# Patient Record
Sex: Female | Born: 1945
Health system: Southern US, Community
[De-identification: ages and names within clinical notes are randomized; demographics above are authoritative.]

## PROBLEM LIST (undated history)

## (undated) DIAGNOSIS — I341 Nonrheumatic mitral (valve) prolapse: Secondary | ICD-10-CM

## (undated) DIAGNOSIS — M069 Rheumatoid arthritis, unspecified: Secondary | ICD-10-CM

## (undated) DIAGNOSIS — B029 Zoster without complications: Secondary | ICD-10-CM

## (undated) DIAGNOSIS — E039 Hypothyroidism, unspecified: Secondary | ICD-10-CM

## (undated) DIAGNOSIS — C9110 Chronic lymphocytic leukemia of B-cell type not having achieved remission: Secondary | ICD-10-CM

## (undated) DIAGNOSIS — I10 Essential (primary) hypertension: Secondary | ICD-10-CM

## (undated) HISTORY — PX: ABDOMINAL HYSTERECTOMY: SHX81

## (undated) HISTORY — DX: Hypothyroidism, unspecified: E03.9

## (undated) HISTORY — DX: Nonrheumatic mitral (valve) prolapse: I34.1

## (undated) HISTORY — PX: TONSILLECTOMY: SUR1361

## (undated) HISTORY — DX: Essential (primary) hypertension: I10

---

## 2010-05-19 DIAGNOSIS — J329 Chronic sinusitis, unspecified: Secondary | ICD-10-CM | POA: Insufficient documentation

## 2011-03-31 DIAGNOSIS — M5430 Sciatica, unspecified side: Secondary | ICD-10-CM | POA: Insufficient documentation

## 2012-01-31 DIAGNOSIS — L304 Erythema intertrigo: Secondary | ICD-10-CM | POA: Insufficient documentation

## 2012-10-14 DIAGNOSIS — R6884 Jaw pain: Secondary | ICD-10-CM | POA: Insufficient documentation

## 2014-03-19 DIAGNOSIS — E782 Mixed hyperlipidemia: Secondary | ICD-10-CM | POA: Diagnosis present

## 2015-09-09 DIAGNOSIS — J8410 Pulmonary fibrosis, unspecified: Secondary | ICD-10-CM | POA: Insufficient documentation

## 2016-08-09 DIAGNOSIS — R7989 Other specified abnormal findings of blood chemistry: Secondary | ICD-10-CM | POA: Insufficient documentation

## 2016-08-16 DIAGNOSIS — K5901 Slow transit constipation: Secondary | ICD-10-CM | POA: Insufficient documentation

## 2017-01-29 DIAGNOSIS — E039 Hypothyroidism, unspecified: Secondary | ICD-10-CM | POA: Diagnosis not present

## 2017-01-29 DIAGNOSIS — M7989 Other specified soft tissue disorders: Secondary | ICD-10-CM | POA: Diagnosis not present

## 2017-01-29 DIAGNOSIS — Z23 Encounter for immunization: Secondary | ICD-10-CM | POA: Diagnosis not present

## 2017-01-29 DIAGNOSIS — M069 Rheumatoid arthritis, unspecified: Secondary | ICD-10-CM | POA: Diagnosis not present

## 2017-02-21 DIAGNOSIS — Z6826 Body mass index (BMI) 26.0-26.9, adult: Secondary | ICD-10-CM | POA: Diagnosis not present

## 2017-02-21 DIAGNOSIS — E663 Overweight: Secondary | ICD-10-CM | POA: Diagnosis not present

## 2017-02-21 DIAGNOSIS — R5383 Other fatigue: Secondary | ICD-10-CM | POA: Diagnosis not present

## 2017-02-21 DIAGNOSIS — M069 Rheumatoid arthritis, unspecified: Secondary | ICD-10-CM | POA: Diagnosis not present

## 2017-02-21 DIAGNOSIS — M7989 Other specified soft tissue disorders: Secondary | ICD-10-CM | POA: Diagnosis not present

## 2017-03-25 DIAGNOSIS — M25561 Pain in right knee: Secondary | ICD-10-CM | POA: Diagnosis not present

## 2017-05-07 DIAGNOSIS — L659 Nonscarring hair loss, unspecified: Secondary | ICD-10-CM | POA: Diagnosis not present

## 2017-05-07 DIAGNOSIS — R5383 Other fatigue: Secondary | ICD-10-CM | POA: Diagnosis not present

## 2017-05-07 DIAGNOSIS — Z6827 Body mass index (BMI) 27.0-27.9, adult: Secondary | ICD-10-CM | POA: Diagnosis not present

## 2017-05-07 DIAGNOSIS — M069 Rheumatoid arthritis, unspecified: Secondary | ICD-10-CM | POA: Diagnosis not present

## 2017-05-07 DIAGNOSIS — Z79899 Other long term (current) drug therapy: Secondary | ICD-10-CM | POA: Diagnosis not present

## 2017-05-07 DIAGNOSIS — E663 Overweight: Secondary | ICD-10-CM | POA: Diagnosis not present

## 2017-07-05 ENCOUNTER — Ambulatory Visit: Payer: PPO | Admitting: Podiatry

## 2017-07-05 ENCOUNTER — Encounter: Payer: Self-pay | Admitting: Podiatry

## 2017-07-05 ENCOUNTER — Ambulatory Visit (INDEPENDENT_AMBULATORY_CARE_PROVIDER_SITE_OTHER): Payer: PPO

## 2017-07-05 DIAGNOSIS — M79671 Pain in right foot: Secondary | ICD-10-CM

## 2017-07-05 DIAGNOSIS — R200 Anesthesia of skin: Secondary | ICD-10-CM | POA: Diagnosis not present

## 2017-07-05 DIAGNOSIS — Q828 Other specified congenital malformations of skin: Secondary | ICD-10-CM | POA: Diagnosis not present

## 2017-07-05 DIAGNOSIS — M2142 Flat foot [pes planus] (acquired), left foot: Secondary | ICD-10-CM | POA: Diagnosis not present

## 2017-07-05 DIAGNOSIS — M79672 Pain in left foot: Secondary | ICD-10-CM | POA: Diagnosis not present

## 2017-07-05 DIAGNOSIS — M2141 Flat foot [pes planus] (acquired), right foot: Secondary | ICD-10-CM

## 2017-07-05 NOTE — Progress Notes (Signed)
Subjective:    Patient ID: Nicole Campbell, female    DOB: 14-Sep-1945, 72 y.o.   MRN: [id number]  HPI 72 year old female presents the office today for multiple concerns.  She states that her main concern is she has numbness to her feet most of the right foot which is been gradually getting worse over the last 10 years.  She also feels that it makes it difficult to walk at times given the numbness.  She also gets calluses for the repeat that is been ongoing for several years.  She has a history of injury to the right ankle about 15 years ago.  She also has a flat foot.  She has a history of rheumatoid arthritis and she is currently on methotrexate.  She denies any recent injury to her feet.  She has no other concerns today.   Review of Systems  History reviewed. No pertinent past medical history.  History reviewed. No pertinent surgical history.   Current Outpatient Medications:  .  folic acid (FOLVITE) 1 MG tablet, Take 1 mg by mouth daily., Disp: , Rfl:  .  levothyroxine (SYNTHROID, LEVOTHROID) 25 MCG tablet, , Disp: , Rfl:  .  methotrexate 2.5 MG tablet, , Disp: , Rfl:   Allergies  Allergen Reactions  . Erythromycin     Social History   Socioeconomic History  . Marital status: Unknown    Spouse name: Not on file  . Number of children: Not on file  . Years of education: Not on file  . Highest education level: Not on file  Social Needs  . Financial resource strain: Not on file  . Food insecurity - worry: Not on file  . Food insecurity - inability: Not on file  . Transportation needs - medical: Not on file  . Transportation needs - non-medical: Not on file  Occupational History  . Not on file  Tobacco Use  . Smoking status: Unknown If Ever Smoked  . Smokeless tobacco: Never Used  Substance and Sexual Activity  . Alcohol use: Not on file  . Drug use: Not on file  . Sexual activity: Not on file  Other Topics Concern  . Not on file  Social History Narrative  . Not on  file         Objective:   Physical Exam General: AAO x3, NAD  Dermatological: Hyperkeratotic lesions present bilateral foot.  Upon debridement there is no underlying ulceration, drainage or any signs of infection.  There are no open sores, no preulcerative lesions, no rash or signs of infection present.  Vascular: Dorsalis Pedis artery and Posterior Tibial artery pedal pulses are 2/4 bilateral with immedate capillary fill time. There is no pain with calf compression, swelling, warmth, erythema.   Neruologic: Grossly intact via light touch bilateral.  Sensation appears to be intact with Ernestina Patches monofilament.  There is a negative Tinel sign present bilaterally.  Musculoskeletal: There is a decrease in medial arch upon weightbearing.  Mild equinus is present.  HAV present. There is no area pinpoint bony tenderness or pain to vibratory sensation.  There is no overlying edema, erythema, increase in warmth.  Achilles tendon appears to be intact as well as the ankle ligaments, extensor, flexor tendons.  Muscular strength 5/5 in all groups tested bilateral.  Gait: Unassisted, Nonantalgic.      Assessment & Plan:  72 year old female with bilateral flatfoot deformity resulted in hyperkeratotic lesions; numbness -Treatment options discussed including all alternatives, risks, and complications -Etiology of symptoms were  discussed -X-rays were obtained and reviewed with the patient.  HAV is present.  Heel spurs are present.  There is no evidence of acute fracture or stress fracture identified today. -Hyperkeratotic lesions sharply debrided without complications or bleeding.  -Regards to her overall foot pain, calluses I discussed with her custom orthotics.  She wishes to go ahead and proceed with these.  I will get her back into being molded for the inserts.  She is aware of the cost. -Regards to the numbness to her toes.  We discussed nerve conduction test.  I do not believe that this is a  circulation issue.  Trula Slade DPM

## 2017-07-13 DIAGNOSIS — H1851 Endothelial corneal dystrophy: Secondary | ICD-10-CM | POA: Diagnosis not present

## 2017-07-13 DIAGNOSIS — H2513 Age-related nuclear cataract, bilateral: Secondary | ICD-10-CM | POA: Diagnosis not present

## 2017-07-13 DIAGNOSIS — H35373 Puckering of macula, bilateral: Secondary | ICD-10-CM | POA: Diagnosis not present

## 2017-07-13 DIAGNOSIS — H01024 Squamous blepharitis left upper eyelid: Secondary | ICD-10-CM | POA: Diagnosis not present

## 2017-07-13 DIAGNOSIS — H04123 Dry eye syndrome of bilateral lacrimal glands: Secondary | ICD-10-CM | POA: Diagnosis not present

## 2017-07-13 DIAGNOSIS — H01021 Squamous blepharitis right upper eyelid: Secondary | ICD-10-CM | POA: Diagnosis not present

## 2017-07-13 DIAGNOSIS — H01022 Squamous blepharitis right lower eyelid: Secondary | ICD-10-CM | POA: Diagnosis not present

## 2017-07-13 DIAGNOSIS — H2511 Age-related nuclear cataract, right eye: Secondary | ICD-10-CM | POA: Diagnosis not present

## 2017-07-13 DIAGNOSIS — H01025 Squamous blepharitis left lower eyelid: Secondary | ICD-10-CM | POA: Diagnosis not present

## 2017-07-16 ENCOUNTER — Ambulatory Visit: Payer: PPO | Admitting: Orthotics

## 2017-07-16 DIAGNOSIS — M2141 Flat foot [pes planus] (acquired), right foot: Secondary | ICD-10-CM

## 2017-07-16 DIAGNOSIS — M2142 Flat foot [pes planus] (acquired), left foot: Secondary | ICD-10-CM

## 2017-07-16 DIAGNOSIS — Q828 Other specified congenital malformations of skin: Secondary | ICD-10-CM

## 2017-07-16 NOTE — Progress Notes (Signed)
Patient came into today to be cast for Custom Foot Orthotics. Upon recommendation of Dr. Jacqualyn Posey Patient presents with painful keratomas 2/5th sub met b/l Goals are lw accomodative w/ offloads Plan vendor Rich[employer]   $300

## 2017-07-20 ENCOUNTER — Other Ambulatory Visit: Payer: Self-pay

## 2017-07-20 NOTE — Patient Outreach (Signed)
[facility]) Care Management  07/20/2017  LILLY GASSER [birth year] [id number]   Telephone Screen  Referral Date: 07/20/17 Referral Source: Nurse Call Center Report Referral Reason: " caller states she is supposed to start taking Prednisone tomorrow for RA and wants to know if okay to take ibuprofen tonight for pain and if there will be any interactions between the two" Insurance: HTA   Outreach attempt # 1  to patient. No answer at present. RN CM left HIPAA compliant voicemail message along with contact info.      Plan: RN CM will make outreach attempt to patient within three business days if no return call from patient. RN CM will send unsuccessful outreach letter to patient.    Enzo Montgomery, RN,BSN,CCM [place] [facility] Telephonic Care Management Coordinator Direct Phone: [phone] Toll Free: [phone] Fax: [phone]

## 2017-07-23 ENCOUNTER — Other Ambulatory Visit: Payer: Self-pay

## 2017-07-23 NOTE — Patient Outreach (Signed)
[facility] Care Management  07/23/2017  PAELYN SMICK December 30, 1945 [id number]   Telephone Screen  Referral Date: 07/20/17 Referral Source: Nurse Call Center Report Referral Reason: " caller states she is supposed to start taking Prednisone tomorrow for RA and wants to know if okay to take ibuprofen tonight for pain and if there will be any interactions between the two" Insurance: HTA    Outreach attempt #2 to patient. No answer at present. RN CM left HIPAA compliant voicemail message along with contact info.      Plan: RN CM will make outreach attempt to patient within 3-4 business days. RN CM will send unsuccessful outreach letter to patient.      Enzo Montgomery, RN,BSN,CCM [place] Management Telephonic Care Management Coordinator Direct Phone: [phone] Toll Free: [phone] Fax: [phone]

## 2017-07-23 NOTE — Patient Outreach (Signed)
[facility]) Care Management  07/23/2017  Nicole Campbell 1946-03-02 [id number]    Telephone Screen  Referral Date:07/20/17 Referral Source:Nurse Call Center Report Referral Reason:" caller states she is supposed to start taking Prednisone tomorrow for RA and wants to know if okay to take ibuprofen tonight for pain and if there will be any interactions between the two" Insurance:HTA    Incoming call from patient. Patient reports she is doing and feeling well. She stets that she is taking Prednisone as directed and feels that it is working. She voices the evening that she called the nurse line she took one dose of Ibuprofen which allowed her to rest well and has not taken any more since starting steroids. She has MD appts in place and transportation to get to them. She denies any other issues regarding her meds.She denies any further RN CM issues or concerns at this time. She was very Patent attorney of nurse line 24/7 being available to her. She is aware that she can call the number for any future needs or concerns.       Plan:  RN CM will close case at this time as no further needs.    Enzo Montgomery, RN,BSN,CCM [place] [facility] Telephonic Care Management Coordinator Direct Phone: [phone] Toll Free: [phone] Fax: [phone]

## 2017-08-06 ENCOUNTER — Ambulatory Visit (INDEPENDENT_AMBULATORY_CARE_PROVIDER_SITE_OTHER): Payer: PPO | Admitting: Orthotics

## 2017-08-06 DIAGNOSIS — Q828 Other specified congenital malformations of skin: Secondary | ICD-10-CM

## 2017-08-06 DIAGNOSIS — L659 Nonscarring hair loss, unspecified: Secondary | ICD-10-CM | POA: Diagnosis not present

## 2017-08-06 DIAGNOSIS — M2141 Flat foot [pes planus] (acquired), right foot: Secondary | ICD-10-CM

## 2017-08-06 DIAGNOSIS — Z79899 Other long term (current) drug therapy: Secondary | ICD-10-CM | POA: Diagnosis not present

## 2017-08-06 DIAGNOSIS — Z6827 Body mass index (BMI) 27.0-27.9, adult: Secondary | ICD-10-CM | POA: Diagnosis not present

## 2017-08-06 DIAGNOSIS — M2142 Flat foot [pes planus] (acquired), left foot: Secondary | ICD-10-CM

## 2017-08-06 DIAGNOSIS — E663 Overweight: Secondary | ICD-10-CM | POA: Diagnosis not present

## 2017-08-06 DIAGNOSIS — R5383 Other fatigue: Secondary | ICD-10-CM | POA: Diagnosis not present

## 2017-08-06 DIAGNOSIS — R200 Anesthesia of skin: Secondary | ICD-10-CM

## 2017-08-06 DIAGNOSIS — M069 Rheumatoid arthritis, unspecified: Secondary | ICD-10-CM | POA: Diagnosis not present

## 2017-08-06 NOTE — Progress Notes (Signed)
Patient came in today to pick up custom made foot orthotics.  The goals were accomplished and the patient reported no dissatisfaction with said orthotics.  Patient was advised of breakin period and how to report any issues. 

## 2017-08-22 DIAGNOSIS — M859 Disorder of bone density and structure, unspecified: Secondary | ICD-10-CM | POA: Diagnosis not present

## 2017-08-22 DIAGNOSIS — Z79899 Other long term (current) drug therapy: Secondary | ICD-10-CM | POA: Diagnosis not present

## 2017-08-22 DIAGNOSIS — Z1389 Encounter for screening for other disorder: Secondary | ICD-10-CM | POA: Diagnosis not present

## 2017-08-22 DIAGNOSIS — M069 Rheumatoid arthritis, unspecified: Secondary | ICD-10-CM | POA: Diagnosis not present

## 2017-08-22 DIAGNOSIS — E039 Hypothyroidism, unspecified: Secondary | ICD-10-CM | POA: Diagnosis not present

## 2017-08-22 DIAGNOSIS — Z0000 Encounter for general adult medical examination without abnormal findings: Secondary | ICD-10-CM | POA: Diagnosis not present

## 2017-08-23 ENCOUNTER — Other Ambulatory Visit: Payer: Self-pay | Admitting: Family Medicine

## 2017-08-23 DIAGNOSIS — Z1231 Encounter for screening mammogram for malignant neoplasm of breast: Secondary | ICD-10-CM

## 2017-08-24 ENCOUNTER — Other Ambulatory Visit: Payer: Self-pay | Admitting: Family Medicine

## 2017-08-28 ENCOUNTER — Other Ambulatory Visit: Payer: Self-pay | Admitting: Family Medicine

## 2017-08-28 DIAGNOSIS — E2839 Other primary ovarian failure: Secondary | ICD-10-CM

## 2017-09-12 DIAGNOSIS — H2511 Age-related nuclear cataract, right eye: Secondary | ICD-10-CM | POA: Diagnosis not present

## 2017-09-12 DIAGNOSIS — H25811 Combined forms of age-related cataract, right eye: Secondary | ICD-10-CM | POA: Diagnosis not present

## 2017-09-17 DIAGNOSIS — M069 Rheumatoid arthritis, unspecified: Secondary | ICD-10-CM | POA: Diagnosis not present

## 2017-09-19 ENCOUNTER — Ambulatory Visit: Payer: PPO

## 2017-10-05 DIAGNOSIS — M069 Rheumatoid arthritis, unspecified: Secondary | ICD-10-CM | POA: Diagnosis not present

## 2017-10-05 DIAGNOSIS — M25512 Pain in left shoulder: Secondary | ICD-10-CM | POA: Diagnosis not present

## 2017-10-05 DIAGNOSIS — L659 Nonscarring hair loss, unspecified: Secondary | ICD-10-CM | POA: Diagnosis not present

## 2017-10-05 DIAGNOSIS — R5383 Other fatigue: Secondary | ICD-10-CM | POA: Diagnosis not present

## 2017-10-05 DIAGNOSIS — Z6827 Body mass index (BMI) 27.0-27.9, adult: Secondary | ICD-10-CM | POA: Diagnosis not present

## 2017-10-05 DIAGNOSIS — Z79899 Other long term (current) drug therapy: Secondary | ICD-10-CM | POA: Diagnosis not present

## 2017-10-05 DIAGNOSIS — E663 Overweight: Secondary | ICD-10-CM | POA: Diagnosis not present

## 2017-10-18 ENCOUNTER — Ambulatory Visit
Admission: RE | Admit: 2017-10-18 | Discharge: 2017-10-18 | Disposition: A | Discharge status: Home / Self Care | Payer: PPO | Source: Ambulatory Visit | Attending: Family Medicine | Admitting: Family Medicine

## 2017-10-18 DIAGNOSIS — Z780 Asymptomatic menopausal state: Secondary | ICD-10-CM | POA: Diagnosis not present

## 2017-10-18 DIAGNOSIS — Z1231 Encounter for screening mammogram for malignant neoplasm of breast: Secondary | ICD-10-CM

## 2017-10-18 DIAGNOSIS — Z13820 Encounter for screening for osteoporosis: Secondary | ICD-10-CM | POA: Diagnosis not present

## 2017-10-18 DIAGNOSIS — E2839 Other primary ovarian failure: Secondary | ICD-10-CM

## 2017-11-02 DIAGNOSIS — H2512 Age-related nuclear cataract, left eye: Secondary | ICD-10-CM | POA: Diagnosis not present

## 2017-11-05 DIAGNOSIS — M069 Rheumatoid arthritis, unspecified: Secondary | ICD-10-CM | POA: Diagnosis not present

## 2017-11-07 DIAGNOSIS — H2512 Age-related nuclear cataract, left eye: Secondary | ICD-10-CM | POA: Diagnosis not present

## 2017-11-07 DIAGNOSIS — H25812 Combined forms of age-related cataract, left eye: Secondary | ICD-10-CM | POA: Diagnosis not present

## 2017-11-12 DIAGNOSIS — M069 Rheumatoid arthritis, unspecified: Secondary | ICD-10-CM | POA: Diagnosis not present

## 2017-12-03 DIAGNOSIS — M25561 Pain in right knee: Secondary | ICD-10-CM | POA: Diagnosis not present

## 2017-12-03 DIAGNOSIS — M25562 Pain in left knee: Secondary | ICD-10-CM | POA: Diagnosis not present

## 2017-12-17 DIAGNOSIS — L659 Nonscarring hair loss, unspecified: Secondary | ICD-10-CM | POA: Diagnosis not present

## 2017-12-17 DIAGNOSIS — R350 Frequency of micturition: Secondary | ICD-10-CM | POA: Diagnosis not present

## 2017-12-17 DIAGNOSIS — M25512 Pain in left shoulder: Secondary | ICD-10-CM | POA: Diagnosis not present

## 2017-12-17 DIAGNOSIS — Z6827 Body mass index (BMI) 27.0-27.9, adult: Secondary | ICD-10-CM | POA: Diagnosis not present

## 2017-12-17 DIAGNOSIS — R5383 Other fatigue: Secondary | ICD-10-CM | POA: Diagnosis not present

## 2017-12-17 DIAGNOSIS — Z79899 Other long term (current) drug therapy: Secondary | ICD-10-CM | POA: Diagnosis not present

## 2017-12-17 DIAGNOSIS — M069 Rheumatoid arthritis, unspecified: Secondary | ICD-10-CM | POA: Diagnosis not present

## 2017-12-17 DIAGNOSIS — E663 Overweight: Secondary | ICD-10-CM | POA: Diagnosis not present

## 2018-01-17 DIAGNOSIS — K648 Other hemorrhoids: Secondary | ICD-10-CM | POA: Diagnosis not present

## 2018-01-28 DIAGNOSIS — M25511 Pain in right shoulder: Secondary | ICD-10-CM | POA: Diagnosis not present

## 2018-01-28 DIAGNOSIS — M25512 Pain in left shoulder: Secondary | ICD-10-CM | POA: Diagnosis not present

## 2018-02-12 DIAGNOSIS — R5383 Other fatigue: Secondary | ICD-10-CM | POA: Diagnosis not present

## 2018-02-12 DIAGNOSIS — R350 Frequency of micturition: Secondary | ICD-10-CM | POA: Diagnosis not present

## 2018-02-12 DIAGNOSIS — M069 Rheumatoid arthritis, unspecified: Secondary | ICD-10-CM | POA: Diagnosis not present

## 2018-02-12 DIAGNOSIS — M25512 Pain in left shoulder: Secondary | ICD-10-CM | POA: Diagnosis not present

## 2018-02-12 DIAGNOSIS — Z79899 Other long term (current) drug therapy: Secondary | ICD-10-CM | POA: Diagnosis not present

## 2018-02-12 DIAGNOSIS — E663 Overweight: Secondary | ICD-10-CM | POA: Diagnosis not present

## 2018-02-12 DIAGNOSIS — Z6827 Body mass index (BMI) 27.0-27.9, adult: Secondary | ICD-10-CM | POA: Diagnosis not present

## 2018-02-12 DIAGNOSIS — L659 Nonscarring hair loss, unspecified: Secondary | ICD-10-CM | POA: Diagnosis not present

## 2018-02-27 DIAGNOSIS — M069 Rheumatoid arthritis, unspecified: Secondary | ICD-10-CM | POA: Diagnosis not present

## 2018-03-07 ENCOUNTER — Telehealth: Payer: Self-pay | Admitting: Oncology

## 2018-03-07 ENCOUNTER — Encounter: Payer: Self-pay | Admitting: Oncology

## 2018-03-07 NOTE — Telephone Encounter (Signed)
New referral received from [facility] Rheumat[facility]. Pt returned my call to schedule an appt. An appt has been scheduled for the pt to see Dr. Alen Blew on 11/22 at 11am. Pt aware to arrive 30 minutes early. Letter mailed.

## 2018-03-18 DIAGNOSIS — E039 Hypothyroidism, unspecified: Secondary | ICD-10-CM | POA: Diagnosis not present

## 2018-03-22 ENCOUNTER — Inpatient Hospital Stay: Payer: PPO | Attending: Oncology | Admitting: Oncology

## 2018-03-22 ENCOUNTER — Inpatient Hospital Stay: Payer: PPO

## 2018-03-22 ENCOUNTER — Telehealth: Payer: Self-pay | Admitting: Oncology

## 2018-03-22 VITALS — BP 163/75 | HR 110 | Temp 97.9°F | Resp 18 | Ht 67.5 in | Wt 171.1 lb

## 2018-03-22 DIAGNOSIS — M069 Rheumatoid arthritis, unspecified: Secondary | ICD-10-CM | POA: Insufficient documentation

## 2018-03-22 DIAGNOSIS — M25512 Pain in left shoulder: Secondary | ICD-10-CM | POA: Diagnosis not present

## 2018-03-22 DIAGNOSIS — D72820 Lymphocytosis (symptomatic): Secondary | ICD-10-CM | POA: Diagnosis not present

## 2018-03-22 DIAGNOSIS — Z806 Family history of leukemia: Secondary | ICD-10-CM | POA: Diagnosis not present

## 2018-03-22 DIAGNOSIS — C91Z0 Other lymphoid leukemia not having achieved remission: Secondary | ICD-10-CM | POA: Diagnosis not present

## 2018-03-22 DIAGNOSIS — C8590 Non-Hodgkin lymphoma, unspecified, unspecified site: Secondary | ICD-10-CM

## 2018-03-22 DIAGNOSIS — E039 Hypothyroidism, unspecified: Secondary | ICD-10-CM | POA: Insufficient documentation

## 2018-03-22 LAB — CBC WITH DIFFERENTIAL (CANCER CENTER ONLY)
Abs Immature Granulocytes: 0.12 10*3/uL — ABNORMAL HIGH (ref 0.00–0.07)
Basophils Absolute: 0.2 10*3/uL — ABNORMAL HIGH (ref 0.0–0.1)
Basophils Relative: 1 %
Eosinophils Absolute: 0.5 10*3/uL (ref 0.0–0.5)
Eosinophils Relative: 3 %
HCT: 41.2 % (ref 36.0–46.0)
Hemoglobin: 13.1 g/dL (ref 12.0–15.0)
Immature Granulocytes: 1 %
Lymphocytes Relative: 67 %
Lymphs Abs: 12.3 10*3/uL — ABNORMAL HIGH (ref 0.7–4.0)
MCH: 29.6 pg (ref 26.0–34.0)
MCHC: 31.8 g/dL (ref 30.0–36.0)
MCV: 93 fL (ref 80.0–100.0)
Monocytes Absolute: 1 10*3/uL (ref 0.1–1.0)
Monocytes Relative: 5 %
Neutro Abs: 4.1 10*3/uL (ref 1.7–7.7)
Neutrophils Relative %: 23 %
Platelet Count: 285 10*3/uL (ref 150–400)
RBC: 4.43 MIL/uL (ref 3.87–5.11)
RDW: 14.1 % (ref 11.5–15.5)
WBC Count: 18.1 10*3/uL — ABNORMAL HIGH (ref 4.0–10.5)
nRBC: 0 % (ref 0.0–0.2)

## 2018-03-22 LAB — LACTATE DEHYDROGENASE: LDH: 319 U/L — ABNORMAL HIGH (ref 98–192)

## 2018-03-22 LAB — CMP (CANCER CENTER ONLY)
ALT: 25 U/L (ref 0–44)
AST: 25 U/L (ref 15–41)
Albumin: 3.8 g/dL (ref 3.5–5.0)
Alkaline Phosphatase: 85 U/L (ref 38–126)
Anion gap: 10 (ref 5–15)
BUN: 10 mg/dL (ref 8–23)
CO2: 23 mmol/L (ref 22–32)
Calcium: 9.6 mg/dL (ref 8.9–10.3)
Chloride: 107 mmol/L (ref 98–111)
Creatinine: 0.78 mg/dL (ref 0.44–1.00)
GFR, Est AFR Am: >60 mL/min (ref >60)
GFR, Estimated: >60 mL/min (ref >60)
Glucose, Bld: 99 mg/dL (ref 70–99)
Potassium: 3.8 mmol/L (ref 3.5–5.1)
Sodium: 140 mmol/L (ref 135–145)
Total Bilirubin: 0.5 mg/dL (ref 0.3–1.2)
Total Protein: 7.1 g/dL (ref 6.5–8.1)

## 2018-03-22 NOTE — Telephone Encounter (Signed)
Gave pt avs and calendar  °

## 2018-03-22 NOTE — Progress Notes (Signed)
Reason for the request:   Lymphocytosis.  Evaluation for lymphoproliferative disorder.  HPI: I was asked by Marella Chimes, PA-C  to evaluate Nicole Campbell for lymphocytosis.  She is a pleasant 72 year old woman with history of rheumatoid arthritis diagnosed for the last 3 years.  He had few therapies in the past including methotrexate and prednisone.  He establish care after relocating from [facility] with [facility] rheumatology.  She was started on leflunomide around June 2019 and added Olumiant in August 2019.  He had routine labs done throughout this which showed gradual lymphocytosis.  In June 2019 her total white cell count was 10.8 but did have lymphocytosis with 50% lymphs and absolute lymphocyte count of 5.5.  Her total white cell count has increased in the interim with absolute lymphocytosis is up to 16.3.  Her white cell count in July 2019 was 11.6, August 2019 was 17.4, on February 13, 2018 and was 20.7 and on February 28, 2018 was 21.8.  Based on these findings patient referred to me for evaluation.  Remaining laboratory testing was within normal range including normal hemoglobin, platelet count and electrolytes.  With these findings she is completely asymptomatic.  She denies any lymphadenopathy, constitutional symptoms or weight loss.  Her appetite and performance status is excellent.  She continues to have left-sided shoulder pain as well as chronic arthritic pain which has improved with her rheumatoid arthritis medication.  She does not report any headaches, blurry vision, syncope or seizures. Does not report any fevers, chills or sweats.  Does not report any cough, wheezing or hemoptysis.  Does not report any chest pain, palpitation, orthopnea or leg edema.  Does not report any nausea, vomiting or abdominal pain.  Does not report any constipation or diarrhea.  Does not report any skeletal complaints.    Does not report frequency, urgency or hematuria.  Does not report any skin rashes or lesions.  Does not report any heat or cold intolerance.  Does not report any lymphadenopathy or petechiae.  Does not report any anxiety or depression.  Remaining review of systems is negative.    No past medical history on file.:    Past medical history is significant for hypothyroidism and rheumatoid arthritis.  No hypertension, diabetes or coronary disease.   Current Outpatient Medications:  .  levothyroxine (SYNTHROID, LEVOTHROID) 25 MCG tablet, , Disp: , Rfl:  .  PROCTOZONE-HC 2.5 % rectal cream, APPLY CREAM RECTALLY TO AFFECTED AREA THREE TIMES DAILY, Disp: , Rfl: 2:  Allergies  Allergen Reactions  . Erythromycin   :   Her brother was diagnosed with CLL recently.  Social History   Socioeconomic History  . Marital status: Married    Spouse name: Not on file  . Number of children: Not on file  . Years of education: Not on file  . Highest education level: Not on file  Occupational History  . Not on file  Social Needs  . Financial resource strain: Not on file  . Food insecurity:    Worry: Not on file    Inability: Not on file  . Transportation needs:    Medical: Not on file    Non-medical: Not on file  Tobacco Use  . Smoking status: Unknown If Ever Smoked  . Smokeless tobacco: Never Used  Substance and Sexual Activity  . Alcohol use: Not on file  . Drug use: Not on file  . Sexual activity: Not on file  Lifestyle  . Physical activity:    Days per week: Not  on file    Minutes per session: Not on file  . Stress: Not on file  Relationships  . Social connections:    Talks on phone: Not on file    Gets together: Not on file    Attends religious service: Not on file    Active member of club or organization: Not on file    Attends meetings of clubs or organizations: Not on file    Relationship status: Not on file  . Intimate partner violence:    Fear of current or ex partner: Not on file    Emotionally abused: Not on file    Physically abused: Not on file    Forced sexual  activity: Not on file  Other Topics Concern  . Not on file  Social History Narrative  . Not on file  :  Pertinent items are noted in HPI.  Exam: Blood pressure (!) 163/75, pulse (!) 110, temperature 97.9 F (36.6 C), temperature source Oral, resp. rate 18, height 5' 7.5" (1.715 m), weight 171 lb 1.6 oz (77.6 kg), SpO2 98 %.  ECOG 1 General appearance: alert and cooperative appeared without distress. Head: atraumatic without any abnormalities. Eyes: conjunctivae/corneas clear. PERRL.  Sclera anicteric. Throat: lips, mucosa, and tongue normal; without oral thrush or ulcers. Resp: clear to auscultation bilaterally without rhonchi, wheezes or dullness to percussion. Cardio: regular rate and rhythm, S1, S2 normal, no murmur, click, rub or gallop GI: soft, non-tender; bowel sounds normal; no masses,  no organomegaly Skin: Skin color, texture, turgor normal. No rashes or lesions Lymph nodes: Cervical, supraclavicular, and axillary nodes normal. Neurologic: Grossly normal without any motor, sensory or deep tendon reflexes. Musculoskeletal: No joint deformity or effusion.     Assessment and Plan:   72 year old woman with the following:  1.  Lymphocytosis documented starting in June 2019.  Her lymphocyte count was noted to be around 5.5 with the upper limit of normal close to 3.  Her absolute lymphocyte count is a high as 20 in October 2019.  Her white cell count started within normal range and most recently was up to 20,000.  The differential diagnosis was discussed today with the patient today.  Lymphoproliferative disorder is highly likely in this scenario.  Chronic lymphocytic leukemia is the most likely etiology although other possibilities are considered.  These possibilities include mantle cell lymphoma, prolymphocytic leukemia among others.  Reactive lymphocytosis related to drugs are considered less likely at this time.  From a management and work-up standpoint, I will repeat CBC  today as well as peripheral blood flow cytometry.  We will also obtain FISH studies to further risk stratify her CLL if it is indeed as we are dealing with.  I will also obtain imaging studies of the chest abdomen and pelvis to rule out lymphatic spread and lymphoma presentation of her CLL.  Once the studies are completed she will return to me for final diagnosis and management discussion.  The natural course of CLL was reviewed today as the most likely etiology.  Long-term complication associated with this disease including cytopenias, immune dysregulation as well as current infections were reviewed.  Treatment options that include systemic chemotherapy, oral targeted therapy among others were discussed.  We will discuss this further once the diagnosis and the risk stratification has been completed.  2.  Rheumatoid arthritis: She is currently off therapy.  Once the final diagnosis of her hematological condition is characterized I will give a final recommendation regarding this issue.  3.  Follow-up:  We will be in the immediate future after completing the work-up.    Thank you for the referral.  A copy of this consult has been forwarded to the requesting physician.

## 2018-03-26 ENCOUNTER — Encounter (HOSPITAL_COMMUNITY): Payer: Self-pay

## 2018-03-26 ENCOUNTER — Ambulatory Visit (HOSPITAL_COMMUNITY)
Admission: RE | Admit: 2018-03-26 | Discharge: 2018-03-26 | Disposition: A | Discharge status: Home / Self Care | Payer: PPO | Source: Ambulatory Visit | Attending: Oncology | Admitting: Oncology

## 2018-03-26 DIAGNOSIS — C8590 Non-Hodgkin lymphoma, unspecified, unspecified site: Secondary | ICD-10-CM | POA: Diagnosis not present

## 2018-03-26 DIAGNOSIS — D72820 Lymphocytosis (symptomatic): Secondary | ICD-10-CM | POA: Diagnosis not present

## 2018-03-26 MED ORDER — SODIUM CHLORIDE (PF) 0.9 % IJ SOLN
INTRAMUSCULAR | Status: AC
  Filled 2018-03-26: qty 50

## 2018-03-26 MED ORDER — IOHEXOL 300 MG/ML  SOLN
100.0000 mL | Freq: Once | INTRAMUSCULAR | Status: AC | PRN
  Administered 2018-03-26: 100 mL via INTRAVENOUS

## 2018-03-27 LAB — FLOW CYTOMETRY

## 2018-04-02 DIAGNOSIS — Z23 Encounter for immunization: Secondary | ICD-10-CM | POA: Diagnosis not present

## 2018-04-04 ENCOUNTER — Inpatient Hospital Stay: Payer: PPO | Attending: Oncology | Admitting: Oncology

## 2018-04-04 VITALS — BP 151/77 | HR 103 | Temp 97.6°F | Resp 18 | Ht 67.5 in | Wt 174.7 lb

## 2018-04-04 DIAGNOSIS — M069 Rheumatoid arthritis, unspecified: Secondary | ICD-10-CM

## 2018-04-04 DIAGNOSIS — C9110 Chronic lymphocytic leukemia of B-cell type not having achieved remission: Secondary | ICD-10-CM

## 2018-04-04 NOTE — Progress Notes (Signed)
Hematology and Oncology Follow Up Visit  Nicole Campbell [id number] November 25, 1945 72 y.o. 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  She presented with lymphocytosis and lymphadenopathy that is asymptomatic.  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   Current therapy: Active surveillance.  Interim History: Nicole Campbell presents today for a follow-up visit.  She is a pleasant woman I saw in evaluation for lymphocytosis on 03/22/2018.  Her work-up confirmed the presence of CLL although she remains asymptomatic.  Since the last visit, she has not reported any palpable adenopathy, constitutional symptoms or recurrent infections.  She remains active and continues to attend activities of daily living.  She has not had any exacerbation of her rheumatoid arthritis.  She does not report any headaches, blurry vision, syncope or seizures. Does not report any fevers, chills or sweats.  Does not report any cough, wheezing or hemoptysis.  Does not report any chest pain, palpitation, orthopnea or leg edema.  Does not report any nausea, vomiting or abdominal pain.  Does not report any constipation or diarrhea.   Does not report frequency, urgency or hematuria.  Does not report any skin rashes or lesions.  Does not report any lymphadenopathy or petechiae.  Does not report any anxiety or depression.  Remaining review of systems is negative.    Medications: I have reviewed the patient's current medications.  Current Outpatient Medications  Medication Sig Dispense Refill  . levothyroxine (SYNTHROID, LEVOTHROID) 25 MCG tablet     . PROCTOZONE-HC 2.5 % rectal cream APPLY CREAM RECTALLY TO AFFECTED AREA THREE TIMES DAILY  2   No current facility-administered medications for this visit.      Allergies:  Allergies  Allergen Reactions  . Erythromycin     Past Medical History, Surgical history, Social  history, and Family History were reviewed and updated.  Review of Systems:  Remaining ROS negative.  Physical Exam: Blood pressure (!) 151/77, pulse (!) 103, temperature 97.6 F (36.4 C), temperature source Oral, resp. rate 18, height 5' 7.5" (1.715 m), weight 174 lb 11.2 oz (79.2 kg), SpO2 99 %. ECOG:1  General appearance: alert and cooperative appeared without distress. Head: Normocephalic, without obvious abnormality Oropharynx: No oral thrush or ulcers. Eyes: No scleral icterus.  Pupils are equal and round reactive to light. Lymph nodes: Cervical, supraclavicular, and axillary nodes normal. Heart:regular rate and rhythm, S1, S2 normal, no murmur, click, rub or gallop Lung:chest clear, no wheezing, rales, normal symmetric air entry Abdomin: soft, non-tender, without masses or organomegaly. Neurological: No motor, sensory deficits.  Intact deep tendon reflexes. Skin: No rashes or lesions.  No ecchymosis or petechiae. Musculoskeletal: No joint deformity or effusion. Psychiatric: Mood and affect are appropriate.    Lab Results: Lab Results  Component Value Date   WBC 18.1 (H) 03/22/2018   HGB 13.1 03/22/2018   HCT 41.2 03/22/2018   MCV 93.0 03/22/2018   PLT 285 03/22/2018     Chemistry      Component Value Date/Time   NA 140 03/22/2018 1104   K 3.8 03/22/2018 1104   CL 107 03/22/2018 1104   CO2 23 03/22/2018 1104   BUN 10 03/22/2018 1104   CREATININE 0.78 03/22/2018 1104      Component Value Date/Time   CALCIUM 9.6 03/22/2018 1104   ALKPHOS 85 03/22/2018 1104   AST 25 03/22/2018 1104   ALT 25 03/22/2018 1104   BILITOT 0.5 03/22/2018  1104       Radiological Studies: CLINICAL DATA:  Lymphocytosis. Lymphoproliferative disorder is suspected, CLL favored. Initial staging.  EXAM: CT CHEST, ABDOMEN, AND PELVIS WITH CONTRAST  TECHNIQUE: Multidetector CT imaging of the chest, abdomen and pelvis was performed following the standard protocol during  bolus administration of intravenous contrast.  CONTRAST:  161m OMNIPAQUE IOHEXOL 300 MG/ML  SOLN  COMPARISON:  None.  FINDINGS: CT CHEST FINDINGS  Cardiovascular: Normal heart size. No significant pericardial effusion/thickening. Mildly atherosclerotic nonaneurysmal thoracic aorta. Normal caliber pulmonary arteries. No central pulmonary emboli.  Mediastinum/Nodes: No discrete thyroid nodules. Unremarkable esophagus. Symmetric mild bilateral axillary adenopathy measuring up to 1.0 cm on the right (series 2/image 14) and 1.1 cm on the left (series 2/image 17). Mild bilateral subpectoral adenopathy measuring up to 1.0 cm on the right (series 2/image 14) and 1.0 cm on the left (series 2/image 7). Mildly enlarged 1.3 cm right lower paratracheal node (series 2/image 24). No pathologically enlarged hilar nodes.  Lungs/Pleura: No pneumothorax. No pleural effusion. No acute consolidative airspace disease, lung masses or significant pulmonary nodules.  Musculoskeletal: Small non expansile lytic lesion in right T9 pedicle (series 5/image 69). No additional focal osseous lesions. Subchondral cystic changes in the bilateral glenoid, partially visualized, probably degenerative. Mild thoracic spondylosis.  CT ABDOMEN PELVIS FINDINGS  Hepatobiliary: Normal liver with no liver mass. Normal gallbladder with no radiopaque cholelithiasis. No biliary ductal dilatation.  Pancreas: Normal, with no mass or duct dilation.  Spleen: Normal size. No mass.  Adrenals/Urinary Tract: Normal adrenals. Subcentimeter hypodense upper right renal cortical lesion, too small to characterize, which requires no follow-up. Otherwise normal kidneys, with no hydronephrosis. Normal bladder.  Stomach/Bowel: Normal non-distended stomach. Normal caliber small bowel with no small bowel wall thickening. Normal appendix. Normal large bowel with no diverticulosis, large bowel wall thickening  or pericolonic fat stranding.  Vascular/Lymphatic: Atherosclerotic nonaneurysmal abdominal aorta. Patent portal, splenic, hepatic and renal veins. Mildly enlarged 1.7 cm portacaval node (series 2/image 62). Mild aortocaval adenopathy measuring up to 1.1 cm (series 2/image 70). Mildly enlarged 1.0 cm right external iliac node (series 2/image 109).  Reproductive: Status post hysterectomy, with no abnormal findings at the vaginal cuff. No adnexal mass.  Other: No pneumoperitoneum, ascites or focal fluid collection.  Musculoskeletal: No aggressive appearing focal osseous lesions. 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 Moderate lumbar spondylosis.  IMPRESSION: 1. Mild bilateral axillary, bilateral subpectoral, mediastinal, retroperitoneal and right pelvic lymphadenopathy, compatible with lymphoproliferative disorder. 2. Spleen is normal size. 3. Nonspecific small non expansile right T9 pedicle lytic lesion. 4.  Aortic Atherosclerosis (ICD10-I70.0).    Impression and Plan:   72year old woman with the following:  1.  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  She presented with lymphocytosis and asymptomatic lymphadenopathy.  Her white cell count was 18,000 with absolute lymphocyte count of 12.3.  CT scan of the chest abdomen and pelvis obtained on 03/26/2018 was personally reviewed and discussed with the patient today.  Her imaging studies showed no bulky adenopathy or splenomegaly.  Her CLL did harbor p53 and ATM mutation.  She will course of this disease was discussed today again with the patient in detail.  We have also discussed treatment options as well as indication for treatment.  At this time she does not meet criteria for symptomatic CLL that requires treatment.  This will change if she develops any constitutional symptoms, bulky adenopathy, cytopenias, rapid increase in her lymphocyte count with a rapid doubling time.  She does harbor a mutation  that could  indicate an aggressive disease and therapy may be needed sooner rather than later.  In her case, I would prefer to proceed with ibrutinib when therapy is needed given her p53 mutation.  At this time however, we will proceed with active surveillance only.  2.  Rheumatoid arthritis: I have no objections to restarting her rheumatoid arthritis treatment if needed.   3.  Follow-up: We will be in 3 months to follow her progress.  She will have a physical examination at that time and she will bring her laboratory testing which was done routinely with her rheumatologist.  25  minutes was spent with the patient face-to-face today.  More than 50% of time was dedicated to reviewing her disease status, treatment options and indication for treatment.Zola Button, MD 12/5/20193:55 PM

## 2018-04-09 LAB — FISH,CLL PROGNOSTIC PANEL

## 2018-04-18 DIAGNOSIS — E663 Overweight: Secondary | ICD-10-CM | POA: Diagnosis not present

## 2018-04-18 DIAGNOSIS — L659 Nonscarring hair loss, unspecified: Secondary | ICD-10-CM | POA: Diagnosis not present

## 2018-04-18 DIAGNOSIS — R5383 Other fatigue: Secondary | ICD-10-CM | POA: Diagnosis not present

## 2018-04-18 DIAGNOSIS — M25512 Pain in left shoulder: Secondary | ICD-10-CM | POA: Diagnosis not present

## 2018-04-18 DIAGNOSIS — R350 Frequency of micturition: Secondary | ICD-10-CM | POA: Diagnosis not present

## 2018-04-18 DIAGNOSIS — Z6827 Body mass index (BMI) 27.0-27.9, adult: Secondary | ICD-10-CM | POA: Diagnosis not present

## 2018-04-18 DIAGNOSIS — C9110 Chronic lymphocytic leukemia of B-cell type not having achieved remission: Secondary | ICD-10-CM | POA: Diagnosis not present

## 2018-04-18 DIAGNOSIS — M069 Rheumatoid arthritis, unspecified: Secondary | ICD-10-CM | POA: Diagnosis not present

## 2018-04-18 DIAGNOSIS — Z79899 Other long term (current) drug therapy: Secondary | ICD-10-CM | POA: Diagnosis not present

## 2018-05-02 ENCOUNTER — Telehealth: Payer: Self-pay | Admitting: *Deleted

## 2018-05-02 NOTE — Telephone Encounter (Signed)
Patient states her PA Marella Chimes need an okay from dr Alen Blew to start new RA medication. Faxed last office visit note, with needed info, to [place]

## 2018-05-28 DIAGNOSIS — K648 Other hemorrhoids: Secondary | ICD-10-CM | POA: Diagnosis not present

## 2018-06-07 DIAGNOSIS — M25561 Pain in right knee: Secondary | ICD-10-CM | POA: Diagnosis not present

## 2018-06-07 DIAGNOSIS — M25562 Pain in left knee: Secondary | ICD-10-CM | POA: Diagnosis not present

## 2018-06-18 DIAGNOSIS — M069 Rheumatoid arthritis, unspecified: Secondary | ICD-10-CM | POA: Diagnosis not present

## 2018-06-18 DIAGNOSIS — R5383 Other fatigue: Secondary | ICD-10-CM | POA: Diagnosis not present

## 2018-06-18 DIAGNOSIS — E663 Overweight: Secondary | ICD-10-CM | POA: Diagnosis not present

## 2018-06-18 DIAGNOSIS — L659 Nonscarring hair loss, unspecified: Secondary | ICD-10-CM | POA: Diagnosis not present

## 2018-06-18 DIAGNOSIS — R350 Frequency of micturition: Secondary | ICD-10-CM | POA: Diagnosis not present

## 2018-06-18 DIAGNOSIS — M25512 Pain in left shoulder: Secondary | ICD-10-CM | POA: Diagnosis not present

## 2018-06-18 DIAGNOSIS — C9110 Chronic lymphocytic leukemia of B-cell type not having achieved remission: Secondary | ICD-10-CM | POA: Diagnosis not present

## 2018-06-18 DIAGNOSIS — Z79899 Other long term (current) drug therapy: Secondary | ICD-10-CM | POA: Diagnosis not present

## 2018-06-18 DIAGNOSIS — Z6827 Body mass index (BMI) 27.0-27.9, adult: Secondary | ICD-10-CM | POA: Diagnosis not present

## 2018-06-20 ENCOUNTER — Inpatient Hospital Stay: Payer: PPO | Attending: Oncology | Admitting: Oncology

## 2018-06-20 ENCOUNTER — Telehealth: Payer: Self-pay | Admitting: Oncology

## 2018-06-20 VITALS — BP 152/79 | HR 115 | Temp 97.9°F | Resp 18 | Ht 67.5 in | Wt 177.0 lb

## 2018-06-20 DIAGNOSIS — M25461 Effusion, right knee: Secondary | ICD-10-CM | POA: Diagnosis not present

## 2018-06-20 DIAGNOSIS — M069 Rheumatoid arthritis, unspecified: Secondary | ICD-10-CM | POA: Diagnosis not present

## 2018-06-20 DIAGNOSIS — M25441 Effusion, right hand: Secondary | ICD-10-CM | POA: Diagnosis not present

## 2018-06-20 DIAGNOSIS — M25462 Effusion, left knee: Secondary | ICD-10-CM | POA: Diagnosis not present

## 2018-06-20 DIAGNOSIS — C9110 Chronic lymphocytic leukemia of B-cell type not having achieved remission: Secondary | ICD-10-CM | POA: Diagnosis not present

## 2018-06-20 DIAGNOSIS — M25442 Effusion, left hand: Secondary | ICD-10-CM | POA: Diagnosis not present

## 2018-06-20 NOTE — Progress Notes (Signed)
Hematology and Oncology Follow Up Visit  Nicole Campbell [id number] 1945/12/07 73 y.o. 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   Current therapy: Active surveillance.  Interim History: Nicole Campbell returns today for a follow-up visit.  Since her last visit, continues to have issues with rheumatoid arthritis exacerbation and currently on prednisone only to treat her exacerbation.  She is currently on 30 mg which she will be taken for 3 days and subsequently will be tapered down to 10 mg.  She denies any other constitutional symptoms including fevers or recurrent infections.  She denies any painful adenopathy.  He does have joint swelling predominantly in the knees and hands which has improved with prednisone.  Patient denied any alteration mental status, neuropathy, confusion or dizziness.  Denies any headaches or lethargy.  Denies any night sweats, weight loss or changes in appetite.  Denied orthopnea, dyspnea on exertion or chest discomfort.  Denies shortness of breath, difficulty breathing hemoptysis or cough.  Denies any abdominal distention, nausea, early satiety or dyspepsia.  Denies any hematuria, frequency, dysuria or nocturia.  Denies any skin irritation, dryness or rash.  Denies any ecchymosis or petechiae.  Denies any lymphadenopathy or clotting.  Denies any heat or cold intolerance.  Denies any anxiety or depression.  Remaining review of system is negative.       Medications: I have reviewed the patient's current medications.  Current Outpatient Medications  Medication Sig Dispense Refill  . levothyroxine (SYNTHROID, LEVOTHROID) 25 MCG tablet     . PROCTOZONE-HC 2.5 % rectal cream APPLY CREAM RECTALLY TO AFFECTED AREA THREE TIMES DAILY  2   No current facility-administered medications for this visit.       Allergies:  Allergies  Allergen Reactions  . Erythromycin     Past Medical History, Surgical history, Social history, and Family History were reviewed and updated.    Physical Exam: Blood pressure (!) 152/79, pulse (!) 115, temperature 97.9 F (36.6 C), temperature source Oral, resp. rate 18, height 5' 7.5" (1.715 m), weight 177 lb (80.3 kg), SpO2 98 %. ECOG:1    General appearance: Comfortable appearing without any discomfort Head: Normocephalic without any trauma Oropharynx: Mucous membranes are moist and pink without any thrush or ulcers. Eyes: Pupils are equal and round reactive to light. Lymph nodes: No cervical, supraclavicular, inguinal or axillary lymphadenopathy.   Heart:regular rate and rhythm.  S1 and S2 without leg edema. Lung: Clear without any rhonchi or wheezes.  No dullness to percussion. Abdomin: Soft, nontender, nondistended with good bowel sounds.  No hepatosplenomegaly. Musculoskeletal: No joint deformity or effusion.  Full range of motion noted. Neurological: No deficits noted on motor, sensory and deep tendon reflex exam. Skin: No petechial rash or dryness.  Appeared moist.      Lab Results: Lab Results  Component Value Date   WBC 18.1 (H) 03/22/2018   HGB 13.1 03/22/2018   HCT 41.2 03/22/2018   MCV 93.0 03/22/2018   PLT 285 03/22/2018     Chemistry      Component Value Date/Time   NA 140 03/22/2018 1104   K 3.8 03/22/2018 1104   CL 107 03/22/2018 1104   CO2 23 03/22/2018 1104   BUN 10 03/22/2018 1104   CREATININE 0.78 03/22/2018 1104      Component Value Date/Time   CALCIUM 9.6 03/22/2018 1104   ALKPHOS 85 03/22/2018  1104   AST 25 03/22/2018 1104   ALT 25 03/22/2018 1104   BILITOT 0.5 03/22/2018 1104        IMPRESSION: 1. Mild bilateral axillary, bilateral subpectoral, mediastinal, retroperitoneal and right pelvic lymphadenopathy, compatible with lymphoproliferative disorder. 2. Spleen is normal size. 3. Nonspecific  small non expansile right T9 pedicle lytic lesion. 4.  Aortic Atherosclerosis (ICD10-I70.0).    Impression and Plan:   73 year old woman with the following:  1.  CLL presented with lymphocytosis and mild adenopathy with p53 and ATM mutation.    The natural course of her disease was discussed today.  Treatment options were also reviewed as well as indication for treatment.  She continues to be asymptomatic at this time without any indication for treatment.  Treatment options would include ibrutinib, rituximab as well as systemic chemotherapy.  At this time my preference is to proceed with ibrutinib when indication for treatment is needed.   2.  Rheumatoid arthritis: She had recent exacerbation related to rheumatoid arthritis and currently on prednisone.  She is contemplating rituximab treatment under the care of her rheumatologist.  She requested her treatment to be completed at the cancer center given her diagnosis of CLL.  Rationale for using rituximab for both CLL and rheumatoid arthritis was discussed.  Complication associated with this therapy was reviewed today in detail.  These would include nausea, fatigue as well as infusion related complications.  Tumor lysis would be considered less likely given her low tumor burden.    I will communicate with her rheumatologist in the near future and this will be started once agreed upon.  3.  Follow-up: We will be in 3 months to follow her progress.    25  minutes was spent with the patient face-to-face today.  More than 50% of time was dedicated to dedicated to reviewing her disease status, treatment options, complications related to biological therapy.   Zola Button, MD 2/20/20203:43 PM

## 2018-06-20 NOTE — Telephone Encounter (Signed)
Gave avs and calendar ° °

## 2018-06-20 NOTE — Addendum Note (Signed)
Addended by: Scot Dock on: 06/20/2018 04:03 PM   Modules accepted: Orders

## 2018-06-27 DIAGNOSIS — D171 Benign lipomatous neoplasm of skin and subcutaneous tissue of trunk: Secondary | ICD-10-CM | POA: Diagnosis not present

## 2018-07-01 ENCOUNTER — Other Ambulatory Visit: Payer: Self-pay | Admitting: Oncology

## 2018-07-01 NOTE — Progress Notes (Signed)
Laboratory data obtained on June 18, 2018 were reviewed which showed increased lymphocytosis that is rapidly rising.  Still no indication to treat her CLL but likely she will require treatment sooner rather than later.  I have attempted to speak with her rheumatologist to coordinate rituximab infusion to treat her immune disease as well as CLL at the same time.  Multiple calls to Harrel Carina, PA-C were made and are unsuccessful.  I will continue to reach out to coordinate her rituximab treatment.  The patient was updated about this point via phone today.

## 2018-07-02 ENCOUNTER — Other Ambulatory Visit: Payer: Self-pay | Admitting: Family Medicine

## 2018-07-02 DIAGNOSIS — D171 Benign lipomatous neoplasm of skin and subcutaneous tissue of trunk: Secondary | ICD-10-CM

## 2018-07-08 DIAGNOSIS — J329 Chronic sinusitis, unspecified: Secondary | ICD-10-CM | POA: Diagnosis not present

## 2018-07-10 ENCOUNTER — Other Ambulatory Visit: Payer: Self-pay | Admitting: Oncology

## 2018-07-10 DIAGNOSIS — C9110 Chronic lymphocytic leukemia of B-cell type not having achieved remission: Secondary | ICD-10-CM | POA: Insufficient documentation

## 2018-07-10 NOTE — Progress Notes (Signed)
START OFF PATHWAY REGIMEN - Lymphoma and CLL   [id number]:Rituximab (Weekly):   Administer weekly:     Rituximab   **Always confirm dose/schedule in your pharmacy ordering system**  Patient Characteristics: Chronic Lymphocytic Leukemia (CLL), First Line, Treatment Indicated, Not a Candidate for BTK Inhibitor, 17p del (-) or Unknown, Fit Patient, Age ? 65 Disease Type: Chronic Lymphocytic Leukemia (CLL) Disease Type: Not Applicable Disease Type: Not Applicable Line of Therapy: First Line RAI Stage: II Treatment Indicated<= Treatment Indicated 17p Deletion Status: Negative Fit or Frail Patient<= Fit Patient Patient Age: Age ? 17 Intent of Therapy: Curative Intent, Discussed with Patient

## 2018-07-11 ENCOUNTER — Telehealth: Payer: Self-pay | Admitting: Oncology

## 2018-07-11 NOTE — Telephone Encounter (Signed)
Scheduled appt per 3/11 sch message - sent reminder letter in the mail and left message for patient

## 2018-07-16 ENCOUNTER — Other Ambulatory Visit: Payer: Self-pay

## 2018-07-17 ENCOUNTER — Other Ambulatory Visit: Payer: Self-pay

## 2018-07-17 ENCOUNTER — Inpatient Hospital Stay: Payer: PPO | Attending: Oncology

## 2018-07-17 DIAGNOSIS — C9110 Chronic lymphocytic leukemia of B-cell type not having achieved remission: Secondary | ICD-10-CM | POA: Insufficient documentation

## 2018-07-17 DIAGNOSIS — M069 Rheumatoid arthritis, unspecified: Secondary | ICD-10-CM | POA: Insufficient documentation

## 2018-07-19 ENCOUNTER — Ambulatory Visit
Admission: RE | Admit: 2018-07-19 | Discharge: 2018-07-19 | Disposition: A | Discharge status: Home / Self Care | Payer: PPO | Source: Ambulatory Visit | Attending: Family Medicine | Admitting: Family Medicine

## 2018-07-19 DIAGNOSIS — D171 Benign lipomatous neoplasm of skin and subcutaneous tissue of trunk: Secondary | ICD-10-CM

## 2018-07-23 ENCOUNTER — Encounter: Payer: Self-pay | Admitting: Oncology

## 2018-07-23 NOTE — Progress Notes (Signed)
Met w/ pt on 07/17/18 to introduce myself as her Arboriculturist and to discuss copay assistance.  Pt gave me consent to apply in her behalf so I applied to [employer] and she was approved to receive assistance up to $8,000 toassist w/ins premiums and/orqualifying treatment expenseseffective 3/1/20to 06/29/19.  Pt is overqualified for the [employer].  She has my card for any questions or concerns she may have in the future.

## 2018-07-29 ENCOUNTER — Inpatient Hospital Stay: Payer: PPO

## 2018-07-29 ENCOUNTER — Other Ambulatory Visit: Payer: Self-pay

## 2018-07-29 ENCOUNTER — Inpatient Hospital Stay (HOSPITAL_BASED_OUTPATIENT_CLINIC_OR_DEPARTMENT_OTHER): Payer: PPO | Admitting: Oncology

## 2018-07-29 VITALS — BP 144/66 | HR 96 | Temp 98.4°F | Resp 18 | Ht 67.5 in | Wt 178.2 lb

## 2018-07-29 DIAGNOSIS — M069 Rheumatoid arthritis, unspecified: Secondary | ICD-10-CM | POA: Diagnosis not present

## 2018-07-29 DIAGNOSIS — C9110 Chronic lymphocytic leukemia of B-cell type not having achieved remission: Secondary | ICD-10-CM | POA: Diagnosis not present

## 2018-07-29 LAB — CBC WITH DIFFERENTIAL (CANCER CENTER ONLY)
Abs Immature Granulocytes: 0.4 10*3/uL — ABNORMAL HIGH (ref 0.00–0.07)
Basophils Absolute: 0.2 10*3/uL — ABNORMAL HIGH (ref 0.0–0.1)
Basophils Relative: 1 %
Eosinophils Absolute: 0.3 10*3/uL (ref 0.0–0.5)
Eosinophils Relative: 1 %
HCT: 38.9 % (ref 36.0–46.0)
Hemoglobin: 12.1 g/dL (ref 12.0–15.0)
Immature Granulocytes: 2 %
Lymphocytes Relative: 68 %
Lymphs Abs: 18.3 10*3/uL — ABNORMAL HIGH (ref 0.7–4.0)
MCH: 29.1 pg (ref 26.0–34.0)
MCHC: 31.1 g/dL (ref 30.0–36.0)
MCV: 93.5 fL (ref 80.0–100.0)
Monocytes Absolute: 1.3 10*3/uL — ABNORMAL HIGH (ref 0.1–1.0)
Monocytes Relative: 5 %
Neutro Abs: 6.1 10*3/uL (ref 1.7–7.7)
Neutrophils Relative %: 23 %
Platelet Count: 228 10*3/uL (ref 150–400)
RBC: 4.16 MIL/uL (ref 3.87–5.11)
RDW: 15.9 % — ABNORMAL HIGH (ref 11.5–15.5)
WBC Count: 26.6 10*3/uL — ABNORMAL HIGH (ref 4.0–10.5)
nRBC: 0 % (ref 0.0–0.2)

## 2018-07-29 LAB — LACTATE DEHYDROGENASE: LDH: 265 U/L — ABNORMAL HIGH (ref 98–192)

## 2018-07-29 LAB — CMP (CANCER CENTER ONLY)
ALT: 40 U/L (ref 0–44)
AST: 28 U/L (ref 15–41)
Albumin: 3.1 g/dL — ABNORMAL LOW (ref 3.5–5.0)
Alkaline Phosphatase: 89 U/L (ref 38–126)
Anion gap: 8 (ref 5–15)
BUN: 9 mg/dL (ref 8–23)
CO2: 23 mmol/L (ref 22–32)
Calcium: 8.8 mg/dL — ABNORMAL LOW (ref 8.9–10.3)
Chloride: 107 mmol/L (ref 98–111)
Creatinine: 0.75 mg/dL (ref 0.44–1.00)
GFR, Est AFR Am: >60 mL/min (ref >60)
GFR, Estimated: >60 mL/min (ref >60)
Glucose, Bld: 107 mg/dL — ABNORMAL HIGH (ref 70–99)
Potassium: 4 mmol/L (ref 3.5–5.1)
Sodium: 138 mmol/L (ref 135–145)
Total Bilirubin: 0.5 mg/dL (ref 0.3–1.2)
Total Protein: 6.7 g/dL (ref 6.5–8.1)

## 2018-07-29 NOTE — Progress Notes (Signed)
Hematology and Oncology Follow Up Visit  Nicole Campbell [id number] 1945-11-08 73 y.o. 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  She presented with lymphocytosis and lymphadenopathy with p53 deletion and deletion of ATM.    Current therapy: Under consideration to start weekly rituximab for 4 weeks.  Interim History: Ms. Gruenberg is here for a follow-up visit.  Since the last visit, since her last visit, she reports diffuse arthralgias related to her rheumatoid arthritis that has been exacerbated.  She was tapered off prednisone and has reported issues with joint pain and swelling.  She denies any fevers, chills or sweats.  She denies any painful adenopathy.  Her performance status has been affected because of that and she is not able to drive.  Patient denied headaches, blurry vision, syncope or seizures.  Denies any fevers, chills or sweats.  Denied chest pain, palpitation, orthopnea or leg edema.  Denied cough, wheezing or hemoptysis.  Denied nausea, vomiting or abdominal pain.  Denies any constipation or diarrhea.  Denies any frequency urgency or hesitancy.  Denies any arthralgias or myalgias.  Denies any skin rashes or lesions.  Denies any bleeding or clotting tendency.  Denies any easy bruising.  Denies any hair or nail changes.  Denies any anxiety or depression.  Remaining review of system is negative.          Medications: I have reviewed the patient's current medications.  Current Outpatient Medications  Medication Sig Dispense Refill  . levothyroxine (SYNTHROID, LEVOTHROID) 25 MCG tablet     . predniSONE (DELTASONE) 10 MG tablet Take 10 mg by mouth as directed. 3 tabs x 3 days; 2 tabs x 3 days; then 1 tab daily maintenance dose     No current facility-administered medications for this visit.      Allergies:  Allergies  Allergen Reactions  . Erythromycin     Past Medical  History, Surgical history, Social history, and Family History were reviewed and updated.    Physical Exam: Blood pressure (!) 144/66, pulse 96, temperature 98.4 F (36.9 C), temperature source Oral, resp. rate 18, height 5' 7.5" (1.715 m), weight 178 lb 3.2 oz (80.8 kg), SpO2 100 %.   ECOG:1     General appearance: Alert, awake without any distress. Head: Atraumatic without abnormalities Oropharynx: Without any thrush or ulcers. Eyes: No scleral icterus. Lymph nodes: No lymphadenopathy noted in the cervical, supraclavicular, or axillary nodes Heart:regular rate and rhythm, without any murmurs or gallops.   Lung: Clear to auscultation without any rhonchi, wheezes or dullness to percussion. Abdomin: Soft, nontender without any shifting dullness or ascites. Musculoskeletal: No clubbing or cyanosis. Neurological: No motor or sensory deficits. Skin: No rashes or lesions.         Lab Results: Lab Results  Component Value Date   WBC 26.6 (H) 07/29/2018   HGB 12.1 07/29/2018   HCT 38.9 07/29/2018   MCV 93.5 07/29/2018   PLT 228 07/29/2018     Chemistry      Component Value Date/Time   NA 140 03/22/2018 1104   K 3.8 03/22/2018 1104   CL 107 03/22/2018 1104   CO2 23 03/22/2018 1104   BUN 10 03/22/2018 1104   CREATININE 0.78 03/22/2018 1104      Component Value Date/Time   CALCIUM 9.6 03/22/2018 1104   ALKPHOS 85 03/22/2018 1104   AST 25 03/22/2018 1104   ALT 25 03/22/2018 1104  BILITOT 0.5 03/22/2018 1104        IMPRESSION: 1. Mild bilateral axillary, bilateral subpectoral, mediastinal, retroperitoneal and right pelvic lymphadenopathy, compatible with lymphoproliferative disorder. 2. Spleen is normal size. 3. Nonspecific small non expansile right T9 pedicle lytic lesion. 4.  Aortic Atherosclerosis (ICD10-I70.0).    Impression and Plan:   73 year old woman with:  1.  Stage I CLL diagnosed in November 2019.  She was found to have lymphocytosis and  mild adenopathy with p53 and ATM mutation.    The natural course of this disease was discussed again and treatment indications were reviewed.  CT scan obtained in November 2019 was also reviewed today.  Her lymphadenopathy is rather small and her lymphocytosis has not increased dramatically.  Her lymphocyte doubling time remains more than 6 months.  Options of treatment at this time were reviewed which include observation and surveillance, ibrutinib or rituximab.  Complication associated with rituximab were reviewed today.  These include nausea, fatigue, infusion related complications that includes pruritus, hives and rarely anaphylaxis.  After discussion today, she is agreeable to proceed with rituximab and the plan is to complete weekly for 4 weeks course.   2.  Rheumatoid arthritis: The rationale for using rituximab for treating of rheumatoid arthritis was discussed.  This was reviewed with her rheumatologist as well.  She will receive rituximab and refrain from using prednisone for the time being.    3.  Follow-up: We will be in this week to start rituximab on a weekly basis to complete 4 weeks.  She will have MD follow-up in May 2020.  25  minutes was spent with the patient face-to-face today.  More than 50% of time was spent on reviewing her disease status, treatment options, complications related to therapy answering questions regarding future plan of care.   Zola Button, MD 3/30/20208:29 AM

## 2018-07-30 ENCOUNTER — Telehealth: Payer: Self-pay | Admitting: Oncology

## 2018-07-30 LAB — HEPATITIS B CORE ANTIBODY, TOTAL: Hep B Core Total Ab: NEGATIVE

## 2018-07-30 LAB — HEPATITIS B SURFACE ANTIGEN: Hepatitis B Surface Ag: NEGATIVE

## 2018-07-30 LAB — HEPATITIS B SURFACE ANTIBODY,QUALITATIVE: Hep B S Ab: NONREACTIVE

## 2018-07-30 NOTE — Telephone Encounter (Signed)
No los per 3/30. °

## 2018-07-31 ENCOUNTER — Inpatient Hospital Stay: Payer: PPO | Attending: Oncology

## 2018-07-31 ENCOUNTER — Telehealth: Payer: Self-pay | Admitting: Medical

## 2018-07-31 ENCOUNTER — Inpatient Hospital Stay (HOSPITAL_BASED_OUTPATIENT_CLINIC_OR_DEPARTMENT_OTHER): Payer: PPO | Admitting: Medical

## 2018-07-31 ENCOUNTER — Other Ambulatory Visit: Payer: Self-pay

## 2018-07-31 ENCOUNTER — Telehealth: Payer: Self-pay

## 2018-07-31 VITALS — BP 127/78 | HR 116 | Temp 99.1°F | Resp 18

## 2018-07-31 DIAGNOSIS — Z5112 Encounter for antineoplastic immunotherapy: Secondary | ICD-10-CM | POA: Insufficient documentation

## 2018-07-31 DIAGNOSIS — T8090XA Unspecified complication following infusion and therapeutic injection, initial encounter: Secondary | ICD-10-CM

## 2018-07-31 DIAGNOSIS — C9110 Chronic lymphocytic leukemia of B-cell type not having achieved remission: Secondary | ICD-10-CM | POA: Diagnosis not present

## 2018-07-31 MED ORDER — ACETAMINOPHEN 325 MG PO TABS
650.0000 mg | ORAL_TABLET | Freq: Once | ORAL | Status: AC
  Administered 2018-07-31: 650 mg via ORAL

## 2018-07-31 MED ORDER — FAMOTIDINE IN NACL 20-0.9 MG/50ML-% IV SOLN
20.0000 mg | Freq: Once | INTRAVENOUS | Status: AC | PRN
  Administered 2018-07-31: 20 mg via INTRAVENOUS

## 2018-07-31 MED ORDER — DIPHENHYDRAMINE HCL 25 MG PO CAPS
ORAL_CAPSULE | ORAL | Status: AC
  Filled 2018-07-31: qty 2

## 2018-07-31 MED ORDER — DIPHENHYDRAMINE HCL 25 MG PO CAPS
50.0000 mg | ORAL_CAPSULE | Freq: Once | ORAL | Status: AC
  Administered 2018-07-31: 50 mg via ORAL

## 2018-07-31 MED ORDER — SODIUM CHLORIDE 0.9 % IV SOLN
375.0000 mg/m2 | Freq: Once | INTRAVENOUS | Status: AC
  Administered 2018-07-31: 700 mg via INTRAVENOUS
  Filled 2018-07-31: qty 50

## 2018-07-31 MED ORDER — ACETAMINOPHEN 325 MG PO TABS
ORAL_TABLET | ORAL | Status: AC
  Filled 2018-07-31: qty 2

## 2018-07-31 MED ORDER — MEPERIDINE HCL 25 MG/ML IJ SOLN
25.0000 mg | Freq: Once | INTRAMUSCULAR | Status: AC
  Administered 2018-07-31: 25 mg via INTRAVENOUS

## 2018-07-31 MED ORDER — SODIUM CHLORIDE 0.9 % IV SOLN
Freq: Once | INTRAVENOUS | Status: AC
  Administered 2018-07-31: 09:00:00 via INTRAVENOUS
  Filled 2018-07-31: qty 250

## 2018-07-31 MED ORDER — MEPERIDINE HCL 25 MG/ML IJ SOLN
INTRAMUSCULAR | Status: AC
  Filled 2018-07-31: qty 1

## 2018-07-31 MED ORDER — MEPERIDINE HCL 25 MG/ML IJ SOLN
25.0000 mg | Freq: Once | INTRAMUSCULAR | Status: DC
  Administered 2018-07-31: 25 mg via INTRAVENOUS

## 2018-07-31 NOTE — Telephone Encounter (Signed)
No 4/1 los °

## 2018-07-31 NOTE — Telephone Encounter (Signed)
Received call from patient stating that she usually takes Ibuprofen for headaches and wanted to make sure that is still okay. She stated that they did giver her Tylenol prior to her treatment today. Advised that she can take ibuprofen for intermittent headaches since that has worked in the past. She stated that she feels fine right now and has no other questions but knows to call with other questions or concerns.

## 2018-07-31 NOTE — Patient Instructions (Signed)
Rituximab injection (rituxan) What is this medicine? RITUXIMAB (ri TUX i mab) is a monoclonal antibody. It is used to treat certain types of cancer like non-Hodgkin lymphoma and chronic lymphocytic leukemia. It is also used to treat rheumatoid arthritis, granulomatosis with polyangiitis (or Wegener's granulomatosis), microscopic polyangiitis, and pemphigus vulgaris. This medicine may be used for other purposes; ask your health care provider or pharmacist if you have questions. COMMON BRAND NAME(S): Rituxan What should I tell my health care provider before I take this medicine? They need to know if you have any of these conditions: -heart disease -infection (especially a virus infection such as hepatitis B, chickenpox, cold sores, or herpes) -immune system problems -irregular heartbeat -kidney disease -low blood counts, like low white cell, platelet, or red cell counts -lung or breathing disease, like asthma -recently received or scheduled to receive a vaccine -an unusual or allergic reaction to rituximab, other medicines, foods, dyes, or preservatives -pregnant or trying to get pregnant -breast-feeding How should I use this medicine? This medicine is for infusion into a vein. It is administered in a hospital or clinic by a specially trained health care professional. A special MedGuide will be given to you by the pharmacist with each prescription and refill. Be sure to read this information carefully each time. Talk to your pediatrician regarding the use of this medicine in children. This medicine is not approved for use in children. Overdosage: If you think you have taken too much of this medicine contact a poison control center or emergency room at once. NOTE: This medicine is only for you. Do not share this medicine with others. What if I miss a dose? It is important not to miss a dose. Call your doctor or health care professional if you are unable to keep an appointment. What may  interact with this medicine? -cisplatin -live virus vaccines This list may not describe all possible interactions. Give your health care provider a list of all the medicines, herbs, non-prescription drugs, or dietary supplements you use. Also tell them if you smoke, drink alcohol, or use illegal drugs. Some items may interact with your medicine. What should I watch for while using this medicine? Your condition will be monitored carefully while you are receiving this medicine. You may need blood work done while you are taking this medicine. This medicine can cause serious allergic reactions. To reduce your risk you may need to take medicine before treatment with this medicine. Take your medicine as directed. In some patients, this medicine may cause a serious brain infection that may cause death. If you have any problems seeing, thinking, speaking, walking, or standing, tell your healthcare professional right away. If you cannot reach your healthcare professional, urgently seek other source of medical care. Call your doctor or health care professional for advice if you get a fever, chills or sore throat, or other symptoms of a cold or flu. Do not treat yourself. This drug decreases your body's ability to fight infections. Try to avoid being around people who are sick. Do not become pregnant while taking this medicine or for 12 months after stopping it. Women should inform their doctor if they wish to become pregnant or think they might be pregnant. There is a potential for serious side effects to an unborn child. Talk to your health care professional or pharmacist for more information. Do not breast-feed an infant while taking this medicine or for 6 months after stopping it. What side effects may I notice from receiving this medicine? Side  effects that you should report to your doctor or health care professional as soon as possible: -allergic reactions like skin rash, itching or hives; swelling of the  face, lips, or tongue -breathing problems -chest pain -changes in vision -diarrhea -headache with fever, neck stiffness, sensitivity to light, nausea, or confusion -fast, irregular heartbeat -loss of memory -low blood counts - this medicine may decrease the number of white blood cells, red blood cells and platelets. You may be at increased risk for infections and bleeding. -mouth sores -problems with balance, talking, or walking -redness, blistering, peeling or loosening of the skin, including inside the mouth -signs of infection - fever or chills, cough, sore throat, pain or difficulty passing urine -signs and symptoms of kidney injury like trouble passing urine or change in the amount of urine -signs and symptoms of liver injury like dark yellow or brown urine; general ill feeling or flu-like symptoms; light-colored stools; loss of appetite; nausea; right upper belly pain; unusually weak or tired; yellowing of the eyes or skin -signs and symptoms of low blood pressure like dizziness; feeling faint or lightheaded, falls; unusually weak or tired -stomach pain -swelling of the ankles, feet, hands -unusual bleeding or bruising -vomiting Side effects that usually do not require medical attention (report to your doctor or health care professional if they continue or are bothersome): -headache -joint pain -muscle cramps or muscle pain -nausea -tiredness This list may not describe all possible side effects. Call your doctor for medical advice about side effects. You may report side effects to FDA at 1-800-FDA-1088. Where should I keep my medicine? This drug is given in a hospital or clinic and will not be stored at home. NOTE: This sheet is a summary. It may not cover all possible information. If you have questions about this medicine, talk to your doctor, pharmacist, or health care provider.  2019 Elsevier/Gold Standard (2017-03-30 13:04:32)   [facility] Discharge  Instructions for Patients Receiving Chemotherapy  Today you received the following chemotherapy agents Rituxan  To help prevent nausea and vomiting after your treatment, we encourage you to take your nausea medication as prescribed If you develop nausea and vomiting that is not controlled by your nausea medication, call the clinic.   BELOW ARE SYMPTOMS THAT SHOULD BE REPORTED IMMEDIATELY:  *FEVER GREATER THAN 100.5 F  *CHILLS WITH OR WITHOUT FEVER  NAUSEA AND VOMITING THAT IS NOT CONTROLLED WITH YOUR NAUSEA MEDICATION  *UNUSUAL SHORTNESS OF BREATH  *UNUSUAL BRUISING OR BLEEDING  TENDERNESS IN MOUTH AND THROAT WITH OR WITHOUT PRESENCE OF ULCERS  *URINARY PROBLEMS  *BOWEL PROBLEMS  UNUSUAL RASH Items with * indicate a potential emergency and should be followed up as soon as possible.  Feel free to call the clinic should you have any questions or concerns. The clinic phone number is [phone].  Please show the [facility] at check-in to the Emergency Department and triage nurse.  Coronavirus (COVID-19) Are you at risk?  Are you at risk for the Coronavirus (COVID-19)?  To be considered HIGH RISK for Coronavirus (COVID-19), you have to meet the following criteria:  . Traveled to [place], [place], [place], [place] or [place]; or in the [place] to [place], [place], [place], or [place]; and have fever, cough, and shortness of breath within the last 2 weeks of travel OR . Been in close contact with a person diagnosed with COVID-19 within the last 2 weeks and have fever, cough, and shortness of breath . IF  YOU DO NOT MEET THESE CRITERIA, YOU ARE CONSIDERED LOW RISK FOR COVID-19.  What to do if you are HIGH RISK for COVID-19?  Marland Kitchen If you are having a medical emergency, call 911. . Seek medical care right away. Before you go to a doctor's office, urgent care or emergency department, call ahead and tell them about your recent travel, contact with someone diagnosed  with COVID-19, and your symptoms. You should receive instructions from your physician's office regarding next steps of care.  . When you arrive at healthcare provider, tell the healthcare staff immediately you have returned from visiting [place], [place], [place], [place] or [place]; or traveled in the [place] to [place], [place], [place], or [place]; in the last two weeks or you have been in close contact with a person diagnosed with COVID-19 in the last 2 weeks.   . Tell the health care staff about your symptoms: fever, cough and shortness of breath. . After you have been seen by a medical provider, you will be either: o Tested for (COVID-19) and discharged home on quarantine except to seek medical care if symptoms worsen, and asked to  - Stay home and avoid contact with others until you get your results (4-5 days)  - Avoid travel on public transportation if possible (such as bus, train, or airplane) or o Sent to the Emergency Department by EMS for evaluation, COVID-19 testing, and possible admission depending on your condition and test results.  What to do if you are LOW RISK for COVID-19?  Reduce your risk of any infection by using the same precautions used for avoiding the common cold or flu:  Marland Kitchen Wash your hands often with soap and warm water for at least 20 seconds.  If soap and water are not readily available, use an alcohol-based hand sanitizer with at least 60% alcohol.  . If coughing or sneezing, cover your mouth and nose by coughing or sneezing into the elbow areas of your shirt or coat, into a tissue or into your sleeve (not your hands). . Avoid shaking hands with others and consider head nods or verbal greetings only. . Avoid touching your eyes, nose, or mouth with unwashed hands.  . Avoid close contact with people who are sick. . Avoid places or events with large numbers of people in one location, like concerts or sporting events. . Carefully consider travel plans you have  or are making. . If you are planning any travel outside or inside the [place], visit the CDC's Travelers' Health webpage for the latest health notices. . If you have some symptoms but not all symptoms, continue to monitor at home and seek medical attention if your symptoms worsen. . If you are having a medical emergency, call 911.   [place] / e-Visit: [email or web address]         [facility]: [facility]: [phone]                   [facility]: [phone]

## 2018-07-31 NOTE — Progress Notes (Signed)
DATE:  07/31/2018                                          X  CHEMO/IMMUNOTHERAPY REACTION            MD:  Dr. Zola Button   AGENT/BLOOD PRODUCT RECEIVING TODAY:                  Rituxan     AGENT/BLOOD PRODUCT RECEIVING IMMEDIATELY PRIOR TO REACTION:       Rituxan at 150 mg/hour   VS: BP:     125/64  P:       83       SPO2:       99  T: 98.2                BP:     140/97 P:       107     SPO2:       100  T:    98.1 BP:     158/99  P:       106     SPO2:       100    T: 98.0              BP:     176/96  P:       106     SPO2:       100    T: 98.3 BP:     165/75  P:       96       SPO2:       100    T: 98.3              REACTION(S):           Rigors   PREMEDS:      Tylenol and Benadryl   INTERVENTION: Demerol 25 mg IV x 1, then Pepcid 20 mg IV x 1, then Demerol 25 mg IV x 1   Review of Systems  Review of Systems  Constitutional: Negative for chills, diaphoresis and fever.  HENT: Negative for trouble swallowing and voice change.   Respiratory: Negative for cough, chest tightness, shortness of breath and wheezing.   Cardiovascular: Negative for chest pain and palpitations.  Gastrointestinal: Negative for abdominal pain, constipation, diarrhea, nausea and vomiting.  Musculoskeletal: Negative for back pain and myalgias.  Neurological: Positive for tremors. Negative for dizziness, light-headedness and headaches.     Physical Exam  Physical Exam Constitutional:      General: She is not in acute distress.    Appearance: She is not diaphoretic.     Comments: The patient is an elderly female with rigors  HENT:     Head: Normocephalic and atraumatic.  Cardiovascular:     Rate and Rhythm: Normal rate and regular rhythm.     Heart sounds: Normal heart sounds. No murmur. No friction rub. No gallop.   Pulmonary:     Effort: Pulmonary effort is normal. No respiratory distress.     Breath sounds: Normal breath sounds. No wheezing or rales.  Skin:    General: Skin is warm and  dry.     Findings: No erythema or rash.  Neurological:     Mental Status: She is alert.     OUTCOME:  Symptoms abated. Rituxan was restarted at a lower rate of 100 mg/hour.  Rituxan infusion was completed.   Sandi Mealy, MHS, PA-C

## 2018-07-31 NOTE — Progress Notes (Signed)
@   1048 during Rituxan infusion pt c/o "chills" and was shaking (rigors). Infusion stopped and NS hung to gravity, Hypersensitivity protocol started and Sandi Mealy PA to infusion room. Medications administered as charted in [facility], VS as charted in flowsheets.   Per Sandi Mealy PA ok to re-challenge at lower dose @1127 . Pt finished infusion w/o further incident.  VSS at d/c.

## 2018-07-31 NOTE — Telephone Encounter (Signed)
-----   Message from Thomasene Lot, RN sent at 07/31/2018  2:32 PM EDT ----- Regarding: Dr Alen Blew 1st tx f/u call 1st tx f/u call

## 2018-08-07 ENCOUNTER — Inpatient Hospital Stay: Payer: PPO

## 2018-08-07 ENCOUNTER — Other Ambulatory Visit: Payer: Self-pay

## 2018-08-07 VITALS — BP 135/68 | HR 96 | Temp 98.1°F | Resp 18

## 2018-08-07 DIAGNOSIS — Z5112 Encounter for antineoplastic immunotherapy: Secondary | ICD-10-CM | POA: Diagnosis not present

## 2018-08-07 DIAGNOSIS — C9110 Chronic lymphocytic leukemia of B-cell type not having achieved remission: Secondary | ICD-10-CM

## 2018-08-07 LAB — CMP (CANCER CENTER ONLY)
ALT: 26 U/L (ref 0–44)
AST: 21 U/L (ref 15–41)
Albumin: 2.8 g/dL — ABNORMAL LOW (ref 3.5–5.0)
Alkaline Phosphatase: 88 U/L (ref 38–126)
Anion gap: 10 (ref 5–15)
BUN: 9 mg/dL (ref 8–23)
CO2: 23 mmol/L (ref 22–32)
Calcium: 9 mg/dL (ref 8.9–10.3)
Chloride: 105 mmol/L (ref 98–111)
Creatinine: 0.74 mg/dL (ref 0.44–1.00)
GFR, Est AFR Am: >60 mL/min (ref >60)
GFR, Estimated: >60 mL/min (ref >60)
Glucose, Bld: 108 mg/dL — ABNORMAL HIGH (ref 70–99)
Potassium: 4.1 mmol/L (ref 3.5–5.1)
Sodium: 138 mmol/L (ref 135–145)
Total Bilirubin: 0.6 mg/dL (ref 0.3–1.2)
Total Protein: 7.1 g/dL (ref 6.5–8.1)

## 2018-08-07 LAB — CBC WITH DIFFERENTIAL (CANCER CENTER ONLY)
Abs Immature Granulocytes: 0.24 10*3/uL — ABNORMAL HIGH (ref 0.00–0.07)
Basophils Absolute: 0.1 10*3/uL (ref 0.0–0.1)
Basophils Relative: 1 %
Eosinophils Absolute: 0.4 10*3/uL (ref 0.0–0.5)
Eosinophils Relative: 4 %
HCT: 35.7 % — ABNORMAL LOW (ref 36.0–46.0)
Hemoglobin: 11.4 g/dL — ABNORMAL LOW (ref 12.0–15.0)
Immature Granulocytes: 2 %
Lymphocytes Relative: 38 %
Lymphs Abs: 4 10*3/uL (ref 0.7–4.0)
MCH: 28.9 pg (ref 26.0–34.0)
MCHC: 31.9 g/dL (ref 30.0–36.0)
MCV: 90.6 fL (ref 80.0–100.0)
Monocytes Absolute: 1.1 10*3/uL — ABNORMAL HIGH (ref 0.1–1.0)
Monocytes Relative: 11 %
Neutro Abs: 4.7 10*3/uL (ref 1.7–7.7)
Neutrophils Relative %: 44 %
Platelet Count: 335 10*3/uL (ref 150–400)
RBC: 3.94 MIL/uL (ref 3.87–5.11)
RDW: 14.7 % (ref 11.5–15.5)
WBC Count: 10.4 10*3/uL (ref 4.0–10.5)
nRBC: 0 % (ref 0.0–0.2)

## 2018-08-07 MED ORDER — SODIUM CHLORIDE 0.9 % IV SOLN
Freq: Once | INTRAVENOUS | Status: AC
  Administered 2018-08-07: 09:00:00 via INTRAVENOUS
  Filled 2018-08-07: qty 250

## 2018-08-07 MED ORDER — ACETAMINOPHEN 325 MG PO TABS
650.0000 mg | ORAL_TABLET | Freq: Once | ORAL | Status: AC
  Administered 2018-08-07: 650 mg via ORAL

## 2018-08-07 MED ORDER — SODIUM CHLORIDE 0.9 % IV SOLN
20.0000 mg | Freq: Once | INTRAVENOUS | Status: AC
  Administered 2018-08-07: 20 mg via INTRAVENOUS
  Filled 2018-08-07: qty 2

## 2018-08-07 MED ORDER — DIPHENHYDRAMINE HCL 25 MG PO CAPS
ORAL_CAPSULE | ORAL | Status: AC
  Filled 2018-08-07: qty 2

## 2018-08-07 MED ORDER — METHYLPREDNISOLONE SODIUM SUCC 125 MG IJ SOLR
INTRAMUSCULAR | Status: AC
  Filled 2018-08-07: qty 2

## 2018-08-07 MED ORDER — ACETAMINOPHEN 325 MG PO TABS
ORAL_TABLET | ORAL | Status: AC
  Filled 2018-08-07: qty 2

## 2018-08-07 MED ORDER — METHYLPREDNISOLONE SODIUM SUCC 125 MG IJ SOLR
60.0000 mg | Freq: Once | INTRAMUSCULAR | Status: AC
  Administered 2018-08-07: 60 mg via INTRAVENOUS

## 2018-08-07 MED ORDER — DIPHENHYDRAMINE HCL 25 MG PO CAPS
50.0000 mg | ORAL_CAPSULE | Freq: Once | ORAL | Status: AC
  Administered 2018-08-07: 10:00:00 50 mg via ORAL

## 2018-08-07 MED ORDER — SODIUM CHLORIDE 0.9 % IV SOLN
375.0000 mg/m2 | Freq: Once | INTRAVENOUS | Status: AC
  Administered 2018-08-07: 700 mg via INTRAVENOUS
  Filled 2018-08-07: qty 20

## 2018-08-07 NOTE — Progress Notes (Signed)
Pt had reaction with her first Rituxan.  Will add Pepcid 20mg  IV and SM 60mg  IV to todays premedications as discussed per MD.  Hardie Pulley, PharmD, BCPS, BCOP

## 2018-08-07 NOTE — Patient Instructions (Signed)
[facility] Discharge Instructions for Patients Receiving Chemotherapy  Today you received the following chemotherapy agents:  Rituxan.  To help prevent nausea and vomiting after your treatment, we encourage you to take your nausea medication as directed.   If you develop nausea and vomiting that is not controlled by your nausea medication, call the clinic.   BELOW ARE SYMPTOMS THAT SHOULD BE REPORTED IMMEDIATELY:  *FEVER GREATER THAN 100.5 F  *CHILLS WITH OR WITHOUT FEVER  NAUSEA AND VOMITING THAT IS NOT CONTROLLED WITH YOUR NAUSEA MEDICATION  *UNUSUAL SHORTNESS OF BREATH  *UNUSUAL BRUISING OR BLEEDING  TENDERNESS IN MOUTH AND THROAT WITH OR WITHOUT PRESENCE OF ULCERS  *URINARY PROBLEMS  *BOWEL PROBLEMS  UNUSUAL RASH Items with * indicate a potential emergency and should be followed up as soon as possible.  Feel free to call the clinic should you have any questions or concerns. The clinic phone number is [phone].  Please show the CHEMO ALERT CARD at check-in to the Emergency Department and triage nurse.   

## 2018-08-09 ENCOUNTER — Telehealth: Payer: Self-pay | Admitting: *Deleted

## 2018-08-09 NOTE — Telephone Encounter (Signed)
Patient called needing advice.    She reports having completed her second treatment of Rituxan for Rheumatoid Arthritis.  Prior to starting she was on a maintaince dose of Prednisone 5 mg daily and her and Dr. Alen Blew agreed that she would stop daily prednisone to see if the Rituxan was effective.  She stated that at her last dose she was given IV Solumedrol because of her resurfacing pain and it helped until last night.  Patient states that the pain is an 8 out of 10 and it is interfering with her daily activities and she has to rely on her husband for assistance.    She wanted to know how long before would she start to see benefit from Rituxan?  Also wanted to know would it be best for her to restart the daily Prednisone 5 mg since the pain is not resolving.  Motrin isn't effective for long periods either.  Patient was tearful during conversation due to pain and no resolve from it.  She has refill already on hand of Prednisone at home if that is the route Dr. Alen Blew encourages her to take.

## 2018-08-09 NOTE — Telephone Encounter (Signed)
I have no objections to starting Prednisone. It might take few weeks for Rituxan to work. She needs to call her Rheumatologist for any further direction.

## 2018-08-09 NOTE — Telephone Encounter (Signed)
Relayed message to patient and she questioned if she should restart or titrate back up to daily maintenance dose.  Deferred that Rheumatologist to answer.

## 2018-08-14 ENCOUNTER — Other Ambulatory Visit: Payer: Self-pay

## 2018-08-14 ENCOUNTER — Inpatient Hospital Stay: Payer: PPO

## 2018-08-14 VITALS — BP 136/60 | HR 80 | Temp 98.0°F | Resp 16

## 2018-08-14 DIAGNOSIS — Z5112 Encounter for antineoplastic immunotherapy: Secondary | ICD-10-CM | POA: Diagnosis not present

## 2018-08-14 DIAGNOSIS — C9110 Chronic lymphocytic leukemia of B-cell type not having achieved remission: Secondary | ICD-10-CM

## 2018-08-14 LAB — CMP (CANCER CENTER ONLY)
ALT: 14 U/L (ref 0–44)
AST: 14 U/L — ABNORMAL LOW (ref 15–41)
Albumin: 2.8 g/dL — ABNORMAL LOW (ref 3.5–5.0)
Alkaline Phosphatase: 77 U/L (ref 38–126)
Anion gap: 10 (ref 5–15)
BUN: 9 mg/dL (ref 8–23)
CO2: 22 mmol/L (ref 22–32)
Calcium: 8.7 mg/dL — ABNORMAL LOW (ref 8.9–10.3)
Chloride: 107 mmol/L (ref 98–111)
Creatinine: 0.71 mg/dL (ref 0.44–1.00)
GFR, Est AFR Am: >60 mL/min (ref >60)
GFR, Estimated: >60 mL/min (ref >60)
Glucose, Bld: 107 mg/dL — ABNORMAL HIGH (ref 70–99)
Potassium: 3.7 mmol/L (ref 3.5–5.1)
Sodium: 139 mmol/L (ref 135–145)
Total Bilirubin: 0.3 mg/dL (ref 0.3–1.2)
Total Protein: 6.9 g/dL (ref 6.5–8.1)

## 2018-08-14 LAB — CBC WITH DIFFERENTIAL (CANCER CENTER ONLY)
Abs Immature Granulocytes: 0.17 10*3/uL — ABNORMAL HIGH (ref 0.00–0.07)
Basophils Absolute: 0.1 10*3/uL (ref 0.0–0.1)
Basophils Relative: 1 %
Eosinophils Absolute: 0.3 10*3/uL (ref 0.0–0.5)
Eosinophils Relative: 2 %
HCT: 35.8 % — ABNORMAL LOW (ref 36.0–46.0)
Hemoglobin: 11.4 g/dL — ABNORMAL LOW (ref 12.0–15.0)
Immature Granulocytes: 2 %
Lymphocytes Relative: 51 %
Lymphs Abs: 6 10*3/uL — ABNORMAL HIGH (ref 0.7–4.0)
MCH: 28.9 pg (ref 26.0–34.0)
MCHC: 31.8 g/dL (ref 30.0–36.0)
MCV: 90.9 fL (ref 80.0–100.0)
Monocytes Absolute: 1 10*3/uL (ref 0.1–1.0)
Monocytes Relative: 8 %
Neutro Abs: 4.2 10*3/uL (ref 1.7–7.7)
Neutrophils Relative %: 36 %
Platelet Count: 413 10*3/uL — ABNORMAL HIGH (ref 150–400)
RBC: 3.94 MIL/uL (ref 3.87–5.11)
RDW: 14.2 % (ref 11.5–15.5)
WBC Count: 11.6 10*3/uL — ABNORMAL HIGH (ref 4.0–10.5)
nRBC: 0 % (ref 0.0–0.2)

## 2018-08-14 MED ORDER — DIPHENHYDRAMINE HCL 25 MG PO CAPS
50.0000 mg | ORAL_CAPSULE | Freq: Once | ORAL | Status: AC
  Administered 2018-08-14: 50 mg via ORAL

## 2018-08-14 MED ORDER — ACETAMINOPHEN 325 MG PO TABS
ORAL_TABLET | ORAL | Status: AC
  Filled 2018-08-14: qty 2

## 2018-08-14 MED ORDER — SODIUM CHLORIDE 0.9 % IV SOLN
Freq: Once | INTRAVENOUS | Status: AC
  Administered 2018-08-14: 09:00:00 via INTRAVENOUS
  Filled 2018-08-14: qty 250

## 2018-08-14 MED ORDER — DIPHENHYDRAMINE HCL 25 MG PO CAPS
ORAL_CAPSULE | ORAL | Status: AC
  Filled 2018-08-14: qty 2

## 2018-08-14 MED ORDER — SODIUM CHLORIDE 0.9 % IV SOLN
20.0000 mg | Freq: Once | INTRAVENOUS | Status: AC
  Administered 2018-08-14: 20 mg via INTRAVENOUS
  Filled 2018-08-14: qty 2

## 2018-08-14 MED ORDER — ACETAMINOPHEN 325 MG PO TABS
650.0000 mg | ORAL_TABLET | Freq: Once | ORAL | Status: AC
  Administered 2018-08-14: 650 mg via ORAL

## 2018-08-14 MED ORDER — SODIUM CHLORIDE 0.9 % IV SOLN
375.0000 mg/m2 | Freq: Once | INTRAVENOUS | Status: AC
  Administered 2018-08-14: 700 mg via INTRAVENOUS
  Filled 2018-08-14: qty 50

## 2018-08-14 NOTE — Patient Instructions (Signed)
Coronavirus (COVID-19) Are you at risk?  Are you at risk for the Coronavirus (COVID-19)?  To be considered HIGH RISK for Coronavirus (COVID-19), you have to meet the following criteria:  . Traveled to [place], [place], [place], [place] or [place]; or in the [place] to [place], [place], [place], or [place]; and have fever, cough, and shortness of breath within the last 2 weeks of travel OR . Been in close contact with a person diagnosed with COVID-19 within the last 2 weeks and have fever, cough, and shortness of breath . IF YOU DO NOT MEET THESE CRITERIA, YOU ARE CONSIDERED LOW RISK FOR COVID-19.  What to do if you are HIGH RISK for COVID-19?  . If you are having a medical emergency, call 911. . Seek medical care right away. Before you go to a doctor's office, urgent care or emergency department, call ahead and tell them about your recent travel, contact with someone diagnosed with COVID-19, and your symptoms. You should receive instructions from your physician's office regarding next steps of care.  . When you arrive at healthcare provider, tell the healthcare staff immediately you have returned from visiting [place], [place], [place], [place] or [place]; or traveled in the [place] to [place], [place], [place], or [place]; in the last two weeks or you have been in close contact with a person diagnosed with COVID-19 in the last 2 weeks.   . Tell the health care staff about your symptoms: fever, cough and shortness of breath. . After you have been seen by a medical provider, you will be either: o Tested for (COVID-19) and discharged home on quarantine except to seek medical care if symptoms worsen, and asked to  - Stay home and avoid contact with others until you get your results (4-5 days)  - Avoid travel on public transportation if possible (such as bus, train, or airplane) or o Sent to the Emergency Department by EMS for evaluation, COVID-19 testing, and possible  admission depending on your condition and test results.  What to do if you are LOW RISK for COVID-19?  Reduce your risk of any infection by using the same precautions used for avoiding the common cold or flu:  . Wash your hands often with soap and warm water for at least 20 seconds.  If soap and water are not readily available, use an alcohol-based hand sanitizer with at least 60% alcohol.  . If coughing or sneezing, cover your mouth and nose by coughing or sneezing into the elbow areas of your shirt or coat, into a tissue or into your sleeve (not your hands). . Avoid shaking hands with others and consider head nods or verbal greetings only. . Avoid touching your eyes, nose, or mouth with unwashed hands.  . Avoid close contact with people who are sick. . Avoid places or events with large numbers of people in one location, like concerts or sporting events. . Carefully consider travel plans you have or are making. . If you are planning any travel outside or inside the [place], visit the CDC's Travelers' Health webpage for the latest health notices. . If you have some symptoms but not all symptoms, continue to monitor at home and seek medical attention if your symptoms worsen. . If you are having a medical emergency, call 911.   ADDITIONAL HEALTHCARE OPTIONS FOR PATIENTS  [facility] / e-Visit: [email or web address]         [facility]: [phone]  [facility]: [phone]                   [facility]: [phone]   [facility] Discharge Instructions for Patients Receiving Chemotherapy  Today you received the following chemotherapy agents Rituxan  To help prevent nausea and vomiting after your treatment, we encourage you to take your nausea medication as directed.    If you develop nausea and vomiting that is not controlled by your nausea medication, call the clinic.   BELOW ARE  SYMPTOMS THAT SHOULD BE REPORTED IMMEDIATELY:  *FEVER GREATER THAN 100.5 F  *CHILLS WITH OR WITHOUT FEVER  NAUSEA AND VOMITING THAT IS NOT CONTROLLED WITH YOUR NAUSEA MEDICATION  *UNUSUAL SHORTNESS OF BREATH  *UNUSUAL BRUISING OR BLEEDING  TENDERNESS IN MOUTH AND THROAT WITH OR WITHOUT PRESENCE OF ULCERS  *URINARY PROBLEMS  *BOWEL PROBLEMS  UNUSUAL RASH Items with * indicate a potential emergency and should be followed up as soon as possible.  Feel free to call the clinic should you have any questions or concerns. The clinic phone number is [phone].  Please show the CHEMO ALERT CARD at check-in to the Emergency Department and triage nurse.   

## 2018-08-14 NOTE — Progress Notes (Signed)
Per Dr. Alen Blew, hold solumedrol premed today due to patient starting prednisone taper yesterday.  Pt currently taking 15mg  prednisone today.

## 2018-08-21 ENCOUNTER — Inpatient Hospital Stay: Payer: PPO

## 2018-08-21 ENCOUNTER — Other Ambulatory Visit: Payer: Self-pay

## 2018-08-21 VITALS — BP 135/70 | HR 79 | Temp 98.5°F | Resp 18

## 2018-08-21 DIAGNOSIS — C9110 Chronic lymphocytic leukemia of B-cell type not having achieved remission: Secondary | ICD-10-CM

## 2018-08-21 DIAGNOSIS — Z5112 Encounter for antineoplastic immunotherapy: Secondary | ICD-10-CM | POA: Diagnosis not present

## 2018-08-21 LAB — CMP (CANCER CENTER ONLY)
ALT: 15 U/L (ref 0–44)
AST: 13 U/L — ABNORMAL LOW (ref 15–41)
Albumin: 3.1 g/dL — ABNORMAL LOW (ref 3.5–5.0)
Alkaline Phosphatase: 78 U/L (ref 38–126)
Anion gap: 8 (ref 5–15)
BUN: 12 mg/dL (ref 8–23)
CO2: 24 mmol/L (ref 22–32)
Calcium: 8.9 mg/dL (ref 8.9–10.3)
Chloride: 106 mmol/L (ref 98–111)
Creatinine: 0.79 mg/dL (ref 0.44–1.00)
GFR, Est AFR Am: >60 mL/min (ref >60)
GFR, Estimated: >60 mL/min (ref >60)
Glucose, Bld: 93 mg/dL (ref 70–99)
Potassium: 3.9 mmol/L (ref 3.5–5.1)
Sodium: 138 mmol/L (ref 135–145)
Total Bilirubin: 0.4 mg/dL (ref 0.3–1.2)
Total Protein: 6.8 g/dL (ref 6.5–8.1)

## 2018-08-21 LAB — CBC WITH DIFFERENTIAL (CANCER CENTER ONLY)
Abs Immature Granulocytes: 0.21 10*3/uL — ABNORMAL HIGH (ref 0.00–0.07)
Basophils Absolute: 0.2 10*3/uL — ABNORMAL HIGH (ref 0.0–0.1)
Basophils Relative: 1 %
Eosinophils Absolute: 0.4 10*3/uL (ref 0.0–0.5)
Eosinophils Relative: 3 %
HCT: 38.6 % (ref 36.0–46.0)
Hemoglobin: 12.2 g/dL (ref 12.0–15.0)
Immature Granulocytes: 2 %
Lymphocytes Relative: 48 %
Lymphs Abs: 6.5 10*3/uL — ABNORMAL HIGH (ref 0.7–4.0)
MCH: 28.6 pg (ref 26.0–34.0)
MCHC: 31.6 g/dL (ref 30.0–36.0)
MCV: 90.4 fL (ref 80.0–100.0)
Monocytes Absolute: 1.2 10*3/uL — ABNORMAL HIGH (ref 0.1–1.0)
Monocytes Relative: 9 %
Neutro Abs: 5 10*3/uL (ref 1.7–7.7)
Neutrophils Relative %: 37 %
Platelet Count: 316 10*3/uL (ref 150–400)
RBC: 4.27 MIL/uL (ref 3.87–5.11)
RDW: 14.6 % (ref 11.5–15.5)
WBC Count: 13.5 10*3/uL — ABNORMAL HIGH (ref 4.0–10.5)
nRBC: 0 % (ref 0.0–0.2)

## 2018-08-21 MED ORDER — SODIUM CHLORIDE 0.9 % IV SOLN
20.0000 mg | Freq: Once | INTRAVENOUS | Status: AC
  Administered 2018-08-21: 20 mg via INTRAVENOUS
  Filled 2018-08-21: qty 2

## 2018-08-21 MED ORDER — FAMOTIDINE IN NACL 20-0.9 MG/50ML-% IV SOLN: 20.0000 mg | Freq: Once | INTRAVENOUS | Status: DC

## 2018-08-21 MED ORDER — SODIUM CHLORIDE 0.9 % IV SOLN
Freq: Once | INTRAVENOUS | Status: AC
  Administered 2018-08-21: 09:00:00 via INTRAVENOUS
  Filled 2018-08-21: qty 250

## 2018-08-21 MED ORDER — SODIUM CHLORIDE 0.9 % IV SOLN
375.0000 mg/m2 | Freq: Once | INTRAVENOUS | Status: AC
  Administered 2018-08-21: 700 mg via INTRAVENOUS
  Filled 2018-08-21: qty 50

## 2018-08-21 MED ORDER — DIPHENHYDRAMINE HCL 25 MG PO CAPS
ORAL_CAPSULE | ORAL | Status: AC
  Filled 2018-08-21: qty 2

## 2018-08-21 MED ORDER — ACETAMINOPHEN 325 MG PO TABS
ORAL_TABLET | ORAL | Status: AC
  Filled 2018-08-21: qty 2

## 2018-08-21 MED ORDER — DIPHENHYDRAMINE HCL 25 MG PO CAPS
50.0000 mg | ORAL_CAPSULE | Freq: Once | ORAL | Status: AC
  Administered 2018-08-21: 50 mg via ORAL

## 2018-08-21 MED ORDER — METHYLPREDNISOLONE SODIUM SUCC 125 MG IJ SOLR
60.0000 mg | Freq: Once | INTRAMUSCULAR | Status: AC
  Administered 2018-08-21: 60 mg via INTRAVENOUS

## 2018-08-21 MED ORDER — ACETAMINOPHEN 325 MG PO TABS
650.0000 mg | ORAL_TABLET | Freq: Once | ORAL | Status: AC
  Administered 2018-08-21: 650 mg via ORAL

## 2018-08-21 MED ORDER — METHYLPREDNISOLONE SODIUM SUCC 125 MG IJ SOLR
INTRAMUSCULAR | Status: AC
  Filled 2018-08-21: qty 2

## 2018-08-21 NOTE — Patient Instructions (Signed)
[facility] Discharge Instructions for Patients Receiving Chemotherapy  Today you received the following chemotherapy agents:  Rituxan.  To help prevent nausea and vomiting after your treatment, we encourage you to take your nausea medication as directed.   If you develop nausea and vomiting that is not controlled by your nausea medication, call the clinic.   BELOW ARE SYMPTOMS THAT SHOULD BE REPORTED IMMEDIATELY:  *FEVER GREATER THAN 100.5 F  *CHILLS WITH OR WITHOUT FEVER  NAUSEA AND VOMITING THAT IS NOT CONTROLLED WITH YOUR NAUSEA MEDICATION  *UNUSUAL SHORTNESS OF BREATH  *UNUSUAL BRUISING OR BLEEDING  TENDERNESS IN MOUTH AND THROAT WITH OR WITHOUT PRESENCE OF ULCERS  *URINARY PROBLEMS  *BOWEL PROBLEMS  UNUSUAL RASH Items with * indicate a potential emergency and should be followed up as soon as possible.  Feel free to call the clinic should you have any questions or concerns. The clinic phone number is [phone].  Please show the CHEMO ALERT CARD at check-in to the Emergency Department and triage nurse.   

## 2018-09-04 ENCOUNTER — Other Ambulatory Visit: Payer: Self-pay | Admitting: Family Medicine

## 2018-09-04 DIAGNOSIS — M069 Rheumatoid arthritis, unspecified: Secondary | ICD-10-CM | POA: Diagnosis not present

## 2018-09-04 DIAGNOSIS — Z1231 Encounter for screening mammogram for malignant neoplasm of breast: Secondary | ICD-10-CM

## 2018-09-04 DIAGNOSIS — Z0000 Encounter for general adult medical examination without abnormal findings: Secondary | ICD-10-CM | POA: Diagnosis not present

## 2018-09-04 DIAGNOSIS — Z1389 Encounter for screening for other disorder: Secondary | ICD-10-CM | POA: Diagnosis not present

## 2018-09-04 DIAGNOSIS — E7800 Pure hypercholesterolemia, unspecified: Secondary | ICD-10-CM | POA: Diagnosis not present

## 2018-09-04 DIAGNOSIS — C9110 Chronic lymphocytic leukemia of B-cell type not having achieved remission: Secondary | ICD-10-CM | POA: Diagnosis not present

## 2018-09-04 DIAGNOSIS — E039 Hypothyroidism, unspecified: Secondary | ICD-10-CM | POA: Diagnosis not present

## 2018-09-05 DIAGNOSIS — C9110 Chronic lymphocytic leukemia of B-cell type not having achieved remission: Secondary | ICD-10-CM | POA: Diagnosis not present

## 2018-09-05 DIAGNOSIS — Z7189 Other specified counseling: Secondary | ICD-10-CM | POA: Diagnosis not present

## 2018-09-05 DIAGNOSIS — M069 Rheumatoid arthritis, unspecified: Secondary | ICD-10-CM | POA: Diagnosis not present

## 2018-09-05 DIAGNOSIS — R5383 Other fatigue: Secondary | ICD-10-CM | POA: Diagnosis not present

## 2018-09-05 DIAGNOSIS — Z79899 Other long term (current) drug therapy: Secondary | ICD-10-CM | POA: Diagnosis not present

## 2018-09-05 DIAGNOSIS — M25512 Pain in left shoulder: Secondary | ICD-10-CM | POA: Diagnosis not present

## 2018-09-05 DIAGNOSIS — L659 Nonscarring hair loss, unspecified: Secondary | ICD-10-CM | POA: Diagnosis not present

## 2018-09-18 ENCOUNTER — Inpatient Hospital Stay: Payer: PPO | Attending: Oncology | Admitting: Oncology

## 2018-09-18 ENCOUNTER — Inpatient Hospital Stay: Payer: PPO

## 2018-09-18 ENCOUNTER — Other Ambulatory Visit: Payer: Self-pay

## 2018-09-18 VITALS — BP 140/71 | HR 82 | Temp 98.0°F | Resp 18 | Ht 67.5 in | Wt 177.1 lb

## 2018-09-18 DIAGNOSIS — C9110 Chronic lymphocytic leukemia of B-cell type not having achieved remission: Secondary | ICD-10-CM

## 2018-09-18 DIAGNOSIS — M069 Rheumatoid arthritis, unspecified: Secondary | ICD-10-CM

## 2018-09-18 LAB — CBC WITH DIFFERENTIAL (CANCER CENTER ONLY)
Abs Immature Granulocytes: 0.14 10*3/uL — ABNORMAL HIGH (ref 0.00–0.07)
Basophils Absolute: 0.1 10*3/uL (ref 0.0–0.1)
Basophils Relative: 1 %
Eosinophils Absolute: 0.4 10*3/uL (ref 0.0–0.5)
Eosinophils Relative: 5 %
HCT: 39.1 % (ref 36.0–46.0)
Hemoglobin: 12.2 g/dL (ref 12.0–15.0)
Immature Granulocytes: 2 %
Lymphocytes Relative: 43 %
Lymphs Abs: 3.2 10*3/uL (ref 0.7–4.0)
MCH: 28.2 pg (ref 26.0–34.0)
MCHC: 31.2 g/dL (ref 30.0–36.0)
MCV: 90.5 fL (ref 80.0–100.0)
Monocytes Absolute: 0.9 10*3/uL (ref 0.1–1.0)
Monocytes Relative: 12 %
Neutro Abs: 2.8 10*3/uL (ref 1.7–7.7)
Neutrophils Relative %: 37 %
Platelet Count: 343 10*3/uL (ref 150–400)
RBC: 4.32 MIL/uL (ref 3.87–5.11)
RDW: 13.6 % (ref 11.5–15.5)
WBC Count: 7.5 10*3/uL (ref 4.0–10.5)
nRBC: 0 % (ref 0.0–0.2)

## 2018-09-18 LAB — CMP (CANCER CENTER ONLY)
ALT: 23 U/L (ref 0–44)
AST: 18 U/L (ref 15–41)
Albumin: 3 g/dL — ABNORMAL LOW (ref 3.5–5.0)
Alkaline Phosphatase: 76 U/L (ref 38–126)
Anion gap: 7 (ref 5–15)
BUN: 12 mg/dL (ref 8–23)
CO2: 27 mmol/L (ref 22–32)
Calcium: 8.9 mg/dL (ref 8.9–10.3)
Chloride: 107 mmol/L (ref 98–111)
Creatinine: 0.83 mg/dL (ref 0.44–1.00)
GFR, Est AFR Am: >60 mL/min (ref >60)
GFR, Estimated: >60 mL/min (ref >60)
Glucose, Bld: 102 mg/dL — ABNORMAL HIGH (ref 70–99)
Potassium: 3.9 mmol/L (ref 3.5–5.1)
Sodium: 141 mmol/L (ref 135–145)
Total Bilirubin: 0.3 mg/dL (ref 0.3–1.2)
Total Protein: 6.4 g/dL — ABNORMAL LOW (ref 6.5–8.1)

## 2018-09-18 NOTE — Progress Notes (Signed)
Hematology and Oncology Follow Up Visit  Nicole Campbell [id number] 1945/10/11 73 y.o. 09/18/2018 9:50 AM Nicole Campbell, MDSmith, [name], MD   Principle Diagnosis: 73 year old woman with:  1. 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  She presented with lymphocytosis and lymphadenopathy with p53 deletion and deletion of ATM.  2.  Rheumatoid arthritis.  She is currently on oral prednisone only.  Prior therapy: She is status post rituximab weekly for 4 weeks completed on August 21, 2018.   Current therapy: Active surveillance.  Interim History: Nicole Campbell is here for a repeat evaluation.  Since the last visit, she completed 4 weeks of rituximab without any major complications.  She denies any issues related to this treatment including very little infusion related complications.  She denies any major changes in her rheumatoid arthritis and has improvement in her pain in her hands but still has some shoulder and elbow pain.  She denies any fevers, chills or sweats.  She denies any painful lymphadenopathy.  She denied any alteration mental status, neuropathy, confusion or dizziness.  Denies any headaches or lethargy.  Denies any night sweats, weight loss or changes in appetite.  Denied orthopnea, dyspnea on exertion or chest discomfort.  Denies shortness of breath, difficulty breathing hemoptysis or cough.  Denies any abdominal distention, nausea, early satiety or dyspepsia.  Denies any hematuria, frequency, dysuria or nocturia.  Denies any skin irritation, dryness or rash.  Denies any ecchymosis or petechiae.  Denies any lymphadenopathy or clotting.  Denies any heat or cold intolerance.  Denies any anxiety or depression.  Remaining review of system is negative.     Medications: I have reviewed the patient's current medications.  Current Outpatient Medications  Medication Sig Dispense Refill  . levothyroxine (SYNTHROID, LEVOTHROID) 25 MCG tablet     . predniSONE (DELTASONE) 5 MG tablet TAKE 3  TABLETS BY MOUTH ONCE DAILY FOR 3 DAYS THEN 2 ONCE DAILY FOR 3 DAYS THEN 1 ONCE DAILY     No current facility-administered medications for this visit.      Allergies:  Allergies  Allergen Reactions  . Erythromycin     Past Medical History, Surgical history, Social history, and Family History were reviewed and updated.    Physical Exam: Blood pressure 140/71, pulse 82, temperature 98 F (36.7 C), temperature source Oral, resp. rate 18, height 5' 7.5" (1.715 m), weight 177 lb 1.6 oz (80.3 kg), SpO2 100 %.   ECOG:1    General appearance: Comfortable appearing without any discomfort Head: Normocephalic without any trauma Oropharynx: Mucous membranes are moist and pink without any thrush or ulcers. Eyes: Pupils are equal and round reactive to light. Lymph nodes: No cervical, supraclavicular, inguinal or axillary lymphadenopathy.   Heart:regular rate and rhythm.  S1 and S2 without leg edema. Lung: Clear without any rhonchi or wheezes.  No dullness to percussion. Abdomin: Soft, nontender, nondistended with good bowel sounds.  No hepatosplenomegaly. Musculoskeletal: No joint deformity or effusion.  Full range of motion noted. Neurological: No deficits noted on motor, sensory and deep tendon reflex exam. Skin: No petechial rash or dryness.  Appeared moist.  Psychiatric: Mood and affect appeared appropriate.           Lab Results: Lab Results  Component Value Date   WBC 7.5 09/18/2018   HGB 12.2 09/18/2018   HCT 39.1 09/18/2018   MCV 90.5 09/18/2018   PLT 343 09/18/2018     Chemistry      Component Value Date/Time   NA 138 08/21/2018  0811   K 3.9 08/21/2018 0811   CL 106 08/21/2018 0811   CO2 24 08/21/2018 0811   BUN 12 08/21/2018 0811   CREATININE 0.79 08/21/2018 0811      Component Value Date/Time   CALCIUM 8.9 08/21/2018 0811   ALKPHOS 78 08/21/2018 0811   AST 13 (L) 08/21/2018 0811   ALT 15 08/21/2018 0811   BILITOT 0.4 08/21/2018 0811          Impression and Plan:   73 year old woman with:  1.  CLL diagnosed in November 2019 after presenting with adenopathy indicating stage I disease found to have p53 and ATM mutation.    She completed 4 weeks of rituximab without any major complications.  Her laboratory data were reviewed and showed normalization of her lymphocytosis.  At this time, I have recommended active surveillance without any additional treatment for her CLL.  She is asymptomatic with normal lymphocytosis.   2.  Rheumatoid arthritis: She completed a course of rituximab intended to treat her arthritis as well.  Continues to follow with rheumatology regarding this issue..    3.  Follow-up: We will be in 3 months to follow laboratory testing and repeat evaluation  15  minutes was spent with the patient face-to-face today.  More than 50% of time was dedicated to reviewing her disease status, laboratory data, treatment options and answering questions regarding future plan of care.Zola Button, MD 5/20/20209:50 AM

## 2018-10-28 DIAGNOSIS — Z79899 Other long term (current) drug therapy: Secondary | ICD-10-CM | POA: Diagnosis not present

## 2018-10-28 DIAGNOSIS — M069 Rheumatoid arthritis, unspecified: Secondary | ICD-10-CM | POA: Diagnosis not present

## 2018-10-30 ENCOUNTER — Ambulatory Visit
Admission: RE | Admit: 2018-10-30 | Discharge: 2018-10-30 | Disposition: A | Discharge status: Home / Self Care | Payer: PPO | Source: Ambulatory Visit | Attending: Family Medicine | Admitting: Family Medicine

## 2018-10-30 ENCOUNTER — Other Ambulatory Visit: Payer: Self-pay

## 2018-10-30 DIAGNOSIS — Z1231 Encounter for screening mammogram for malignant neoplasm of breast: Secondary | ICD-10-CM

## 2018-12-04 ENCOUNTER — Encounter (HOSPITAL_COMMUNITY): Payer: Self-pay | Admitting: Emergency Medicine

## 2018-12-04 ENCOUNTER — Other Ambulatory Visit: Payer: Self-pay

## 2018-12-04 ENCOUNTER — Emergency Department (HOSPITAL_COMMUNITY)
Admission: EM | Admit: 2018-12-04 | Discharge: 2018-12-05 | Disposition: A | Discharge status: Home / Self Care | Payer: PPO | Attending: Emergency Medicine | Admitting: Emergency Medicine

## 2018-12-04 DIAGNOSIS — Z79899 Other long term (current) drug therapy: Secondary | ICD-10-CM | POA: Diagnosis not present

## 2018-12-04 DIAGNOSIS — C9110 Chronic lymphocytic leukemia of B-cell type not having achieved remission: Secondary | ICD-10-CM | POA: Insufficient documentation

## 2018-12-04 DIAGNOSIS — M069 Rheumatoid arthritis, unspecified: Secondary | ICD-10-CM | POA: Diagnosis not present

## 2018-12-04 DIAGNOSIS — M62830 Muscle spasm of back: Secondary | ICD-10-CM | POA: Insufficient documentation

## 2018-12-04 DIAGNOSIS — M545 Low back pain: Secondary | ICD-10-CM | POA: Diagnosis present

## 2018-12-04 LAB — COMPREHENSIVE METABOLIC PANEL
ALT: 18 U/L (ref 0–44)
AST: 19 U/L (ref 15–41)
Albumin: 3.8 g/dL (ref 3.5–5.0)
Alkaline Phosphatase: 67 U/L (ref 38–126)
Anion gap: 11 (ref 5–15)
BUN: 17 mg/dL (ref 8–23)
CO2: 25 mmol/L (ref 22–32)
Calcium: 9.1 mg/dL (ref 8.9–10.3)
Chloride: 104 mmol/L (ref 98–111)
Creatinine, Ser: 0.89 mg/dL (ref 0.44–1.00)
GFR calc Af Amer: >60 mL/min (ref >60)
GFR calc non Af Amer: >60 mL/min (ref >60)
Glucose, Bld: 162 mg/dL — ABNORMAL HIGH (ref 70–99)
Potassium: 3.7 mmol/L (ref 3.5–5.1)
Sodium: 140 mmol/L (ref 135–145)
Total Bilirubin: 0.5 mg/dL (ref 0.3–1.2)
Total Protein: 6.7 g/dL (ref 6.5–8.1)

## 2018-12-04 LAB — CBC
HCT: 42.2 % (ref 36.0–46.0)
Hemoglobin: 13.3 g/dL (ref 12.0–15.0)
MCH: 29.5 pg (ref 26.0–34.0)
MCHC: 31.5 g/dL (ref 30.0–36.0)
MCV: 93.6 fL (ref 80.0–100.0)
Platelets: 202 10*3/uL (ref 150–400)
RBC: 4.51 MIL/uL (ref 3.87–5.11)
RDW: 17.3 % — ABNORMAL HIGH (ref 11.5–15.5)
WBC: 25.3 10*3/uL — ABNORMAL HIGH (ref 4.0–10.5)
nRBC: 0 % (ref 0.0–0.2)

## 2018-12-04 LAB — LIPASE, BLOOD: Lipase: 25 U/L (ref 11–51)

## 2018-12-04 MED ORDER — SODIUM CHLORIDE 0.9% FLUSH
3.0000 mL | Freq: Once | INTRAVENOUS | Status: AC
  Administered 2018-12-05: 3 mL via INTRAVENOUS

## 2018-12-04 MED ORDER — ONDANSETRON 4 MG PO TBDP
4.0000 mg | ORAL_TABLET | Freq: Once | ORAL | Status: AC | PRN
  Administered 2018-12-04: 4 mg via ORAL
  Filled 2018-12-04: qty 1

## 2018-12-04 MED ORDER — LORAZEPAM 2 MG/ML IJ SOLN
1.0000 mg | Freq: Once | INTRAMUSCULAR | Status: AC
  Administered 2018-12-05: 1 mg via INTRAVENOUS
  Filled 2018-12-04: qty 1

## 2018-12-04 NOTE — ED Triage Notes (Signed)
Patient complaining of back pain and vomiting. Patient states that's she has RA. She states the pain started after she moved some chairs at the house. Patient does not know why she is so nauseas.

## 2018-12-04 NOTE — ED Provider Notes (Signed)
[facility] DEPT Provider Note: Georgena Spurling, MD, FACEP  CSN: [id number] MRN: [id number] ARRIVAL: 12/04/18 at 2048 ROOM: [place]  Back Pain and Vomiting   HISTORY OF PRESENT ILLNESS  12/04/18 11:27 PM Nicole Campbell is a 73 y.o. female with CLL, for which she has not been treated since April, and rheumatoid arthritis, for which she takes prednisone and methotrexate.  She was active in her house earlier today moving furniture and cleaning.  Or later she developed pain in her lower back.  She describes the pain is cramping or spasm-like.  It is across her lower back and radiating around to her sides.  She rates it as a 10 out of 10.  She has taken ibuprofen without relief.  About 2 hours after taking the ibuprofen she developed nausea and vomiting.  She denies associated abdominal pain or fever.  She denies diarrhea.  She states the pain in her back is constant and not significantly affected by movement or position.  She was given Zofran ODT in triage with mild improvement.    History reviewed. No pertinent past medical history.  History reviewed. No pertinent surgical history.  History reviewed. No pertinent family history.  Social History   Tobacco Use  . Smoking status: Unknown If Ever Smoked  . Smokeless tobacco: Never Used  Substance Use Topics  . Alcohol use: Not on file  . Drug use: Not on file    Prior to Admission medications   Medication Sig Start Date End Date Taking? Authorizing Provider  folic acid (FOLVITE) 1 MG tablet Take 1 mg by mouth daily. 10/21/18  Yes [provider]  ibuprofen (ADVIL) 200 MG tablet Take 200 mg by mouth 2 (two) times daily.   Yes [provider]  levothyroxine (SYNTHROID, LEVOTHROID) 25 MCG tablet Take 25 mcg by mouth daily before breakfast.  04/30/17  Yes [provider]  methotrexate 2.5 MG tablet Take 15 mg by mouth once a week. 11/11/18  Yes [provider]    Allergies  Erythromycin   REVIEW OF SYSTEMS  Negative except as noted here or in the History of Present Illness.   PHYSICAL EXAMINATION  Initial Vital Signs Blood pressure (!) 141/76, pulse 96, temperature 97.6 F (36.4 C), temperature source Oral, resp. rate 16, height 5\' 6"  (1.676 m), weight 81.6 kg, SpO2 96 %.  Examination General: Well-developed, well-nourished female in no acute distress; appearance consistent with age of record HENT: normocephalic; atraumatic Eyes: Normal appearance Neck: supple Heart: regular rate and rhythm Lungs: clear to auscultation bilaterally Abdomen: soft; nondistended; nontender; no masses or hepatosplenomegaly; bowel sounds present Back: Lumbar paraspinal muscle tenderness; no spinal tenderness Extremities: No deformity; full range of motion; pulses normal Neurologic: Awake, alert and oriented; motor function intact in all extremities and symmetric; sensation intact and symmetric in lower extremities; no facial droop Skin: Warm and dry Psychiatric: Grimacing   RESULTS  Summary of this visit's results, reviewed by myself:   EKG Interpretation  Date/Time:    Ventricular Rate:    PR Interval:    QRS Duration:   QT Interval:    QTC Calculation:   R Axis:     Text Interpretation:        Laboratory Studies: Results for orders placed or performed during the hospital encounter of 12/04/18 (from the past 24 hour(s))  Lipase, blood     Status: None   Collection Time: 12/04/18  9:48 PM  Result Value Ref Range  Lipase 25 11 - 51 U/L  Comprehensive metabolic panel     Status: Abnormal   Collection Time: 12/04/18  9:48 PM  Result Value Ref Range   Sodium 140 135 - 145 mmol/L   Potassium 3.7 3.5 - 5.1 mmol/L   Chloride 104 98 - 111 mmol/L   CO2 25 22 - 32 mmol/L   Glucose, Bld 162 (H) 70 - 99 mg/dL   BUN 17 8 - 23 mg/dL   Creatinine, Ser 0.89 0.44 - 1.00 mg/dL   Calcium 9.1 8.9 - 10.3 mg/dL   Total Protein 6.7 6.5 - 8.1 g/dL   Albumin 3.8 3.5 -  5.0 g/dL   AST 19 15 - 41 U/L   ALT 18 0 - 44 U/L   Alkaline Phosphatase 67 38 - 126 U/L   Total Bilirubin 0.5 0.3 - 1.2 mg/dL   GFR calc non Af Amer >60 >60 mL/min   GFR calc Af Amer >60 >60 mL/min   Anion gap 11 5 - 15  CBC     Status: Abnormal   Collection Time: 12/04/18  9:48 PM  Result Value Ref Range   WBC 25.3 (H) 4.0 - 10.5 K/uL   RBC 4.51 3.87 - 5.11 MIL/uL   Hemoglobin 13.3 12.0 - 15.0 g/dL   HCT 42.2 36.0 - 46.0 %   MCV 93.6 80.0 - 100.0 fL   MCH 29.5 26.0 - 34.0 pg   MCHC 31.5 30.0 - 36.0 g/dL   RDW 17.3 (H) 11.5 - 15.5 %   Platelets 202 150 - 400 K/uL   nRBC 0.0 0.0 - 0.2 %  Differential     Status: Abnormal   Collection Time: 12/04/18  9:48 PM  Result Value Ref Range   Neutrophils Relative % 31 %   Neutro Abs 7.8 (H) 1.7 - 7.7 K/uL   Lymphocytes Relative 60 %   Lymphs Abs 15.1 (H) 0.7 - 4.0 K/uL   Monocytes Relative 5 %   Monocytes Absolute 1.2 (H) 0.1 - 1.0 K/uL   Eosinophils Relative 1 %   Eosinophils Absolute 0.3 0.0 - 0.5 K/uL   Basophils Relative 1 %   Basophils Absolute 0.2 (H) 0.0 - 0.1 K/uL   WBC Morphology SMUDGE CELLS   Urinalysis, Routine w reflex microscopic     Status: Abnormal   Collection Time: 12/04/18  9:49 PM  Result Value Ref Range   Color, Urine YELLOW YELLOW   APPearance CLEAR CLEAR   Specific Gravity, Urine 1.015 1.005 - 1.030   pH 6.0 5.0 - 8.0   Glucose, UA NEGATIVE NEGATIVE mg/dL   Hgb urine dipstick NEGATIVE NEGATIVE   Bilirubin Urine NEGATIVE NEGATIVE   Ketones, ur NEGATIVE NEGATIVE mg/dL   Protein, ur NEGATIVE NEGATIVE mg/dL   Nitrite NEGATIVE NEGATIVE   Leukocytes,Ua SMALL (A) NEGATIVE   RBC / HPF 0-5 0 - 5 RBC/hpf   WBC, UA 11-20 0 - 5 WBC/hpf   Bacteria, UA NONE SEEN NONE SEEN   Squamous Epithelial / LPF 0-5 0 - 5   Mucus PRESENT    Hyaline Casts, UA PRESENT    Imaging Studies: No results found.  ED COURSE and MDM  Nursing notes and initial vitals signs, including pulse oximetry, reviewed.  Vitals:   12/04/18  2117 12/05/18 0103 12/05/18 0210 12/05/18 0230  BP: (!) 141/76 (!) 171/80 (!) 145/85 (!) 156/82  Pulse: 96 95 85 80  Resp: 16 16 16    Temp: 97.6 F (36.4 C)  98 F (36.7  C)   TempSrc: Oral  Oral   SpO2: 96% 99% 97% 98%  Weight: 81.6 kg     Height: 5\' 6"  (1.676 m)      3:11 AM Patient's pain significantly improved after Ativan 1 mg IV.  Her paraspinous back musculature is less tender.  She has been sleeping comfortably.  The patient's leukocytosis primarily consists of lymphocytes with smudge cells which is consistent with her known CLL.  Urine sent for culture although the patient is not complaining of dysuria.   PROCEDURES    ED DIAGNOSES     ICD-10-CM   1. Lumbar paraspinal muscle spasm  M62.830   2. CLL (chronic lymphocytic leukemia) ([place])  C91.10        Nikolus Marczak, Jenny Reichmann, MD 12/05/18 [phone]

## 2018-12-05 ENCOUNTER — Telehealth: Payer: Self-pay | Admitting: Oncology

## 2018-12-05 DIAGNOSIS — C9110 Chronic lymphocytic leukemia of B-cell type not having achieved remission: Secondary | ICD-10-CM | POA: Diagnosis not present

## 2018-12-05 DIAGNOSIS — Z6828 Body mass index (BMI) 28.0-28.9, adult: Secondary | ICD-10-CM | POA: Diagnosis not present

## 2018-12-05 DIAGNOSIS — Z7189 Other specified counseling: Secondary | ICD-10-CM | POA: Diagnosis not present

## 2018-12-05 DIAGNOSIS — M069 Rheumatoid arthritis, unspecified: Secondary | ICD-10-CM | POA: Diagnosis not present

## 2018-12-05 DIAGNOSIS — R5383 Other fatigue: Secondary | ICD-10-CM | POA: Diagnosis not present

## 2018-12-05 DIAGNOSIS — Z79899 Other long term (current) drug therapy: Secondary | ICD-10-CM | POA: Diagnosis not present

## 2018-12-05 DIAGNOSIS — E663 Overweight: Secondary | ICD-10-CM | POA: Diagnosis not present

## 2018-12-05 DIAGNOSIS — L659 Nonscarring hair loss, unspecified: Secondary | ICD-10-CM | POA: Diagnosis not present

## 2018-12-05 LAB — URINALYSIS, ROUTINE W REFLEX MICROSCOPIC
Bacteria, UA: NONE SEEN
Bilirubin Urine: NEGATIVE
Glucose, UA: NEGATIVE mg/dL
Hgb urine dipstick: NEGATIVE
Ketones, ur: NEGATIVE mg/dL
Nitrite: NEGATIVE
Protein, ur: NEGATIVE mg/dL
Specific Gravity, Urine: 1.015 (ref 1.005–1.030)
pH: 6 (ref 5.0–8.0)

## 2018-12-05 LAB — DIFFERENTIAL
Basophils Absolute: 0.2 10*3/uL — ABNORMAL HIGH (ref 0.0–0.1)
Basophils Relative: 1 %
Eosinophils Absolute: 0.3 10*3/uL (ref 0.0–0.5)
Eosinophils Relative: 1 %
Lymphocytes Relative: 60 %
Lymphs Abs: 15.1 10*3/uL — ABNORMAL HIGH (ref 0.7–4.0)
Monocytes Absolute: 1.2 10*3/uL — ABNORMAL HIGH (ref 0.1–1.0)
Monocytes Relative: 5 %
Neutro Abs: 7.8 10*3/uL — ABNORMAL HIGH (ref 1.7–7.7)
Neutrophils Relative %: 31 %

## 2018-12-05 MED ORDER — DIAZEPAM 5 MG PO TABS: 2.5000 mg | ORAL_TABLET | Freq: Three times a day (TID) | ORAL | 0 refills | Status: DC | PRN

## 2018-12-05 NOTE — Telephone Encounter (Signed)
Confirmed 8/14 lab/fu with patient.

## 2018-12-05 NOTE — ED Notes (Signed)
Lqab Alexis called CBC w/ diff added

## 2018-12-06 LAB — URINE CULTURE: Culture: 30000 — AB

## 2018-12-07 ENCOUNTER — Telehealth: Payer: Self-pay | Admitting: Emergency Medicine

## 2018-12-07 NOTE — Telephone Encounter (Signed)
Post ED Visit - Positive Culture Follow-up  Culture report reviewed by antimicrobial stewardship pharmacist: [place] Team []  Elenor Quinones, Pharm.D. []  Heide Guile, Pharm.D., BCPS AQ-ID []  Parks Neptune, Pharm.D., BCPS []  Alycia Rossetti, Pharm.D., BCPS []  [place], Pharm.D., BCPS, AAHIVP []  Legrand Como, Pharm.D., BCPS, AAHIVP []  Salome Arnt, PharmD, BCPS []  Johnnette Gourd, PharmD, BCPS []  Hughes Better, PharmD, BCPS []  Leeroy Cha, PharmD []  Laqueta Linden, PharmD, BCPS []  Albertina Parr, PharmD  [place] Team []  Leodis Sias, PharmD []  Lindell Spar, PharmD []  Royetta Asal, PharmD []  Graylin Shiver, Rph []  Rema Fendt) Glennon Mac, PharmD []  Arlyn Dunning, PharmD []  Netta Cedars, PharmD [x]  Dia Sitter, PharmD []  Leone Haven, PharmD []  Gretta Arab, PharmD []  Theodis Shove, PharmD []  Peggyann Juba, PharmD []  Reuel Boom, PharmD   Positive urine culture No further patient follow-up is required at this time.  Sandi Raveling Chaney Maclaren 12/07/2018, 3:06 PM

## 2018-12-11 ENCOUNTER — Other Ambulatory Visit: Payer: Self-pay

## 2018-12-11 ENCOUNTER — Encounter (HOSPITAL_COMMUNITY): Payer: Self-pay | Admitting: Emergency Medicine

## 2018-12-11 DIAGNOSIS — R079 Chest pain, unspecified: Secondary | ICD-10-CM | POA: Diagnosis not present

## 2018-12-11 DIAGNOSIS — C9110 Chronic lymphocytic leukemia of B-cell type not having achieved remission: Secondary | ICD-10-CM | POA: Insufficient documentation

## 2018-12-11 DIAGNOSIS — T50904A Poisoning by unspecified drugs, medicaments and biological substances, undetermined, initial encounter: Secondary | ICD-10-CM | POA: Diagnosis not present

## 2018-12-11 DIAGNOSIS — R52 Pain, unspecified: Secondary | ICD-10-CM | POA: Diagnosis not present

## 2018-12-11 DIAGNOSIS — I10 Essential (primary) hypertension: Secondary | ICD-10-CM | POA: Diagnosis not present

## 2018-12-11 DIAGNOSIS — M5489 Other dorsalgia: Secondary | ICD-10-CM | POA: Diagnosis not present

## 2018-12-11 DIAGNOSIS — M545 Low back pain: Secondary | ICD-10-CM | POA: Diagnosis not present

## 2018-12-11 DIAGNOSIS — M549 Dorsalgia, unspecified: Secondary | ICD-10-CM | POA: Diagnosis not present

## 2018-12-11 DIAGNOSIS — T887XXA Unspecified adverse effect of drug or medicament, initial encounter: Secondary | ICD-10-CM | POA: Diagnosis not present

## 2018-12-11 DIAGNOSIS — R590 Localized enlarged lymph nodes: Secondary | ICD-10-CM | POA: Diagnosis not present

## 2018-12-11 NOTE — ED Triage Notes (Signed)
Patient is complaining of back pain when she takes methotrexate. Patient states that the methotrexate was increased. Patient has not called her doctor to address the problem.

## 2018-12-12 ENCOUNTER — Emergency Department (HOSPITAL_COMMUNITY): Payer: PPO

## 2018-12-12 ENCOUNTER — Encounter (HOSPITAL_COMMUNITY): Payer: Self-pay

## 2018-12-12 ENCOUNTER — Emergency Department (HOSPITAL_COMMUNITY)
Admission: EM | Admit: 2018-12-12 | Discharge: 2018-12-12 | Disposition: A | Discharge status: Home / Self Care | Payer: PPO | Attending: Emergency Medicine | Admitting: Emergency Medicine

## 2018-12-12 DIAGNOSIS — M545 Low back pain, unspecified: Secondary | ICD-10-CM

## 2018-12-12 DIAGNOSIS — R590 Localized enlarged lymph nodes: Secondary | ICD-10-CM | POA: Diagnosis not present

## 2018-12-12 DIAGNOSIS — R079 Chest pain, unspecified: Secondary | ICD-10-CM | POA: Diagnosis not present

## 2018-12-12 LAB — CBC WITH DIFFERENTIAL/PLATELET
Abs Immature Granulocytes: 0.51 10*3/uL — ABNORMAL HIGH (ref 0.00–0.07)
Basophils Absolute: 0.2 10*3/uL — ABNORMAL HIGH (ref 0.0–0.1)
Basophils Relative: 1 %
Eosinophils Absolute: 0.1 10*3/uL (ref 0.0–0.5)
Eosinophils Relative: 0 %
HCT: 42.9 % (ref 36.0–46.0)
Hemoglobin: 13.6 g/dL (ref 12.0–15.0)
Immature Granulocytes: 2 %
Lymphocytes Relative: 55 %
Lymphs Abs: 16.8 10*3/uL — ABNORMAL HIGH (ref 0.7–4.0)
MCH: 29.2 pg (ref 26.0–34.0)
MCHC: 31.7 g/dL (ref 30.0–36.0)
MCV: 92.3 fL (ref 80.0–100.0)
Monocytes Absolute: 1.2 10*3/uL — ABNORMAL HIGH (ref 0.1–1.0)
Monocytes Relative: 4 %
Neutro Abs: 11.3 10*3/uL — ABNORMAL HIGH (ref 1.7–7.7)
Neutrophils Relative %: 38 %
Platelets: 222 10*3/uL (ref 150–400)
RBC: 4.65 MIL/uL (ref 3.87–5.11)
RDW: 17.8 % — ABNORMAL HIGH (ref 11.5–15.5)
WBC Morphology: ABNORMAL
WBC: 30.1 10*3/uL — ABNORMAL HIGH (ref 4.0–10.5)
nRBC: 0 % (ref 0.0–0.2)

## 2018-12-12 LAB — URINALYSIS, ROUTINE W REFLEX MICROSCOPIC
Bacteria, UA: NONE SEEN
Bilirubin Urine: NEGATIVE
Glucose, UA: 50 mg/dL — AB
Ketones, ur: NEGATIVE mg/dL
Nitrite: NEGATIVE
Protein, ur: NEGATIVE mg/dL
Specific Gravity, Urine: 1.019 (ref 1.005–1.030)
pH: 7 (ref 5.0–8.0)

## 2018-12-12 LAB — BASIC METABOLIC PANEL
Anion gap: 11 (ref 5–15)
BUN: 19 mg/dL (ref 8–23)
CO2: 25 mmol/L (ref 22–32)
Calcium: 9.4 mg/dL (ref 8.9–10.3)
Chloride: 103 mmol/L (ref 98–111)
Creatinine, Ser: 0.94 mg/dL (ref 0.44–1.00)
GFR calc Af Amer: >60 mL/min (ref >60)
GFR calc non Af Amer: >60 mL/min (ref >60)
Glucose, Bld: 217 mg/dL — ABNORMAL HIGH (ref 70–99)
Potassium: 3.5 mmol/L (ref 3.5–5.1)
Sodium: 139 mmol/L (ref 135–145)

## 2018-12-12 MED ORDER — ONDANSETRON HCL 4 MG/2ML IJ SOLN
4.0000 mg | Freq: Once | INTRAMUSCULAR | Status: AC
  Administered 2018-12-12: 4 mg via INTRAVENOUS
  Filled 2018-12-12: qty 2

## 2018-12-12 MED ORDER — HYDROCODONE-ACETAMINOPHEN 5-325 MG PO TABS: 1.0000 | ORAL_TABLET | ORAL | 0 refills | Status: DC | PRN

## 2018-12-12 MED ORDER — IOHEXOL 350 MG/ML SOLN
100.0000 mL | Freq: Once | INTRAVENOUS | Status: AC | PRN
  Administered 2018-12-12: 100 mL via INTRAVENOUS

## 2018-12-12 MED ORDER — SODIUM CHLORIDE (PF) 0.9 % IJ SOLN
INTRAMUSCULAR | Status: AC
  Filled 2018-12-12: qty 50

## 2018-12-12 MED ORDER — LORAZEPAM 2 MG/ML IJ SOLN
1.0000 mg | Freq: Once | INTRAMUSCULAR | Status: AC
  Administered 2018-12-12: 1 mg via INTRAVENOUS
  Filled 2018-12-12: qty 1

## 2018-12-12 MED ORDER — ONDANSETRON 4 MG PO TBDP: 4.0000 mg | ORAL_TABLET | Freq: Four times a day (QID) | ORAL | 0 refills | Status: DC | PRN

## 2018-12-12 MED ORDER — SODIUM CHLORIDE 0.9 % IV BOLUS (SEPSIS)
1000.0000 mL | Freq: Once | INTRAVENOUS | Status: AC
  Administered 2018-12-12: 1000 mL via INTRAVENOUS

## 2018-12-12 MED ORDER — FENTANYL CITRATE (PF) 100 MCG/2ML IJ SOLN
50.0000 ug | Freq: Once | INTRAMUSCULAR | Status: AC
  Administered 2018-12-12: 50 ug via INTRAVENOUS
  Filled 2018-12-12: qty 2

## 2018-12-12 MED ORDER — MORPHINE SULFATE (PF) 4 MG/ML IV SOLN
4.0000 mg | Freq: Once | INTRAVENOUS | Status: AC
  Administered 2018-12-12: 4 mg via INTRAVENOUS
  Filled 2018-12-12: qty 1

## 2018-12-12 NOTE — ED Notes (Addendum)
Pt ambulated to the bathroom without assistance. Gait steady  

## 2018-12-12 NOTE — ED Provider Notes (Addendum)
TIME SEEN: 12:23 AM  CHIEF COMPLAINT: Severe back pain  HPI: Patient is a 73 year old female with history of CLL, rheumatoid arthritis on methotrexate and prednisone who presents to the emergency department with severe back pain that radiates into her abdomen that started tonight.  She states she is not able to reproduce pain with palpation.  She thinks that it is worse with movement but is not sure.  She states she was here a week ago for similar symptoms but states "this feels much worse".  When she was seen here on 12/04/2018, she had remembered lifting something heavy that exacerbated her symptoms and symptoms improved with Ativan.  She states she denies any known injury or increased physical exertion that contributed to this pain.  Denies numbness, tingling or focal weakness.  No bowel or bladder incontinence.  No fever.  Patient arrives by EMS.  ROS: See HPI Constitutional: no fever  Eyes: no drainage  ENT: no runny nose   Cardiovascular:  no chest pain  Resp: no SOB  GI: no vomiting GU: no dysuria Integumentary: no rash  Allergy: no hives  Musculoskeletal: no leg swelling  Neurological: no slurred speech ROS otherwise negative  PAST MEDICAL HISTORY/PAST SURGICAL HISTORY:  CLL RA  MEDICATIONS:  Prior to Admission medications   Medication Sig Start Date End Date Taking? Authorizing Provider  diazepam (VALIUM) 5 MG tablet Take 0.5 tablets (2.5 mg total) by mouth every 8 (eight) hours as needed for muscle spasms (may cause drowsiness). 12/05/18   Molpus, John, MD  folic acid (FOLVITE) 1 MG tablet Take 1 mg by mouth daily. 10/21/18   [provider]  ibuprofen (ADVIL) 200 MG tablet Take 200 mg by mouth 2 (two) times daily.    [provider]  levothyroxine (SYNTHROID, LEVOTHROID) 25 MCG tablet Take 25 mcg by mouth daily before breakfast.  04/30/17   [provider]  methotrexate 2.5 MG tablet Take 15 mg by mouth once a week. 11/11/18   [provider]     ALLERGIES:  Allergies  Allergen Reactions  . Erythromycin Palpitations    SOCIAL HISTORY:  Social History   Tobacco Use  . Smoking status: Unknown If Ever Smoked  . Smokeless tobacco: Never Used  Substance Use Topics  . Alcohol use: Not on file    FAMILY HISTORY: History reviewed. No pertinent family history.  EXAM: BP (!) 199/96   Pulse 98   Temp 98.2 F (36.8 C) (Oral)   Resp 16   Ht 5\' 6"  (1.676 m)   Wt 81.6 kg   SpO2 100%   BMI 29.05 kg/m  CONSTITUTIONAL: Alert and oriented and responds appropriately to questions.  Elderly, appears very uncomfortable, tearful, sitting upright and leaning over in the bed HEAD: Normocephalic EYES: Conjunctivae clear, pupils appear equal, EOMI ENT: normal nose; moist mucous membranes NECK: Supple, no meningismus, no nuchal rigidity, no LAD  CARD: RRR; S1 and S2 appreciated; no murmurs, no clicks, no rubs, no gallops RESP: Normal chest excursion without splinting or tachypnea; breath sounds clear and equal bilaterally; no wheezes, no rhonchi, no rales, no hypoxia or respiratory distress, speaking full sentences ABD/GI: Normal bowel sounds; non-distended; soft, non-tender, no rebound, no guarding, no peritoneal signs, no hepatosplenomegaly BACK:  The back appears normal and is non-tender to palpation, there is no CVA tenderness, no midline step-off or deformity, no ecchymosis or swelling, no redness or warmth, no rashes or lesions, unable to reproduce pain with palpation EXT: Normal ROM in all joints; non-tender  to palpation; no edema; normal capillary refill; no cyanosis, no calf tenderness or swelling    SKIN: Normal color for age and race; warm; no rash NEURO: Moves all extremities equally, reports normal sensation diffusely, normal gait, unable to test strength at this time given patient's severe back pain PSYCH: The patient's mood and manner are appropriate. Grooming and personal hygiene are appropriate.  MEDICAL DECISION MAKING:  Patient here with severe back pain that radiates into her abdomen.  She is very hypertensive here.  Differential includes pathologic fracture, muscle spasm, dissection, kidney stone, pyelonephritis.  Her abdominal exam is currently benign.  She appears very uncomfortable.  I feel she needs imaging of her back and will obtain a dissection study given she is hypertensive and appears extremely uncomfortable today with pain that is not reproducible.  Will give pain and nausea medicine.  Will check labs, urine.  ED PROGRESS: Patient has a white blood cell count of 30,000 consistent with her CLL.  Urine shows small leukocytes and 6-10 white blood cells but no bacteria.  She is not having urinary symptoms.  Will send urine culture but I do not feel this needs treatment.  I do not think this is the cause of her symptoms today.  CT scan shows no acute pathology.  No aneurysm or dissection.  No bony abnormality noted.  States minimal improvement with fentanyl and morphine.  States Ativan helped her previously and is requesting a dose of Ativan here.  Will give IV Ativan and reassess.   Patient reports feeling better.  Able to ambulate without difficulty.  Will discharge home.  Will have her husband pick her up from the ED.  Will discharge with prescription of Vicodin.    At this time, I do not feel there is any life-threatening condition present. I have reviewed and discussed all results (EKG, imaging, lab, urine as appropriate) and exam findings with patient/family. I have reviewed nursing notes and appropriate previous records.  I feel the patient is safe to be discharged home without further emergent workup and can continue workup as an outpatient as needed. Discussed usual and customary return precautions. Patient/family verbalize understanding and are comfortable with this plan.  Outpatient follow-up has been provided as needed. All questions have been answered.       Sloane Junkin, Delice Bison, DO 12/12/18 [phone]

## 2018-12-12 NOTE — ED Notes (Signed)
Pt to CT

## 2018-12-12 NOTE — ED Notes (Signed)
Husband Iona Beard) contacted to pick pt up

## 2018-12-12 NOTE — ED Notes (Signed)
Pt back from radiology 

## 2018-12-12 NOTE — ED Notes (Signed)
Pt and husband verbalized discharge instructions and follow up care. Alert and ambulatory. Assisted into vehicle. No IV. Husband picked pt up

## 2018-12-12 NOTE — ED Notes (Signed)
Patient transported to CT 

## 2018-12-12 NOTE — Discharge Instructions (Signed)

## 2018-12-13 ENCOUNTER — Inpatient Hospital Stay: Payer: PPO | Attending: Oncology | Admitting: Oncology

## 2018-12-13 ENCOUNTER — Inpatient Hospital Stay: Payer: PPO

## 2018-12-13 ENCOUNTER — Other Ambulatory Visit: Payer: Self-pay

## 2018-12-13 VITALS — BP 139/75 | HR 108 | Temp 98.7°F | Resp 18 | Ht 66.0 in | Wt 177.2 lb

## 2018-12-13 DIAGNOSIS — C9110 Chronic lymphocytic leukemia of B-cell type not having achieved remission: Secondary | ICD-10-CM | POA: Insufficient documentation

## 2018-12-13 DIAGNOSIS — M069 Rheumatoid arthritis, unspecified: Secondary | ICD-10-CM | POA: Diagnosis not present

## 2018-12-13 LAB — CMP (CANCER CENTER ONLY)
ALT: 21 U/L (ref 0–44)
AST: 21 U/L (ref 15–41)
Albumin: 3.6 g/dL (ref 3.5–5.0)
Alkaline Phosphatase: 76 U/L (ref 38–126)
Anion gap: 10 (ref 5–15)
BUN: 16 mg/dL (ref 8–23)
CO2: 26 mmol/L (ref 22–32)
Calcium: 9.5 mg/dL (ref 8.9–10.3)
Chloride: 103 mmol/L (ref 98–111)
Creatinine: 1.06 mg/dL — ABNORMAL HIGH (ref 0.44–1.00)
GFR, Est AFR Am: >60 mL/min (ref >60)
GFR, Estimated: 53 mL/min — ABNORMAL LOW (ref >60)
Glucose, Bld: 97 mg/dL (ref 70–99)
Potassium: 3.9 mmol/L (ref 3.5–5.1)
Sodium: 139 mmol/L (ref 135–145)
Total Bilirubin: 0.7 mg/dL (ref 0.3–1.2)
Total Protein: 6.6 g/dL (ref 6.5–8.1)

## 2018-12-13 LAB — CBC WITH DIFFERENTIAL (CANCER CENTER ONLY)
Abs Immature Granulocytes: 0.16 10*3/uL — ABNORMAL HIGH (ref 0.00–0.07)
Basophils Absolute: 0.2 10*3/uL — ABNORMAL HIGH (ref 0.0–0.1)
Basophils Relative: 1 %
Eosinophils Absolute: 0.2 10*3/uL (ref 0.0–0.5)
Eosinophils Relative: 1 %
HCT: 42.9 % (ref 36.0–46.0)
Hemoglobin: 13.5 g/dL (ref 12.0–15.0)
Immature Granulocytes: 1 %
Lymphocytes Relative: 70 %
Lymphs Abs: 19.8 10*3/uL — ABNORMAL HIGH (ref 0.7–4.0)
MCH: 28.7 pg (ref 26.0–34.0)
MCHC: 31.5 g/dL (ref 30.0–36.0)
MCV: 91.1 fL (ref 80.0–100.0)
Monocytes Absolute: 0.7 10*3/uL (ref 0.1–1.0)
Monocytes Relative: 3 %
Neutro Abs: 6.7 10*3/uL (ref 1.7–7.7)
Neutrophils Relative %: 24 %
Platelet Count: 198 10*3/uL (ref 150–400)
RBC: 4.71 MIL/uL (ref 3.87–5.11)
RDW: 17.8 % — ABNORMAL HIGH (ref 11.5–15.5)
WBC Count: 27.7 10*3/uL — ABNORMAL HIGH (ref 4.0–10.5)
nRBC: 0.1 % (ref 0.0–0.2)

## 2018-12-13 NOTE — Progress Notes (Signed)
Hematology and Oncology Follow Up Visit  Nicole Campbell 73 y.o. 12/13/2018 9:48 AM Nicole Campbell, MDSmith, [name], MD   Principle Diagnosis: 73 year old woman with:  1. 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   2.  Rheumatoid arthritis.  She is currently on oral prednisone only.  Prior therapy: She is status post rituximab weekly for 4 weeks completed on August 21, 2018.   Current therapy: Active surveillance.  Interim History: Nicole Campbell returns today for a follow-up visit.  Since her last visit, she reports he starting on higher doses of methotrexate and had to go to the emergency department on 2 separate occasions.  She presented on August 5 with increased pain and a CBC at that time showed a white cell count 25,000.  Her work-up was unrevealing and she had to take methotrexate weekly dose on last Wednesday and the following day experienced similar issues with lower back pain associated with nausea and discomfort.  She was evaluated in the emergency department again on August 13.  Her work-up included a CBC that showed a white cell count of 30,000 and a CT scan chest abdomen and pelvis that did not show any acute abnormalities.  She did have some mild increase in her adenopathy but otherwise no other pathology.  Since last night, she reports feeling better with her pain has resolved at this time.  She denies any trauma or falls or any provoking factors.  The only change is her increased dose of methotrexate which she takes on Wednesday.  She denies any palpable adenopathy or constitutional symptoms.  Patient denied headaches, blurry vision, syncope or seizures.  Denies any fevers, chills or sweats.  Denied chest pain, palpitation, orthopnea or leg edema.  Denied cough, wheezing or hemoptysis.  Denied nausea, vomiting or abdominal pain.  Denies any  constipation or diarrhea.  Denies any frequency urgency or hesitancy.  Denies any arthralgias or myalgias.  Denies any skin rashes or lesions.  Denies any bleeding or clotting tendency.  Denies any easy bruising.  Denies any hair or nail changes.  Denies any anxiety or depression.  Remaining review of system is negative.    Medications: Updated on review. Current Outpatient Medications  Medication Sig Dispense Refill  . diazepam (VALIUM) 5 MG tablet Take 0.5 tablets (2.5 mg total) by mouth every 8 (eight) hours as needed for muscle spasms (may cause drowsiness). 10 tablet 0  . folic acid (FOLVITE) 1 MG tablet Take 1 mg by mouth daily.    Marland Kitchen HYDROcodone-acetaminophen (NORCO/VICODIN) 5-325 MG tablet Take 1 tablet by mouth every 4 (four) hours as needed. 15 tablet 0  . ibuprofen (ADVIL) 200 MG tablet Take 200 mg by mouth 2 (two) times daily.    Marland Kitchen levothyroxine (SYNTHROID, LEVOTHROID) 25 MCG tablet Take 25 mcg by mouth daily before breakfast.     . methotrexate 2.5 MG tablet Take 15 mg by mouth once a week.    . ondansetron (ZOFRAN ODT) 4 MG disintegrating tablet Take 1 tablet (4 mg total) by mouth every 6 (six) hours as needed for nausea or vomiting. 20 tablet 0   No current facility-administered medications for this visit.      Allergies:  Allergies  Allergen Reactions  . Erythromycin Palpitations    Past Medical History, Surgical history, Social history, and Family History without any changes on review.    Physical Exam: Blood  pressure 139/75, pulse (!) 108, temperature 98.7 F (37.1 C), resp. rate 18, height _0  (1.676 m), weight 177 lb 3.2 oz (80.4 kg), SpO2 97 %.    ECOG:1     General appearance: Alert, awake without any distress. Head: Atraumatic without abnormalities Oropharynx: Without any thrush or ulcers. Eyes: No scleral icterus. Lymph nodes: No lymphadenopathy noted in the cervical, supraclavicular, or axillary nodes Heart:regular rate and rhythm, without any  murmurs or gallops.   Lung: Clear to auscultation without any rhonchi, wheezes or dullness to percussion. Abdomin: Soft, nontender without any shifting dullness or ascites. Musculoskeletal: No clubbing or cyanosis. Neurological: No motor or sensory deficits. Skin: No rashes or lesions.            Lab Results: Lab Results  Component Value Date   WBC 30.1 (H) 12/12/2018   HGB 13.6 12/12/2018   HCT 42.9 12/12/2018   MCV 92.3 12/12/2018   PLT 222 12/12/2018     Chemistry      Component Value Date/Time   NA 139 12/12/2018 0104   K 3.5 12/12/2018 0104   CL 103 12/12/2018 0104   CO2 25 12/12/2018 0104   BUN 19 12/12/2018 0104   CREATININE 0.94 12/12/2018 0104   CREATININE 0.83 09/18/2018 0924      Component Value Date/Time   CALCIUM 9.4 12/12/2018 0104   ALKPHOS 67 12/04/2018 2148   AST 19 12/04/2018 2148   AST 18 09/18/2018 0924   ALT 18 12/04/2018 2148   ALT 23 09/18/2018 0924   BILITOT 0.5 12/04/2018 2148   BILITOT 0.3 09/18/2018 0924      CONTRAST:  172m OMNIPAQUE IOHEXOL 350 MG/ML SOLN  COMPARISON:  CT of the abdomen pelvis dated 03/26/2018.  FINDINGS: CTA CHEST FINDINGS  Cardiovascular: There is no cardiomegaly or pericardial effusion. The thoracic aorta is unremarkable. The origins of the great vessels of the aortic arch appear patent. No pulmonary artery embolus identified.  Mediastinum/Nodes: Mild bilateral hilar adenopathy measuring up to 13 mm in short axis in the right hilum. Subcarinal lymph node measures approximately 11 mm in short axis. There are bilateral axillary adenopathy, increased in size since the prior CT and measuring up to 12 mm in short axis the left axilla in keeping with history of lymphoproliferative disorder. The esophagus is grossly unremarkable. No mediastinal fluid collection.  Lungs/Pleura: Minimal bibasilar linear atelectasis/scarring. There is no focal consolidation, pleural effusion, or pneumothorax.  The central airways are patent.  Musculoskeletal: Advanced bilateral shoulder arthritic changes. No acute osseous pathology. Small calcific foci in the breast tissues bilaterally.  Review of the MIP images confirms the above findings.  CTA ABDOMEN AND PELVIS FINDINGS  VASCULAR  Aorta: Mild atherosclerotic calcification. No aneurysmal dilatation or dissection.  Celiac: Patent without evidence of aneurysm, dissection, vasculitis or significant stenosis.  SMA: Patent without evidence of aneurysm, dissection, vasculitis or significant stenosis.  Renals: Both renal arteries are patent without evidence of aneurysm, dissection, vasculitis, fibromuscular dysplasia or significant stenosis. Accessory renal arteries noted arising from the aorta inferior to the main renal artery bilaterally.  IMA: Patent without evidence of aneurysm, dissection, vasculitis or significant stenosis.  Inflow: Patent without evidence of aneurysm, dissection, vasculitis or significant stenosis.  Veins: No obvious venous abnormality within the limitations of this arterial phase study.  Review of the MIP images confirms the above findings.  NON-VASCULAR  No intra-abdominal free air or free fluid.  Hepatobiliary: Apparent fatty infiltration of the liver. No intrahepatic biliary ductal dilatation. The gallbladder is  unremarkable.  Pancreas: Unremarkable. No pancreatic ductal dilatation or surrounding inflammatory changes.  Spleen: Normal in size without focal abnormality.  Adrenals/Urinary Tract: The adrenal glands are unremarkable. Subcentimeter right renal upper pole hypodense lesion is not characterized but likely represents a cyst. There is no hydronephrosis on either side. The visualized ureters and urinary bladder appear unremarkable.  Stomach/Bowel: There is no bowel obstruction or active inflammation. The appendix is normal.  Lymphatic: Retroperitoneal adenopathy,  increased in size compared to the prior CT. An aortocaval lymph node measures approximately 12 mm in short axis. Bilateral external iliac chain new filled-in Opti measure up to 20 mm on the right side.  Reproductive: Hysterectomy. No pelvic mass.  Other: None  Musculoskeletal: Degenerative changes of the spine. No acute osseous pathology.  Review of the MIP images confirms the above findings.  IMPRESSION: 1. No acute intrathoracic, abdominal, or pelvic pathology. No aortic dissection or aneurysm. 2. Interval increase in the size of the bilateral axillary, retroperitoneal, and bilateral external iliac chain lymph nodes in keeping with history of lymphoproliferative disorder.    Impression and Plan:   73 year old woman with:  1.  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  She is status post rituximab outlined above and currently on active surveillance.  Her laboratory data as well as CT scan obtained on 12/12/2018 were personally reviewed and discussed with the patient.  At this time, I see no evidence to suggest rapid progression of her disease that could account for the symptoms.  She does have mild increase in her white cell count as well as her lymphadenopathy.  It is very possible that her CLL is evolving but certainly not progressing rapidly that requires immediate treatment.  Risks and benefits of initiating treatment at this time versus continued observation and surveillance was reviewed and at this time we have opted to defer treatment with ibrutinib till later date pending her disease course.  Indication for treatment would be a rapid increase in her white cell count, symptomatic lymphadenopathy or marrow failure.  She is agreeable with this plan.   2.  Rheumatoid arthritis: She continues to follow with rheumatology currently on methotrexate.    3.  Follow-up: In 3 months for repeat evaluation.  25  minutes was spent with the patient  face-to-face today.  More than 50% of time was spent on reviewing laboratory data, imaging studies, treatment indication as well as answering questions regarding plan of care.   Zola Button, MD 8/14/20209:48 AM

## 2018-12-16 ENCOUNTER — Telehealth: Payer: Self-pay | Admitting: Oncology

## 2018-12-16 NOTE — Telephone Encounter (Signed)
No los per 8/14.

## 2018-12-30 DIAGNOSIS — Z961 Presence of intraocular lens: Secondary | ICD-10-CM | POA: Diagnosis not present

## 2018-12-30 DIAGNOSIS — H26493 Other secondary cataract, bilateral: Secondary | ICD-10-CM | POA: Diagnosis not present

## 2019-01-13 DIAGNOSIS — H938X3 Other specified disorders of ear, bilateral: Secondary | ICD-10-CM | POA: Diagnosis not present

## 2019-01-13 DIAGNOSIS — E039 Hypothyroidism, unspecified: Secondary | ICD-10-CM | POA: Diagnosis not present

## 2019-01-13 DIAGNOSIS — R030 Elevated blood-pressure reading, without diagnosis of hypertension: Secondary | ICD-10-CM | POA: Diagnosis not present

## 2019-01-23 ENCOUNTER — Emergency Department (HOSPITAL_COMMUNITY)
Admission: EM | Admit: 2019-01-23 | Discharge: 2019-01-24 | Disposition: A | Discharge status: Home / Self Care | Payer: PPO | Attending: Emergency Medicine | Admitting: Emergency Medicine

## 2019-01-23 DIAGNOSIS — C9110 Chronic lymphocytic leukemia of B-cell type not having achieved remission: Secondary | ICD-10-CM | POA: Diagnosis not present

## 2019-01-23 DIAGNOSIS — M545 Low back pain: Secondary | ICD-10-CM | POA: Diagnosis not present

## 2019-01-23 DIAGNOSIS — M5489 Other dorsalgia: Secondary | ICD-10-CM | POA: Diagnosis not present

## 2019-01-23 DIAGNOSIS — I10 Essential (primary) hypertension: Secondary | ICD-10-CM | POA: Diagnosis not present

## 2019-01-23 DIAGNOSIS — L659 Nonscarring hair loss, unspecified: Secondary | ICD-10-CM | POA: Diagnosis not present

## 2019-01-23 DIAGNOSIS — Z6828 Body mass index (BMI) 28.0-28.9, adult: Secondary | ICD-10-CM | POA: Diagnosis not present

## 2019-01-23 DIAGNOSIS — Z79899 Other long term (current) drug therapy: Secondary | ICD-10-CM | POA: Diagnosis not present

## 2019-01-23 DIAGNOSIS — R457 State of emotional shock and stress, unspecified: Secondary | ICD-10-CM | POA: Diagnosis not present

## 2019-01-23 DIAGNOSIS — M62830 Muscle spasm of back: Secondary | ICD-10-CM | POA: Diagnosis not present

## 2019-01-23 DIAGNOSIS — Z5321 Procedure and treatment not carried out due to patient leaving prior to being seen by health care provider: Secondary | ICD-10-CM | POA: Insufficient documentation

## 2019-01-23 DIAGNOSIS — M549 Dorsalgia, unspecified: Secondary | ICD-10-CM | POA: Diagnosis not present

## 2019-01-23 DIAGNOSIS — Z7189 Other specified counseling: Secondary | ICD-10-CM | POA: Diagnosis not present

## 2019-01-23 DIAGNOSIS — R5383 Other fatigue: Secondary | ICD-10-CM | POA: Diagnosis not present

## 2019-01-23 DIAGNOSIS — R110 Nausea: Secondary | ICD-10-CM | POA: Diagnosis not present

## 2019-01-23 DIAGNOSIS — M069 Rheumatoid arthritis, unspecified: Secondary | ICD-10-CM | POA: Diagnosis not present

## 2019-01-23 HISTORY — DX: Chronic lymphocytic leukemia of B-cell type not having achieved remission: C91.10

## 2019-01-23 HISTORY — DX: Rheumatoid arthritis, unspecified: M06.9

## 2019-01-23 NOTE — ED Triage Notes (Addendum)
Pt presents by [facility] for back pain that is similar to symptoms in August. Pt reports to EMS that she did not fill prescriptions that were given at last visit to ED. Pt reports taking Methotrexate injectable today. Pt reported that she was seen in August for taking Methotrexate and had similar reaction.

## 2019-01-24 ENCOUNTER — Emergency Department (HOSPITAL_BASED_OUTPATIENT_CLINIC_OR_DEPARTMENT_OTHER)
Admission: EM | Admit: 2019-01-24 | Discharge: 2019-01-24 | Disposition: A | Discharge status: Home / Self Care | Payer: PPO | Source: Home / Self Care | Attending: Emergency Medicine | Admitting: Emergency Medicine

## 2019-01-24 ENCOUNTER — Encounter (HOSPITAL_COMMUNITY): Payer: Self-pay | Admitting: Emergency Medicine

## 2019-01-24 ENCOUNTER — Emergency Department (HOSPITAL_COMMUNITY): Payer: PPO

## 2019-01-24 ENCOUNTER — Other Ambulatory Visit: Payer: Self-pay

## 2019-01-24 ENCOUNTER — Encounter (HOSPITAL_BASED_OUTPATIENT_CLINIC_OR_DEPARTMENT_OTHER): Payer: Self-pay | Admitting: Emergency Medicine

## 2019-01-24 DIAGNOSIS — C9110 Chronic lymphocytic leukemia of B-cell type not having achieved remission: Secondary | ICD-10-CM | POA: Insufficient documentation

## 2019-01-24 DIAGNOSIS — Z79899 Other long term (current) drug therapy: Secondary | ICD-10-CM | POA: Insufficient documentation

## 2019-01-24 DIAGNOSIS — M62830 Muscle spasm of back: Secondary | ICD-10-CM | POA: Diagnosis not present

## 2019-01-24 DIAGNOSIS — M545 Low back pain: Secondary | ICD-10-CM | POA: Diagnosis not present

## 2019-01-24 DIAGNOSIS — R112 Nausea with vomiting, unspecified: Secondary | ICD-10-CM | POA: Insufficient documentation

## 2019-01-24 LAB — URINALYSIS, ROUTINE W REFLEX MICROSCOPIC
Bilirubin Urine: NEGATIVE
Glucose, UA: NEGATIVE mg/dL
Ketones, ur: 15 mg/dL — AB
Leukocytes,Ua: NEGATIVE
Nitrite: NEGATIVE
Protein, ur: NEGATIVE mg/dL
Specific Gravity, Urine: 1.015 (ref 1.005–1.030)
pH: 7.5 (ref 5.0–8.0)

## 2019-01-24 LAB — URINALYSIS, MICROSCOPIC (REFLEX)

## 2019-01-24 MED ORDER — SODIUM CHLORIDE 0.9 % IV BOLUS (SEPSIS)
1000.0000 mL | Freq: Once | INTRAVENOUS | Status: AC
  Administered 2019-01-24: 1000 mL via INTRAVENOUS

## 2019-01-24 MED ORDER — LORAZEPAM 2 MG/ML IJ SOLN: 1.0000 mg | Freq: Once | INTRAMUSCULAR | Status: DC

## 2019-01-24 MED ORDER — LIDOCAINE 5 % EX PTCH: 2.0000 | MEDICATED_PATCH | CUTANEOUS | Status: DC

## 2019-01-24 MED ORDER — METHOCARBAMOL 1000 MG/10ML IJ SOLN
1000.0000 mg | Freq: Once | INTRAMUSCULAR | Status: DC
  Filled 2019-01-24: qty 10

## 2019-01-24 MED ORDER — ONDANSETRON HCL 4 MG/2ML IJ SOLN
4.0000 mg | Freq: Once | INTRAMUSCULAR | Status: AC
  Administered 2019-01-24: 4 mg via INTRAVENOUS
  Filled 2019-01-24: qty 2

## 2019-01-24 MED ORDER — KETOROLAC TROMETHAMINE 30 MG/ML IJ SOLN: 30.0000 mg | Freq: Once | INTRAMUSCULAR | Status: DC

## 2019-01-24 MED ORDER — ACETAMINOPHEN 500 MG PO TABS: 1000.0000 mg | ORAL_TABLET | Freq: Once | ORAL | Status: DC

## 2019-01-24 MED ORDER — LORAZEPAM 2 MG/ML IJ SOLN
1.0000 mg | Freq: Once | INTRAMUSCULAR | Status: AC
  Administered 2019-01-24: 1 mg via INTRAVENOUS
  Filled 2019-01-24: qty 1

## 2019-01-24 NOTE — ED Triage Notes (Signed)
Pt c/o back spasms, hematuria, and emesis  Pt states her symptoms started last night and have progressively gotten worse

## 2019-01-24 NOTE — ED Provider Notes (Signed)
[place] EMERGENCY DEPARTMENT Provider Note   CSN: [id number] Arrival date & time: 01/24/19  [id number]     History   Chief Complaint Chief Complaint  Patient presents with  . Hematuria  . Emesis    HPI Nicole Campbell is a 73 y.o. female.     The history is provided by the patient.  Back Pain Location:  Lumbar spine Quality: spasm. Pain severity:  Severe Pain is:  Same all the time Onset quality:  Sudden Timing:  Constant Progression:  Worsening Chronicity:  Recurrent Context comment:  MTX administration Relieved by:  Nothing Exacerbated by: movement. Associated symptoms: abdominal pain   Associated symptoms: no bladder incontinence, no bowel incontinence, no fever, no numbness and no weakness   Risk factors: hx of cancer   Patient with history of chronic lymphocytic leukemia, history of rheumatoid arthritis presents with back spasms, abdominal pain and vomiting. Patient reports that she undergoes methotrexate injections, and after this medication she began having back spasms that radiates into her abdomen with associated vomiting.  She also reports hematuria. She has been seen for this in the past.  She did try the Valium that she was prescribed previously without any relief. She went to [facility] had an x-ray but did not want to wait for the results due to pain Past Medical History:  Diagnosis Date  . CLL (chronic lymphocytic leukemia) ([place])   . Rheumatoid arthritis [facility])     Patient Active Problem List   Diagnosis Date Noted  . CLL (chronic lymphocytic leukemia) ([place]) 07/10/2018    Past Surgical History:  Procedure Laterality Date  . ABDOMINAL HYSTERECTOMY    . TONSILLECTOMY       OB History   No obstetric history on file.      Home Medications    Prior to Admission medications   Medication Sig Start Date End Date Taking? Authorizing Provider  diazepam (VALIUM) 5 MG tablet Take 0.5 tablets (2.5 mg total) by mouth every 8 (eight)  hours as needed for muscle spasms (may cause drowsiness). 12/05/18   Molpus, John, MD  folic acid (FOLVITE) 1 MG tablet Take 1 mg by mouth daily. 10/21/18   [provider]  HYDROcodone-acetaminophen (NORCO/VICODIN) 5-325 MG tablet Take 1 tablet by mouth every 4 (four) hours as needed. 12/12/18   Ward, Delice Bison, DO  ibuprofen (ADVIL) 200 MG tablet Take 200 mg by mouth 2 (two) times daily.    [provider]  levothyroxine (SYNTHROID, LEVOTHROID) 25 MCG tablet Take 25 mcg by mouth daily before breakfast.  04/30/17   [provider]  methotrexate 2.5 MG tablet Take 15 mg by mouth once a week. 11/11/18   [provider]  ondansetron (ZOFRAN ODT) 4 MG disintegrating tablet Take 1 tablet (4 mg total) by mouth every 6 (six) hours as needed for nausea or vomiting. Patient not taking: Reported on 12/13/2018 12/12/18   Ward, Delice Bison, DO    Family History History reviewed. No pertinent family history.  Social History Social History   Tobacco Use  . Smoking status: Unknown If Ever Smoked  . Smokeless tobacco: Never Used  Substance Use Topics  . Alcohol use: Never    Frequency: Never  . Drug use: Never     Allergies   Erythromycin   Review of Systems Review of Systems  Constitutional: Negative for fever.  Gastrointestinal: Positive for abdominal pain. Negative for bowel incontinence.  Genitourinary: Positive for hematuria. Negative for bladder incontinence.  Musculoskeletal: Positive  for back pain.  Neurological: Negative for weakness and numbness.  All other systems reviewed and are negative.    Physical Exam Updated Vital Signs BP (!) 171/89 (BP Location: Left Arm)   Pulse 96   Temp 97.6 F (36.4 C) (Oral)   Resp 20   Ht 1.689 m (5' 6.5")   Wt 81.6 kg   SpO2 100%   BMI 28.62 kg/m   Physical Exam CONSTITUTIONAL: Well developed, crying and anxious HEAD: Normocephalic/atraumatic EYES: EOMI ENMT: Mucous membranes moist NECK: supple no  meningeal signs SPINE/BACK:diffuse lumbar spinal/paraspinal tenderness, no crepitus CV: S1/S2 noted, no murmurs/rubs/gallops noted LUNGS: Lungs are clear to auscultation bilaterally, no apparent distress ABDOMEN: soft, nontender, no rebound or guarding GU:no cva tenderness NEURO: Awake/alert, equal motor 5/5 strength noted with the following: hip flexion/knee flexion/extension, foot dorsi/plantar flexion, great toe extension intact bilaterally,no sensory deficit in any dermatome.   EXTREMITIES: pulses normal, full ROM SKIN: warm, color normal PSYCH: anxious  ED Treatments / Results  Labs (all labs ordered are listed, but only abnormal results are displayed) Labs Reviewed  URINALYSIS, ROUTINE W REFLEX MICROSCOPIC    EKG None  Radiology Dg Lumbar Spine Complete  Result Date: 01/24/2019 CLINICAL DATA:  73 year old female with back pain. No known injury. EXAM: LUMBAR SPINE - COMPLETE 4+ VIEW COMPARISON:  CT of the chest abdomen pelvis dated 12/12/2018 FINDINGS: There is no acute fracture or subluxation of the lumbar spine. There is osteopenia with multilevel degenerative changes with endplate irregularity and disc space narrowing. Lower lumbar facet arthropathy noted. The visualized posterior elements appear intact. One the soft tissues are grossly unremarkable. Atherosclerotic calcification of the abdominal aorta noted. IMPRESSION: 1. No acute fracture or subluxation of the lumbar spine. 2. Osteopenia with multilevel degenerative changes. Electronically Signed   By: Anner Crete M.D.   On: 01/24/2019 02:00    Procedures Procedures   Medications Ordered in ED Medications  LORazepam (ATIVAN) injection 1 mg (has no administration in time range)  LORazepam (ATIVAN) injection 1 mg (1 mg Intravenous Given 01/24/19 0553)  ondansetron (ZOFRAN) injection 4 mg (4 mg Intravenous Given 01/24/19 0553)  sodium chloride 0.9 % bolus 1,000 mL (1,000 mLs Intravenous New Bag/Given 01/24/19 0552)      Initial Impression / Assessment and Plan / ED Course  I have reviewed the triage vital signs and the nursing notes.  Pertinent labs & imaging results that were available during my care of the patient were reviewed by me and considered in my medical decision making (see chart for details).        6:01 AM Patient with CLL as well as RA presenting with back spasm and pain.  The pain originates in her back and does go into her abdomen at times with associated vomiting.  She also mentions hematuria. She had 2 ER visits in August for similar presentation.  She feels it occurs after receiving MTX. She has had extensive work-up including CT chest/abdomen/pelvis last month during these episodes.  That was negative for acute process She is nursing her lumbar x-ray is negative.  Will check urinalysis due to hematuria. She has had good response to Ativan in the past, will give a dose and reassess 6:45 AM Pt appears more comfortable but is sleepy Will await urinalysis and reassess 6:51 AM Signed out to dr Langston Masker to f/u on urine and ensure pt can ambulate  Final Clinical Impressions(s) / ED Diagnoses   Final diagnoses:  Back spasm    ED Discharge Orders  None       Ripley Fraise, MD 01/24/19 [phone]

## 2019-01-24 NOTE — Discharge Instructions (Addendum)
You can take your valium at home as prescribed for your muscle spasms.  Be careful - this medicine will make you VERY drowsy.  Do not drive after taking valium.  You should try to take it only at night before bedtime, and only if you are having severe spasms or back pain.

## 2019-02-03 ENCOUNTER — Telehealth: Payer: Self-pay | Admitting: Oncology

## 2019-02-03 ENCOUNTER — Telehealth: Payer: Self-pay

## 2019-02-03 NOTE — Telephone Encounter (Signed)
Scheduled appt per 10/5 sch message - pt is aware of appt date and time

## 2019-02-03 NOTE — Telephone Encounter (Signed)
Called patient back and made aware that a scheduler will be calling her today with appointment times for tomorrow. Explained that Dr. Alen Blew will need to see her and obtain lab work before moving forward. She verbalized understanding and had no other questions or concerns.

## 2019-02-03 NOTE — Telephone Encounter (Signed)
-----   Message from Wyatt Portela, MD sent at 02/03/2019  9:37 AM EDT ----- Regarding: RE: appt? I need to see her and check labs first. I sent a message for 10/6 follow up at 8 am. Thanks.  ----- Message ----- From: Scot Dock, RN Sent: 02/03/2019   9:26 AM EDT To: Wyatt Portela, MD Subject: appt?                                          Patient called to follow up on request of rheumatology MD for patient to restart Rituxan here. She wants to make sure that you have been able to speak with the rheumatologist. Per her understanding she was to restart Rituxan last week or this week and has not received an appointment. Please advise. Thanks

## 2019-02-04 ENCOUNTER — Inpatient Hospital Stay: Payer: PPO | Attending: Oncology | Admitting: Oncology

## 2019-02-04 ENCOUNTER — Other Ambulatory Visit: Payer: Self-pay

## 2019-02-04 ENCOUNTER — Inpatient Hospital Stay: Payer: PPO

## 2019-02-04 ENCOUNTER — Telehealth: Payer: Self-pay

## 2019-02-04 VITALS — BP 147/74 | HR 81 | Temp 98.0°F | Resp 18 | Ht 66.0 in | Wt 177.8 lb

## 2019-02-04 DIAGNOSIS — Z23 Encounter for immunization: Secondary | ICD-10-CM | POA: Diagnosis not present

## 2019-02-04 DIAGNOSIS — C9110 Chronic lymphocytic leukemia of B-cell type not having achieved remission: Secondary | ICD-10-CM | POA: Diagnosis not present

## 2019-02-04 DIAGNOSIS — Z5112 Encounter for antineoplastic immunotherapy: Secondary | ICD-10-CM | POA: Insufficient documentation

## 2019-02-04 DIAGNOSIS — M069 Rheumatoid arthritis, unspecified: Secondary | ICD-10-CM | POA: Insufficient documentation

## 2019-02-04 LAB — CBC WITH DIFFERENTIAL (CANCER CENTER ONLY)
Abs Immature Granulocytes: 0.53 10*3/uL — ABNORMAL HIGH (ref 0.00–0.07)
Basophils Absolute: 0.4 10*3/uL — ABNORMAL HIGH (ref 0.0–0.1)
Basophils Relative: 1 %
Eosinophils Absolute: 0.4 10*3/uL (ref 0.0–0.5)
Eosinophils Relative: 1 %
HCT: 40.3 % (ref 36.0–46.0)
Hemoglobin: 12.4 g/dL (ref 12.0–15.0)
Immature Granulocytes: 1 %
Lymphocytes Relative: 88 %
Lymphs Abs: 56.6 10*3/uL — ABNORMAL HIGH (ref 0.7–4.0)
MCH: 30.5 pg (ref 26.0–34.0)
MCHC: 30.8 g/dL (ref 30.0–36.0)
MCV: 99 fL (ref 80.0–100.0)
Monocytes Absolute: 2.1 10*3/uL — ABNORMAL HIGH (ref 0.1–1.0)
Monocytes Relative: 3 %
Neutro Abs: 3.8 10*3/uL (ref 1.7–7.7)
Neutrophils Relative %: 6 %
Platelet Count: 202 10*3/uL (ref 150–400)
RBC: 4.07 MIL/uL (ref 3.87–5.11)
RDW: 17.7 % — ABNORMAL HIGH (ref 11.5–15.5)
WBC Count: 63.8 10*3/uL (ref 4.0–10.5)
nRBC: 0.1 % (ref 0.0–0.2)

## 2019-02-04 LAB — CMP (CANCER CENTER ONLY)
ALT: 20 U/L (ref 0–44)
AST: 17 U/L (ref 15–41)
Albumin: 3.8 g/dL (ref 3.5–5.0)
Alkaline Phosphatase: 68 U/L (ref 38–126)
Anion gap: 8 (ref 5–15)
BUN: 19 mg/dL (ref 8–23)
CO2: 27 mmol/L (ref 22–32)
Calcium: 9.4 mg/dL (ref 8.9–10.3)
Chloride: 105 mmol/L (ref 98–111)
Creatinine: 0.88 mg/dL (ref 0.44–1.00)
GFR, Est AFR Am: >60 mL/min (ref >60)
GFR, Estimated: >60 mL/min (ref >60)
Glucose, Bld: 93 mg/dL (ref 70–99)
Potassium: 4.1 mmol/L (ref 3.5–5.1)
Sodium: 140 mmol/L (ref 135–145)
Total Bilirubin: 0.6 mg/dL (ref 0.3–1.2)
Total Protein: 6.3 g/dL — ABNORMAL LOW (ref 6.5–8.1)

## 2019-02-04 NOTE — Addendum Note (Signed)
Addended by: Scot Dock on: 02/04/2019 01:51 PM   Modules accepted: Orders

## 2019-02-04 NOTE — Progress Notes (Signed)
Hematology and Oncology Follow Up Visit  Nicole Campbell [id number] 06-26-1945 73 y.o. 02/04/2019 8:24 AM Nicole Campbell, MDSmith, [name], MD   Principle Diagnosis: 73 year old woman with:  1.  Stage I CLL diagnosed in November 2019.  She presented with lymphocytosis and lymphadenopathy as well as p53 deletion and deletion of ATM.   2.  Rheumatoid arthritis.  She follows with rheumatology regarding this issue.  Prior therapy: She is status post rituximab weekly for 4 weeks completed on August 21, 2018.   Current therapy: Active surveillance.  Interim History: Nicole Campbell is here for a repeat evaluation.  Since the last visit, she was seen in the emergency department on 01/24/2019 after presenting with back spasm after her methotrexate dosing.  Methotrexate has been discontinued since that time and currently on prednisone 10 mg daily.  She reports her joint pain is improved currently.  He denies any fevers or chills or sweats.  She denies any painful adenopathy.   She denied any alteration mental status, neuropathy, confusion or dizziness.  Denies any headaches or lethargy.  Denies any night sweats, weight loss or changes in appetite.  Denied orthopnea, dyspnea on exertion or chest discomfort.  Denies shortness of breath, difficulty breathing hemoptysis or cough.  Denies any abdominal distention, nausea, early satiety or dyspepsia.  Denies any hematuria, frequency, dysuria or nocturia.  Denies any skin irritation, dryness or rash.  Denies any ecchymosis or petechiae.  Denies any lymphadenopathy or clotting.  Denies any heat or cold intolerance.  Denies any anxiety or depression.  Remaining review of system is negative.     .    Medications: Unchanged on review. Current Outpatient Medications  Medication Sig Dispense Refill  . diazepam (VALIUM) 5 MG tablet Take 0.5 tablets (2.5 mg total) by mouth every 8 (eight) hours as needed for muscle spasms (may cause drowsiness). 10 tablet 0  .  folic acid (FOLVITE) 1 MG tablet Take 1 mg by mouth daily.    Marland Kitchen HYDROcodone-acetaminophen (NORCO/VICODIN) 5-325 MG tablet Take 1 tablet by mouth every 4 (four) hours as needed. 15 tablet 0  . ibuprofen (ADVIL) 200 MG tablet Take 200 mg by mouth 2 (two) times daily.    Marland Kitchen levothyroxine (SYNTHROID, LEVOTHROID) 25 MCG tablet Take 25 mcg by mouth daily before breakfast.     . methotrexate 2.5 MG tablet Take 15 mg by mouth once a week.    . ondansetron (ZOFRAN ODT) 4 MG disintegrating tablet Take 1 tablet (4 mg total) by mouth every 6 (six) hours as needed for nausea or vomiting. (Patient not taking: Reported on 12/13/2018) 20 tablet 0   No current facility-administered medications for this visit.      Allergies:  Allergies  Allergen Reactions  . Erythromycin Palpitations    Past Medical History, Surgical history, Social history, and Family History without any changes on review.    Physical Exam:  Blood pressure (!) 147/74, pulse 81, temperature 98 F (36.7 C), temperature source Temporal, resp. rate 18, height _0  (1.676 m), weight 177 lb 12.8 oz (80.6 kg), SpO2 100 %.     ECOG:1    General appearance: Comfortable appearing without any discomfort Head: Normocephalic without any trauma Oropharynx: Mucous membranes are moist and pink without any thrush or ulcers. Eyes: Pupils are equal and round reactive to light. Lymph nodes: No cervical, supraclavicular, inguinal or axillary lymphadenopathy.   Heart:regular rate and rhythm.  S1 and S2 without leg edema. Lung: Clear without any rhonchi or wheezes.  No  dullness to percussion. Abdomin: Soft, nontender, nondistended with good bowel sounds.  No hepatosplenomegaly. Musculoskeletal: No joint deformity or effusion.  Full range of motion noted. Neurological: No deficits noted on motor, sensory and deep tendon reflex exam. Skin: No petechial rash or dryness.  Appeared moist.              Lab Results: Lab Results  Component  Value Date   WBC 27.7 (H) 12/13/2018   HGB 13.5 12/13/2018   HCT 42.9 12/13/2018   MCV 91.1 12/13/2018   PLT 198 12/13/2018     Chemistry      Component Value Date/Time   NA 139 12/13/2018 0935   K 3.9 12/13/2018 0935   CL 103 12/13/2018 0935   CO2 26 12/13/2018 0935   BUN 16 12/13/2018 0935   CREATININE 1.06 (H) 12/13/2018 0935      Component Value Date/Time   CALCIUM 9.5 12/13/2018 0935   ALKPHOS 76 12/13/2018 0935   AST 21 12/13/2018 0935   ALT 21 12/13/2018 0935   BILITOT 0.7 12/13/2018 0935       Impression and Plan:   73 year old woman with:  1.  CLL diagnosed in November 2019 after presenting with lymphocytosis and lymphadenopathy.   She had a modest response to rituximab with improvement in her leukocytosis and white cell count that was transient with rapid increase afterwards.  The natural course of this disease as well as future treatment options were reviewed.  These would include oral targeted agent such as ibrutinib, single agent rituximab, systemic chemotherapy in combination with rituximab among others.  Given her excellent response to rituximab treatment and the rapid rise in her white cell count, we have elected to proceed with rituximab at this time.  She will receive it weekly for 4 weeks and regimen repeated every 3 months for a year and attempt to keep her disease into remission.  Risks and benefits of rituximab specifically was reviewed.  Complications include nausea, vomiting as well as infusion related complications and rarely anaphylaxis.  She is agreeable to proceed at this time.   2.  Rheumatoid arthritis: She is currently on prednisone 10 mg daily.  Rituximab treatment of her CLL might help her rheumatoid arthritis as well.    3.  Follow-up: We will be in the immediate future for repeat rituximab infusion.  25  minutes was spent with the patient face-to-face today.  More than 50% of time was dedicated to updating her disease status, reviewing  laboratory data, imaging studies and answering questions regarding future plan of care.   Zola Button, MD 10/6/20208:24 AM

## 2019-02-04 NOTE — Telephone Encounter (Signed)
-----   Message from Wyatt Portela, MD sent at 02/04/2019  9:18 AM EDT ----- Regarding: RE: Critical WBC 63.8 Noted. Thanks ----- Message ----- From: Scot Dock, RN Sent: 02/04/2019   9:10 AM EDT To: Wyatt Portela, MD Subject: Critical WBC 63.8

## 2019-02-06 ENCOUNTER — Telehealth: Payer: Self-pay | Admitting: Oncology

## 2019-02-06 NOTE — Telephone Encounter (Signed)
Scheduled per sch msg. Called and spoke with patient. Confirmed appt  

## 2019-02-11 ENCOUNTER — Other Ambulatory Visit: Payer: Self-pay

## 2019-02-11 ENCOUNTER — Inpatient Hospital Stay: Payer: PPO

## 2019-02-11 VITALS — BP 135/84 | HR 98 | Temp 98.6°F | Resp 18

## 2019-02-11 DIAGNOSIS — Z5112 Encounter for antineoplastic immunotherapy: Secondary | ICD-10-CM | POA: Diagnosis not present

## 2019-02-11 DIAGNOSIS — C9110 Chronic lymphocytic leukemia of B-cell type not having achieved remission: Secondary | ICD-10-CM

## 2019-02-11 DIAGNOSIS — Z23 Encounter for immunization: Secondary | ICD-10-CM

## 2019-02-11 MED ORDER — DIPHENHYDRAMINE HCL 25 MG PO CAPS
50.0000 mg | ORAL_CAPSULE | Freq: Once | ORAL | Status: AC
  Administered 2019-02-11: 50 mg via ORAL

## 2019-02-11 MED ORDER — FAMOTIDINE IN NACL 20-0.9 MG/50ML-% IV SOLN
20.0000 mg | Freq: Once | INTRAVENOUS | Status: AC
  Administered 2019-02-11: 20 mg via INTRAVENOUS

## 2019-02-11 MED ORDER — METHYLPREDNISOLONE SODIUM SUCC 125 MG IJ SOLR
60.0000 mg | Freq: Once | INTRAMUSCULAR | Status: AC
  Administered 2019-02-11: 60 mg via INTRAVENOUS

## 2019-02-11 MED ORDER — FAMOTIDINE IN NACL 20-0.9 MG/50ML-% IV SOLN
INTRAVENOUS | Status: AC
  Filled 2019-02-11: qty 50

## 2019-02-11 MED ORDER — ACETAMINOPHEN 325 MG PO TABS
650.0000 mg | ORAL_TABLET | Freq: Once | ORAL | Status: AC
  Administered 2019-02-11: 650 mg via ORAL

## 2019-02-11 MED ORDER — INFLUENZA VAC A&B SA ADJ QUAD 0.5 ML IM PRSY
0.5000 mL | PREFILLED_SYRINGE | Freq: Once | INTRAMUSCULAR | Status: AC
  Administered 2019-02-11: 0.5 mL via INTRAMUSCULAR

## 2019-02-11 MED ORDER — SODIUM CHLORIDE 0.9 % IV SOLN
375.0000 mg/m2 | Freq: Once | INTRAVENOUS | Status: AC
  Administered 2019-02-11: 700 mg via INTRAVENOUS
  Filled 2019-02-11: qty 50

## 2019-02-11 MED ORDER — SODIUM CHLORIDE 0.9 % IV SOLN
Freq: Once | INTRAVENOUS | Status: AC
  Administered 2019-02-11: 09:00:00 via INTRAVENOUS
  Filled 2019-02-11: qty 250

## 2019-02-11 MED ORDER — ACETAMINOPHEN 325 MG PO TABS
ORAL_TABLET | ORAL | Status: AC
  Filled 2019-02-11: qty 2

## 2019-02-11 MED ORDER — INFLUENZA VAC A&B SA ADJ QUAD 0.5 ML IM PRSY
PREFILLED_SYRINGE | INTRAMUSCULAR | Status: AC
  Filled 2019-02-11: qty 0.5

## 2019-02-11 MED ORDER — METHYLPREDNISOLONE SODIUM SUCC 125 MG IJ SOLR
INTRAMUSCULAR | Status: AC
  Filled 2019-02-11: qty 2

## 2019-02-11 MED ORDER — DIPHENHYDRAMINE HCL 25 MG PO CAPS
ORAL_CAPSULE | ORAL | Status: AC
  Filled 2019-02-11: qty 2

## 2019-02-11 NOTE — Patient Instructions (Signed)
[facility] Discharge Instructions for Patients Receiving Chemotherapy  Today you received the following chemotherapy agents:  Rituxan.  To help prevent nausea and vomiting after your treatment, we encourage you to take your nausea medication as directed.   If you develop nausea and vomiting that is not controlled by your nausea medication, call the clinic.   BELOW ARE SYMPTOMS THAT SHOULD BE REPORTED IMMEDIATELY:  *FEVER GREATER THAN 100.5 F  *CHILLS WITH OR WITHOUT FEVER  NAUSEA AND VOMITING THAT IS NOT CONTROLLED WITH YOUR NAUSEA MEDICATION  *UNUSUAL SHORTNESS OF BREATH  *UNUSUAL BRUISING OR BLEEDING  TENDERNESS IN MOUTH AND THROAT WITH OR WITHOUT PRESENCE OF ULCERS  *URINARY PROBLEMS  *BOWEL PROBLEMS  UNUSUAL RASH Items with * indicate a potential emergency and should be followed up as soon as possible.  Feel free to call the clinic should you have any questions or concerns. The clinic phone number is [phone].  Please show the CHEMO ALERT CARD at check-in to the Emergency Department and triage nurse.   

## 2019-02-18 ENCOUNTER — Telehealth: Payer: Self-pay

## 2019-02-18 ENCOUNTER — Inpatient Hospital Stay: Payer: PPO

## 2019-02-18 ENCOUNTER — Encounter: Payer: Self-pay | Admitting: Oncology

## 2019-02-18 ENCOUNTER — Other Ambulatory Visit: Payer: Self-pay

## 2019-02-18 VITALS — BP 134/68 | HR 90 | Temp 98.2°F | Resp 16

## 2019-02-18 DIAGNOSIS — C9110 Chronic lymphocytic leukemia of B-cell type not having achieved remission: Secondary | ICD-10-CM

## 2019-02-18 DIAGNOSIS — Z5112 Encounter for antineoplastic immunotherapy: Secondary | ICD-10-CM | POA: Diagnosis not present

## 2019-02-18 LAB — CMP (CANCER CENTER ONLY)
ALT: 19 U/L (ref 0–44)
AST: 14 U/L — ABNORMAL LOW (ref 15–41)
Albumin: 3.8 g/dL (ref 3.5–5.0)
Alkaline Phosphatase: 60 U/L (ref 38–126)
Anion gap: 10 (ref 5–15)
BUN: 18 mg/dL (ref 8–23)
CO2: 23 mmol/L (ref 22–32)
Calcium: 9.1 mg/dL (ref 8.9–10.3)
Chloride: 108 mmol/L (ref 98–111)
Creatinine: 0.91 mg/dL (ref 0.44–1.00)
GFR, Est AFR Am: >60 mL/min (ref >60)
GFR, Estimated: >60 mL/min (ref >60)
Glucose, Bld: 128 mg/dL — ABNORMAL HIGH (ref 70–99)
Potassium: 3.8 mmol/L (ref 3.5–5.1)
Sodium: 141 mmol/L (ref 135–145)
Total Bilirubin: 0.5 mg/dL (ref 0.3–1.2)
Total Protein: 6.1 g/dL — ABNORMAL LOW (ref 6.5–8.1)

## 2019-02-18 LAB — CBC WITH DIFFERENTIAL (CANCER CENTER ONLY)
Abs Immature Granulocytes: 0.18 10*3/uL — ABNORMAL HIGH (ref 0.00–0.07)
Basophils Absolute: 0.2 10*3/uL — ABNORMAL HIGH (ref 0.0–0.1)
Basophils Relative: 1 %
Eosinophils Absolute: 0.2 10*3/uL (ref 0.0–0.5)
Eosinophils Relative: 1 %
HCT: 39.4 % (ref 36.0–46.0)
Hemoglobin: 12.8 g/dL (ref 12.0–15.0)
Immature Granulocytes: 1 %
Lymphocytes Relative: 84 %
Lymphs Abs: 21.3 10*3/uL — ABNORMAL HIGH (ref 0.7–4.0)
MCH: 31.6 pg (ref 26.0–34.0)
MCHC: 32.5 g/dL (ref 30.0–36.0)
MCV: 97.3 fL (ref 80.0–100.0)
Monocytes Absolute: 0.8 10*3/uL (ref 0.1–1.0)
Monocytes Relative: 3 %
Neutro Abs: 2.6 10*3/uL (ref 1.7–7.7)
Neutrophils Relative %: 10 %
Platelet Count: 180 10*3/uL (ref 150–400)
RBC: 4.05 MIL/uL (ref 3.87–5.11)
RDW: 16.7 % — ABNORMAL HIGH (ref 11.5–15.5)
WBC Count: 25.3 10*3/uL — ABNORMAL HIGH (ref 4.0–10.5)
nRBC: 0 % (ref 0.0–0.2)

## 2019-02-18 MED ORDER — METHYLPREDNISOLONE SODIUM SUCC 125 MG IJ SOLR
INTRAMUSCULAR | Status: AC
  Filled 2019-02-18: qty 2

## 2019-02-18 MED ORDER — ACETAMINOPHEN 325 MG PO TABS
650.0000 mg | ORAL_TABLET | Freq: Once | ORAL | Status: AC
  Administered 2019-02-18: 650 mg via ORAL

## 2019-02-18 MED ORDER — SODIUM CHLORIDE 0.9 % IV SOLN
375.0000 mg/m2 | Freq: Once | INTRAVENOUS | Status: AC
  Administered 2019-02-18: 700 mg via INTRAVENOUS
  Filled 2019-02-18: qty 20

## 2019-02-18 MED ORDER — METHYLPREDNISOLONE SODIUM SUCC 125 MG IJ SOLR
60.0000 mg | Freq: Once | INTRAMUSCULAR | Status: AC
  Administered 2019-02-18: 60 mg via INTRAVENOUS

## 2019-02-18 MED ORDER — SODIUM CHLORIDE 0.9 % IV SOLN
Freq: Once | INTRAVENOUS | Status: AC
  Administered 2019-02-18: 12:00:00 via INTRAVENOUS
  Filled 2019-02-18: qty 250

## 2019-02-18 MED ORDER — DIPHENHYDRAMINE HCL 25 MG PO CAPS
50.0000 mg | ORAL_CAPSULE | Freq: Once | ORAL | Status: AC
  Administered 2019-02-18: 50 mg via ORAL

## 2019-02-18 MED ORDER — DIPHENHYDRAMINE HCL 25 MG PO CAPS
ORAL_CAPSULE | ORAL | Status: AC
  Filled 2019-02-18: qty 2

## 2019-02-18 MED ORDER — FAMOTIDINE IN NACL 20-0.9 MG/50ML-% IV SOLN
20.0000 mg | Freq: Once | INTRAVENOUS | Status: AC
  Administered 2019-02-18: 12:00:00 20 mg via INTRAVENOUS

## 2019-02-18 MED ORDER — FAMOTIDINE IN NACL 20-0.9 MG/50ML-% IV SOLN
INTRAVENOUS | Status: AC
  Filled 2019-02-18: qty 50

## 2019-02-18 MED ORDER — ACETAMINOPHEN 325 MG PO TABS
ORAL_TABLET | ORAL | Status: AC
  Filled 2019-02-18: qty 2

## 2019-02-18 NOTE — Patient Instructions (Signed)
[facility] Discharge Instructions for Patients Receiving Chemotherapy  Today you received the following chemotherapy agents: Rituximab   To help prevent nausea and vomiting after your treatment, we encourage you to take your nausea medication  as prescribed.    If you develop nausea and vomiting that is not controlled by your nausea medication, call the clinic.   BELOW ARE SYMPTOMS THAT SHOULD BE REPORTED IMMEDIATELY:  *FEVER GREATER THAN 100.5 F  *CHILLS WITH OR WITHOUT FEVER  NAUSEA AND VOMITING THAT IS NOT CONTROLLED WITH YOUR NAUSEA MEDICATION  *UNUSUAL SHORTNESS OF BREATH  *UNUSUAL BRUISING OR BLEEDING  TENDERNESS IN MOUTH AND THROAT WITH OR WITHOUT PRESENCE OF ULCERS  *URINARY PROBLEMS  *BOWEL PROBLEMS  UNUSUAL RASH Items with * indicate a potential emergency and should be followed up as soon as possible.  Feel free to call the clinic should you have any questions or concerns. The clinic phone number is [phone].  Please show the CHEMO ALERT CARD at check-in to the Emergency Department and triage nurse.   

## 2019-02-18 NOTE — Telephone Encounter (Signed)
Patient called and left message inquiring about financial assistance.  Per note from [place] on 07/23/18, patient has qualified for financial assistance previously.  Message sent to [facility] to follow-up with patient. Called and left voicemail to let patient know to expect a call from [facility].

## 2019-02-18 NOTE — Progress Notes (Signed)
Pt is approved w/ the [employer] for $8,400 for Rituxan effective7/22/20 to 02/17/20.

## 2019-02-25 ENCOUNTER — Other Ambulatory Visit: Payer: Self-pay

## 2019-02-25 ENCOUNTER — Inpatient Hospital Stay: Payer: PPO

## 2019-02-25 VITALS — BP 124/61 | HR 88 | Temp 98.3°F | Resp 18

## 2019-02-25 DIAGNOSIS — C9110 Chronic lymphocytic leukemia of B-cell type not having achieved remission: Secondary | ICD-10-CM

## 2019-02-25 DIAGNOSIS — Z5112 Encounter for antineoplastic immunotherapy: Secondary | ICD-10-CM | POA: Diagnosis not present

## 2019-02-25 LAB — CMP (CANCER CENTER ONLY)
ALT: 20 U/L (ref 0–44)
AST: 15 U/L (ref 15–41)
Albumin: 3.6 g/dL (ref 3.5–5.0)
Alkaline Phosphatase: 60 U/L (ref 38–126)
Anion gap: 9 (ref 5–15)
BUN: 12 mg/dL (ref 8–23)
CO2: 24 mmol/L (ref 22–32)
Calcium: 8.9 mg/dL (ref 8.9–10.3)
Chloride: 108 mmol/L (ref 98–111)
Creatinine: 0.8 mg/dL (ref 0.44–1.00)
GFR, Est AFR Am: >60 mL/min (ref >60)
GFR, Estimated: >60 mL/min (ref >60)
Glucose, Bld: 89 mg/dL (ref 70–99)
Potassium: 3.7 mmol/L (ref 3.5–5.1)
Sodium: 141 mmol/L (ref 135–145)
Total Bilirubin: 0.6 mg/dL (ref 0.3–1.2)
Total Protein: 5.8 g/dL — ABNORMAL LOW (ref 6.5–8.1)

## 2019-02-25 LAB — CBC WITH DIFFERENTIAL (CANCER CENTER ONLY)
Abs Immature Granulocytes: 0.09 10*3/uL — ABNORMAL HIGH (ref 0.00–0.07)
Basophils Absolute: 0.1 10*3/uL (ref 0.0–0.1)
Basophils Relative: 1 %
Eosinophils Absolute: 0.2 10*3/uL (ref 0.0–0.5)
Eosinophils Relative: 1 %
HCT: 39.5 % (ref 36.0–46.0)
Hemoglobin: 12.7 g/dL (ref 12.0–15.0)
Immature Granulocytes: 1 %
Lymphocytes Relative: 79 %
Lymphs Abs: 15.3 10*3/uL — ABNORMAL HIGH (ref 0.7–4.0)
MCH: 31.4 pg (ref 26.0–34.0)
MCHC: 32.2 g/dL (ref 30.0–36.0)
MCV: 97.5 fL (ref 80.0–100.0)
Monocytes Absolute: 0.8 10*3/uL (ref 0.1–1.0)
Monocytes Relative: 4 %
Neutro Abs: 2.6 10*3/uL (ref 1.7–7.7)
Neutrophils Relative %: 14 %
Platelet Count: 208 10*3/uL (ref 150–400)
RBC: 4.05 MIL/uL (ref 3.87–5.11)
RDW: 15.7 % — ABNORMAL HIGH (ref 11.5–15.5)
WBC Count: 19.1 10*3/uL — ABNORMAL HIGH (ref 4.0–10.5)
nRBC: 0 % (ref 0.0–0.2)

## 2019-02-25 MED ORDER — METHYLPREDNISOLONE SODIUM SUCC 125 MG IJ SOLR
60.0000 mg | Freq: Once | INTRAMUSCULAR | Status: AC
  Administered 2019-02-25: 60 mg via INTRAVENOUS

## 2019-02-25 MED ORDER — FAMOTIDINE IN NACL 20-0.9 MG/50ML-% IV SOLN
INTRAVENOUS | Status: AC
  Filled 2019-02-25: qty 50

## 2019-02-25 MED ORDER — SODIUM CHLORIDE 0.9 % IV SOLN
Freq: Once | INTRAVENOUS | Status: AC
  Administered 2019-02-25: 09:00:00 via INTRAVENOUS
  Filled 2019-02-25: qty 250

## 2019-02-25 MED ORDER — METHYLPREDNISOLONE SODIUM SUCC 125 MG IJ SOLR
INTRAMUSCULAR | Status: AC
  Filled 2019-02-25: qty 2

## 2019-02-25 MED ORDER — DIPHENHYDRAMINE HCL 25 MG PO CAPS
50.0000 mg | ORAL_CAPSULE | Freq: Once | ORAL | Status: AC
  Administered 2019-02-25: 50 mg via ORAL

## 2019-02-25 MED ORDER — SODIUM CHLORIDE 0.9 % IV SOLN
375.0000 mg/m2 | Freq: Once | INTRAVENOUS | Status: AC
  Administered 2019-02-25: 700 mg via INTRAVENOUS
  Filled 2019-02-25: qty 20

## 2019-02-25 MED ORDER — DIPHENHYDRAMINE HCL 25 MG PO CAPS
ORAL_CAPSULE | ORAL | Status: AC
  Filled 2019-02-25: qty 2

## 2019-02-25 MED ORDER — ACETAMINOPHEN 325 MG PO TABS
ORAL_TABLET | ORAL | Status: AC
  Filled 2019-02-25: qty 2

## 2019-02-25 MED ORDER — ACETAMINOPHEN 325 MG PO TABS
650.0000 mg | ORAL_TABLET | Freq: Once | ORAL | Status: AC
  Administered 2019-02-25: 650 mg via ORAL

## 2019-02-25 MED ORDER — FAMOTIDINE IN NACL 20-0.9 MG/50ML-% IV SOLN
20.0000 mg | Freq: Once | INTRAVENOUS | Status: AC
  Administered 2019-02-25: 20 mg via INTRAVENOUS

## 2019-02-25 NOTE — Patient Instructions (Signed)
[facility] Discharge Instructions for Patients Receiving Chemotherapy  Today you received the following chemotherapy agents: Rituximab   To help prevent nausea and vomiting after your treatment, we encourage you to take your nausea medication  as prescribed.    If you develop nausea and vomiting that is not controlled by your nausea medication, call the clinic.   BELOW ARE SYMPTOMS THAT SHOULD BE REPORTED IMMEDIATELY:  *FEVER GREATER THAN 100.5 F  *CHILLS WITH OR WITHOUT FEVER  NAUSEA AND VOMITING THAT IS NOT CONTROLLED WITH YOUR NAUSEA MEDICATION  *UNUSUAL SHORTNESS OF BREATH  *UNUSUAL BRUISING OR BLEEDING  TENDERNESS IN MOUTH AND THROAT WITH OR WITHOUT PRESENCE OF ULCERS  *URINARY PROBLEMS  *BOWEL PROBLEMS  UNUSUAL RASH Items with * indicate a potential emergency and should be followed up as soon as possible.  Feel free to call the clinic should you have any questions or concerns. The clinic phone number is [phone].  Please show the CHEMO ALERT CARD at check-in to the Emergency Department and triage nurse.   

## 2019-03-04 ENCOUNTER — Inpatient Hospital Stay: Payer: PPO

## 2019-03-04 ENCOUNTER — Other Ambulatory Visit: Payer: Self-pay

## 2019-03-04 ENCOUNTER — Inpatient Hospital Stay: Payer: PPO | Attending: Oncology

## 2019-03-04 VITALS — BP 121/65 | HR 74 | Temp 98.3°F | Resp 18

## 2019-03-04 DIAGNOSIS — C9110 Chronic lymphocytic leukemia of B-cell type not having achieved remission: Secondary | ICD-10-CM | POA: Insufficient documentation

## 2019-03-04 DIAGNOSIS — Z5112 Encounter for antineoplastic immunotherapy: Secondary | ICD-10-CM | POA: Insufficient documentation

## 2019-03-04 LAB — CBC WITH DIFFERENTIAL (CANCER CENTER ONLY)
Abs Immature Granulocytes: 0.1 10*3/uL — ABNORMAL HIGH (ref 0.00–0.07)
Basophils Absolute: 0.1 10*3/uL (ref 0.0–0.1)
Basophils Relative: 1 %
Eosinophils Absolute: 0.2 10*3/uL (ref 0.0–0.5)
Eosinophils Relative: 1 %
HCT: 39.9 % (ref 36.0–46.0)
Hemoglobin: 12.9 g/dL (ref 12.0–15.0)
Immature Granulocytes: 1 %
Lymphocytes Relative: 76 %
Lymphs Abs: 10.1 10*3/uL — ABNORMAL HIGH (ref 0.7–4.0)
MCH: 31.9 pg (ref 26.0–34.0)
MCHC: 32.3 g/dL (ref 30.0–36.0)
MCV: 98.5 fL (ref 80.0–100.0)
Monocytes Absolute: 0.7 10*3/uL (ref 0.1–1.0)
Monocytes Relative: 5 %
Neutro Abs: 2.2 10*3/uL (ref 1.7–7.7)
Neutrophils Relative %: 16 %
Platelet Count: 224 10*3/uL (ref 150–400)
RBC: 4.05 MIL/uL (ref 3.87–5.11)
RDW: 14.7 % (ref 11.5–15.5)
WBC Count: 13.3 10*3/uL — ABNORMAL HIGH (ref 4.0–10.5)
nRBC: 0 % (ref 0.0–0.2)

## 2019-03-04 LAB — CMP (CANCER CENTER ONLY)
ALT: 16 U/L (ref 0–44)
AST: 15 U/L (ref 15–41)
Albumin: 3.7 g/dL (ref 3.5–5.0)
Alkaline Phosphatase: 61 U/L (ref 38–126)
Anion gap: 10 (ref 5–15)
BUN: 17 mg/dL (ref 8–23)
CO2: 24 mmol/L (ref 22–32)
Calcium: 9 mg/dL (ref 8.9–10.3)
Chloride: 107 mmol/L (ref 98–111)
Creatinine: 0.82 mg/dL (ref 0.44–1.00)
GFR, Est AFR Am: >60 mL/min (ref >60)
GFR, Estimated: >60 mL/min (ref >60)
Glucose, Bld: 97 mg/dL (ref 70–99)
Potassium: 3.7 mmol/L (ref 3.5–5.1)
Sodium: 141 mmol/L (ref 135–145)
Total Bilirubin: 0.6 mg/dL (ref 0.3–1.2)
Total Protein: 6 g/dL — ABNORMAL LOW (ref 6.5–8.1)

## 2019-03-04 MED ORDER — DIPHENHYDRAMINE HCL 25 MG PO CAPS
50.0000 mg | ORAL_CAPSULE | Freq: Once | ORAL | Status: AC
  Administered 2019-03-04: 50 mg via ORAL

## 2019-03-04 MED ORDER — FAMOTIDINE IN NACL 20-0.9 MG/50ML-% IV SOLN
20.0000 mg | Freq: Once | INTRAVENOUS | Status: AC
  Administered 2019-03-04: 20 mg via INTRAVENOUS

## 2019-03-04 MED ORDER — ACETAMINOPHEN 325 MG PO TABS
ORAL_TABLET | ORAL | Status: AC
  Filled 2019-03-04: qty 2

## 2019-03-04 MED ORDER — METHYLPREDNISOLONE SODIUM SUCC 125 MG IJ SOLR
60.0000 mg | Freq: Once | INTRAMUSCULAR | Status: AC
  Administered 2019-03-04: 60 mg via INTRAVENOUS

## 2019-03-04 MED ORDER — SODIUM CHLORIDE 0.9 % IV SOLN
Freq: Once | INTRAVENOUS | Status: AC
  Administered 2019-03-04: 09:00:00 via INTRAVENOUS
  Filled 2019-03-04: qty 250

## 2019-03-04 MED ORDER — METHYLPREDNISOLONE SODIUM SUCC 125 MG IJ SOLR
INTRAMUSCULAR | Status: AC
  Filled 2019-03-04: qty 2

## 2019-03-04 MED ORDER — ACETAMINOPHEN 325 MG PO TABS
650.0000 mg | ORAL_TABLET | Freq: Once | ORAL | Status: AC
  Administered 2019-03-04: 650 mg via ORAL

## 2019-03-04 MED ORDER — FAMOTIDINE IN NACL 20-0.9 MG/50ML-% IV SOLN
INTRAVENOUS | Status: AC
  Filled 2019-03-04: qty 50

## 2019-03-04 MED ORDER — DIPHENHYDRAMINE HCL 25 MG PO CAPS
ORAL_CAPSULE | ORAL | Status: AC
  Filled 2019-03-04: qty 2

## 2019-03-04 MED ORDER — SODIUM CHLORIDE 0.9 % IV SOLN
375.0000 mg/m2 | Freq: Once | INTRAVENOUS | Status: AC
  Administered 2019-03-04: 700 mg via INTRAVENOUS
  Filled 2019-03-04: qty 20

## 2019-03-04 NOTE — Patient Instructions (Signed)
[facility] Discharge Instructions for Patients Receiving Chemotherapy  Today you received the following chemotherapy agents: Rituximab   To help prevent nausea and vomiting after your treatment, we encourage you to take your nausea medication  as prescribed.    If you develop nausea and vomiting that is not controlled by your nausea medication, call the clinic.   BELOW ARE SYMPTOMS THAT SHOULD BE REPORTED IMMEDIATELY:  *FEVER GREATER THAN 100.5 F  *CHILLS WITH OR WITHOUT FEVER  NAUSEA AND VOMITING THAT IS NOT CONTROLLED WITH YOUR NAUSEA MEDICATION  *UNUSUAL SHORTNESS OF BREATH  *UNUSUAL BRUISING OR BLEEDING  TENDERNESS IN MOUTH AND THROAT WITH OR WITHOUT PRESENCE OF ULCERS  *URINARY PROBLEMS  *BOWEL PROBLEMS  UNUSUAL RASH Items with * indicate a potential emergency and should be followed up as soon as possible.  Feel free to call the clinic should you have any questions or concerns. The clinic phone number is [phone].  Please show the CHEMO ALERT CARD at check-in to the Emergency Department and triage nurse.   

## 2019-03-12 ENCOUNTER — Inpatient Hospital Stay: Payer: PPO

## 2019-03-12 ENCOUNTER — Inpatient Hospital Stay (HOSPITAL_BASED_OUTPATIENT_CLINIC_OR_DEPARTMENT_OTHER): Payer: PPO | Admitting: Oncology

## 2019-03-12 ENCOUNTER — Other Ambulatory Visit: Payer: Self-pay

## 2019-03-12 VITALS — BP 132/64 | HR 87 | Temp 98.7°F | Resp 18 | Ht 66.0 in | Wt 180.0 lb

## 2019-03-12 DIAGNOSIS — C9110 Chronic lymphocytic leukemia of B-cell type not having achieved remission: Secondary | ICD-10-CM

## 2019-03-12 DIAGNOSIS — Z5112 Encounter for antineoplastic immunotherapy: Secondary | ICD-10-CM | POA: Diagnosis not present

## 2019-03-12 LAB — CBC WITH DIFFERENTIAL (CANCER CENTER ONLY)
Abs Immature Granulocytes: 0.12 10*3/uL — ABNORMAL HIGH (ref 0.00–0.07)
Basophils Absolute: 0.1 10*3/uL (ref 0.0–0.1)
Basophils Relative: 1 %
Eosinophils Absolute: 0.1 10*3/uL (ref 0.0–0.5)
Eosinophils Relative: 1 %
HCT: 40.8 % (ref 36.0–46.0)
Hemoglobin: 13.1 g/dL (ref 12.0–15.0)
Immature Granulocytes: 1 %
Lymphocytes Relative: 46 %
Lymphs Abs: 6 10*3/uL — ABNORMAL HIGH (ref 0.7–4.0)
MCH: 31.5 pg (ref 26.0–34.0)
MCHC: 32.1 g/dL (ref 30.0–36.0)
MCV: 98.1 fL (ref 80.0–100.0)
Monocytes Absolute: 0.5 10*3/uL (ref 0.1–1.0)
Monocytes Relative: 4 %
Neutro Abs: 6.3 10*3/uL (ref 1.7–7.7)
Neutrophils Relative %: 47 %
Platelet Count: 207 10*3/uL (ref 150–400)
RBC: 4.16 MIL/uL (ref 3.87–5.11)
RDW: 13.8 % (ref 11.5–15.5)
WBC Count: 13.1 10*3/uL — ABNORMAL HIGH (ref 4.0–10.5)
nRBC: 0 % (ref 0.0–0.2)

## 2019-03-12 LAB — CMP (CANCER CENTER ONLY)
ALT: 14 U/L (ref 0–44)
AST: 13 U/L — ABNORMAL LOW (ref 15–41)
Albumin: 3.7 g/dL (ref 3.5–5.0)
Alkaline Phosphatase: 69 U/L (ref 38–126)
Anion gap: 7 (ref 5–15)
BUN: 20 mg/dL (ref 8–23)
CO2: 27 mmol/L (ref 22–32)
Calcium: 8.9 mg/dL (ref 8.9–10.3)
Chloride: 107 mmol/L (ref 98–111)
Creatinine: 0.97 mg/dL (ref 0.44–1.00)
GFR, Est AFR Am: >60 mL/min (ref >60)
GFR, Estimated: 58 mL/min — ABNORMAL LOW (ref >60)
Glucose, Bld: 129 mg/dL — ABNORMAL HIGH (ref 70–99)
Potassium: 4.6 mmol/L (ref 3.5–5.1)
Sodium: 141 mmol/L (ref 135–145)
Total Bilirubin: 0.4 mg/dL (ref 0.3–1.2)
Total Protein: 6.1 g/dL — ABNORMAL LOW (ref 6.5–8.1)

## 2019-03-12 NOTE — Progress Notes (Signed)
Hematology and Oncology Follow Up Visit  Nicole Campbell [id number] [birth year] 73 y.o. 03/12/2019 11:55 AM [name], MDSmith, [name], MD   Principle Diagnosis: 73 year old woman with:  1.  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  She also has p53 deletion and deletion of ATM.   2.  Rheumatoid arthritis.   Prior therapy: She is status post rituximab weekly for 4 weeks completed on August 21, 2018.   Current therapy:   Rituximab 375 mg per metered square weekly started on October 13 of 2020 and completed on 03/04/2019.  Prednisone 10 mg daily for rheumatoid arthritis.  Interim History: Ms. Laprade is here for a follow-up.  Since the last visit, she completed 4 weeks of rituximab therapy without any major complications.  She continues to report improvement in her overall joint pain and stiffness as she is currently on prednisone.  She denies any infusion related complications with rituximab.  She did not have any lymphadenopathy or constitutional symptoms.  She remains active and continues to attend activities of daily living.  Patient denied headaches, blurry vision, syncope or seizures.  Denies any fevers, chills or sweats.  Denied chest pain, palpitation, orthopnea or leg edema.  Denied cough, wheezing or hemoptysis.  Denied nausea, vomiting or abdominal pain.  Denies any constipation or diarrhea.  Denies any frequency urgency or hesitancy.  Denies any arthralgias or myalgias.  Denies any skin rashes or lesions.  Denies any bleeding or clotting tendency.  Denies any easy bruising.  Denies any hair or nail changes.  Denies any anxiety or depression.  Remaining review of system is negative.          .    Medications: Without any changes on review. Current Outpatient Medications  Medication Sig Dispense Refill  . diazepam (VALIUM) 5 MG tablet Take 0.5 tablets (2.5 mg total) by mouth every 8 (eight)  hours as needed for muscle spasms (may cause drowsiness). 10 tablet 0  . folic acid (FOLVITE) 1 MG tablet Take 1 mg by mouth daily.    Marland Kitchen HYDROcodone-acetaminophen (NORCO/VICODIN) 5-325 MG tablet Take 1 tablet by mouth every 4 (four) hours as needed. (Patient not taking: Reported on 02/04/2019) 15 tablet 0  . ibuprofen (ADVIL) 200 MG tablet Take 200 mg by mouth 2 (two) times daily.    Marland Kitchen levothyroxine (SYNTHROID, LEVOTHROID) 25 MCG tablet Take 25 mcg by mouth daily before breakfast.     . methotrexate 2.5 MG tablet Take 15 mg by mouth once a week.    . predniSONE (DELTASONE) 10 MG tablet Take 1 tablet by mouth daily.     No current facility-administered medications for this visit.      Allergies:  Allergies  Allergen Reactions  . Erythromycin Palpitations    Past Medical History, Surgical history, Social history, and Family History unchanged on review.    Physical Exam:    Blood pressure 132/64, pulse 87, temperature 98.7 F (37.1 C), temperature source Temporal, resp. rate 18, height '5\' 6"'$  (1.676 m), weight 180 lb (81.6 kg), SpO2 99 %.    ECOG:1      General appearance: Alert, awake without any distress. Head: Atraumatic without abnormalities Oropharynx: Without any thrush or ulcers. Eyes: No scleral icterus. Lymph nodes: No lymphadenopathy noted in the cervical, supraclavicular, or axillary nodes Heart:regular rate and rhythm, without any murmurs or gallops.   Lung: Clear to auscultation without any rhonchi, wheezes or dullness to percussion. Abdomin: Soft, nontender without any  shifting dullness or ascites. Musculoskeletal: No clubbing or cyanosis. Neurological: No motor or sensory deficits. Skin: No rashes or lesions. Psychiatric: Mood and affect appeared normal.              Lab Results: Lab Results  Component Value Date   WBC 13.3 (H) 03/04/2019   HGB 12.9 03/04/2019   HCT 39.9 03/04/2019   MCV 98.5 03/04/2019   PLT 224 03/04/2019     Chemistry       Component Value Date/Time   NA 141 03/04/2019 0822   K 3.7 03/04/2019 0822   CL 107 03/04/2019 0822   CO2 24 03/04/2019 0822   BUN 17 03/04/2019 0822   CREATININE 0.82 03/04/2019 0822      Component Value Date/Time   CALCIUM 9.0 03/04/2019 0822   ALKPHOS 61 03/04/2019 0822   AST 15 03/04/2019 0822   ALT 16 03/04/2019 0822   BILITOT 0.6 03/04/2019 0822       Impression and Plan:   73 year old woman with:  1.  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     She has received rituximab on 2 separate occasions at this time with improvement in her white cell count currently at 13,000.  Risks and benefits of continuing rituximab as a maintenance administration was reviewed today.  Alternative options including ibrutinib as well as additional chemotherapy was reviewed.  Given the benefit that she is experiencing with rituximab for both CLL and rheumatoid arthritis I recommended continuing this as a maintenance purposes.  This will be tentatively done in February 2020.   2.  Rheumatoid arthritis: She is currently on prednisone 10 mg daily.  Rituximab treatment of her CLL might help her rheumatoid arthritis as well.  This will be repeated in 4 months as outlined above.  Symptomatic  3.  IV access: Risks and benefits of a Port-A-Cath insertion was reviewed today.  Potential complications include bleeding, thrombosis and infection.  She will consider that prior to her next rituximab infusion.  4.  Follow-up: She will return in 3 months for repeat evaluation and rituximab retreatment.  25  minutes was spent with the patient face-to-face today.  More than 50% of time was spent on updating her disease status, reviewing laboratory data as well as future plan of care and treatment options.   Zola Button, MD 11/11/202011:55 AM

## 2019-03-13 ENCOUNTER — Telehealth: Payer: Self-pay | Admitting: Oncology

## 2019-03-13 DIAGNOSIS — Z6828 Body mass index (BMI) 28.0-28.9, adult: Secondary | ICD-10-CM | POA: Diagnosis not present

## 2019-03-13 DIAGNOSIS — Z79899 Other long term (current) drug therapy: Secondary | ICD-10-CM | POA: Diagnosis not present

## 2019-03-13 DIAGNOSIS — R5383 Other fatigue: Secondary | ICD-10-CM | POA: Diagnosis not present

## 2019-03-13 DIAGNOSIS — M069 Rheumatoid arthritis, unspecified: Secondary | ICD-10-CM | POA: Diagnosis not present

## 2019-03-13 DIAGNOSIS — E663 Overweight: Secondary | ICD-10-CM | POA: Diagnosis not present

## 2019-03-13 DIAGNOSIS — C9110 Chronic lymphocytic leukemia of B-cell type not having achieved remission: Secondary | ICD-10-CM | POA: Diagnosis not present

## 2019-03-13 DIAGNOSIS — Z7189 Other specified counseling: Secondary | ICD-10-CM | POA: Diagnosis not present

## 2019-03-13 DIAGNOSIS — L659 Nonscarring hair loss, unspecified: Secondary | ICD-10-CM | POA: Diagnosis not present

## 2019-03-13 NOTE — Telephone Encounter (Signed)
Scheduled appt per 11/11 los.  Spoke with pt and she is aware of the appt date and time.

## 2019-06-10 DIAGNOSIS — R000 Tachycardia, unspecified: Secondary | ICD-10-CM | POA: Diagnosis not present

## 2019-06-10 DIAGNOSIS — E039 Hypothyroidism, unspecified: Secondary | ICD-10-CM | POA: Diagnosis not present

## 2019-06-10 DIAGNOSIS — R609 Edema, unspecified: Secondary | ICD-10-CM | POA: Diagnosis not present

## 2019-06-13 ENCOUNTER — Ambulatory Visit: Payer: PPO | Attending: Internal Medicine

## 2019-06-13 DIAGNOSIS — Z23 Encounter for immunization: Secondary | ICD-10-CM | POA: Insufficient documentation

## 2019-06-13 NOTE — Progress Notes (Signed)
   Covid-19 Vaccination Clinic  Name:  Nicole Campbell    MRN: [id number] DOB: 25-Jul-1945  06/13/2019  [name] was observed post Covid-19 immunization for 15 minutes without incidence. She was provided with Vaccine Information Sheet and instruction to access the V-Safe system.   [name] was instructed to call 911 with any severe reactions post vaccine: Marland Kitchen Difficulty breathing  . Swelling of your face and throat  . A fast heartbeat  . A bad rash all over your body  . Dizziness and weakness    Immunizations Administered    Name Date Dose VIS Date Route   Pfizer COVID-19 Vaccine 06/13/2019 12:50 PM 0.3 mL 04/11/2019 Intramuscular   Manufacturer: [place]   Lot: [id number]   [place]: [id number]

## 2019-06-18 ENCOUNTER — Inpatient Hospital Stay (HOSPITAL_BASED_OUTPATIENT_CLINIC_OR_DEPARTMENT_OTHER): Payer: PPO | Admitting: Oncology

## 2019-06-18 ENCOUNTER — Other Ambulatory Visit: Payer: Self-pay

## 2019-06-18 ENCOUNTER — Telehealth: Payer: Self-pay | Admitting: Oncology

## 2019-06-18 ENCOUNTER — Inpatient Hospital Stay: Payer: PPO | Attending: Oncology

## 2019-06-18 ENCOUNTER — Inpatient Hospital Stay: Payer: PPO

## 2019-06-18 VITALS — BP 127/67 | HR 90 | Temp 98.2°F | Resp 18

## 2019-06-18 VITALS — BP 156/77 | HR 103 | Temp 98.9°F | Resp 18 | Ht 66.0 in | Wt 188.4 lb

## 2019-06-18 DIAGNOSIS — C9110 Chronic lymphocytic leukemia of B-cell type not having achieved remission: Secondary | ICD-10-CM

## 2019-06-18 DIAGNOSIS — Z5112 Encounter for antineoplastic immunotherapy: Secondary | ICD-10-CM | POA: Insufficient documentation

## 2019-06-18 LAB — CBC WITH DIFFERENTIAL (CANCER CENTER ONLY)
Abs Immature Granulocytes: 0.5 10*3/uL — ABNORMAL HIGH (ref 0.00–0.07)
Basophils Absolute: 0.1 10*3/uL (ref 0.0–0.1)
Basophils Relative: 0 %
Eosinophils Absolute: 0.6 10*3/uL — ABNORMAL HIGH (ref 0.0–0.5)
Eosinophils Relative: 2 %
HCT: 41.8 % (ref 36.0–46.0)
Hemoglobin: 13.7 g/dL (ref 12.0–15.0)
Immature Granulocytes: 1 %
Lymphocytes Relative: 82 %
Lymphs Abs: 32.8 10*3/uL — ABNORMAL HIGH (ref 0.7–4.0)
MCH: 29.2 pg (ref 26.0–34.0)
MCHC: 32.8 g/dL (ref 30.0–36.0)
MCV: 89.1 fL (ref 80.0–100.0)
Monocytes Absolute: 1.8 10*3/uL — ABNORMAL HIGH (ref 0.1–1.0)
Monocytes Relative: 5 %
Neutro Abs: 4.1 10*3/uL (ref 1.7–7.7)
Neutrophils Relative %: 10 %
Platelet Count: 159 10*3/uL (ref 150–400)
RBC: 4.69 MIL/uL (ref 3.87–5.11)
RDW: 13.5 % (ref 11.5–15.5)
WBC Count: 39.9 10*3/uL — ABNORMAL HIGH (ref 4.0–10.5)
nRBC: 0.1 % (ref 0.0–0.2)

## 2019-06-18 LAB — CMP (CANCER CENTER ONLY)
ALT: 17 U/L (ref 0–44)
AST: 25 U/L (ref 15–41)
Albumin: 3.4 g/dL — ABNORMAL LOW (ref 3.5–5.0)
Alkaline Phosphatase: 74 U/L (ref 38–126)
Anion gap: 9 (ref 5–15)
BUN: 19 mg/dL (ref 8–23)
CO2: 24 mmol/L (ref 22–32)
Calcium: 9.1 mg/dL (ref 8.9–10.3)
Chloride: 108 mmol/L (ref 98–111)
Creatinine: 0.91 mg/dL (ref 0.44–1.00)
GFR, Est AFR Am: >60 mL/min (ref >60)
GFR, Estimated: >60 mL/min (ref >60)
Glucose, Bld: 101 mg/dL — ABNORMAL HIGH (ref 70–99)
Potassium: 4.1 mmol/L (ref 3.5–5.1)
Sodium: 141 mmol/L (ref 135–145)
Total Bilirubin: 0.5 mg/dL (ref 0.3–1.2)
Total Protein: 5.9 g/dL — ABNORMAL LOW (ref 6.5–8.1)

## 2019-06-18 MED ORDER — DIPHENHYDRAMINE HCL 25 MG PO CAPS
ORAL_CAPSULE | ORAL | Status: AC
  Filled 2019-06-18: qty 2

## 2019-06-18 MED ORDER — METHYLPREDNISOLONE SODIUM SUCC 125 MG IJ SOLR
INTRAMUSCULAR | Status: AC
  Filled 2019-06-18: qty 2

## 2019-06-18 MED ORDER — SODIUM CHLORIDE 0.9 % IV SOLN
375.0000 mg/m2 | Freq: Once | INTRAVENOUS | Status: AC
  Administered 2019-06-18: 700 mg via INTRAVENOUS
  Filled 2019-06-18: qty 50

## 2019-06-18 MED ORDER — METHYLPREDNISOLONE SODIUM SUCC 125 MG IJ SOLR
60.0000 mg | Freq: Once | INTRAMUSCULAR | Status: AC
  Administered 2019-06-18: 60 mg via INTRAVENOUS

## 2019-06-18 MED ORDER — FAMOTIDINE IN NACL 20-0.9 MG/50ML-% IV SOLN
20.0000 mg | Freq: Once | INTRAVENOUS | Status: AC
  Administered 2019-06-18: 20 mg via INTRAVENOUS

## 2019-06-18 MED ORDER — ACETAMINOPHEN 325 MG PO TABS
ORAL_TABLET | ORAL | Status: AC
  Filled 2019-06-18: qty 2

## 2019-06-18 MED ORDER — FAMOTIDINE IN NACL 20-0.9 MG/50ML-% IV SOLN
INTRAVENOUS | Status: AC
  Filled 2019-06-18: qty 50

## 2019-06-18 MED ORDER — ACETAMINOPHEN 325 MG PO TABS
650.0000 mg | ORAL_TABLET | Freq: Once | ORAL | Status: AC
  Administered 2019-06-18: 650 mg via ORAL

## 2019-06-18 MED ORDER — SODIUM CHLORIDE 0.9 % IV SOLN
Freq: Once | INTRAVENOUS | Status: AC
  Filled 2019-06-18: qty 250

## 2019-06-18 MED ORDER — DIPHENHYDRAMINE HCL 25 MG PO CAPS
50.0000 mg | ORAL_CAPSULE | Freq: Once | ORAL | Status: AC
  Administered 2019-06-18: 50 mg via ORAL

## 2019-06-18 NOTE — Patient Instructions (Signed)
[facility] Discharge Instructions for Patients Receiving Chemotherapy  Today you received the following chemotherapy agents:  Rituxan.  To help prevent nausea and vomiting after your treatment, we encourage you to take your nausea medication as directed.   If you develop nausea and vomiting that is not controlled by your nausea medication, call the clinic.   BELOW ARE SYMPTOMS THAT SHOULD BE REPORTED IMMEDIATELY:  *FEVER GREATER THAN 100.5 F  *CHILLS WITH OR WITHOUT FEVER  NAUSEA AND VOMITING THAT IS NOT CONTROLLED WITH YOUR NAUSEA MEDICATION  *UNUSUAL SHORTNESS OF BREATH  *UNUSUAL BRUISING OR BLEEDING  TENDERNESS IN MOUTH AND THROAT WITH OR WITHOUT PRESENCE OF ULCERS  *URINARY PROBLEMS  *BOWEL PROBLEMS  UNUSUAL RASH Items with * indicate a potential emergency and should be followed up as soon as possible.  Feel free to call the clinic should you have any questions or concerns. The clinic phone number is [phone].  Please show the CHEMO ALERT CARD at check-in to the Emergency Department and triage nurse.   

## 2019-06-18 NOTE — Telephone Encounter (Signed)
Scheduled appt per 2/17 los.

## 2019-06-18 NOTE — Progress Notes (Signed)
Hematology and Oncology Follow Up Visit  Nicole Campbell [id number] [birth year] 74 y.o. 06/18/2019 8:34 AM Nicole Campbell, MDSmith, Nicole Hope, MD   Principle Diagnosis: 74 year old woman with:  1.  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  He also was found to have p53 deletion and deletion of ATM on her prognostic work-up.  2.  Rheumatoid arthritis.   Prior therapy: She is status post rituximab weekly for 4 weeks completed on August 21, 2018.   Current therapy:   Rituximab 375 mg per metered square weekly started on October 13 of 2020 and completed on 03/04/2019.  This will be repeated every 3 months.    Interim History: Ms. Nicole Campbell returns today for repeat evaluation.  Since the last visit, she reports few complaints which are improving at this time.  She has reported lower extremity edema and palpitation.  She is being evaluated by cardiology for SVT in the near future.  She is off prednisone at this time and has been off it for the last 2 weeks without any exacerbation of her rheumatoid arthritis.  She denies any nausea, vomiting or abdominal pain.  She is eating well at this time.          .    Medications: Without any changes on review. Current Outpatient Medications  Medication Sig Dispense Refill  . folic acid (FOLVITE) 1 MG tablet Take 1 mg by mouth daily.    Marland Kitchen ibuprofen (ADVIL) 200 MG tablet Take 200 mg by mouth 2 (two) times daily.    Marland Kitchen levothyroxine (SYNTHROID, LEVOTHROID) 25 MCG tablet Take 25 mcg by mouth daily before breakfast.     . predniSONE (DELTASONE) 10 MG tablet Take 1 tablet by mouth daily.     No current facility-administered medications for this visit.     Allergies:  Allergies  Allergen Reactions  . Erythromycin Palpitations        Physical Exam:    Blood pressure (!) 156/77, pulse (!) 103, temperature 98.9 F (37.2 C), temperature source Temporal, resp. rate 18, height '5\' 6"'  (1.676 m),  weight 188 lb 6.4 oz (85.5 kg), SpO2 96 %.    ECOG:1    General appearance: Comfortable appearing without any discomfort Head: Normocephalic without any trauma Oropharynx: Mucous membranes are moist and pink without any thrush or ulcers. Eyes: Pupils are equal and round reactive to light. Lymph nodes: No cervical, supraclavicular, inguinal or axillary lymphadenopathy.   Heart:regular rate and rhythm.  S1 and S2 ankle edema noted bilaterally. Lung: Clear without any rhonchi or wheezes.  No dullness to percussion. Abdomin: Soft, nontender, nondistended with good bowel sounds.  No hepatosplenomegaly. Musculoskeletal: No joint deformity or effusion.  Full range of motion noted. Neurological: No deficits noted on motor, sensory and deep tendon reflex exam. Skin: No petechial rash or dryness.  Appeared moist.                Lab Results: Lab Results  Component Value Date   WBC 13.1 (H) 03/12/2019   HGB 13.1 03/12/2019   HCT 40.8 03/12/2019   MCV 98.1 03/12/2019   PLT 207 03/12/2019     Chemistry      Component Value Date/Time   NA 141 03/12/2019 1147   K 4.6 03/12/2019 1147   CL 107 03/12/2019 1147   CO2 27 03/12/2019 1147   BUN 20 03/12/2019 1147   CREATININE 0.97 03/12/2019 1147      Component Value Date/Time   CALCIUM 8.9 03/12/2019  1147   ALKPHOS 69 03/12/2019 1147   AST 13 (L) 03/12/2019 1147   ALT 14 03/12/2019 1147   BILITOT 0.4 03/12/2019 1147       Impression and Plan:   74 year old woman with:  1.  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  She was found to have lymphocytosis and adenopathy. 19.     She is currently receiving rituximab and infusion intermittently for the purpose of maintaining her disease status under control and treating her rheumatoid arthritis.  The natural course of this disease and alternative treatment options including ibrutinib as well as systemic chemotherapy were reviewed at this time.  Her white cell count is elevated between  rituximab treatment but overall disease is controlled and asymptomatic.  I recommended continuing rituximab treatment intermittently with 4-week infusion every 3 months for maintenance purposes.   2.  Rheumatoid arthritis: Rituximab have helped her disease status as well and have required prednisone in addition.  She was intolerant to methotrexate.  She is off prednisone with disease under control for the time being.  3.  IV access: Peripheral veins currently in use.  Port-A-Cath insertion was reiterated and the option was given to her at this time.  She declined this option for the time being.  4.  Follow-up: She will continue to follow weekly and repeat infusion in 3 months.  30  minutes was dedicated to this visit.  The time was spent on reviewing her disease status, treatment options and answering questions regarding future plan of care.   Nicole Button, MD 2/17/20218:34 AM

## 2019-06-19 ENCOUNTER — Telehealth: Payer: Self-pay

## 2019-06-19 NOTE — Telephone Encounter (Signed)
Called and informed patient of Dr. Hazeline Junker response below. Instructed patient to call 911 and seek medical attention if she starts to have troubles breathing or swallowing. Patient verbalized understanding. Patient instructed to called office if she had any additional questions or concerns.

## 2019-06-19 NOTE — Telephone Encounter (Signed)
-----   Message from Wyatt Portela, MD sent at 06/19/2019 10:37 AM EST ----- Regarding: RE: Patient concern I do not think this is a serious reaction to her infusion.  I recommend just monitoring her symptoms and report any changes.  No medications needed at this time.  Thanks ----- Message ----- From: Teodoro Spray, RN Sent: 06/19/2019  10:29 AM EST To: Wyatt Portela, MD Subject: Patient concern                                Patient called office complaining of numbness in bottom lip and upper chin. Patient denies troubles swallowing or breathing, denies swelling of tongue or discoloration in mouth. Patient states she did notice blotchy red spots on face, but does have this happen from time to time. Patient had dose of rituxan on 06/18/19, and states she has not had this happen after any other rituxan dose. Patient has not taken anything yet and it waiting for guidance on what to do.   Symptom management clinic is closed today.  Please advise.

## 2019-06-25 ENCOUNTER — Other Ambulatory Visit: Payer: Self-pay

## 2019-06-25 ENCOUNTER — Inpatient Hospital Stay: Payer: PPO

## 2019-06-25 VITALS — BP 128/63 | HR 82 | Temp 98.7°F | Resp 17 | Ht 66.0 in | Wt 185.4 lb

## 2019-06-25 DIAGNOSIS — C9110 Chronic lymphocytic leukemia of B-cell type not having achieved remission: Secondary | ICD-10-CM

## 2019-06-25 DIAGNOSIS — Z5112 Encounter for antineoplastic immunotherapy: Secondary | ICD-10-CM | POA: Diagnosis not present

## 2019-06-25 LAB — CMP (CANCER CENTER ONLY)
ALT: 15 U/L (ref 0–44)
AST: 23 U/L (ref 15–41)
Albumin: 3.5 g/dL (ref 3.5–5.0)
Alkaline Phosphatase: 74 U/L (ref 38–126)
Anion gap: 9 (ref 5–15)
BUN: 20 mg/dL (ref 8–23)
CO2: 24 mmol/L (ref 22–32)
Calcium: 9 mg/dL (ref 8.9–10.3)
Chloride: 109 mmol/L (ref 98–111)
Creatinine: 0.83 mg/dL (ref 0.44–1.00)
GFR, Est AFR Am: >60 mL/min (ref >60)
GFR, Estimated: >60 mL/min (ref >60)
Glucose, Bld: 98 mg/dL (ref 70–99)
Potassium: 4.1 mmol/L (ref 3.5–5.1)
Sodium: 142 mmol/L (ref 135–145)
Total Bilirubin: 0.4 mg/dL (ref 0.3–1.2)
Total Protein: 6 g/dL — ABNORMAL LOW (ref 6.5–8.1)

## 2019-06-25 LAB — CBC WITH DIFFERENTIAL (CANCER CENTER ONLY)
Abs Immature Granulocytes: 0.37 10*3/uL — ABNORMAL HIGH (ref 0.00–0.07)
Basophils Absolute: 0.2 10*3/uL — ABNORMAL HIGH (ref 0.0–0.1)
Basophils Relative: 1 %
Eosinophils Absolute: 1.1 10*3/uL — ABNORMAL HIGH (ref 0.0–0.5)
Eosinophils Relative: 4 %
HCT: 42.6 % (ref 36.0–46.0)
Hemoglobin: 13.8 g/dL (ref 12.0–15.0)
Immature Granulocytes: 1 %
Lymphocytes Relative: 81 %
Lymphs Abs: 22 10*3/uL — ABNORMAL HIGH (ref 0.7–4.0)
MCH: 28.8 pg (ref 26.0–34.0)
MCHC: 32.4 g/dL (ref 30.0–36.0)
MCV: 88.8 fL (ref 80.0–100.0)
Monocytes Absolute: 0.8 10*3/uL (ref 0.1–1.0)
Monocytes Relative: 3 %
Neutro Abs: 2.6 10*3/uL (ref 1.7–7.7)
Neutrophils Relative %: 10 %
Platelet Count: 193 10*3/uL (ref 150–400)
RBC: 4.8 MIL/uL (ref 3.87–5.11)
RDW: 14.1 % (ref 11.5–15.5)
WBC Count: 27.2 10*3/uL — ABNORMAL HIGH (ref 4.0–10.5)
nRBC: 0.1 % (ref 0.0–0.2)

## 2019-06-25 MED ORDER — FAMOTIDINE IN NACL 20-0.9 MG/50ML-% IV SOLN
20.0000 mg | Freq: Once | INTRAVENOUS | Status: AC
  Administered 2019-06-25: 20 mg via INTRAVENOUS

## 2019-06-25 MED ORDER — SODIUM CHLORIDE 0.9 % IV SOLN
Freq: Once | INTRAVENOUS | Status: AC
  Filled 2019-06-25: qty 250

## 2019-06-25 MED ORDER — ACETAMINOPHEN 325 MG PO TABS
650.0000 mg | ORAL_TABLET | Freq: Once | ORAL | Status: AC
  Administered 2019-06-25: 650 mg via ORAL

## 2019-06-25 MED ORDER — METHYLPREDNISOLONE SODIUM SUCC 125 MG IJ SOLR
60.0000 mg | Freq: Once | INTRAMUSCULAR | Status: AC
  Administered 2019-06-25: 60 mg via INTRAVENOUS

## 2019-06-25 MED ORDER — DIPHENHYDRAMINE HCL 25 MG PO CAPS
50.0000 mg | ORAL_CAPSULE | Freq: Once | ORAL | Status: AC
  Administered 2019-06-25: 50 mg via ORAL

## 2019-06-25 MED ORDER — SODIUM CHLORIDE 0.9 % IV SOLN
375.0000 mg/m2 | Freq: Once | INTRAVENOUS | Status: AC
  Administered 2019-06-25: 700 mg via INTRAVENOUS
  Filled 2019-06-25: qty 50

## 2019-06-25 NOTE — Patient Instructions (Signed)
[facility] Discharge Instructions for Patients Receiving Chemotherapy  Today you received the following chemotherapy agents: Rituxan   To help prevent nausea and vomiting after your treatment, we encourage you to take your nausea medication as directed.    If you develop nausea and vomiting that is not controlled by your nausea medication, call the clinic.   BELOW ARE SYMPTOMS THAT SHOULD BE REPORTED IMMEDIATELY:  *FEVER GREATER THAN 100.5 F  *CHILLS WITH OR WITHOUT FEVER  NAUSEA AND VOMITING THAT IS NOT CONTROLLED WITH YOUR NAUSEA MEDICATION  *UNUSUAL SHORTNESS OF BREATH  *UNUSUAL BRUISING OR BLEEDING  TENDERNESS IN MOUTH AND THROAT WITH OR WITHOUT PRESENCE OF ULCERS  *URINARY PROBLEMS  *BOWEL PROBLEMS  UNUSUAL RASH Items with * indicate a potential emergency and should be followed up as soon as possible.  Feel free to call the clinic you have any questions or concerns. The clinic phone number is [phone].  Please show the CHEMO ALERT CARD at check-in to the Emergency Department and triage nurse.   

## 2019-07-02 ENCOUNTER — Inpatient Hospital Stay: Payer: PPO | Attending: Oncology

## 2019-07-02 ENCOUNTER — Inpatient Hospital Stay: Payer: PPO

## 2019-07-02 ENCOUNTER — Other Ambulatory Visit: Payer: Self-pay

## 2019-07-02 ENCOUNTER — Ambulatory Visit (HOSPITAL_BASED_OUTPATIENT_CLINIC_OR_DEPARTMENT_OTHER): Payer: PPO | Admitting: Medical

## 2019-07-02 VITALS — BP 138/64 | HR 90 | Temp 98.5°F | Resp 18 | Wt 185.2 lb

## 2019-07-02 DIAGNOSIS — Z5112 Encounter for antineoplastic immunotherapy: Secondary | ICD-10-CM | POA: Diagnosis not present

## 2019-07-02 DIAGNOSIS — C9110 Chronic lymphocytic leukemia of B-cell type not having achieved remission: Secondary | ICD-10-CM | POA: Diagnosis not present

## 2019-07-02 DIAGNOSIS — R002 Palpitations: Secondary | ICD-10-CM

## 2019-07-02 LAB — CMP (CANCER CENTER ONLY)
ALT: 15 U/L (ref 0–44)
AST: 23 U/L (ref 15–41)
Albumin: 3.6 g/dL (ref 3.5–5.0)
Alkaline Phosphatase: 74 U/L (ref 38–126)
Anion gap: 7 (ref 5–15)
BUN: 15 mg/dL (ref 8–23)
CO2: 25 mmol/L (ref 22–32)
Calcium: 9 mg/dL (ref 8.9–10.3)
Chloride: 108 mmol/L (ref 98–111)
Creatinine: 0.81 mg/dL (ref 0.44–1.00)
GFR, Est AFR Am: >60 mL/min (ref >60)
GFR, Estimated: >60 mL/min (ref >60)
Glucose, Bld: 103 mg/dL — ABNORMAL HIGH (ref 70–99)
Potassium: 4 mmol/L (ref 3.5–5.1)
Sodium: 140 mmol/L (ref 135–145)
Total Bilirubin: 0.5 mg/dL (ref 0.3–1.2)
Total Protein: 6 g/dL — ABNORMAL LOW (ref 6.5–8.1)

## 2019-07-02 LAB — CBC WITH DIFFERENTIAL (CANCER CENTER ONLY)
Abs Immature Granulocytes: 0.18 10*3/uL — ABNORMAL HIGH (ref 0.00–0.07)
Basophils Absolute: 0.1 10*3/uL (ref 0.0–0.1)
Basophils Relative: 1 %
Eosinophils Absolute: 1.3 10*3/uL — ABNORMAL HIGH (ref 0.0–0.5)
Eosinophils Relative: 7 %
HCT: 42.9 % (ref 36.0–46.0)
Hemoglobin: 13.9 g/dL (ref 12.0–15.0)
Immature Granulocytes: 1 %
Lymphocytes Relative: 73 %
Lymphs Abs: 15.3 10*3/uL — ABNORMAL HIGH (ref 0.7–4.0)
MCH: 29.4 pg (ref 26.0–34.0)
MCHC: 32.4 g/dL (ref 30.0–36.0)
MCV: 90.9 fL (ref 80.0–100.0)
Monocytes Absolute: 0.8 10*3/uL (ref 0.1–1.0)
Monocytes Relative: 4 %
Neutro Abs: 2.9 10*3/uL (ref 1.7–7.7)
Neutrophils Relative %: 14 %
Platelet Count: 194 10*3/uL (ref 150–400)
RBC: 4.72 MIL/uL (ref 3.87–5.11)
RDW: 14.7 % (ref 11.5–15.5)
WBC Count: 20.6 10*3/uL — ABNORMAL HIGH (ref 4.0–10.5)
nRBC: 0.1 % (ref 0.0–0.2)

## 2019-07-02 MED ORDER — SODIUM CHLORIDE 0.9 % IV SOLN
375.0000 mg/m2 | Freq: Once | INTRAVENOUS | Status: AC
  Administered 2019-07-02: 700 mg via INTRAVENOUS
  Filled 2019-07-02: qty 50

## 2019-07-02 MED ORDER — FAMOTIDINE IN NACL 20-0.9 MG/50ML-% IV SOLN
INTRAVENOUS | Status: AC
  Filled 2019-07-02: qty 50

## 2019-07-02 MED ORDER — METHYLPREDNISOLONE SODIUM SUCC 125 MG IJ SOLR
60.0000 mg | Freq: Once | INTRAMUSCULAR | Status: AC
  Administered 2019-07-02: 10:00:00 60 mg via INTRAVENOUS

## 2019-07-02 MED ORDER — DIPHENHYDRAMINE HCL 25 MG PO CAPS
ORAL_CAPSULE | ORAL | Status: AC
  Filled 2019-07-02: qty 2

## 2019-07-02 MED ORDER — ACETAMINOPHEN 325 MG PO TABS
ORAL_TABLET | ORAL | Status: AC
  Filled 2019-07-02: qty 2

## 2019-07-02 MED ORDER — SODIUM CHLORIDE 0.9 % IV SOLN
Freq: Once | INTRAVENOUS | Status: AC
  Filled 2019-07-02: qty 250

## 2019-07-02 MED ORDER — METHYLPREDNISOLONE SODIUM SUCC 125 MG IJ SOLR
INTRAMUSCULAR | Status: AC
  Filled 2019-07-02: qty 2

## 2019-07-02 MED ORDER — DIPHENHYDRAMINE HCL 25 MG PO CAPS
50.0000 mg | ORAL_CAPSULE | Freq: Once | ORAL | Status: AC
  Administered 2019-07-02: 50 mg via ORAL

## 2019-07-02 MED ORDER — ACETAMINOPHEN 325 MG PO TABS
650.0000 mg | ORAL_TABLET | Freq: Once | ORAL | Status: AC
  Administered 2019-07-02: 650 mg via ORAL

## 2019-07-02 MED ORDER — FAMOTIDINE IN NACL 20-0.9 MG/50ML-% IV SOLN
20.0000 mg | Freq: Once | INTRAVENOUS | Status: AC
  Administered 2019-07-02: 20 mg via INTRAVENOUS

## 2019-07-02 NOTE — Progress Notes (Signed)
The patient was seen in the infusion room today as she was receiving rituximab.  Her vital signs initially showed a blood pressure of 170/74 with a pulse rate of 104.  She reported that her heart was fluttering.  She has a history of the same with a diagnosis of a mitral valve prolapse.  She was receiving rituximab at 183 mL/h.  This was paused with resolution of the patient's symptoms.  She was restarted at 137 mL/h and was able to complete the remainder of her infusion without any additional issues or concerns.  Her physical exam showed that her lungs were clear to auscultation bilaterally without wheezes rales or rhonchi and her heart sounds were normal with a regular rate and rhythm without murmurs rubs or gallops.  The patient was reassured.  Sandi Mealy, MHS, PA-C Physician Assistant

## 2019-07-02 NOTE — Progress Notes (Signed)
1238-Patient stated she felt a "flutter" sensation in her chest. Rituxan infusion paused and VS checked (BP 170/74, HR 104). Patient denies SOB. Patient states she has had this feeling before and is scheduled to see a cardiologist on 07/30/19.  Patient states she was prescribed propanolol by her PCP while she is awaiting her cardiologist evaluation, but patient has not started taking them yet.  Sandi Mealy, PA notified and came to bedside to evaluate patient.  1249--Ok given by Sandi Mealy, PA to restart Rituxan infusion. Rituxan infusion restarted at previous rate of infusion. Patient tolerated the remainder of the infusion well with no "flutter" feeling. VSS upon patient leaving unit.

## 2019-07-02 NOTE — Patient Instructions (Signed)
[facility] Discharge Instructions for Patients Receiving Chemotherapy  Today you received the following chemotherapy agents: Rituxan   To help prevent nausea and vomiting after your treatment, we encourage you to take your nausea medication as directed.    If you develop nausea and vomiting that is not controlled by your nausea medication, call the clinic.   BELOW ARE SYMPTOMS THAT SHOULD BE REPORTED IMMEDIATELY:  *FEVER GREATER THAN 100.5 F  *CHILLS WITH OR WITHOUT FEVER  NAUSEA AND VOMITING THAT IS NOT CONTROLLED WITH YOUR NAUSEA MEDICATION  *UNUSUAL SHORTNESS OF BREATH  *UNUSUAL BRUISING OR BLEEDING  TENDERNESS IN MOUTH AND THROAT WITH OR WITHOUT PRESENCE OF ULCERS  *URINARY PROBLEMS  *BOWEL PROBLEMS  UNUSUAL RASH Items with * indicate a potential emergency and should be followed up as soon as possible.  Feel free to call the clinic you have any questions or concerns. The clinic phone number is [phone].  Please show the CHEMO ALERT CARD at check-in to the Emergency Department and triage nurse.   

## 2019-07-06 ENCOUNTER — Ambulatory Visit: Payer: PPO | Attending: Internal Medicine

## 2019-07-06 DIAGNOSIS — Z23 Encounter for immunization: Secondary | ICD-10-CM | POA: Insufficient documentation

## 2019-07-06 NOTE — Progress Notes (Signed)
   Covid-19 Vaccination Clinic  Name:  Jessikah Marinko    MRN: [id number] DOB: 1945/10/06  07/06/2019  Ms. Keitz was observed post Covid-19 immunization for 15 minutes without incident. She was provided with Vaccine Information Sheet and instruction to access the V-Safe system.   Ms. Kalu was instructed to call 911 with any severe reactions post vaccine: Marland Kitchen Difficulty breathing  . Swelling of face and throat  . A fast heartbeat  . A bad rash all over body  . Dizziness and weakness   Immunizations Administered    Name Date Dose VIS Date Route   Pfizer COVID-19 Vaccine 07/06/2019  9:52 AM 0.3 mL 04/11/2019 Intramuscular   Manufacturer: [place]   Lot: [id number]   [place]: [id number]

## 2019-07-09 ENCOUNTER — Other Ambulatory Visit: Payer: Self-pay

## 2019-07-09 ENCOUNTER — Inpatient Hospital Stay: Payer: PPO

## 2019-07-09 VITALS — BP 125/72 | HR 91 | Temp 98.2°F | Resp 18

## 2019-07-09 DIAGNOSIS — C9110 Chronic lymphocytic leukemia of B-cell type not having achieved remission: Secondary | ICD-10-CM

## 2019-07-09 DIAGNOSIS — Z5112 Encounter for antineoplastic immunotherapy: Secondary | ICD-10-CM | POA: Diagnosis not present

## 2019-07-09 LAB — CBC WITH DIFFERENTIAL (CANCER CENTER ONLY)
Abs Immature Granulocytes: 0.15 10*3/uL — ABNORMAL HIGH (ref 0.00–0.07)
Basophils Absolute: 0.1 10*3/uL (ref 0.0–0.1)
Basophils Relative: 1 %
Eosinophils Absolute: 1.8 10*3/uL — ABNORMAL HIGH (ref 0.0–0.5)
Eosinophils Relative: 11 %
HCT: 42.4 % (ref 36.0–46.0)
Hemoglobin: 13.8 g/dL (ref 12.0–15.0)
Immature Granulocytes: 1 %
Lymphocytes Relative: 64 %
Lymphs Abs: 10.3 10*3/uL — ABNORMAL HIGH (ref 0.7–4.0)
MCH: 29.6 pg (ref 26.0–34.0)
MCHC: 32.5 g/dL (ref 30.0–36.0)
MCV: 91 fL (ref 80.0–100.0)
Monocytes Absolute: 0.8 10*3/uL (ref 0.1–1.0)
Monocytes Relative: 5 %
Neutro Abs: 2.8 10*3/uL (ref 1.7–7.7)
Neutrophils Relative %: 18 %
Platelet Count: 209 10*3/uL (ref 150–400)
RBC: 4.66 MIL/uL (ref 3.87–5.11)
RDW: 15.1 % (ref 11.5–15.5)
WBC Count: 16 10*3/uL — ABNORMAL HIGH (ref 4.0–10.5)
nRBC: 0 % (ref 0.0–0.2)

## 2019-07-09 LAB — CMP (CANCER CENTER ONLY)
ALT: 13 U/L (ref 0–44)
AST: 18 U/L (ref 15–41)
Albumin: 3.5 g/dL (ref 3.5–5.0)
Alkaline Phosphatase: 78 U/L (ref 38–126)
Anion gap: 7 (ref 5–15)
BUN: 17 mg/dL (ref 8–23)
CO2: 25 mmol/L (ref 22–32)
Calcium: 8.8 mg/dL — ABNORMAL LOW (ref 8.9–10.3)
Chloride: 108 mmol/L (ref 98–111)
Creatinine: 0.81 mg/dL (ref 0.44–1.00)
GFR, Est AFR Am: >60 mL/min (ref >60)
GFR, Estimated: >60 mL/min (ref >60)
Glucose, Bld: 111 mg/dL — ABNORMAL HIGH (ref 70–99)
Potassium: 4.1 mmol/L (ref 3.5–5.1)
Sodium: 140 mmol/L (ref 135–145)
Total Bilirubin: 0.7 mg/dL (ref 0.3–1.2)
Total Protein: 5.8 g/dL — ABNORMAL LOW (ref 6.5–8.1)

## 2019-07-09 MED ORDER — FAMOTIDINE IN NACL 20-0.9 MG/50ML-% IV SOLN
INTRAVENOUS | Status: AC
  Filled 2019-07-09: qty 50

## 2019-07-09 MED ORDER — METHYLPREDNISOLONE SODIUM SUCC 125 MG IJ SOLR
60.0000 mg | Freq: Once | INTRAMUSCULAR | Status: AC
  Administered 2019-07-09: 60 mg via INTRAVENOUS

## 2019-07-09 MED ORDER — DIPHENHYDRAMINE HCL 25 MG PO CAPS
50.0000 mg | ORAL_CAPSULE | Freq: Once | ORAL | Status: AC
  Administered 2019-07-09: 50 mg via ORAL

## 2019-07-09 MED ORDER — ACETAMINOPHEN 325 MG PO TABS
ORAL_TABLET | ORAL | Status: AC
  Filled 2019-07-09: qty 2

## 2019-07-09 MED ORDER — ACETAMINOPHEN 325 MG PO TABS
650.0000 mg | ORAL_TABLET | Freq: Once | ORAL | Status: AC
  Administered 2019-07-09: 650 mg via ORAL

## 2019-07-09 MED ORDER — METHYLPREDNISOLONE SODIUM SUCC 125 MG IJ SOLR
INTRAMUSCULAR | Status: AC
  Filled 2019-07-09: qty 2

## 2019-07-09 MED ORDER — FAMOTIDINE IN NACL 20-0.9 MG/50ML-% IV SOLN
20.0000 mg | Freq: Once | INTRAVENOUS | Status: AC
  Administered 2019-07-09: 20 mg via INTRAVENOUS

## 2019-07-09 MED ORDER — SODIUM CHLORIDE 0.9% FLUSH
10.0000 mL | INTRAVENOUS | Status: DC | PRN
  Filled 2019-07-09: qty 10

## 2019-07-09 MED ORDER — DIPHENHYDRAMINE HCL 25 MG PO CAPS
ORAL_CAPSULE | ORAL | Status: AC
  Filled 2019-07-09: qty 2

## 2019-07-09 MED ORDER — SODIUM CHLORIDE 0.9 % IV SOLN
Freq: Once | INTRAVENOUS | Status: AC
  Filled 2019-07-09: qty 250

## 2019-07-09 MED ORDER — SODIUM CHLORIDE 0.9 % IV SOLN
375.0000 mg/m2 | Freq: Once | INTRAVENOUS | Status: AC
  Administered 2019-07-09: 700 mg via INTRAVENOUS
  Filled 2019-07-09: qty 20

## 2019-07-09 MED ORDER — HEPARIN SOD (PORK) LOCK FLUSH 100 UNIT/ML IV SOLN
500.0000 [IU] | Freq: Once | INTRAVENOUS | Status: DC | PRN
  Filled 2019-07-09: qty 5

## 2019-07-09 NOTE — Patient Instructions (Signed)
[facility] Discharge Instructions for Patients Receiving Chemotherapy  Today you received the following chemotherapy agents: Rituxan   To help prevent nausea and vomiting after your treatment, we encourage you to take your nausea medication as directed.    If you develop nausea and vomiting that is not controlled by your nausea medication, call the clinic.   BELOW ARE SYMPTOMS THAT SHOULD BE REPORTED IMMEDIATELY:  *FEVER GREATER THAN 100.5 F  *CHILLS WITH OR WITHOUT FEVER  NAUSEA AND VOMITING THAT IS NOT CONTROLLED WITH YOUR NAUSEA MEDICATION  *UNUSUAL SHORTNESS OF BREATH  *UNUSUAL BRUISING OR BLEEDING  TENDERNESS IN MOUTH AND THROAT WITH OR WITHOUT PRESENCE OF ULCERS  *URINARY PROBLEMS  *BOWEL PROBLEMS  UNUSUAL RASH Items with * indicate a potential emergency and should be followed up as soon as possible.  Feel free to call the clinic you have any questions or concerns. The clinic phone number is [phone].  Please show the CHEMO ALERT CARD at check-in to the Emergency Department and triage nurse.   

## 2019-07-11 DIAGNOSIS — R05 Cough: Secondary | ICD-10-CM | POA: Diagnosis not present

## 2019-07-11 DIAGNOSIS — J90 Pleural effusion, not elsewhere classified: Secondary | ICD-10-CM | POA: Diagnosis not present

## 2019-07-11 DIAGNOSIS — L03114 Cellulitis of left upper limb: Secondary | ICD-10-CM | POA: Diagnosis not present

## 2019-07-15 DIAGNOSIS — D2372 Other benign neoplasm of skin of left lower limb, including hip: Secondary | ICD-10-CM | POA: Diagnosis not present

## 2019-07-15 DIAGNOSIS — L821 Other seborrheic keratosis: Secondary | ICD-10-CM | POA: Diagnosis not present

## 2019-07-24 NOTE — Progress Notes (Signed)
Referring-Nicole Tamala Julian, MD Reason for referral-palpitations  HPI: 74 year old female for evaluation of palpitations at request of [name], MD. CTA August 2020 showed no aneurysm or dissection.  There was increase in lymphadenopathy (Pt with known CLL).  Laboratories June 10, 2019 showed free T4 1.08 and TSH 4.25.  Patient has had occasional skips in the past but no sustained palpitations.  In January after drinking a cup of coffee she developed sustained palpitations for 45 seconds described as her heart racing.  No associated chest pain, dyspnea or syncope.  She has continued to have occasional palpitations since.  She has some dyspnea on exertion and chronic mild pedal edema.  No orthopnea or PND.  She does not have exertional chest pain and no history of syncope.  Cardiology now asked to evaluate.  Current Outpatient Medications  Medication Sig Dispense Refill  . levothyroxine (SYNTHROID, LEVOTHROID) 25 MCG tablet Take 25 mcg by mouth daily before breakfast.      No current facility-administered medications for this visit.    Allergies  Allergen Reactions  . Erythromycin Palpitations     Past Medical History:  Diagnosis Date  . CLL (chronic lymphocytic leukemia) ([place])   . Hypertension   . Hypothyroid   . MVP (mitral valve prolapse)   . Rheumatoid arthritis [facility])     Past Surgical History:  Procedure Laterality Date  . ABDOMINAL HYSTERECTOMY    . TONSILLECTOMY      Social History   Socioeconomic History  . Marital status: Married    Spouse name: Not on file  . Number of children: 3  . Years of education: Not on file  . Highest education level: Not on file  Occupational History  . Not on file  Tobacco Use  . Smoking status: Never Smoker  . Smokeless tobacco: Never Used  Substance and Sexual Activity  . Alcohol use: Yes    Comment: Rare  . Drug use: Never  . Sexual activity: Not on file  Other Topics Concern  . Not on file  Social History Narrative   . Not on file   Social Determinants of Health   Financial Resource Strain:   . Difficulty of Paying Living Expenses:   Food Insecurity:   . Worried About Charity fundraiser in the Last Year:   . Arboriculturist in the Last Year:   Transportation Needs:   . Film/video editor (Medical):   Marland Kitchen Lack of Transportation (Non-Medical):   Physical Activity:   . Days of Exercise per Week:   . Minutes of Exercise per Session:   Stress:   . Feeling of Stress :   Social Connections:   . Frequency of Communication with Friends and Family:   . Frequency of Social Gatherings with Friends and Family:   . Attends Religious Services:   . Active Member of Clubs or Organizations:   . Attends Archivist Meetings:   Marland Kitchen Marital Status:   Intimate Partner Violence:   . Fear of Current or Ex-Partner:   . Emotionally Abused:   Marland Kitchen Physically Abused:   . Sexually Abused:     Family History  Problem Relation Age of Onset  . Congestive Heart Failure Father     ROS: Peripheral neuropathy, Gastrosoft reflux disease but no fevers or chills, productive cough, hemoptysis, dysphasia, odynophagia, melena, hematochezia, dysuria, hematuria, rash, seizure activity, orthopnea, PND, claudication. Remaining systems are negative.  Physical Exam:   Blood pressure (!) 142/78, pulse 89, height  5' 6.5" (1.689 m), weight 183 lb 6.4 oz (83.2 kg).  General:  Well developed/well nourished in NAD Skin warm/dry Patient not depressed No peripheral clubbing Back-normal HEENT-normal/normal eyelids Neck supple/normal carotid upstroke bilaterally; no bruits; no JVD; no thyromegaly chest - CTA/ normal expansion CV - RRR/normal S1 and S2; no murmurs, rubs or gallops;  PMI nondisplaced Abdomen -NT/ND, no HSM, no mass, + bowel sounds, no bruit 2+ femoral pulses, no bruits Ext-trace to 1+ edema, no chords, 2+ DP Neuro-grossly nonfocal  ECG -sinus rhythm at a rate of 89, no ST changes.  Personally  reviewed  A/P  1 palpitations-etiology unclear.  Some of her previous symptoms sound likely to be PVCs.  However some episodes more sustained recently.  We will arrange echocardiogram to assess LV function.  Schedule event monitor to further assess.  2 H/O MVP-repeat echocardiogram.  3 CLL-Per oncology.  Kirk Ruths, MD

## 2019-07-30 ENCOUNTER — Ambulatory Visit: Payer: PPO | Admitting: Cardiology

## 2019-07-30 ENCOUNTER — Other Ambulatory Visit: Payer: Self-pay

## 2019-07-30 ENCOUNTER — Telehealth: Payer: Self-pay | Admitting: Radiology

## 2019-07-30 ENCOUNTER — Encounter: Payer: Self-pay | Admitting: Cardiology

## 2019-07-30 VITALS — BP 142/78 | HR 89 | Ht 66.5 in | Wt 183.4 lb

## 2019-07-30 DIAGNOSIS — R002 Palpitations: Secondary | ICD-10-CM

## 2019-07-30 DIAGNOSIS — I341 Nonrheumatic mitral (valve) prolapse: Secondary | ICD-10-CM

## 2019-07-30 NOTE — Telephone Encounter (Signed)
Enrolled patient for a 30 day Event Monitor to be mailed to patients home  

## 2019-07-30 NOTE — Patient Instructions (Signed)
Medication Instructions:  NO CHANGE *If you need a refill on your cardiac medications before your next appointment, please call your pharmacy*   Lab Work: If you have labs (blood work) drawn today and your tests are completely normal, you will receive your results only by: Marland Kitchen MyChart Message (if you have MyChart) OR . A paper copy in the mail If you have any lab test that is abnormal or we need to change your treatment, we will call you to review the results.   Testing/Procedures: Your physician has requested that you have an echocardiogram. Echocardiography is a painless test that uses sound waves to create images of your heart. It provides your doctor with information about the size and shape of your heart and how well your heart's chambers and valves are working. This procedure takes approximately one hour. There are no restrictions for this procedure.1ST FLOOR IMAGING DEPARTMENT [place] THE [place] [facility] [place]  Your physician has recommended that you wear a 30 DAY event monitor. Event monitors are medical devices that record the heart's electrical activity. Doctors most often [place] these monitors to diagnose arrhythmias. Arrhythmias are problems with the speed or rhythm of the heartbeat. The monitor is a small, portable device. You can wear one while you do your normal daily activities. This is usually used to diagnose what is causing palpitations/syncope (passing out).WILL BE MAILED  Follow-Up: At [facility], you and your health needs are our priority.  As part of our continuing mission to provide you with exceptional heart care, we have created designated Provider Care Teams.  These Care Teams include your primary Cardiologist (physician) and Advanced Practice Providers (APPs -  Physician Assistants and Nurse Practitioners) who all work together to provide you with the care you need, when you need it.  We recommend signing up for the patient portal called  "MyChart".  Sign up information is provided on this After Visit Summary.  MyChart is used to connect with patients for Virtual Visits (Telemedicine).  Patients are able to view lab/test results, encounter notes, upcoming appointments, etc.  Non-urgent messages can be sent to your provider as well.   To learn more about what you can do with MyChart, go to [email or web address].    Your next appointment:   6 week(s)  The format for your next appointment:   In Person  Provider:   Kirk Ruths, MD

## 2019-08-04 ENCOUNTER — Ambulatory Visit (HOSPITAL_BASED_OUTPATIENT_CLINIC_OR_DEPARTMENT_OTHER)
Admission: RE | Admit: 2019-08-04 | Discharge: 2019-08-04 | Disposition: A | Discharge status: Home / Self Care | Payer: PPO | Source: Ambulatory Visit | Attending: Cardiology | Admitting: Cardiology

## 2019-08-04 ENCOUNTER — Other Ambulatory Visit: Payer: Self-pay

## 2019-08-04 DIAGNOSIS — J90 Pleural effusion, not elsewhere classified: Secondary | ICD-10-CM | POA: Diagnosis not present

## 2019-08-04 DIAGNOSIS — I10 Essential (primary) hypertension: Secondary | ICD-10-CM | POA: Insufficient documentation

## 2019-08-04 DIAGNOSIS — R002 Palpitations: Secondary | ICD-10-CM | POA: Diagnosis not present

## 2019-08-04 NOTE — Progress Notes (Signed)
  Echocardiogram 2D Echocardiogram has been performed.  Nicole Campbell 08/04/2019, 11:03 AM

## 2019-08-07 ENCOUNTER — Encounter: Payer: Self-pay | Admitting: Internal Medicine

## 2019-08-07 ENCOUNTER — Ambulatory Visit (INDEPENDENT_AMBULATORY_CARE_PROVIDER_SITE_OTHER): Payer: PPO

## 2019-08-07 ENCOUNTER — Other Ambulatory Visit: Payer: Self-pay

## 2019-08-07 ENCOUNTER — Ambulatory Visit: Payer: PPO | Admitting: Internal Medicine

## 2019-08-07 DIAGNOSIS — R0600 Dyspnea, unspecified: Secondary | ICD-10-CM

## 2019-08-07 DIAGNOSIS — E039 Hypothyroidism, unspecified: Secondary | ICD-10-CM

## 2019-08-07 DIAGNOSIS — R0609 Other forms of dyspnea: Secondary | ICD-10-CM | POA: Insufficient documentation

## 2019-08-07 DIAGNOSIS — J90 Pleural effusion, not elsewhere classified: Secondary | ICD-10-CM

## 2019-08-07 LAB — D-DIMER, QUANTITATIVE: D-Dimer, Quant: 0.95 mcg/mL FEU — ABNORMAL HIGH (ref <0.50)

## 2019-08-07 LAB — BRAIN NATRIURETIC PEPTIDE: Pro B Natriuretic peptide (BNP): 30 pg/mL (ref 0.0–100.0)

## 2019-08-07 LAB — TSH: TSH: 5.11 u[IU]/mL — ABNORMAL HIGH (ref 0.35–4.50)

## 2019-08-07 LAB — SEDIMENTATION RATE: Sed Rate: 6 mm/hr (ref 0–30)

## 2019-08-07 NOTE — Assessment & Plan Note (Signed)
No evidence of chf/ anemia or metabolic abnormality to include thyroid issues as tsh is only minimally elevated (though will likely need additional supplementation.           Each maintenance medication was reviewed in detail including emphasizing most importantly the difference between maintenance and prns and under what circumstances the prns are to be triggered using an action plan format where appropriate.  Total time for H and P, chart review, counseling, teaching device and generating customized AVS unique to this office visit / charting = 60 min

## 2019-08-07 NOTE — Patient Instructions (Addendum)
Please remember to go to the lab and x-ray department   for your tests - we will call you with the results when they are available.     I'll set up follow up after review of the above.   Add:  No obvious dx with bilateral effusions likely worse on L > proceed with dx/therapeutic L t centesis

## 2019-08-07 NOTE — Assessment & Plan Note (Signed)
Onset of symptoms Feb 2021  - L thoracentesis  I believe it's worse on the Left but def bilateral with limited ddx in pt with RA and CLL to include RA and CLL and idiosyncratic drug reaction (note elevated Eos) .  No evidence of chf/ low alb or liver dz/ascites  - the elevated TSH is not enough to cause this.   rec tap the larger dry and sent for the usual studies to include cytology with flow cytometry.   Discussed in detail all the  indications, usual  risks and alternatives  relative to the benefits with patient who agrees to proceed with w/u as outlined.

## 2019-08-07 NOTE — Assessment & Plan Note (Signed)
Lab Results  Component Value Date   TSH 5.11 (H) 08/07/2019     Will likely need adjustment per PCP> will send copy

## 2019-08-07 NOTE — Progress Notes (Addendum)
Nicole Campbell, female    DOB: [birth year],    MRN: [id number]   Brief patient profile:  36 yowf never smoker with RA onset 2016 while in [place] and moved to [place] rx mtx and various other meds [name] then noted elevated wbc Nov 2019 dx CLL observation rx intially then Rituxan started 07/2018 which helped the CLL > RA symptoms tried mtx again >> "horrible low back pain" with CT 12/12/2018 neg for pl effusion or ILD  better p pred rx then tapered off by Feb 1st covid shot Feb 12th, then feb 20th 2021 new dry cough indolent onset present daily > March 12 at [place] cxr showed fluid rec no rx per pt   referred to pulmonary clinic 08/07/2019 by Dr   [name]  2nd shot March 7th 2021 > rx 10 days abx for ? Infection at site  = doxycycline   History of Present Illness  08/07/2019  Pulmonary/ 1st office eval/Xzavian Semmel RA pt with effusion but RA symptoms are better  Chief Complaint  Patient presents with  . Consult    Referred by Dr. [name] for pleural effusion and cough. Non productive cough.   Dyspnea:  No MMRC2 = can't walk a nl pace on a flat grade s sob but does fine slow and flat [employer] Cough: dry/ worse with talking/ laughing  Sleep: avg 45 degrees x 6 months due to breathing better  SABA use: none Worse than usual swelling R > L first noted in 2016   No obvious day to day or daytime variability or assoc excess/ purulent sputum or mucus plugs or hemoptysis or cp or chest tightness, subjective wheeze or overt sinus or hb symptoms.   Sleeping as above without nocturnal  or early am exacerbation  of respiratory  c/o's or need for noct saba. Also denies any obvious fluctuation of symptoms with weather or environmental changes or other aggravating or alleviating factors except as outlined above   No unusual exposure hx or h/o childhood pna/ asthma or knowledge of premature birth.  Current Allergies, Complete Past Medical History, Past Surgical History, Family History, and  Social History were reviewed in [employer] record.  ROS  The following are not active complaints unless bolded Hoarseness, sore throat, dysphagia, dental problems, itching, sneezing,  nasal congestion or discharge of excess mucus or purulent secretions, ear ache,   fever, chills, sweats, unintended wt loss or wt gain, classically pleuritic or exertional cp,  orthopnea pnd or arm/hand swelling  or leg swelling, presyncope, palpitations, abdominal pain, anorexia, nausea, vomiting, diarrhea  or change in bowel habits or change in bladder habits, change in stools or change in urine, dysuria, hematuria,  rash, arthralgias, visual complaints, headache, numbness, weakness or ataxia or problems with walking or coordination,  change in mood or  memory.             Past Medical History:  Diagnosis Date  . CLL (chronic lymphocytic leukemia) ([place])   . Hypertension   . Hypothyroid   . MVP (mitral valve prolapse)   . Rheumatoid arthritis ([place])     Outpatient Medications Prior to Visit  Medication Sig Dispense Refill  . levothyroxine (SYNTHROID, LEVOTHROID) 25 MCG tablet Take 25 mcg by mouth daily before breakfast.      No facility-administered medications prior to visit.     Objective:     BP 138/82   Pulse 88   Temp 98.2 F (36.8 C) (Temporal)  Ht 5' 6.5" (1.689 m)   Wt 186 lb 6.4 oz (84.6 kg)   SpO2 96% Comment: on RA  BMI 29.64 kg/m   SpO2: 96 %(on RA)   Pleasant amb wf nad   HEENT : pt wearing mask not removed for exam due to covid -19 concerns.    NECK :  without JVD/Nodes/TM/ nl carotid upstrokes bilaterally   LUNGS: no acc muscle use,  Nl contour chest with decreased bs/ dullness to percussion =  bilaterally without cough on insp or exp maneuvers   CV:  RRR ? S3 no murmur or increase in P2, and R> > L pitting edema both lower ext    ABD:  soft and nontender with nl inspiratory excursion in the supine position. No bruits or organomegaly  appreciated, bowel sounds nl  MS:  Nl gait/ ext warm without deformities, calf tenderness, cyanosis or clubbing No obvious joint restrictions   SKIN: warm and dry without lesions    NEURO:  alert, approp, nl sensorium with  no motor or cerebellar deficits apparent.        CXR PA and Lateral:   08/07/2019 :     CXR PA and Lateral:   08/07/2019 :    I personally reviewed images and agree with radiology impression as follows:     1. Moderate bilateral pleural effusions, of unclear etiology, increased in size from the prior study, with associated mild linear lung base atelectasis. 2. No evidence of pneumonia or pulmonary edema. Heart is normal in size. 3. Small of defined lucency noted in the proximal right humerus. Consider multiple myeloma in the proper clinical setting. No discrete lytic lesions were evident on the CT a chest, abdomen and pelvis from 12/12/2018.     Labs ordered/ reviewed:      Chemistry      Component Value Date/Time   NA 140 07/09/2019 0854   K 4.1 07/09/2019 0854   CL 108 07/09/2019 0854   CO2 25 07/09/2019 0854   BUN 17 07/09/2019 0854   CREATININE 0.81 07/09/2019 0854      Component Value Date/Time   CALCIUM 8.8 (L) 07/09/2019 0854   ALKPHOS 78 07/09/2019 0854   AST 18 07/09/2019 0854   ALT 13 07/09/2019 0854   BILITOT 0.7 07/09/2019 0854     Albumin                      3.5                 07/09/19       Lab Results  Component Value Date   WBC 16.0 (H) 07/09/2019   HGB 13.8 07/09/2019   HCT 42.4 07/09/2019   MCV 91.0 07/09/2019   PLT 209 07/09/2019       EOS                                                              1.8                                     07/09/2019   Lab Results  Component Value Date   DDIMER 0.95 (H) 08/07/2019  Lab Results  Component Value Date   TSH 5.11 (H) 08/07/2019     Lab Results  Component Value Date   PROBNP 30.0 08/07/2019       Lab Results  Component Value Date   ESRSEDRATE 6 08/07/2019             Assessment   Pleural effusion, bilateral Onset of symptoms Feb 2021  - L thoracentesis recommended   I believe it's worse on the Left but def bilateral with limited ddx in pt with RA and CLL to include RA and CLL and idiosyncratic drug reaction (note elevated Eos) .  No evidence of chf/ low alb or liver dz/ascites  - the elevated TSH is not enough to cause this.   rec tap the larger dry and sent for the usual studies to include cytology with flow cytometry.   Discussed in detail all the  indications, usual  risks and alternatives  relative to the benefits with patient who agrees to proceed with w/u as outlined.       DOE (dyspnea on exertion) No evidence of chf/ anemia or metabolic abnormality to include thyroid issues as tsh is only minimally elevated (though will likely need additional supplementation.      Hypothyroidism, acquired Lab Results  Component Value Date   TSH 5.11 (H) 08/07/2019     Will likely need adjustment per PCP> will send copy   Each maintenance medication was reviewed in detail including emphasizing most importantly the difference between maintenance and prns and under what circumstances the prns are to be triggered using an action plan format where appropriate.  Total time for H and P, chart review, counseling, teaching device and generating customized AVS unique to this office visit / charting = 60 min   Christinia Gully, MD 08/07/2019

## 2019-08-08 ENCOUNTER — Encounter (INDEPENDENT_AMBULATORY_CARE_PROVIDER_SITE_OTHER): Payer: PPO

## 2019-08-08 ENCOUNTER — Telehealth: Payer: Self-pay | Admitting: Internal Medicine

## 2019-08-08 DIAGNOSIS — K5900 Constipation, unspecified: Secondary | ICD-10-CM | POA: Diagnosis not present

## 2019-08-08 DIAGNOSIS — R002 Palpitations: Secondary | ICD-10-CM

## 2019-08-08 DIAGNOSIS — K625 Hemorrhage of anus and rectum: Secondary | ICD-10-CM | POA: Diagnosis not present

## 2019-08-08 DIAGNOSIS — K649 Unspecified hemorrhoids: Secondary | ICD-10-CM | POA: Diagnosis not present

## 2019-08-08 NOTE — Progress Notes (Signed)
LMTCB

## 2019-08-08 NOTE — Telephone Encounter (Signed)
I called and spoke with the patient and I advised her of her Covid test for tomorrow and she said that they did call her back and she is aware of all her upcoming appointments and information for her Thoracentesis. Nothing further is needed.

## 2019-08-09 ENCOUNTER — Other Ambulatory Visit (HOSPITAL_COMMUNITY)
Admission: RE | Admit: 2019-08-09 | Discharge: 2019-08-09 | Disposition: A | Discharge status: Home / Self Care | Payer: PPO | Source: Ambulatory Visit | Attending: Internal Medicine | Admitting: Internal Medicine

## 2019-08-09 DIAGNOSIS — Z20822 Contact with and (suspected) exposure to covid-19: Secondary | ICD-10-CM | POA: Diagnosis not present

## 2019-08-09 DIAGNOSIS — Z01812 Encounter for preprocedural laboratory examination: Secondary | ICD-10-CM | POA: Insufficient documentation

## 2019-08-09 LAB — SARS CORONAVIRUS 2 (TAT 6-24 HRS): SARS Coronavirus 2: NEGATIVE

## 2019-08-11 ENCOUNTER — Other Ambulatory Visit: Payer: Self-pay | Admitting: Internal Medicine

## 2019-08-11 DIAGNOSIS — R7989 Other specified abnormal findings of blood chemistry: Secondary | ICD-10-CM

## 2019-08-11 NOTE — Progress Notes (Signed)
Spoke with pt and notified of results per Dr. Wert. Pt verbalized understanding and denied any questions. 

## 2019-08-12 ENCOUNTER — Other Ambulatory Visit: Payer: Self-pay

## 2019-08-12 ENCOUNTER — Ambulatory Visit (HOSPITAL_COMMUNITY)
Admission: RE | Admit: 2019-08-12 | Discharge: 2019-08-12 | Disposition: A | Discharge status: Home / Self Care | Payer: PPO | Source: Ambulatory Visit | Attending: Internal Medicine | Admitting: Internal Medicine

## 2019-08-12 ENCOUNTER — Institutional Professional Consult (permissible substitution): Payer: PPO | Admitting: Internal Medicine

## 2019-08-12 ENCOUNTER — Ambulatory Visit (HOSPITAL_COMMUNITY)
Admission: RE | Admit: 2019-08-12 | Discharge: 2019-08-12 | Disposition: A | Discharge status: Home / Self Care | Payer: PPO | Source: Ambulatory Visit | Attending: Physician Assistant | Admitting: Physician Assistant

## 2019-08-12 DIAGNOSIS — C9110 Chronic lymphocytic leukemia of B-cell type not having achieved remission: Secondary | ICD-10-CM | POA: Diagnosis not present

## 2019-08-12 DIAGNOSIS — J9811 Atelectasis: Secondary | ICD-10-CM | POA: Diagnosis not present

## 2019-08-12 DIAGNOSIS — J90 Pleural effusion, not elsewhere classified: Secondary | ICD-10-CM | POA: Insufficient documentation

## 2019-08-12 DIAGNOSIS — J948 Other specified pleural conditions: Secondary | ICD-10-CM | POA: Diagnosis not present

## 2019-08-12 LAB — LACTATE DEHYDROGENASE, PLEURAL OR PERITONEAL FLUID: LD, Fluid: 159 U/L — ABNORMAL HIGH (ref 3–23)

## 2019-08-12 LAB — BODY FLUID CELL COUNT WITH DIFFERENTIAL
Lymphs, Fluid: 78 %
Monocyte-Macrophage-Serous Fluid: 18 % — ABNORMAL LOW (ref 50–90)
Neutrophil Count, Fluid: 4 % (ref 0–25)
Total Nucleated Cell Count, Fluid: 11839 cu mm — ABNORMAL HIGH (ref 0–1000)

## 2019-08-12 LAB — PROTEIN, PLEURAL OR PERITONEAL FLUID: Total protein, fluid: 3.2 g/dL

## 2019-08-12 LAB — ALBUMIN, PLEURAL OR PERITONEAL FLUID: Albumin, Fluid: 2.6 g/dL

## 2019-08-12 LAB — GLUCOSE, PLEURAL OR PERITONEAL FLUID: Glucose, Fluid: 105 mg/dL

## 2019-08-12 MED ORDER — LIDOCAINE HCL 1 % IJ SOLN
INTRAMUSCULAR | Status: AC
  Filled 2019-08-12: qty 20

## 2019-08-12 NOTE — Procedures (Signed)
PROCEDURE SUMMARY:  Successful image-guided right thoracentesis. Yielded 1.0 liters of hazy yellow fluid. Patient tolerated procedure well. EBL: Zero No immediate complications.  Specimen was sent for labs. Post procedure CXR shows no pneumothorax.  Significant left pleural effusion seen on [place] today and is amenable to percutaneous drainage - please place new order if left thoracentesis is desired.  Please see imaging section of Epic for full dictation.  Joaquim Nam PA-C 08/12/2019 11:41 AM

## 2019-08-13 LAB — TRIGLYCERIDES, BODY FLUIDS: Triglycerides, Fluid: 22 mg/dL

## 2019-08-13 LAB — FLOW CYTOMETRY REQUEST - FLUID (INPATIENT)

## 2019-08-13 LAB — CYTOLOGY - NON PAP

## 2019-08-13 LAB — SURGICAL PATHOLOGY

## 2019-08-14 ENCOUNTER — Telehealth: Payer: Self-pay | Admitting: Internal Medicine

## 2019-08-14 ENCOUNTER — Other Ambulatory Visit: Payer: Self-pay

## 2019-08-14 ENCOUNTER — Ambulatory Visit (HOSPITAL_COMMUNITY)
Admission: RE | Admit: 2019-08-14 | Discharge: 2019-08-14 | Disposition: A | Discharge status: Home / Self Care | Payer: PPO | Source: Ambulatory Visit | Attending: Internal Medicine | Admitting: Internal Medicine

## 2019-08-14 DIAGNOSIS — R7989 Other specified abnormal findings of blood chemistry: Secondary | ICD-10-CM

## 2019-08-14 NOTE — Telephone Encounter (Signed)
Received call report from [place] with [facility] on [address] on patient's US done on 08/14/19. MW please review the result/impression copied below:  Patient is negative for DVT in both legs   Please advise, thank you.

## 2019-08-14 NOTE — Telephone Encounter (Signed)
Spoke with pt. States that she has been having issues with swelling her legs. Reports that she went for a venous doppler and it came back normal. Pt is now wanting to know who MW thinks she needs to seeing about getting the swelling down in her legs.  MW - please advise. Thanks.

## 2019-08-14 NOTE — Telephone Encounter (Signed)
Heart function is fine and no clots so best to let PCP treat her swelling as will need to monitor kidney function and potassium levels with any diuretic rx and need ongoing evaluation best done by the fewest number of docs  Elevation and elastic hose may help as well

## 2019-08-14 NOTE — Telephone Encounter (Signed)
Spoke with pt. She is aware of MW's response. Nothing further was needed. 

## 2019-08-14 NOTE — Progress Notes (Signed)
Spoke with pt and notified of results per Dr. Wert. Pt verbalized understanding and denied any questions. 

## 2019-08-19 ENCOUNTER — Telehealth: Payer: Self-pay | Admitting: Oncology

## 2019-08-19 DIAGNOSIS — E039 Hypothyroidism, unspecified: Secondary | ICD-10-CM | POA: Diagnosis not present

## 2019-08-19 DIAGNOSIS — R609 Edema, unspecified: Secondary | ICD-10-CM | POA: Diagnosis not present

## 2019-08-19 NOTE — Telephone Encounter (Signed)
Scheduled per sch msg. Called and left msg. Mailed printout  

## 2019-08-21 ENCOUNTER — Inpatient Hospital Stay: Payer: PPO | Attending: Oncology | Admitting: Oncology

## 2019-08-21 ENCOUNTER — Other Ambulatory Visit: Payer: Self-pay

## 2019-08-21 ENCOUNTER — Inpatient Hospital Stay: Payer: PPO

## 2019-08-21 VITALS — BP 146/76 | HR 100 | Temp 98.3°F | Resp 20 | Ht 66.5 in | Wt 182.1 lb

## 2019-08-21 DIAGNOSIS — C9110 Chronic lymphocytic leukemia of B-cell type not having achieved remission: Secondary | ICD-10-CM | POA: Diagnosis not present

## 2019-08-21 DIAGNOSIS — M069 Rheumatoid arthritis, unspecified: Secondary | ICD-10-CM | POA: Diagnosis not present

## 2019-08-21 DIAGNOSIS — Z7189 Other specified counseling: Secondary | ICD-10-CM | POA: Diagnosis not present

## 2019-08-21 LAB — CMP (CANCER CENTER ONLY)
ALT: 14 U/L (ref 0–44)
AST: 22 U/L (ref 15–41)
Albumin: 3.4 g/dL — ABNORMAL LOW (ref 3.5–5.0)
Alkaline Phosphatase: 76 U/L (ref 38–126)
Anion gap: 8 (ref 5–15)
BUN: 15 mg/dL (ref 8–23)
CO2: 27 mmol/L (ref 22–32)
Calcium: 9.3 mg/dL (ref 8.9–10.3)
Chloride: 105 mmol/L (ref 98–111)
Creatinine: 0.85 mg/dL (ref 0.44–1.00)
GFR, Est AFR Am: >60 mL/min (ref >60)
GFR, Estimated: >60 mL/min (ref >60)
Glucose, Bld: 106 mg/dL — ABNORMAL HIGH (ref 70–99)
Potassium: 3.7 mmol/L (ref 3.5–5.1)
Sodium: 140 mmol/L (ref 135–145)
Total Bilirubin: 0.4 mg/dL (ref 0.3–1.2)
Total Protein: 5.9 g/dL — ABNORMAL LOW (ref 6.5–8.1)

## 2019-08-21 LAB — CBC WITH DIFFERENTIAL (CANCER CENTER ONLY)
Abs Immature Granulocytes: 0.17 10*3/uL — ABNORMAL HIGH (ref 0.00–0.07)
Basophils Absolute: 0.1 10*3/uL (ref 0.0–0.1)
Basophils Relative: 1 %
Eosinophils Absolute: 0.5 10*3/uL (ref 0.0–0.5)
Eosinophils Relative: 3 %
HCT: 44.1 % (ref 36.0–46.0)
Hemoglobin: 14.3 g/dL (ref 12.0–15.0)
Immature Granulocytes: 1 %
Lymphocytes Relative: 63 %
Lymphs Abs: 8.6 10*3/uL — ABNORMAL HIGH (ref 0.7–4.0)
MCH: 30 pg (ref 26.0–34.0)
MCHC: 32.4 g/dL (ref 30.0–36.0)
MCV: 92.6 fL (ref 80.0–100.0)
Monocytes Absolute: 0.9 10*3/uL (ref 0.1–1.0)
Monocytes Relative: 7 %
Neutro Abs: 3.4 10*3/uL (ref 1.7–7.7)
Neutrophils Relative %: 25 %
Platelet Count: 242 10*3/uL (ref 150–400)
RBC: 4.76 MIL/uL (ref 3.87–5.11)
RDW: 14.1 % (ref 11.5–15.5)
WBC Count: 13.7 10*3/uL — ABNORMAL HIGH (ref 4.0–10.5)
nRBC: 0 % (ref 0.0–0.2)

## 2019-08-21 NOTE — Progress Notes (Signed)
DISCONTINUE OFF PATHWAY REGIMEN - Lymphoma and CLL   [id number]:Rituximab (Weekly):   Administer weekly:     Rituximab   **Always confirm dose/schedule in your pharmacy ordering system**  REASON: Disease Progression PRIOR TREATMENT: Off Pathway: Rituximab (Weekly) TREATMENT RESPONSE: Partial Response (PR)  START OFF PATHWAY REGIMEN - Lymphoma and CLL   [id number]:Bendamustine + Rituximab (90/375) q28 Days:   A cycle is every 28 days:     Bendamustine      Rituximab-xxxx   **Always confirm dose/schedule in your pharmacy ordering system**  Patient Characteristics: Chronic Lymphocytic Leukemia (CLL), First Line, Treatment Indicated, 17p del (+) or ATM Mutation Positive or TP53 Mutation Positive Disease Type: Chronic Lymphocytic Leukemia (CLL) Disease Type: Not Applicable Disease Type: Not Applicable Line of Therapy: First Line Rai Stage: II Treatment Indicated<= Treatment 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

## 2019-08-21 NOTE — Progress Notes (Signed)
Hematology and Oncology Follow Up Visit  Nicole Campbell [id number] 06-30-1945 74 y.o. 08/21/2019 3:14 PM [name], MDSmith, [name], MD   Principle Diagnosis: 74 year old woman with CLL presented with lymphocytosis and adenopathy in November 2019.  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   Prior therapy:   She is status post rituximab weekly for 4 weeks completed on August 21, 2018. She is status post thoracentesis completed on August 12, 2019 with positive cytology. Rituximab 375 mg per metered square weekly started on October 13 of 2020 and completed on 03/04/2019.  This will be repeated every 3 months last treatment completed in March 2021.  Current therapy: Under evaluation to start salvage chemotherapy.      Interim History: Nicole Campbell is here for a follow-up visit.  Since the last visit, she started developing symptoms of shortness of breath and was evaluated by Dr. Shyrl Numbers regarding these findings.  She underwent a left thoracentesis with the cytology showed positive for lymphocytosis consistent with her CLL.  She reports periodic cough but otherwise feels reasonably well.  Her arthritis is improved at this time.  She denies lymphadenopathy or constitutional symptoms.  She denies any hospitalization or illnesses.          .    Medications: Updated on review. Current Outpatient Medications  Medication Sig Dispense Refill  . levothyroxine (SYNTHROID, LEVOTHROID) 25 MCG tablet Take 25 mcg by mouth daily before breakfast.      No current facility-administered medications for this visit.     Allergies:  Allergies  Allergen Reactions  . Erythromycin Palpitations        Physical Exam:   Blood pressure (!) 146/76, pulse 100, temperature 98.3 F (36.8 C), temperature source Oral, resp. rate 20, height 5' 6.5" (1.689 m), weight 182 lb 1.6 oz (82.6 kg), SpO2 94 %.      ECOG:1      General appearance: Alert,  awake without any distress. Head: Atraumatic without abnormalities Oropharynx: Without any thrush or ulcers. Eyes: No scleral icterus. Lymph nodes: No lymphadenopathy noted in the cervical, supraclavicular, or axillary nodes Heart:regular rate and rhythm, without any murmurs or gallops.   Lung: Clear to auscultation.  Decreased breath sounds at the right base. Abdomin: Soft, nontender without any shifting dullness or ascites. Musculoskeletal: No clubbing or cyanosis. Neurological: No motor or sensory deficits. Skin: No rashes or lesions.                 Lab Results: Lab Results  Component Value Date   WBC 16.0 (H) 07/09/2019   HGB 13.8 07/09/2019   HCT 42.4 07/09/2019   MCV 91.0 07/09/2019   PLT 209 07/09/2019     Chemistry      Component Value Date/Time   NA 140 07/09/2019 0854   K 4.1 07/09/2019 0854   CL 108 07/09/2019 0854   CO2 25 07/09/2019 0854   BUN 17 07/09/2019 0854   CREATININE 0.81 07/09/2019 0854      Component Value Date/Time   CALCIUM 8.8 (L) 07/09/2019 0854   ALKPHOS 78 07/09/2019 0854   AST 18 07/09/2019 0854   ALT 13 07/09/2019 0854   BILITOT 0.7 07/09/2019 0854       Impression and Plan:   74 year old woman with:  1.  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  Now has a pleural effusion involvement.  He status post rituximab therapy outlined above with the  clear progression of disease given her recent pleural involvement.  Treatment options were reviewed at this time which include systemic chemotherapy utilizing a standard rituximab versus oral targeted therapy such as ibrutinib.  Rationale for both approaches were reviewed and complications were discussed in detail.  He is include nausea, vomiting, myelosuppression, neutropenia and sepsis associated with chemotherapy.  Complication associated with ibrutinib including cardiac toxicity as well as bruising.  After discussion today, she is  agreeable to proceed with bendamustine and rituximab which we will start the near future.  I have recommended updating her staging scans prior to proceeding.  This will be scheduled in the next week or so.   2.  Rheumatoid arthritis: Improved at this time with rituximab treatment.  3.  IV access: Rituximab has been infused via peripheral veins and a Port-A-Cath option was reviewed today.  After discussing the risks and benefits today, she opted to continue with peripheral veins use.  4.  Follow-up: We will be in the immediate future to start bendamustine and rituximab.   30  Minutes were spent on this visit.  The time was dedicated to reviewing imaging studies, reviewing pathology results, treatment options and future plan of care discussion.re.   Zola Button, MD 4/22/20213:14 PM

## 2019-08-22 ENCOUNTER — Telehealth: Payer: Self-pay | Admitting: Oncology

## 2019-08-22 NOTE — Telephone Encounter (Signed)
Scheduled per 04/22 los, patient has been called and notified.  

## 2019-08-23 ENCOUNTER — Encounter: Payer: Self-pay | Admitting: Oncology

## 2019-08-28 ENCOUNTER — Other Ambulatory Visit: Payer: Self-pay

## 2019-08-28 ENCOUNTER — Ambulatory Visit (HOSPITAL_COMMUNITY)
Admission: RE | Admit: 2019-08-28 | Discharge: 2019-08-28 | Disposition: A | Discharge status: Home / Self Care | Payer: PPO | Source: Ambulatory Visit | Attending: Oncology | Admitting: Oncology

## 2019-08-28 ENCOUNTER — Encounter (HOSPITAL_COMMUNITY): Payer: Self-pay

## 2019-08-28 DIAGNOSIS — C8590 Non-Hodgkin lymphoma, unspecified, unspecified site: Secondary | ICD-10-CM | POA: Diagnosis not present

## 2019-08-28 DIAGNOSIS — C9110 Chronic lymphocytic leukemia of B-cell type not having achieved remission: Secondary | ICD-10-CM | POA: Insufficient documentation

## 2019-08-28 MED ORDER — SODIUM CHLORIDE (PF) 0.9 % IJ SOLN
INTRAMUSCULAR | Status: AC
  Filled 2019-08-28: qty 50

## 2019-08-28 MED ORDER — IOHEXOL 300 MG/ML  SOLN
100.0000 mL | Freq: Once | INTRAMUSCULAR | Status: AC | PRN
  Administered 2019-08-28: 100 mL via INTRAVENOUS

## 2019-08-28 NOTE — Progress Notes (Signed)
Pharmacist Chemotherapy Monitoring - Initial Assessment    Anticipated start date: 09/03/2019   Regimen:  . Are orders appropriate based on the patient's diagnosis, regimen, and cycle? Yes . Does the plan date match the patient's scheduled date? Yes . Is the sequencing of drugs appropriate? Yes . Are the premedications appropriate for the patient's regimen? Yes . Prior Authorization for treatment is: Not Started o If applicable, is the correct biosimilar selected based on the patient's insurance? yes  Organ Function and Labs: Marland Kitchen Are dose adjustments needed based on the patient's renal function, hepatic function, or hematologic function? No . Are appropriate labs ordered prior to the start of patient's treatment? Yes . Other organ system assessment, if indicated: N/A . The following baseline labs, if indicated, have been ordered: rituximab: baseline Hepatitis B labs  Dose Assessment: . Are the drug doses appropriate? Yes . Are the following correct: o Drug concentrations Yes o IV fluid compatible with drug Yes o Administration routes Yes o Timing of therapy Yes . If applicable, does the patient have documented access for treatment and/or plans for port-a-cath placement? No; patient refusing port a cath at this time . If applicable, have lifetime cumulative doses been properly documented and assessed? not applicable Lifetime Dose Tracking  No doses have been documented on this patient for the following tracked chemicals: Doxorubicin, Epirubicin, Idarubicin, Daunorubicin, Mitoxantrone, Bleomycin, Oxaliplatin, Carboplatin, Liposomal Doxorubicin  o   Toxicity Monitoring/Prevention: . The patient has the following take home antiemetics prescribed: Ondansetron . The patient has the following take home medications prescribed: N/A . Medication allergies and previous infusion related reactions, if applicable, have been reviewed and addressed. No . The patient's current medication list has been  assessed for drug-drug interactions with their chemotherapy regimen. no significant drug-drug interactions were identified on review.  Order Review: . Are the treatment plan orders signed? Yes . Is the patient scheduled to see a provider prior to their treatment? No  I verify that I have reviewed each item in the above checklist and answered each question accordingly.  Nicole Campbell D 08/28/2019 9:42 AM

## 2019-09-01 ENCOUNTER — Telehealth: Payer: Self-pay

## 2019-09-01 NOTE — Telephone Encounter (Signed)
Patient called stating that she needed some of her appointments in June cancelled due to Dr Alen Blew starting her on a new treatment plan. Patient stated that the appointments were never deleted and she did not want to people to think that she was coming in on those days when she's not supposed to. (6/8, 6/15, 6/22) appointments are now cancelled.

## 2019-09-03 ENCOUNTER — Inpatient Hospital Stay: Payer: PPO | Attending: Oncology

## 2019-09-03 ENCOUNTER — Inpatient Hospital Stay: Payer: PPO

## 2019-09-03 ENCOUNTER — Other Ambulatory Visit: Payer: Self-pay

## 2019-09-03 VITALS — BP 147/73 | HR 96 | Temp 98.1°F | Resp 18

## 2019-09-03 DIAGNOSIS — Z5112 Encounter for antineoplastic immunotherapy: Secondary | ICD-10-CM | POA: Insufficient documentation

## 2019-09-03 DIAGNOSIS — C9110 Chronic lymphocytic leukemia of B-cell type not having achieved remission: Secondary | ICD-10-CM

## 2019-09-03 DIAGNOSIS — Z5111 Encounter for antineoplastic chemotherapy: Secondary | ICD-10-CM | POA: Insufficient documentation

## 2019-09-03 LAB — CBC WITH DIFFERENTIAL (CANCER CENTER ONLY)
Abs Immature Granulocytes: 0.17 10*3/uL — ABNORMAL HIGH (ref 0.00–0.07)
Basophils Absolute: 0.1 10*3/uL (ref 0.0–0.1)
Basophils Relative: 1 %
Eosinophils Absolute: 0.5 10*3/uL (ref 0.0–0.5)
Eosinophils Relative: 3 %
HCT: 43.8 % (ref 36.0–46.0)
Hemoglobin: 14.2 g/dL (ref 12.0–15.0)
Immature Granulocytes: 1 %
Lymphocytes Relative: 68 %
Lymphs Abs: 10.6 10*3/uL — ABNORMAL HIGH (ref 0.7–4.0)
MCH: 29.3 pg (ref 26.0–34.0)
MCHC: 32.4 g/dL (ref 30.0–36.0)
MCV: 90.5 fL (ref 80.0–100.0)
Monocytes Absolute: 1 10*3/uL (ref 0.1–1.0)
Monocytes Relative: 7 %
Neutro Abs: 3.2 10*3/uL (ref 1.7–7.7)
Neutrophils Relative %: 20 %
Platelet Count: 201 10*3/uL (ref 150–400)
RBC: 4.84 MIL/uL (ref 3.87–5.11)
RDW: 13.3 % (ref 11.5–15.5)
WBC Count: 15.6 10*3/uL — ABNORMAL HIGH (ref 4.0–10.5)
nRBC: 0 % (ref 0.0–0.2)

## 2019-09-03 LAB — CMP (CANCER CENTER ONLY)
ALT: 12 U/L (ref 0–44)
AST: 23 U/L (ref 15–41)
Albumin: 3.4 g/dL — ABNORMAL LOW (ref 3.5–5.0)
Alkaline Phosphatase: 72 U/L (ref 38–126)
Anion gap: 9 (ref 5–15)
BUN: 14 mg/dL (ref 8–23)
CO2: 24 mmol/L (ref 22–32)
Calcium: 9.3 mg/dL (ref 8.9–10.3)
Chloride: 107 mmol/L (ref 98–111)
Creatinine: 0.86 mg/dL (ref 0.44–1.00)
GFR, Est AFR Am: >60 mL/min (ref >60)
GFR, Estimated: >60 mL/min (ref >60)
Glucose, Bld: 102 mg/dL — ABNORMAL HIGH (ref 70–99)
Potassium: 4 mmol/L (ref 3.5–5.1)
Sodium: 140 mmol/L (ref 135–145)
Total Bilirubin: 0.7 mg/dL (ref 0.3–1.2)
Total Protein: 5.9 g/dL — ABNORMAL LOW (ref 6.5–8.1)

## 2019-09-03 MED ORDER — DIPHENHYDRAMINE HCL 25 MG PO CAPS
ORAL_CAPSULE | ORAL | Status: AC
  Filled 2019-09-03: qty 2

## 2019-09-03 MED ORDER — SODIUM CHLORIDE 0.9 % IV SOLN
90.0000 mg/m2 | Freq: Once | INTRAVENOUS | Status: AC
  Administered 2019-09-03: 175 mg via INTRAVENOUS
  Filled 2019-09-03: qty 7

## 2019-09-03 MED ORDER — PROCHLORPERAZINE MALEATE 10 MG PO TABS
10.0000 mg | ORAL_TABLET | Freq: Four times a day (QID) | ORAL | 0 refills | Status: DC | PRN
Start: 2019-09-03 — End: 2020-03-17

## 2019-09-03 MED ORDER — SODIUM CHLORIDE 0.9 % IV SOLN
Freq: Once | INTRAVENOUS | Status: AC
  Filled 2019-09-03: qty 250

## 2019-09-03 MED ORDER — SODIUM CHLORIDE 0.9 % IV SOLN
375.0000 mg/m2 | Freq: Once | INTRAVENOUS | Status: AC
  Administered 2019-09-03: 700 mg via INTRAVENOUS
  Filled 2019-09-03: qty 50

## 2019-09-03 MED ORDER — SODIUM CHLORIDE 0.9 % IV SOLN
10.0000 mg | Freq: Once | INTRAVENOUS | Status: AC
  Administered 2019-09-03: 10 mg via INTRAVENOUS
  Filled 2019-09-03: qty 10

## 2019-09-03 MED ORDER — DIPHENHYDRAMINE HCL 25 MG PO CAPS
50.0000 mg | ORAL_CAPSULE | Freq: Once | ORAL | Status: AC
  Administered 2019-09-03: 50 mg via ORAL

## 2019-09-03 MED ORDER — PALONOSETRON HCL INJECTION 0.25 MG/5ML
INTRAVENOUS | Status: AC
  Filled 2019-09-03: qty 5

## 2019-09-03 MED ORDER — PALONOSETRON HCL INJECTION 0.25 MG/5ML
0.2500 mg | Freq: Once | INTRAVENOUS | Status: AC
  Administered 2019-09-03: 0.25 mg via INTRAVENOUS

## 2019-09-03 MED ORDER — ACETAMINOPHEN 325 MG PO TABS
650.0000 mg | ORAL_TABLET | Freq: Once | ORAL | Status: AC
  Administered 2019-09-03: 650 mg via ORAL

## 2019-09-03 MED ORDER — ACETAMINOPHEN 325 MG PO TABS
ORAL_TABLET | ORAL | Status: AC
  Filled 2019-09-03: qty 2

## 2019-09-03 NOTE — Patient Instructions (Signed)
[facility] Discharge Instructions for Patients Receiving Chemotherapy  Today you received the following chemotherapy agents Rituxan and Bendeka.  To help prevent nausea and vomiting after your treatment, we encourage you to take your nausea medication as directed - take compazine for breakthrough nausea.    If you develop nausea and vomiting that is not controlled by your nausea medication, call the clinic.   BELOW ARE SYMPTOMS THAT SHOULD BE REPORTED IMMEDIATELY:  *FEVER GREATER THAN 100.5 F  *CHILLS WITH OR WITHOUT FEVER  NAUSEA AND VOMITING THAT IS NOT CONTROLLED WITH YOUR NAUSEA MEDICATION  *UNUSUAL SHORTNESS OF BREATH  *UNUSUAL BRUISING OR BLEEDING  TENDERNESS IN MOUTH AND THROAT WITH OR WITHOUT PRESENCE OF ULCERS  *URINARY PROBLEMS  *BOWEL PROBLEMS  UNUSUAL RASH Items with * indicate a potential emergency and should be followed up as soon as possible.  Feel free to call the clinic you have any questions or concerns. The clinic phone number is [phone].  Please show the [place] at check-in to the Emergency Department and triage nurse.  Bendamustine Injection What is this medicine? BENDAMUSTINE (BEN da MUS teen) is a chemotherapy drug. It is used to treat chronic lymphocytic leukemia and non-Hodgkin lymphoma. This medicine may be used for other purposes; ask your health care provider or pharmacist if you have questions. COMMON BRAND NAME(S): Kristine Royal, Treanda What should I tell my health care provider before I take this medicine? They need to know if you have any of these conditions:  infection (especially a virus infection such as chickenpox, cold sores, or herpes)  kidney disease  liver disease  an unusual or allergic reaction to bendamustine, mannitol, other medicines, foods, dyes, or preservatives  pregnant or trying to get pregnant  breast-feeding How should I use this medicine? This medicine is for infusion into a  vein. It is given by a health care professional in a hospital or clinic setting. Talk to your pediatrician regarding the use of this medicine in children. Special care may be needed. Overdosage: If you think you have taken too much of this medicine contact a poison control center or emergency room at once. NOTE: This medicine is only for you. Do not share this medicine with others. What if I miss a dose? It is important not to miss your dose. Call your doctor or health care professional if you are unable to keep an appointment. What may interact with this medicine? Do not take this medicine with any of the following medications:  clozapine This medicine may also interact with the following medications:  atazanavir  cimetidine  ciprofloxacin  enoxacin  fluvoxamine  medicines for seizures like carbamazepine and phenobarbital  mexiletine  rifampin  tacrine  thiabendazole  zileuton This list may not describe all possible interactions. Give your health care provider a list of all the medicines, herbs, non-prescription drugs, or dietary supplements you use. Also tell them if you smoke, drink alcohol, or use illegal drugs. Some items may interact with your medicine. What should I watch for while using this medicine? This drug may make you feel generally unwell. This is not uncommon, as chemotherapy can affect healthy cells as well as cancer cells. Report any side effects. Continue your course of treatment even though you feel ill unless your doctor tells you to stop. You may need blood work done while you are taking this medicine. Call your doctor or healthcare provider for advice if you get a fever, chills or sore throat, or other  symptoms of a cold or flu. Do not treat yourself. This drug decreases your body's ability to fight infections. Try to avoid being around people who are sick. This medicine may cause serious skin reactions. They can happen weeks to months after starting the  medicine. Contact your healthcare provider right away if you notice fevers or flu-like symptoms with a rash. The rash may be red or purple and then turn into blisters or peeling of the skin. Or, you might notice a red rash with swelling of the face, lips or lymph nodes in your neck or under your arms. This medicine may increase your risk to bruise or bleed. Call your doctor or healthcare provider if you notice any unusual bleeding. Talk to your doctor about your risk of cancer. You may be more at risk for certain types of cancers if you take this medicine. Do not become pregnant while taking this medicine or for at least 6 months after stopping it. Women should inform their doctor if they wish to become pregnant or think they might be pregnant. Men should not father a child while taking this medicine and for at least 3 months after stopping it. There is a potential for serious side effects to an unborn child. Talk to your healthcare provider or pharmacist for more information. Do not breast-feed an infant while taking this medicine or for at least 1 week after stopping it. This medicine may make it more difficult to father a child. You should talk with your doctor or healthcare provider if you are concerned about your fertility. What side effects may I notice from receiving this medicine? Side effects that you should report to your doctor or health care professional as soon as possible:  allergic reactions like skin rash, itching or hives, swelling of the face, lips, or tongue  low blood counts - this medicine may decrease the number of white blood cells, red blood cells and platelets. You may be at increased risk for infections and bleeding.  rash, fever, and swollen lymph nodes  redness, blistering, peeling, or loosening of the skin, including inside the mouth  signs of infection like fever or chills, cough, sore throat, pain or difficulty passing urine  signs of decreased platelets or bleeding  like bruising, pinpoint red spots on the skin, black, tarry stools, blood in the urine  signs of decreased red blood cells like being unusually weak or tired, fainting spells, lightheadedness  signs and symptoms of kidney injury like trouble passing urine or change in the amount of urine  signs and symptoms of liver injury like dark yellow or brown urine; general ill feeling or flu-like symptoms; light-colored stools; loss of appetite; nausea; right upper belly pain; unusually weak or tired; yellowing of the eyes or skin Side effects that usually do not require medical attention (report to your doctor or health care professional if they continue or are bothersome):  constipation  decreased appetite  diarrhea  headache  mouth sores  nausea, vomiting  tiredness This list may not describe all possible side effects. Call your doctor for medical advice about side effects. You may report side effects to FDA at 1-800-FDA-1088. Where should I keep my medicine? This drug is given in a hospital or clinic and will not be stored at home. NOTE: This sheet is a summary. It may not cover all possible information. If you have questions about this medicine, talk to your doctor, pharmacist, or health care provider.  2020 Elsevier/Gold Standard (2018-07-09 10:26:46)

## 2019-09-04 ENCOUNTER — Other Ambulatory Visit: Payer: Self-pay

## 2019-09-04 ENCOUNTER — Inpatient Hospital Stay: Payer: PPO

## 2019-09-04 VITALS — BP 151/73 | HR 98 | Temp 98.0°F | Resp 18

## 2019-09-04 DIAGNOSIS — Z5112 Encounter for antineoplastic immunotherapy: Secondary | ICD-10-CM | POA: Diagnosis not present

## 2019-09-04 DIAGNOSIS — C9110 Chronic lymphocytic leukemia of B-cell type not having achieved remission: Secondary | ICD-10-CM

## 2019-09-04 MED ORDER — SODIUM CHLORIDE 0.9 % IV SOLN
Freq: Once | INTRAVENOUS | Status: AC
  Filled 2019-09-04: qty 250

## 2019-09-04 MED ORDER — SODIUM CHLORIDE 0.9 % IV SOLN
90.0000 mg/m2 | Freq: Once | INTRAVENOUS | Status: AC
  Administered 2019-09-04: 175 mg via INTRAVENOUS
  Filled 2019-09-04: qty 7

## 2019-09-04 MED ORDER — SODIUM CHLORIDE 0.9 % IV SOLN
10.0000 mg | Freq: Once | INTRAVENOUS | Status: AC
  Administered 2019-09-04: 10 mg via INTRAVENOUS
  Filled 2019-09-04: qty 10

## 2019-09-04 NOTE — Patient Instructions (Signed)
[facility] Discharge Instructions for Patients Receiving Chemotherapy  Today you received the following chemotherapy agents Bendeka.  To help prevent nausea and vomiting after your treatment, we encourage you to take your nausea medication as directed.   If you develop nausea and vomiting that is not controlled by your nausea medication, call the clinic.   BELOW ARE SYMPTOMS THAT SHOULD BE REPORTED IMMEDIATELY:  *FEVER GREATER THAN 100.5 F  *CHILLS WITH OR WITHOUT FEVER  NAUSEA AND VOMITING THAT IS NOT CONTROLLED WITH YOUR NAUSEA MEDICATION  *UNUSUAL SHORTNESS OF BREATH  *UNUSUAL BRUISING OR BLEEDING  TENDERNESS IN MOUTH AND THROAT WITH OR WITHOUT PRESENCE OF ULCERS  *URINARY PROBLEMS  *BOWEL PROBLEMS  UNUSUAL RASH Items with * indicate a potential emergency and should be followed up as soon as possible.  Feel free to call the clinic should you have any questions or concerns. The clinic phone number is [phone].  Please show the CHEMO ALERT CARD at check-in to the Emergency Department and triage nurse.   

## 2019-09-05 ENCOUNTER — Telehealth: Payer: Self-pay

## 2019-09-05 NOTE — Telephone Encounter (Signed)
Patient called office stating she has had "sudden feelings of anxiousness and a rapid heart rate" twice this morning. Patient states these have only lasted for about a minute. Patient states she has had this happen in the past when she has taken steroids, but is concerned that it may be from the [place]. Patient received first and second infusion of bendeka on 5/5 and 5/6. Patient also received 10 mg Decadron IV as premeds prior to both infusions.  Patient is requesting the decadron dose be reduced for her next cycle of treatment if this is due to the decadron. Per Dr Alen Blew, this feeling is most likely due to the decadron and he will reduce her decadron dose for her next treatment cycle.  Called and informed patient of Dr. Hazeline Junker response. Patient verbalized understanding and all questions were answered during the phone call.

## 2019-09-08 DIAGNOSIS — Z0000 Encounter for general adult medical examination without abnormal findings: Secondary | ICD-10-CM | POA: Diagnosis not present

## 2019-09-08 DIAGNOSIS — E7800 Pure hypercholesterolemia, unspecified: Secondary | ICD-10-CM | POA: Diagnosis not present

## 2019-09-08 DIAGNOSIS — M069 Rheumatoid arthritis, unspecified: Secondary | ICD-10-CM | POA: Diagnosis not present

## 2019-09-08 DIAGNOSIS — C9110 Chronic lymphocytic leukemia of B-cell type not having achieved remission: Secondary | ICD-10-CM | POA: Diagnosis not present

## 2019-09-08 DIAGNOSIS — E039 Hypothyroidism, unspecified: Secondary | ICD-10-CM | POA: Diagnosis not present

## 2019-09-08 DIAGNOSIS — R5383 Other fatigue: Secondary | ICD-10-CM | POA: Diagnosis not present

## 2019-09-08 DIAGNOSIS — Z1389 Encounter for screening for other disorder: Secondary | ICD-10-CM | POA: Diagnosis not present

## 2019-09-22 NOTE — Progress Notes (Signed)
HPI: FU palpitations. CTA August 2020 showed no aneurysm or dissection.  There was increase in lymphadenopathy (Pt with known CLL).  Laboratories June 10, 2019 showed free T4 1.08 and TSH 4.25. Echocardiogram April 2021 showed normal LV function, grade 1 diastolic dysfunction and pleural effusion.  Venous Dopplers April 2021 showed no DVT.  Chest CT April 2021 showed marked progression of the adenopathy consistent with lymphoma.  There was note of new small to moderate bilateral pleural effusions. Patient had thoracentesis and cytology consistent with lymphoma. Monitor May 2021 showed sinus with occasional PACs, brief PAT and PVCs. Since last seen she notes some dyspnea on exertion but no orthopnea or PND.  Mild pedal edema.  No chest pain or syncope.  She has periods where she does have palpitations described as her heart fluttering/racing.  Current Outpatient Medications  Medication Sig Dispense Refill  . levothyroxine (SYNTHROID, LEVOTHROID) 25 MCG tablet Take 25 mcg by mouth daily before breakfast.     . prochlorperazine (COMPAZINE) 10 MG tablet Take 1 tablet (10 mg total) by mouth every 6 (six) hours as needed for nausea or vomiting. 30 tablet 0   No current facility-administered medications for this visit.     Past Medical History:  Diagnosis Date  . CLL (chronic lymphocytic leukemia) ([place])   . Hypertension   . Hypothyroid   . MVP (mitral valve prolapse)   . Rheumatoid arthritis [facility])     Past Surgical History:  Procedure Laterality Date  . ABDOMINAL HYSTERECTOMY    . TONSILLECTOMY      Social History   Socioeconomic History  . Marital status: Married    Spouse name: Not on file  . Number of children: 3  . Years of education: Not on file  . Highest education level: Not on file  Occupational History  . Not on file  Tobacco Use  . Smoking status: Never Smoker  . Smokeless tobacco: Never Used  Substance and Sexual Activity  . Alcohol use: Yes    Comment: Rare   . Drug use: Never  . Sexual activity: Not on file  Other Topics Concern  . Not on file  Social History Narrative  . Not on file   Social Determinants of Health   Financial Resource Strain:   . Difficulty of Paying Living Expenses:   Food Insecurity:   . Worried About Charity fundraiser in the Last Year:   . Arboriculturist in the Last Year:   Transportation Needs:   . Film/video editor (Medical):   Marland Kitchen Lack of Transportation (Non-Medical):   Physical Activity:   . Days of Exercise per Week:   . Minutes of Exercise per Session:   Stress:   . Feeling of Stress :   Social Connections:   . Frequency of Communication with Friends and Family:   . Frequency of Social Gatherings with Friends and Family:   . Attends Religious Services:   . Active Member of Clubs or Organizations:   . Attends Archivist Meetings:   Marland Kitchen Marital Status:   Intimate Partner Violence:   . Fear of Current or Ex-Partner:   . Emotionally Abused:   Marland Kitchen Physically Abused:   . Sexually Abused:     Family History  Problem Relation Age of Onset  . Congestive Heart Failure Father     ROS: no fevers or chills, productive cough, hemoptysis, dysphasia, odynophagia, melena, hematochezia, dysuria, hematuria, rash, seizure activity, orthopnea, PND, pedal edema,  claudication. Remaining systems are negative.  Physical Exam: Well-developed well-nourished in no acute distress.  Skin is warm and dry.  HEENT is normal.  Neck is supple.  Chest is clear to auscultation with normal expansion.  Cardiovascular exam is regular rate and rhythm.  Abdominal exam nontender or distended. No masses palpated. Extremities show trace edema. neuro grossly intact  ECG-sinus tachycardia at a rate of 104, no ST changes.  Personally reviewed  A/P  1 palpitations-LV function is normal. Monitor shows sinus with PACs, brief PAT and PVCs.  She would like to avoid daily medication but would like Toprol 25 mg daily as needed  for excessive symptoms.  We have therefore provided a prescription today.  We can make this a running dose in the future if needed.  2 history of mitral valve prolapse-not evident on most recent echocardiogram.  3 recurrent lymphoma-Per oncology.  Kirk Ruths, MD

## 2019-09-23 NOTE — Progress Notes (Signed)
Pharmacist Chemotherapy Monitoring - Follow Up Assessment    I verify that I have reviewed each item in the below checklist:  . Regimen for the patient is scheduled for the appropriate day and plan matches scheduled date. Marland Kitchen Appropriate non-routine labs are ordered dependent on drug ordered. . If applicable, additional medications reviewed and ordered per protocol based on lifetime cumulative doses and/or treatment regimen.   Plan for follow-up and/or issues identified: No . I-vent associated with next due treatment: No . MD and/or nursing notified: No  Nicole Campbell K 09/23/2019 4:09 PM

## 2019-09-24 ENCOUNTER — Ambulatory Visit (INDEPENDENT_AMBULATORY_CARE_PROVIDER_SITE_OTHER): Payer: PPO | Admitting: Cardiology

## 2019-09-24 ENCOUNTER — Other Ambulatory Visit: Payer: Self-pay

## 2019-09-24 ENCOUNTER — Encounter: Payer: Self-pay | Admitting: Cardiology

## 2019-09-24 VITALS — BP 136/79 | HR 104 | Ht 66.5 in | Wt 178.1 lb

## 2019-09-24 DIAGNOSIS — I341 Nonrheumatic mitral (valve) prolapse: Secondary | ICD-10-CM

## 2019-09-24 DIAGNOSIS — R002 Palpitations: Secondary | ICD-10-CM

## 2019-09-24 MED ORDER — METOPROLOL SUCCINATE ER 25 MG PO TB24: 25.0000 mg | ORAL_TABLET | Freq: Every day | ORAL | 6 refills | Status: DC

## 2019-09-24 NOTE — Patient Instructions (Signed)
Medication Instructions:  START METOPROLOL SUCC ER 265 MG ONCE DAILY AS NEEDED FOR PALPITATIONS  *If you need a refill on your cardiac medications before your next appointment, please call your pharmacy*   Lab Work: If you have labs (blood work) drawn today and your tests are completely normal, you will receive your results only by: Marland Kitchen MyChart Message (if you have MyChart) OR . A paper copy in the mail If you have any lab test that is abnormal or we need to change your treatment, we will call you to review the results.  Follow-Up: At [facility], you and your health needs are our priority.  As part of our continuing mission to provide you with exceptional heart care, we have created designated Provider Care Teams.  These Care Teams include your primary Cardiologist (physician) and Advanced Practice Providers (APPs -  Physician Assistants and Nurse Practitioners) who all work together to provide you with the care you need, when you need it.  We recommend signing up for the patient portal called "MyChart".  Sign up information is provided on this After Visit Summary.  MyChart is used to connect with patients for Virtual Visits (Telemedicine).  Patients are able to view lab/test results, encounter notes, upcoming appointments, etc.  Non-urgent messages can be sent to your provider as well.   To learn more about what you can do with MyChart, go to [email or web address].    Your next appointment:   6 month(s)  The format for your next appointment:   In Person  Provider:   Kirk Ruths, MD

## 2019-09-30 ENCOUNTER — Inpatient Hospital Stay (HOSPITAL_BASED_OUTPATIENT_CLINIC_OR_DEPARTMENT_OTHER): Payer: PPO | Admitting: Oncology

## 2019-09-30 ENCOUNTER — Inpatient Hospital Stay: Payer: PPO | Attending: Oncology

## 2019-09-30 ENCOUNTER — Other Ambulatory Visit: Payer: Self-pay

## 2019-09-30 ENCOUNTER — Inpatient Hospital Stay: Payer: PPO

## 2019-09-30 VITALS — BP 134/63 | HR 95 | Temp 97.3°F | Resp 18 | Ht 66.5 in | Wt 178.0 lb

## 2019-09-30 VITALS — BP 139/68 | HR 86 | Temp 97.9°F | Resp 17

## 2019-09-30 DIAGNOSIS — C9110 Chronic lymphocytic leukemia of B-cell type not having achieved remission: Secondary | ICD-10-CM | POA: Diagnosis not present

## 2019-09-30 DIAGNOSIS — Z5112 Encounter for antineoplastic immunotherapy: Secondary | ICD-10-CM | POA: Diagnosis not present

## 2019-09-30 DIAGNOSIS — J90 Pleural effusion, not elsewhere classified: Secondary | ICD-10-CM | POA: Insufficient documentation

## 2019-09-30 DIAGNOSIS — R0602 Shortness of breath: Secondary | ICD-10-CM | POA: Diagnosis not present

## 2019-09-30 DIAGNOSIS — R0789 Other chest pain: Secondary | ICD-10-CM | POA: Diagnosis not present

## 2019-09-30 DIAGNOSIS — Z5111 Encounter for antineoplastic chemotherapy: Secondary | ICD-10-CM | POA: Insufficient documentation

## 2019-09-30 DIAGNOSIS — Z1152 Encounter for screening for COVID-19: Secondary | ICD-10-CM | POA: Insufficient documentation

## 2019-09-30 LAB — CMP (CANCER CENTER ONLY)
ALT: 12 U/L (ref 0–44)
AST: 20 U/L (ref 15–41)
Albumin: 3.4 g/dL — ABNORMAL LOW (ref 3.5–5.0)
Alkaline Phosphatase: 71 U/L (ref 38–126)
Anion gap: 10 (ref 5–15)
BUN: 16 mg/dL (ref 8–23)
CO2: 21 mmol/L — ABNORMAL LOW (ref 22–32)
Calcium: 9 mg/dL (ref 8.9–10.3)
Chloride: 110 mmol/L (ref 98–111)
Creatinine: 0.88 mg/dL (ref 0.44–1.00)
GFR, Est AFR Am: >60 mL/min (ref >60)
GFR, Estimated: >60 mL/min (ref >60)
Glucose, Bld: 94 mg/dL (ref 70–99)
Potassium: 4 mmol/L (ref 3.5–5.1)
Sodium: 141 mmol/L (ref 135–145)
Total Bilirubin: 0.6 mg/dL (ref 0.3–1.2)
Total Protein: 5.6 g/dL — ABNORMAL LOW (ref 6.5–8.1)

## 2019-09-30 LAB — CBC WITH DIFFERENTIAL (CANCER CENTER ONLY)
Abs Immature Granulocytes: 0.01 10*3/uL (ref 0.00–0.07)
Basophils Absolute: 0 10*3/uL (ref 0.0–0.1)
Basophils Relative: 1 %
Eosinophils Absolute: 0.4 10*3/uL (ref 0.0–0.5)
Eosinophils Relative: 10 %
HCT: 40.3 % (ref 36.0–46.0)
Hemoglobin: 13 g/dL (ref 12.0–15.0)
Immature Granulocytes: 0 %
Lymphocytes Relative: 28 %
Lymphs Abs: 1.1 10*3/uL (ref 0.7–4.0)
MCH: 29.1 pg (ref 26.0–34.0)
MCHC: 32.3 g/dL (ref 30.0–36.0)
MCV: 90.2 fL (ref 80.0–100.0)
Monocytes Absolute: 0.4 10*3/uL (ref 0.1–1.0)
Monocytes Relative: 11 %
Neutro Abs: 1.9 10*3/uL (ref 1.7–7.7)
Neutrophils Relative %: 50 %
Platelet Count: 145 10*3/uL — ABNORMAL LOW (ref 150–400)
RBC: 4.47 MIL/uL (ref 3.87–5.11)
RDW: 13.2 % (ref 11.5–15.5)
WBC Count: 3.8 10*3/uL — ABNORMAL LOW (ref 4.0–10.5)
nRBC: 0 % (ref 0.0–0.2)

## 2019-09-30 MED ORDER — DIPHENHYDRAMINE HCL 25 MG PO CAPS
50.0000 mg | ORAL_CAPSULE | Freq: Once | ORAL | Status: AC
  Administered 2019-09-30: 50 mg via ORAL

## 2019-09-30 MED ORDER — PALONOSETRON HCL INJECTION 0.25 MG/5ML
0.2500 mg | Freq: Once | INTRAVENOUS | Status: AC
  Administered 2019-09-30: 0.25 mg via INTRAVENOUS

## 2019-09-30 MED ORDER — SODIUM CHLORIDE 0.9 % IV SOLN
10.0000 mg | Freq: Once | INTRAVENOUS | Status: AC
  Administered 2019-09-30: 10 mg via INTRAVENOUS
  Filled 2019-09-30: qty 10

## 2019-09-30 MED ORDER — PALONOSETRON HCL INJECTION 0.25 MG/5ML
INTRAVENOUS | Status: AC
  Filled 2019-09-30: qty 5

## 2019-09-30 MED ORDER — DIPHENHYDRAMINE HCL 25 MG PO CAPS
ORAL_CAPSULE | ORAL | Status: AC
  Filled 2019-09-30: qty 2

## 2019-09-30 MED ORDER — ACETAMINOPHEN 325 MG PO TABS
650.0000 mg | ORAL_TABLET | Freq: Once | ORAL | Status: AC
  Administered 2019-09-30: 650 mg via ORAL

## 2019-09-30 MED ORDER — SODIUM CHLORIDE 0.9 % IV SOLN
70.0000 mg/m2 | Freq: Once | INTRAVENOUS | Status: AC
  Administered 2019-09-30: 150 mg via INTRAVENOUS
  Filled 2019-09-30: qty 6

## 2019-09-30 MED ORDER — SODIUM CHLORIDE 0.9 % IV SOLN
375.0000 mg/m2 | Freq: Once | INTRAVENOUS | Status: AC
  Administered 2019-09-30: 700 mg via INTRAVENOUS
  Filled 2019-09-30: qty 50

## 2019-09-30 MED ORDER — SODIUM CHLORIDE 0.9 % IV SOLN
Freq: Once | INTRAVENOUS | Status: AC
  Filled 2019-09-30: qty 250

## 2019-09-30 MED ORDER — ACETAMINOPHEN 325 MG PO TABS
ORAL_TABLET | ORAL | Status: AC
  Filled 2019-09-30: qty 2

## 2019-09-30 NOTE — Progress Notes (Signed)
Hematology and Oncology Follow Up Visit  Nicole Campbell [id number] [birth year] 74 y.o. 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   Prior therapy:   She is status post rituximab weekly for 4 weeks completed on August 21, 2018. She is status post thoracentesis completed on August 12, 2019 with positive cytology. Rituximab 375 mg per metered square weekly started on October 13 of 2020 and completed on 03/04/2019.  This will be repeated every 3 months last treatment completed in March 2021.  Current therapy: Bendamustine and rituximab started on Sep 03, 2019.  She is here for cycle 2 of therapy.      Interim History: Ms. Besaw returns today for a return follow-up.  Since her last visit, she completed the first cycle of bendamustine and rituximab without any major complications.  She did report some breakthrough nausea but no vomiting.  He denies any fevers, chills or sweats.  Her lower extremity edema has improved although she still has some dyspnea on exertion.  She does not report any palpitation and has not needed to take Toprol.  He continues to be active and attends to activities of daily living.          .    Medications: Reviewed without changes. Current Outpatient Medications  Medication Sig Dispense Refill  . levothyroxine (SYNTHROID, LEVOTHROID) 25 MCG tablet Take 25 mcg by mouth daily before breakfast.     . metoprolol succinate (TOPROL XL) 25 MG 24 hr tablet Take 1 tablet (25 mg total) by mouth daily. 30 tablet 6  . prochlorperazine (COMPAZINE) 10 MG tablet Take 1 tablet (10 mg total) by mouth every 6 (six) hours as needed for nausea or vomiting. 30 tablet 0   No current facility-administered medications for this visit.     Allergies:  Allergies  Allergen Reactions  . Erythromycin Palpitations         Physical Exam:   Blood pressure 134/63, pulse 95, temperature (!) 97.3 F (36.3 C), temperature source Temporal, resp. rate 18, height 5' 6.5" (1.689 m), weight 178 lb (80.7 kg), SpO2 97 %.      ECOG:1    General appearance: Comfortable appearing without any discomfort Head: Normocephalic without any trauma Oropharynx: Mucous membranes are moist and pink without any thrush or ulcers. Eyes: Pupils are equal and round reactive to light. Lymph nodes: No cervical, supraclavicular, inguinal or axillary lymphadenopathy.   Heart:regular rate and rhythm.  S1 and S2 without leg edema. Lung:  Decreased breath sounds in the right lung base. Abdomin: Soft, nontender, nondistended with good bowel sounds.  No hepatosplenomegaly. Musculoskeletal: No joint deformity or effusion.  Full range of motion noted. Neurological: No deficits noted on motor, sensory and deep tendon reflex exam. Skin: No petechial rash or dryness.  Appeared moist.                 Lab Results: Lab Results  Component Value Date   WBC 3.8 (L) 09/30/2019   HGB 13.0 09/30/2019   HCT 40.3 09/30/2019   MCV 90.2 09/30/2019   PLT 145 (L) 09/30/2019     Chemistry      Component Value Date/Time   NA 140 09/03/2019 0808   K 4.0 09/03/2019 0808   CL 107 09/03/2019 0808   CO2 24 09/03/2019 0808   BUN 14 09/03/2019 0808   CREATININE 0.86 09/03/2019 9741  Component Value Date/Time   CALCIUM 9.3 09/03/2019 0808   ALKPHOS 72 09/03/2019 0808   AST 23 09/03/2019 0808   ALT 12 09/03/2019 0808   BILITOT 0.7 09/03/2019 0808       Impression and Plan:   74 year old woman with:  1.  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  She presented with lymphadenopathy and stage I disease and subsequently developed pleural effusion.  She completed the first cycle of bendamustine and rituximab without any major complications.  Risks and benefits of continuing this therapy were reviewed at this time.  Potential  complication occluding neutropenia, lymphocytopenia and opportunistic infections.  At this time she is agreeable to continue.  I will adjust the dosing of bendamustine given her decrease in her white cell count and borderline neutropenia.  I will repeat imaging studies after cycle 3 of therapy.  Alternative options including ibrutinib would be instituted if she has any complications related to this therapy.   2.  Rheumatoid arthritis: No recent exacerbation at this time.  3.  IV access: She continues to use peripheral veins without any issues.  4.  Pleural effusion: Related to malignancy without any need for thoracentesis at this time.  We will continue to monitor closely and monitor her response to current therapy.  6.  Covid considerations: She is fully vaccinated since March 2021.  However she is currently receiving B-cell depleting therapy with rituximab which could hinder her ability to mount an immune response.  I counseled her about continuing appropriate measures to avoid excessive exposure occluding wearing masks and physical distancing.  7.  Follow-up: In 4 weeks for repeat follow-up.   30  Minutes were dedicated to this encounter.  The time was spent on updating her disease status, reviewing laboratory data, discussing treatment options and future plan of care.   Zola Button, MD 6/1/20219:14 AM

## 2019-09-30 NOTE — Patient Instructions (Signed)
[facility] Discharge Instructions for Patients Receiving Chemotherapy  Today you received the following chemotherapy agents:  Rituxan and Bendeka.  To help prevent nausea and vomiting after your treatment, we encourage you to take your nausea medication as directed.   If you develop nausea and vomiting that is not controlled by your nausea medication, call the clinic.   BELOW ARE SYMPTOMS THAT SHOULD BE REPORTED IMMEDIATELY:  *FEVER GREATER THAN 100.5 F  *CHILLS WITH OR WITHOUT FEVER  NAUSEA AND VOMITING THAT IS NOT CONTROLLED WITH YOUR NAUSEA MEDICATION  *UNUSUAL SHORTNESS OF BREATH  *UNUSUAL BRUISING OR BLEEDING  TENDERNESS IN MOUTH AND THROAT WITH OR WITHOUT PRESENCE OF ULCERS  *URINARY PROBLEMS  *BOWEL PROBLEMS  UNUSUAL RASH Items with * indicate a potential emergency and should be followed up as soon as possible.  Feel free to call the clinic should you have any questions or concerns. The clinic phone number is [phone].  Please show the CHEMO ALERT CARD at check-in to the Emergency Department and triage nurse.   

## 2019-10-01 ENCOUNTER — Inpatient Hospital Stay: Payer: PPO

## 2019-10-01 ENCOUNTER — Other Ambulatory Visit: Payer: Self-pay

## 2019-10-01 ENCOUNTER — Telehealth: Payer: Self-pay | Admitting: Oncology

## 2019-10-01 VITALS — BP 145/63 | HR 105 | Temp 98.6°F | Resp 16

## 2019-10-01 DIAGNOSIS — Z5112 Encounter for antineoplastic immunotherapy: Secondary | ICD-10-CM | POA: Diagnosis not present

## 2019-10-01 DIAGNOSIS — C9110 Chronic lymphocytic leukemia of B-cell type not having achieved remission: Secondary | ICD-10-CM

## 2019-10-01 MED ORDER — SODIUM CHLORIDE 0.9 % IV SOLN
10.0000 mg | Freq: Once | INTRAVENOUS | Status: AC
  Administered 2019-10-01: 10 mg via INTRAVENOUS
  Filled 2019-10-01: qty 10

## 2019-10-01 MED ORDER — SODIUM CHLORIDE 0.9 % IV SOLN
70.0000 mg/m2 | Freq: Once | INTRAVENOUS | Status: AC
  Administered 2019-10-01: 150 mg via INTRAVENOUS
  Filled 2019-10-01: qty 6

## 2019-10-01 MED ORDER — SODIUM CHLORIDE 0.9 % IV SOLN
Freq: Once | INTRAVENOUS | Status: AC
  Filled 2019-10-01: qty 250

## 2019-10-01 NOTE — Progress Notes (Signed)
Per Dr. Alen Blew, okay to receive treatment with elevated pulse of 105.

## 2019-10-01 NOTE — Telephone Encounter (Signed)
Scheduled appt per 6/1 los. 

## 2019-10-01 NOTE — Patient Instructions (Signed)
[facility] Discharge Instructions for Patients Receiving Chemotherapy  Today you received the following chemotherapy agents Bendeka.  To help prevent nausea and vomiting after your treatment, we encourage you to take your nausea medication as directed.   If you develop nausea and vomiting that is not controlled by your nausea medication, call the clinic.   BELOW ARE SYMPTOMS THAT SHOULD BE REPORTED IMMEDIATELY:  *FEVER GREATER THAN 100.5 F  *CHILLS WITH OR WITHOUT FEVER  NAUSEA AND VOMITING THAT IS NOT CONTROLLED WITH YOUR NAUSEA MEDICATION  *UNUSUAL SHORTNESS OF BREATH  *UNUSUAL BRUISING OR BLEEDING  TENDERNESS IN MOUTH AND THROAT WITH OR WITHOUT PRESENCE OF ULCERS  *URINARY PROBLEMS  *BOWEL PROBLEMS  UNUSUAL RASH Items with * indicate a potential emergency and should be followed up as soon as possible.  Feel free to call the clinic should you have any questions or concerns. The clinic phone number is [phone].  Please show the CHEMO ALERT CARD at check-in to the Emergency Department and triage nurse.   

## 2019-10-07 ENCOUNTER — Other Ambulatory Visit: Payer: PPO

## 2019-10-07 ENCOUNTER — Ambulatory Visit: Payer: PPO

## 2019-10-07 ENCOUNTER — Ambulatory Visit: Payer: PPO | Admitting: Oncology

## 2019-10-12 ENCOUNTER — Encounter: Payer: Self-pay | Admitting: Oncology

## 2019-10-13 ENCOUNTER — Encounter: Payer: Self-pay | Admitting: Oncology

## 2019-10-13 ENCOUNTER — Other Ambulatory Visit: Payer: Self-pay

## 2019-10-13 ENCOUNTER — Other Ambulatory Visit: Payer: Self-pay | Admitting: Emergency Medicine

## 2019-10-13 ENCOUNTER — Ambulatory Visit (HOSPITAL_COMMUNITY)
Admission: RE | Admit: 2019-10-13 | Discharge: 2019-10-13 | Disposition: A | Discharge status: Home / Self Care | Payer: PPO | Source: Ambulatory Visit | Attending: Medical | Admitting: Medical

## 2019-10-13 ENCOUNTER — Inpatient Hospital Stay (HOSPITAL_BASED_OUTPATIENT_CLINIC_OR_DEPARTMENT_OTHER): Payer: PPO | Admitting: Medical

## 2019-10-13 VITALS — BP 147/67 | HR 98 | Temp 97.9°F | Resp 18 | Ht 66.0 in | Wt 173.2 lb

## 2019-10-13 DIAGNOSIS — Z5112 Encounter for antineoplastic immunotherapy: Secondary | ICD-10-CM | POA: Diagnosis not present

## 2019-10-13 DIAGNOSIS — C9110 Chronic lymphocytic leukemia of B-cell type not having achieved remission: Secondary | ICD-10-CM | POA: Diagnosis not present

## 2019-10-13 DIAGNOSIS — R0600 Dyspnea, unspecified: Secondary | ICD-10-CM | POA: Insufficient documentation

## 2019-10-13 DIAGNOSIS — R0609 Other forms of dyspnea: Secondary | ICD-10-CM

## 2019-10-13 DIAGNOSIS — R079 Chest pain, unspecified: Secondary | ICD-10-CM | POA: Diagnosis not present

## 2019-10-13 DIAGNOSIS — R0602 Shortness of breath: Secondary | ICD-10-CM

## 2019-10-13 DIAGNOSIS — J90 Pleural effusion, not elsewhere classified: Secondary | ICD-10-CM

## 2019-10-13 DIAGNOSIS — J9811 Atelectasis: Secondary | ICD-10-CM | POA: Diagnosis not present

## 2019-10-13 LAB — SARS CORONAVIRUS 2 (TAT 6-24 HRS): SARS Coronavirus 2: NEGATIVE

## 2019-10-13 NOTE — Telephone Encounter (Signed)
See message below. Called and informed patient of information below.  Patient has been scheduled to be seen in by Sandi Mealy, PA in [facility] at 11am today (10/13/19).

## 2019-10-13 NOTE — Patient Instructions (Signed)

## 2019-10-13 NOTE — Telephone Encounter (Signed)
-----   Message from Wyatt Portela, MD sent at 10/13/2019  8:24 AM EDT ----- Regarding: RE: [place] This is unrelated to chemo but could be related to pleural fluid accumulation.  I would recommend symptom management clinic today or tomorrow to obtain a chest x-ray.  Potentially repeat thoracentesis may be needed if accumulation of fluid is noted. Thanks ----- Message ----- From: Teodoro Spray, RN Sent: 10/13/2019   8:04 AM EDT To: Wyatt Portela, MD Subject: MyChart Message                                Please see Mychart message below. Last rituxan/bendeka treatment on 09/30/19 and 10/01/19. Patient saw Cardiologist on 09/24/19 and was prescribed Metoprolol PRN but has not taken them. Patient states the "tightness/winded feeling comes and goes", but is more prominent upon exertion. Denies chest pain. Please advise.    Hi Dr. Alen Blew, I have an issue that has developed since my infusion on June 1&2. On Friday, June 8, I woke up feeling like a weight was on my chest, and my breathing was short, quick breaths. It continues to be this way, but I have moments when my breathing is better. I also feel some weakness, and I get very winded when I try to exert myself. Is this normal for Chemo, or could something else be going on? Prior to Friday, I was doing ok. My breathing was slower, and I didn't feel as winded. I also don't have a temperature. Thank you for your advice.  Jule Ser

## 2019-10-14 ENCOUNTER — Ambulatory Visit (HOSPITAL_COMMUNITY)
Admission: RE | Admit: 2019-10-14 | Discharge: 2019-10-14 | Disposition: A | Discharge status: Home / Self Care | Payer: PPO | Source: Ambulatory Visit | Attending: Radiology | Admitting: Radiology

## 2019-10-14 ENCOUNTER — Telehealth: Payer: Self-pay

## 2019-10-14 ENCOUNTER — Ambulatory Visit (HOSPITAL_COMMUNITY)
Admission: RE | Admit: 2019-10-14 | Discharge: 2019-10-14 | Disposition: A | Discharge status: Home / Self Care | Payer: PPO | Source: Ambulatory Visit | Attending: Medical | Admitting: Medical

## 2019-10-14 ENCOUNTER — Ambulatory Visit: Payer: PPO

## 2019-10-14 ENCOUNTER — Other Ambulatory Visit: Payer: PPO

## 2019-10-14 DIAGNOSIS — J90 Pleural effusion, not elsewhere classified: Secondary | ICD-10-CM | POA: Diagnosis not present

## 2019-10-14 DIAGNOSIS — C9110 Chronic lymphocytic leukemia of B-cell type not having achieved remission: Secondary | ICD-10-CM | POA: Diagnosis not present

## 2019-10-14 MED ORDER — LIDOCAINE HCL 1 % IJ SOLN
INTRAMUSCULAR | Status: AC
  Filled 2019-10-14: qty 20

## 2019-10-14 NOTE — Telephone Encounter (Signed)
Called patient and made her aware of Dr. Hazeline Junker response. Patient verbalized understanding and will contact the office with any further questions or concerns.

## 2019-10-14 NOTE — Progress Notes (Signed)
Symptoms Management Clinic Progress Note   Nicole Campbell [id number] 11/03/1945 74 y.o.  Nicole Campbell is managed by Dr. Zola Button  Actively treated with chemotherapy/immunotherapy/hormonal therapy: yes  Current therapy: Ruxience and Bendeka  Last treated: 10/01/2019 (cycle 2 , day 2)  Next scheduled appointment with provider:  10/28/2019  Assessment: Plan:    Pleural effusion, bilateral - Plan: US Thoracentesis Asp Pleural space w/IMG guide  CLL (chronic lymphocytic leukemia) ([place]) - Plan: US Thoracentesis Asp Pleural space w/IMG guide  Shortness of breath - Plan: SARS Coronavirus 2 (TAT 6-24 hrs)   Shortness of breath with bilateral pleural effusions: The patient was referred for an ultrasound thoracentesis to be completed tomorrow.  Chronic lymphocytic leukemia: Patient continues to be managed by Dr. Alen Blew and is currently treated with Ruxience and Bendeka.  She is status post cycle 2, day 2 which was dosed on 10/01/2019.  She is scheduled to return for follow-up on 10/28/2019.  Please see After Visit Summary for patient specific instructions.  Future Appointments  Date Time Provider [place]  10/28/2019  8:30 AM [facility] LAB 4 [facility] None  10/28/2019  9:00 AM Wyatt Portela, MD [facility] None  10/28/2019 10:00 AM [facility] INFUSION [facility] None  10/29/2019  9:30 AM [facility] INFUSION [facility] None  11/25/2019  8:00 AM [facility] LAB 3 [facility] None  11/25/2019  8:30 AM Shadad, Mathis Dad, MD [facility] None  11/25/2019  9:00 AM [facility] INFUSION [facility] None  11/26/2019  9:30 AM [facility] INFUSION [facility] None  03/17/2020  3:40 PM Crenshaw, Denice Bors, MD [facility] None    Orders Placed This Encounter  Procedures  . SARS Coronavirus 2 (TAT 6-24 hrs)  . US Thoracentesis Asp Pleural space w/IMG guide       Subjective:   Patient ID:  Nicole Campbell is a 73 y.o. (DOB 1945-07-12) female.  Chief Complaint:    Chief Complaint  Patient presents with  . Shortness of Breath    HPI Nicole Campbell is a 74 y.o. female with a diagnosis of a chronic lymphocytic leukemia. She continues to be managed by Dr. Alen Blew and is currently treated with Ruxience and Bendeka.  She is status post cycle 2, day 2 which was dosed on 10/01/2019.  She has a history of a right pleural effusion which was previously drained.  She presents to the office today with increasing shortness of breath and dyspnea on exertion along with pressure in her chest which has been increasing in severity since last Friday.  She denies chest pain, fevers, chills, cough, nausea, vomiting, or diarrhea.  She was referred for a chest x-ray earlier today which returned showing:  FINDINGS: Heart size is stable.  Moderate pleural effusions are seen bilaterally, increased on the right side since prior study. Bibasilar atelectasis also noted.  No evidence of pulmonary consolidation or edema.  IMPRESSION: 1. Moderate bilateral pleural effusions, increased on the right side since prior study. 2. Bibasilar atelectasis.   Medications: I have reviewed the patient's current medications.  Allergies:  Allergies  Allergen Reactions  . Erythromycin Palpitations    Past Medical History:  Diagnosis Date  . CLL (chronic lymphocytic leukemia) ([place])   . Hypertension   . Hypothyroid   . MVP (mitral valve prolapse)   . Rheumatoid arthritis [facility])     Past Surgical History:  Procedure Laterality Date  . ABDOMINAL HYSTERECTOMY    . TONSILLECTOMY      Family History  Problem Relation Age of Onset  .  Congestive Heart Failure Father     Social History   Socioeconomic History  . Marital status: Married    Spouse name: Not on file  . Number of children: 3  . Years of education: Not on file  . Highest education level: Not on file  Occupational History  . Not on file  Tobacco Use  . Smoking status: Never Smoker  . Smokeless tobacco: Never  Used  Substance and Sexual Activity  . Alcohol use: Yes    Comment: Rare  . Drug use: Never  . Sexual activity: Not on file  Other Topics Concern  . Not on file  Social History Narrative  . Not on file   Social Determinants of Health   Financial Resource Strain:   . Difficulty of Paying Living Expenses:   Food Insecurity:   . Worried About Charity fundraiser in the Last Year:   . Arboriculturist in the Last Year:   Transportation Needs:   . Film/video editor (Medical):   Marland Kitchen Lack of Transportation (Non-Medical):   Physical Activity:   . Days of Exercise per Week:   . Minutes of Exercise per Session:   Stress:   . Feeling of Stress :   Social Connections:   . Frequency of Communication with Friends and Family:   . Frequency of Social Gatherings with Friends and Family:   . Attends Religious Services:   . Active Member of Clubs or Organizations:   . Attends Archivist Meetings:   Marland Kitchen Marital Status:   Intimate Partner Violence:   . Fear of Current or Ex-Partner:   . Emotionally Abused:   Marland Kitchen Physically Abused:   . Sexually Abused:     Past Medical History, Surgical history, Social history, and Family history were reviewed and updated as appropriate.   Please see review of systems for further details on the patient's review from today.   Review of Systems:  Review of Systems  Constitutional: Negative for chills, diaphoresis and fever.  HENT: Negative for trouble swallowing.   Respiratory: Positive for shortness of breath. Negative for cough, choking, chest tightness, wheezing and stridor.   Cardiovascular: Negative for chest pain and palpitations.       Chest pressure    Objective:   Physical Exam:  BP (!) 147/67 (BP Location: Left Arm, Patient Position: Sitting)   Pulse 98   Temp 97.9 F (36.6 C) (Temporal)   Resp 18   Ht 5\' 6"  (1.676 m)   Wt 173 lb 3.2 oz (78.6 kg)   SpO2 96%   BMI 27.96 kg/m  ECOG: 1  Physical Exam Constitutional:       General: She is not in acute distress.    Appearance: She is not diaphoretic.  HENT:     Head: Normocephalic and atraumatic.  Cardiovascular:     Rate and Rhythm: Normal rate and regular rhythm.     Heart sounds: Normal heart sounds. No murmur heard.  No friction rub. No gallop.   Pulmonary:     Effort: Pulmonary effort is normal. No respiratory distress.     Breath sounds: No stridor. Examination of the right-middle field reveals decreased breath sounds. Examination of the left-middle field reveals decreased breath sounds. Examination of the right-lower field reveals decreased breath sounds. Examination of the left-lower field reveals decreased breath sounds. Decreased breath sounds present. No wheezing or rales.  Skin:    General: Skin is warm and dry.     Coloration:  Skin is not pale.     Findings: No erythema.  Neurological:     Mental Status: She is alert.  Psychiatric:        Behavior: Behavior normal.        Thought Content: Thought content normal.        Judgment: Judgment normal.     Lab Review:     Component Value Date/Time   NA 141 09/30/2019 0842   K 4.0 09/30/2019 0842   CL 110 09/30/2019 0842   CO2 21 (L) 09/30/2019 0842   GLUCOSE 94 09/30/2019 0842   BUN 16 09/30/2019 0842   CREATININE 0.88 09/30/2019 0842   CALCIUM 9.0 09/30/2019 0842   PROT 5.6 (L) 09/30/2019 0842   ALBUMIN 3.4 (L) 09/30/2019 0842   AST 20 09/30/2019 0842   ALT 12 09/30/2019 0842   ALKPHOS 71 09/30/2019 0842   BILITOT 0.6 09/30/2019 0842   GFRNONAA >60 09/30/2019 0842   GFRAA >60 09/30/2019 0842       Component Value Date/Time   WBC 3.8 (L) 09/30/2019 0842   WBC 30.1 (H) 12/12/2018 0104   RBC 4.47 09/30/2019 0842   HGB 13.0 09/30/2019 0842   HCT 40.3 09/30/2019 0842   PLT 145 (L) 09/30/2019 0842   MCV 90.2 09/30/2019 0842   MCH 29.1 09/30/2019 0842   MCHC 32.3 09/30/2019 0842   RDW 13.2 09/30/2019 0842   LYMPHSABS 1.1 09/30/2019 0842   MONOABS 0.4 09/30/2019 0842   EOSABS  0.4 09/30/2019 0842   BASOSABS 0.0 09/30/2019 0842   -------------------------------  Imaging from last 24 hours (if applicable):  Radiology interpretation: DG Chest 1 View  Result Date: 10/14/2019 CLINICAL DATA:  Status post right thoracentesis EXAM: CHEST  1 VIEW COMPARISON:  10/13/2019 FINDINGS: Small right pleural effusion, decreased status post thoracentesis. No pneumothorax is seen. Moderate left pleural effusion, unchanged. The heart is normal in size. IMPRESSION: Small right pleural effusion, decreased status post thoracentesis. No pneumothorax is seen. Moderate left pleural effusion, unchanged. Electronically Signed   By: Julian Hy M.D.   On: 10/14/2019 11:53   DG Chest 2 View  Result Date: 10/13/2019 CLINICAL DATA:  Shortness of breath, tachypnea, and chest pain beginning this morning. Chronic lymphocytic leukemia. EXAM: CHEST - 2 VIEW COMPARISON:  04/31/2021 FINDINGS: Heart size is stable. Moderate pleural effusions are seen bilaterally, increased on the right side since prior study. Bibasilar atelectasis also noted. No evidence of pulmonary consolidation or edema. IMPRESSION: 1. Moderate bilateral pleural effusions, increased on the right side since prior study. 2. Bibasilar atelectasis. Electronically Signed   By: Marlaine Hind M.D.   On: 10/13/2019 11:37   US Thoracentesis Asp Pleural space w/IMG guide  Result Date: 10/14/2019 INDICATION: Patient with history of CLL, dyspnea, bilateral pleural effusions. Request made for therapeutic right thoracentesis. EXAM: ULTRASOUND GUIDED THERAPEUTIC RIGHT THORACENTESIS MEDICATIONS: None COMPLICATIONS: None immediate. PROCEDURE: An ultrasound guided thoracentesis was thoroughly discussed with the patient and questions answered. The benefits, risks, alternatives and complications were also discussed. The patient understands and wishes to proceed with the procedure. Written consent was obtained. Ultrasound was performed to localize and mark  an adequate pocket of fluid in the right chest. The area was then prepped and draped in the normal sterile fashion. 1% Lidocaine was used for local anesthesia. Under ultrasound guidance a 6 Fr Safe-T-Centesis catheter was introduced. Thoracentesis was performed. The catheter was removed and a dressing applied. FINDINGS: A total of approximately 1.2 liters of yellow/cream colored fluid was removed.  Due to patient coughing only the above amount of fluid was removed today. IMPRESSION: Successful ultrasound guided therapeutic right thoracentesis yielding 1.2 liters of pleural fluid. Read by: Rowe Robert, PA-C Electronically Signed   By: Jacqulynn Cadet M.D.   On: 10/14/2019 11:51        This patient was seen with Dr. Alen Blew with my treatment plan reviewed with him. He expressed agreement with my medical management of this patient.   Oncology attending addendum:  Patient seen and examined personally.  I appreciate.  Without any major distress overall appeared nontoxic.  On physical examination she did have a decrease of breath sounds at the bases.  Chest x-ray was personally reviewed and discussed with the patient.  Chest x-ray showed bilateral effusion.  I recommended proceeding with thoracentesis for therapeutic purposes which was completed on 10/15/2019 with 1.2 L from the right.  She still on chemotherapy and completed 2 cycles with still accumulation of pleural effusion.  If this issue does not resolve, will consider changing to a different regimen.  30  minutes were dedicated to this visit. The time was spent on imaging studies, discussing treatment options and answering questions regarding future plan.

## 2019-10-14 NOTE — Telephone Encounter (Signed)
-----   Message from Nicole Portela, MD sent at 10/14/2019  8:51 AM EDT ----- She can hydrocortisone cream.  I saw her yesterday and it did not look that there is any serious drug rash.  Thanks ----- Message ----- From: Nicole Lin, RN Sent: 10/14/2019   8:37 AM EDT To: Nicole Portela, MD  Hi Dr Alen Blew, Unfortunately, I've developed another issue since my last infusion.  I have a VERY red, itchy, bumpy rash on my face, that started several days ago.  It's very uncomfortable.  I didn't associate it at first with the chemo, because it's been 2 weeks since my infusion.  Can chemo side affects show up weeks later?  I read a warning for Bendeka to report any severe rash to my Dr.  How serious is this? Nicole Campbell   I called her and she said she forgot to mention this when she was seen in Symptom Management yesterday. She said her cheeks and chin have been bumpy and itchy x 4 days. Per patient her cheek and chin were very red yesterday but are a little better today. She said the rash is slight on her forehead. Last treatment D1 C2 Rituxan/Bendeka was on 09/30/19. Nicole Campbell

## 2019-10-14 NOTE — Procedures (Signed)
Ultrasound-guided therapeutic right thoracentesis performed yielding 1.2 liters of yellow /cream colored fluid. No immediate complications. Follow-up chest x-ray pending. Due to pt coughing only the above amount of fluid was removed today. EBL < 1 cc.

## 2019-10-21 ENCOUNTER — Other Ambulatory Visit: Payer: PPO

## 2019-10-21 ENCOUNTER — Ambulatory Visit: Payer: PPO

## 2019-10-26 IMAGING — US US SOFT TISSUE EXCLUDE HEAD/NECK
1 series · 9 of 9 positions shown · non-contrast
Comparison: Chest CT 03/26/2018.

CLINICAL DATA: 72-year-old with a right ribcage mass for several
years.

EXAM:
CHEST SOFT TISSUE ULTRASOUND

[Series 1: us soft tissue exclude head/neck · 0.07mm/px · 9 acquisitions, 9 frames shown]
[im 1/9]
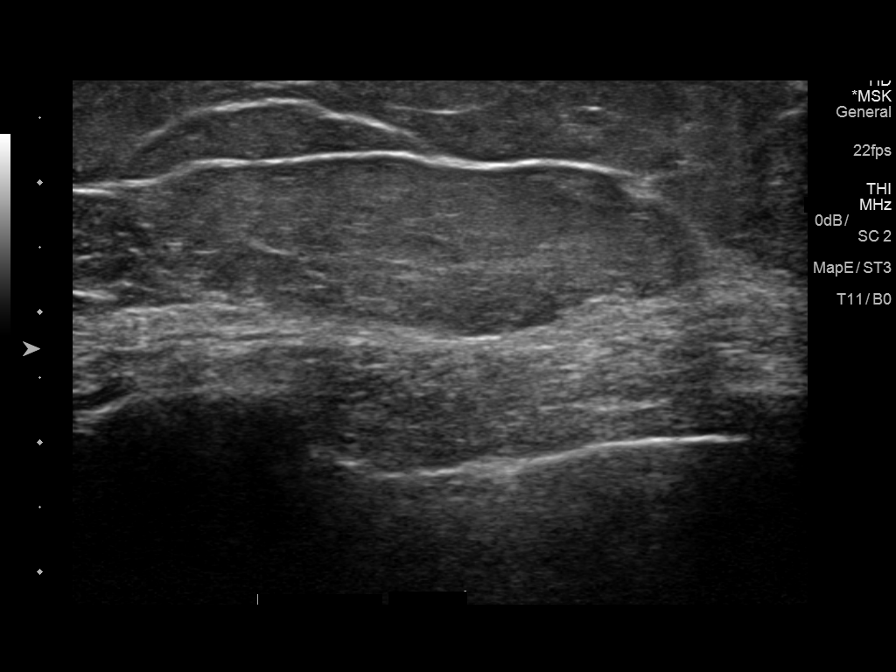
[im 2/9]
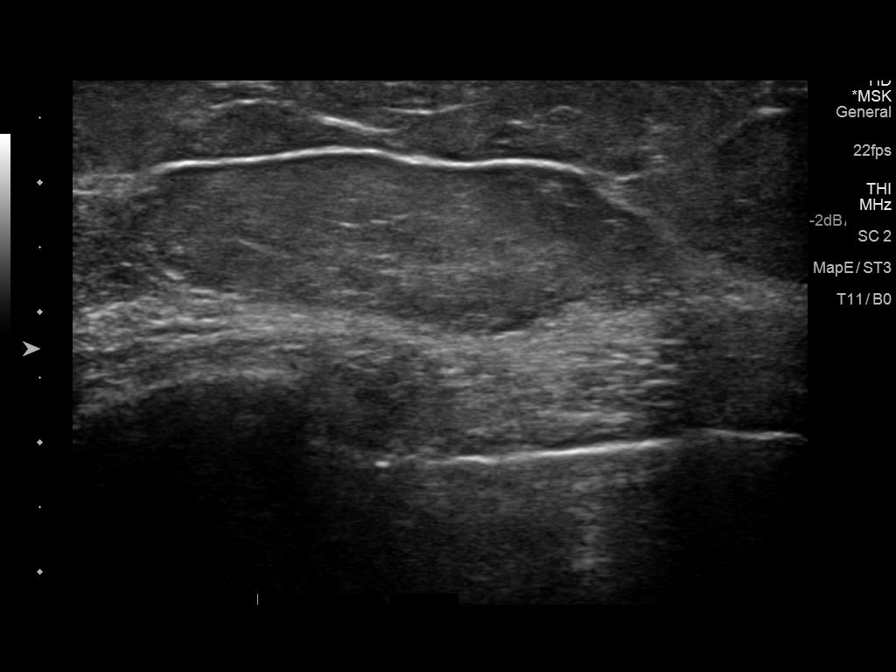
[im 3/9]
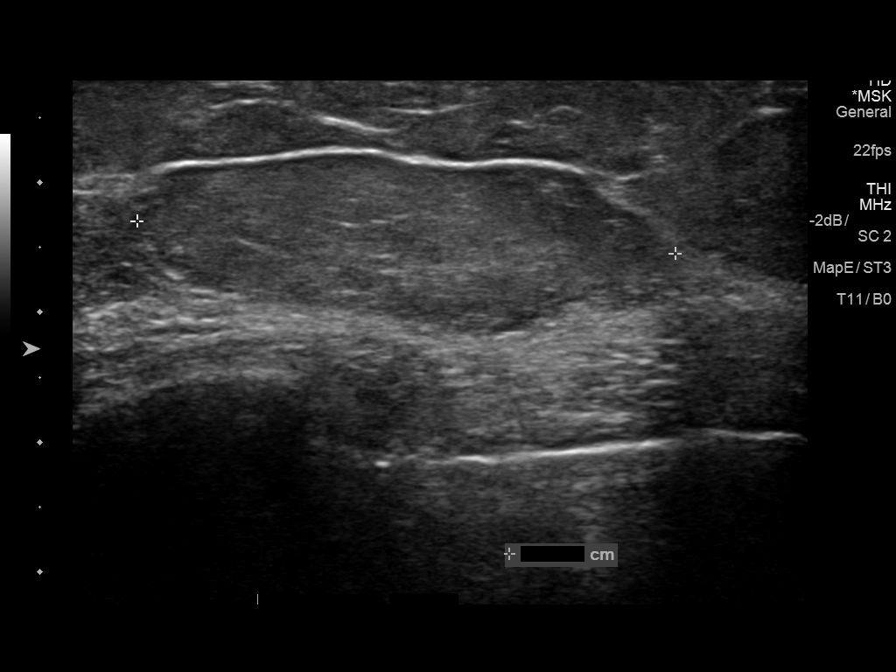
[im 4/9]
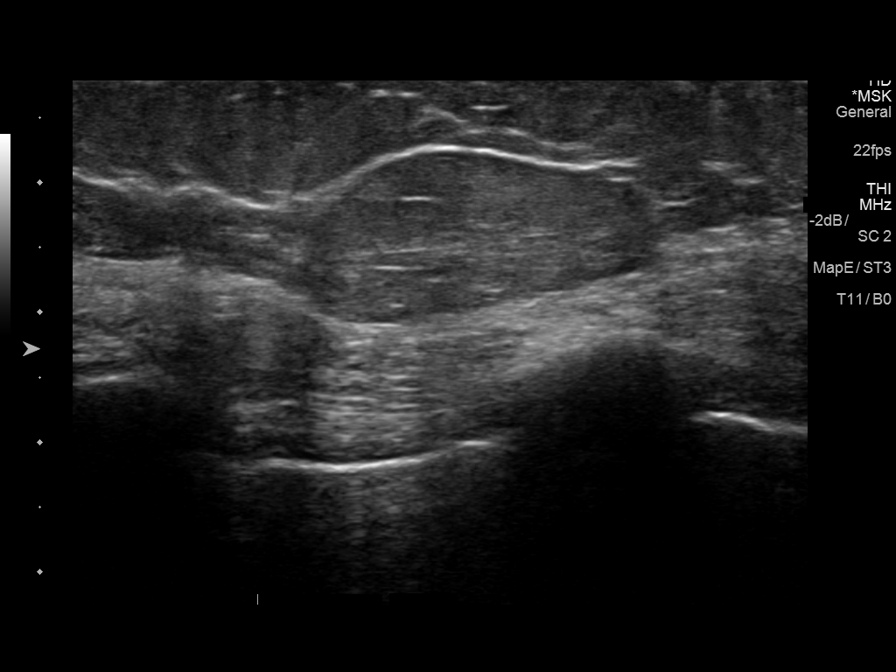
[im 5/9]
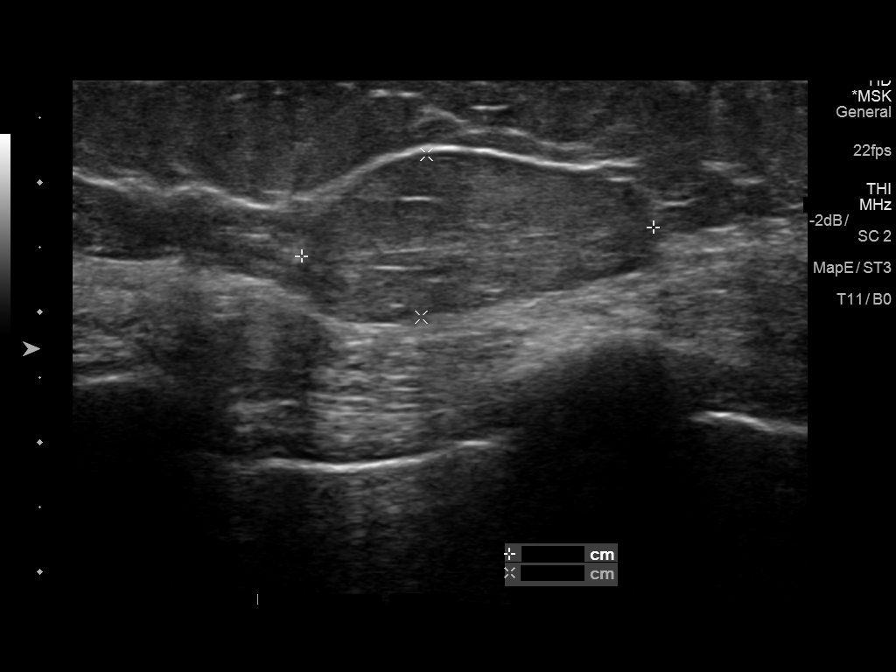
[im 6/9]
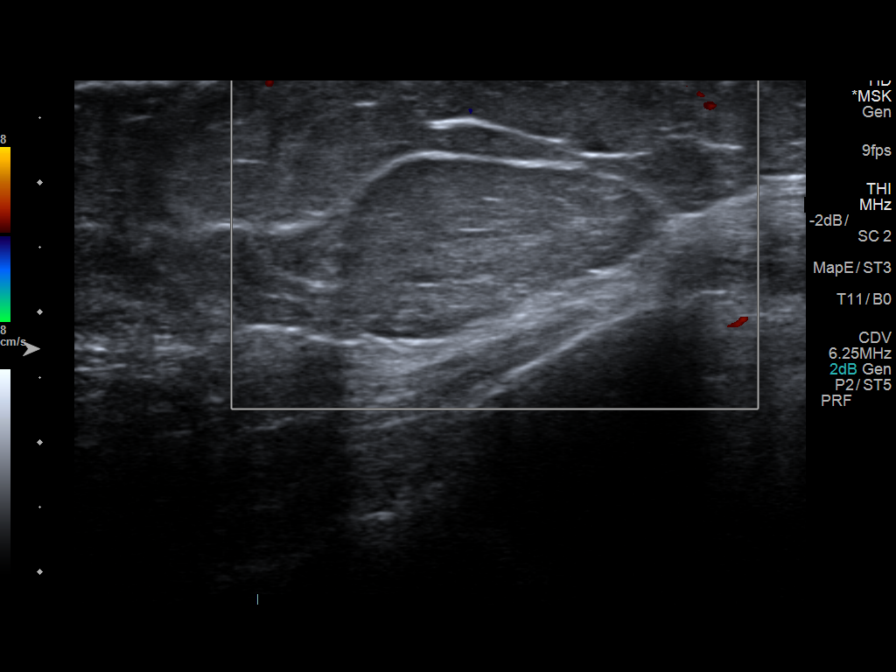
[im 7/9]
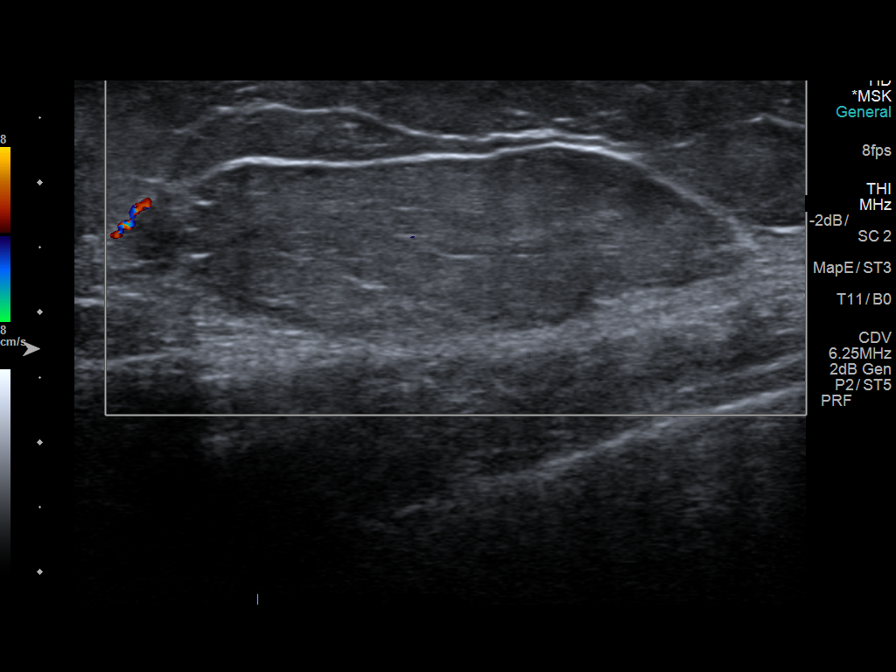
[im 8/9]
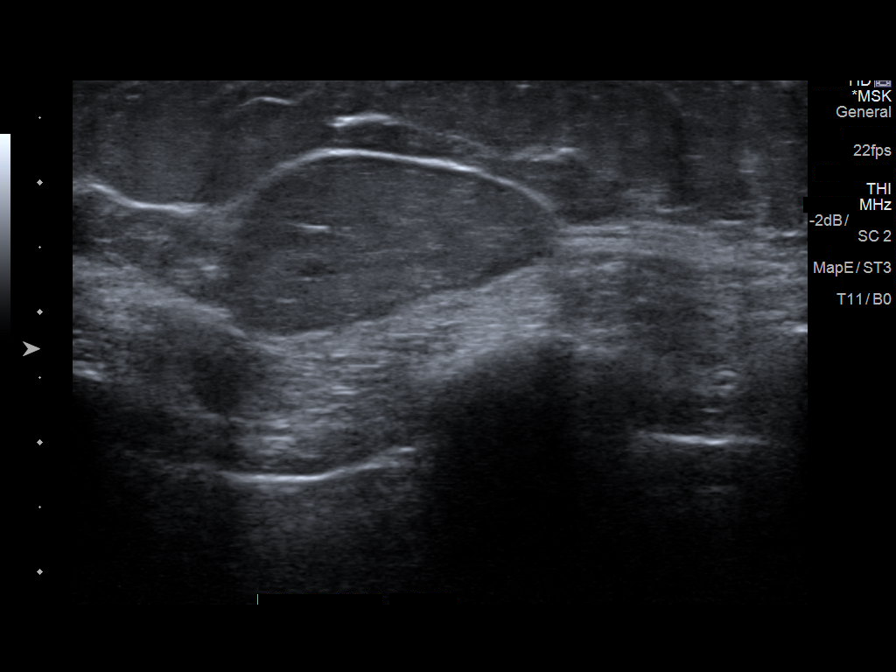
[im 9/9]
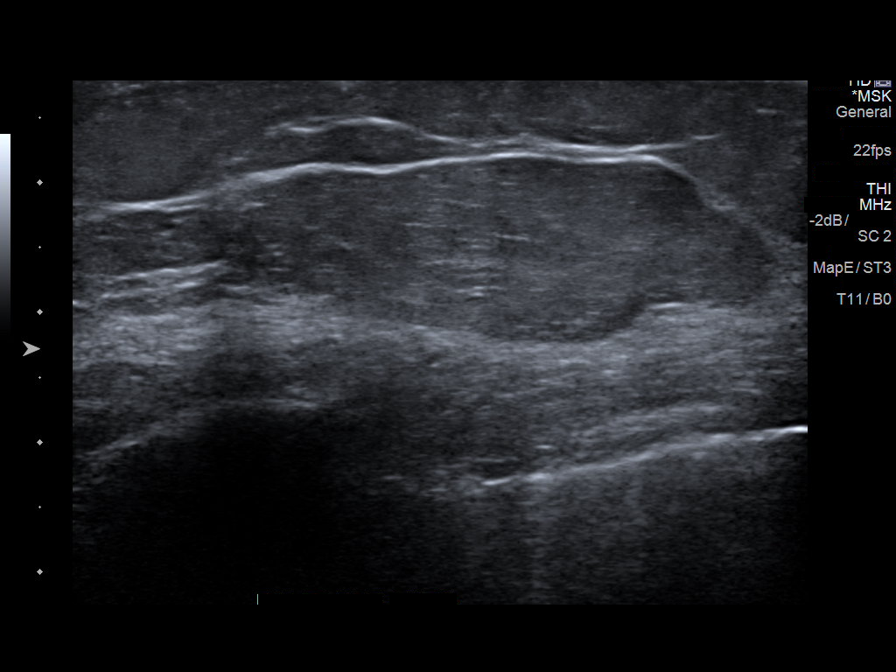

[9 of 9 positions shown; findings below may reference images not displayed]

FINDINGS: Solid structure at the area of concern along the right lateral
ribcage. This structure measures 4.2 x 1.3 x 2.7 cm. Structure is
isoechoic to the surrounding subcutaneous fat. This probably
corresponds with fat attenuating structure in the right lateral
chest on the chest CT from 03/26/2018 on sequence 2, image 55.
IMPRESSION: 4.2 cm structure in the right lateral chest that is has similar
echogenicity to the subcutaneous fat. Findings are most compatible
with a lipoma and may correspond with a fat attenuating structure on
the previous chest CT as described in the findings.

## 2019-10-28 ENCOUNTER — Inpatient Hospital Stay: Payer: PPO

## 2019-10-28 ENCOUNTER — Inpatient Hospital Stay (HOSPITAL_BASED_OUTPATIENT_CLINIC_OR_DEPARTMENT_OTHER): Payer: PPO | Admitting: Oncology

## 2019-10-28 ENCOUNTER — Other Ambulatory Visit: Payer: PPO

## 2019-10-28 ENCOUNTER — Other Ambulatory Visit: Payer: Self-pay

## 2019-10-28 ENCOUNTER — Ambulatory Visit: Payer: PPO

## 2019-10-28 VITALS — BP 144/73 | HR 103 | Temp 97.3°F | Resp 20 | Wt 176.3 lb

## 2019-10-28 VITALS — BP 113/57 | HR 87 | Temp 98.1°F | Resp 17

## 2019-10-28 DIAGNOSIS — Z5112 Encounter for antineoplastic immunotherapy: Secondary | ICD-10-CM | POA: Diagnosis not present

## 2019-10-28 DIAGNOSIS — C9110 Chronic lymphocytic leukemia of B-cell type not having achieved remission: Secondary | ICD-10-CM

## 2019-10-28 LAB — CMP (CANCER CENTER ONLY)
ALT: 15 U/L (ref 0–44)
AST: 25 U/L (ref 15–41)
Albumin: 3.3 g/dL — ABNORMAL LOW (ref 3.5–5.0)
Alkaline Phosphatase: 83 U/L (ref 38–126)
Anion gap: 9 (ref 5–15)
BUN: 14 mg/dL (ref 8–23)
CO2: 22 mmol/L (ref 22–32)
Calcium: 9 mg/dL (ref 8.9–10.3)
Chloride: 109 mmol/L (ref 98–111)
Creatinine: 0.89 mg/dL (ref 0.44–1.00)
GFR, Est AFR Am: >60 mL/min (ref >60)
GFR, Estimated: >60 mL/min (ref >60)
Glucose, Bld: 96 mg/dL (ref 70–99)
Potassium: 4.3 mmol/L (ref 3.5–5.1)
Sodium: 140 mmol/L (ref 135–145)
Total Bilirubin: 0.5 mg/dL (ref 0.3–1.2)
Total Protein: 5.6 g/dL — ABNORMAL LOW (ref 6.5–8.1)

## 2019-10-28 LAB — CBC WITH DIFFERENTIAL (CANCER CENTER ONLY)
Abs Immature Granulocytes: 0.02 10*3/uL (ref 0.00–0.07)
Basophils Absolute: 0.1 10*3/uL (ref 0.0–0.1)
Basophils Relative: 2 %
Eosinophils Absolute: 0.4 10*3/uL (ref 0.0–0.5)
Eosinophils Relative: 13 %
HCT: 38 % (ref 36.0–46.0)
Hemoglobin: 12.5 g/dL (ref 12.0–15.0)
Immature Granulocytes: 1 %
Lymphocytes Relative: 25 %
Lymphs Abs: 0.8 10*3/uL (ref 0.7–4.0)
MCH: 30 pg (ref 26.0–34.0)
MCHC: 32.9 g/dL (ref 30.0–36.0)
MCV: 91.3 fL (ref 80.0–100.0)
Monocytes Absolute: 0.4 10*3/uL (ref 0.1–1.0)
Monocytes Relative: 12 %
Neutro Abs: 1.5 10*3/uL — ABNORMAL LOW (ref 1.7–7.7)
Neutrophils Relative %: 47 %
Platelet Count: 209 10*3/uL (ref 150–400)
RBC: 4.16 MIL/uL (ref 3.87–5.11)
RDW: 15.3 % (ref 11.5–15.5)
WBC Count: 3.1 10*3/uL — ABNORMAL LOW (ref 4.0–10.5)
nRBC: 0 % (ref 0.0–0.2)

## 2019-10-28 MED ORDER — SODIUM CHLORIDE 0.9 % IV SOLN
375.0000 mg/m2 | Freq: Once | INTRAVENOUS | Status: AC
  Administered 2019-10-28: 700 mg via INTRAVENOUS
  Filled 2019-10-28: qty 20

## 2019-10-28 MED ORDER — ACETAMINOPHEN 325 MG PO TABS
ORAL_TABLET | ORAL | Status: AC
  Filled 2019-10-28: qty 2

## 2019-10-28 MED ORDER — SODIUM CHLORIDE 0.9 % IV SOLN
Freq: Once | INTRAVENOUS | Status: AC
  Filled 2019-10-28: qty 250

## 2019-10-28 MED ORDER — ACETAMINOPHEN 325 MG PO TABS
650.0000 mg | ORAL_TABLET | Freq: Once | ORAL | Status: AC
  Administered 2019-10-28: 650 mg via ORAL

## 2019-10-28 MED ORDER — SODIUM CHLORIDE 0.9 % IV SOLN
70.0000 mg/m2 | Freq: Once | INTRAVENOUS | Status: AC
  Administered 2019-10-28: 150 mg via INTRAVENOUS
  Filled 2019-10-28: qty 6

## 2019-10-28 MED ORDER — SODIUM CHLORIDE 0.9 % IV SOLN
10.0000 mg | Freq: Once | INTRAVENOUS | Status: AC
  Administered 2019-10-28: 10 mg via INTRAVENOUS
  Filled 2019-10-28: qty 10

## 2019-10-28 MED ORDER — DIPHENHYDRAMINE HCL 25 MG PO CAPS
50.0000 mg | ORAL_CAPSULE | Freq: Once | ORAL | Status: AC
  Administered 2019-10-28: 50 mg via ORAL

## 2019-10-28 MED ORDER — PALONOSETRON HCL INJECTION 0.25 MG/5ML
INTRAVENOUS | Status: AC
  Filled 2019-10-28: qty 5

## 2019-10-28 MED ORDER — DIPHENHYDRAMINE HCL 25 MG PO CAPS
ORAL_CAPSULE | ORAL | Status: AC
  Filled 2019-10-28: qty 2

## 2019-10-28 MED ORDER — PALONOSETRON HCL INJECTION 0.25 MG/5ML
0.2500 mg | Freq: Once | INTRAVENOUS | Status: AC
  Administered 2019-10-28: 0.25 mg via INTRAVENOUS

## 2019-10-28 NOTE — Patient Instructions (Signed)
[facility] Discharge Instructions for Patients Receiving Chemotherapy  Today you received the following chemotherapy agents:  Rituxan and Bendeka.  To help prevent nausea and vomiting after your treatment, we encourage you to take your nausea medication as directed.   If you develop nausea and vomiting that is not controlled by your nausea medication, call the clinic.   BELOW ARE SYMPTOMS THAT SHOULD BE REPORTED IMMEDIATELY:  *FEVER GREATER THAN 100.5 F  *CHILLS WITH OR WITHOUT FEVER  NAUSEA AND VOMITING THAT IS NOT CONTROLLED WITH YOUR NAUSEA MEDICATION  *UNUSUAL SHORTNESS OF BREATH  *UNUSUAL BRUISING OR BLEEDING  TENDERNESS IN MOUTH AND THROAT WITH OR WITHOUT PRESENCE OF ULCERS  *URINARY PROBLEMS  *BOWEL PROBLEMS  UNUSUAL RASH Items with * indicate a potential emergency and should be followed up as soon as possible.  Feel free to call the clinic should you have any questions or concerns. The clinic phone number is [phone].  Please show the CHEMO ALERT CARD at check-in to the Emergency Department and triage nurse.   

## 2019-10-28 NOTE — Progress Notes (Signed)
Hematology and Oncology Follow Up Visit  Nicole Campbell [id number] 1945-08-19 74 y.o. 10/28/2019 9:08 AM Nicole Campbell, MDSmith, [name], MD   Principle Diagnosis: 74 year old woman with CLL diagnosed in November 2019 after presenting with lymphocytosis and adenopathy.  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 She is status post thoracentesis completed on August 12, 2019 with positive cytology. Rituximab 375 mg per metered square weekly started on October 13 of 2020 and completed on 03/04/2019.  This will be repeated every 3 months last treatment completed in March 2021.  Current therapy: Bendamustine and rituximab started on Sep 03, 2019.  She is here for cycle 3 of therapy.      Interim History: [name] is here for return evaluation.  Since last visit, she continues to tolerate chemotherapy without any major complications.  She does report some mild erythema on her face and lower extremity bendamustine.  He denies any nausea vomiting or abdominal pain.  She did report a recurrent pleural effusion that required thoracentesis on 10/14/2019.  Denies any fevers or chills or recurrent infections.  She denies any hospitalization or illnesses.          .    Medications: Updated on review. Current Outpatient Medications  Medication Sig Dispense Refill  . levothyroxine (SYNTHROID, LEVOTHROID) 25 MCG tablet Take 25 mcg by mouth daily before breakfast.     . metoprolol succinate (TOPROL XL) 25 MG 24 hr tablet Take 1 tablet (25 mg total) by mouth daily. (Patient taking differently: Take 25 mg by mouth daily. As needed) 30 tablet 6  . prochlorperazine (COMPAZINE) 10 MG tablet Take 1 tablet (10 mg total) by mouth every 6 (six) hours as needed for nausea or vomiting. 30 tablet 0   No current facility-administered medications for this visit.     Allergies:  Allergies   Allergen Reactions  . Erythromycin Palpitations        Physical Exam:   Blood pressure (!) 144/73, pulse (!) 103, temperature (!) 97.3 F (36.3 C), temperature source Temporal, resp. rate 20, weight 176 lb 4.8 oz (80 kg), SpO2 96 %.      ECOG:1     General appearance: Alert, awake without any distress. Head: Atraumatic without abnormalities Oropharynx: Without any thrush or ulcers. Eyes: No scleral icterus. Lymph nodes: No lymphadenopathy noted in the cervical, supraclavicular, or axillary nodes Heart:regular rate and rhythm, without any murmurs or gallops.   Lung: Clear to auscultation without any rhonchi, wheezes or dullness to percussion. Abdomin: Soft, nontender without any shifting dullness or ascites. Musculoskeletal: No clubbing or cyanosis. Neurological: No motor or sensory deficits. Skin: No rashes or lesions.                 Lab Results: Lab Results  Component Value Date   WBC 3.1 (L) 10/28/2019   HGB 12.5 10/28/2019   HCT 38.0 10/28/2019   MCV 91.3 10/28/2019   PLT 209 10/28/2019     Chemistry      Component Value Date/Time   NA 141 09/30/2019 0842   K 4.0 09/30/2019 0842   CL 110 09/30/2019 0842   CO2 21 (L) 09/30/2019 0842   BUN 16 09/30/2019 0842   CREATININE 0.88 09/30/2019 0842      Component Value Date/Time   CALCIUM 9.0 09/30/2019 0842   ALKPHOS 71 09/30/2019 0842  AST 20 09/30/2019 0842   ALT 12 09/30/2019 0842   BILITOT 0.6 09/30/2019 0842       Impression and Plan:   74 year old woman with:  1.  CLL presented with lymphocytosis and adenopathy in November 2019.  She was found to have p53 and ATM mutation.    She is status post 2 cycles of chemotherapy with bendamustine and rituximab with reasonable tolerance.  Risks and benefits of continuing this treatment versus switching to ibrutinib as a salvage option.  The choice of chemotherapy was preferred given her pleural effusion but if no improvement is noted  after the next cycle we will switch her to ibrutinib at this time.  We will repeat imaging studies after the current cycle.   2.  Rheumatoid arthritis: No issues or exacerbation noted.  3.  IV access: Peripheral veins are currently in use without any issues.  4.  Pleural effusion: She had recurrent thoracentesis needed on 10/14/2019.  We will repeat imaging studies after the next cycle of therapy.  No fluid reaccumulation noted at this time  6.  Covid considerations: She has completed her vaccination series although she remains at high risk given her exposure to rituximab, CLL and possible compromise of her humoral immunity.  7.  Follow-up: She will return for the next cycle of therapy in 4 weeks.   30  Minutes were spent on this visit.  The time was dedicated to updating her disease status, discussing treatment options and addressing complication related future plan of care   Zola Button, MD 6/29/20219:08 AM

## 2019-10-29 ENCOUNTER — Other Ambulatory Visit: Payer: Self-pay

## 2019-10-29 ENCOUNTER — Inpatient Hospital Stay: Payer: PPO

## 2019-10-29 VITALS — BP 113/76 | HR 94 | Temp 98.4°F | Resp 17

## 2019-10-29 DIAGNOSIS — C9110 Chronic lymphocytic leukemia of B-cell type not having achieved remission: Secondary | ICD-10-CM

## 2019-10-29 DIAGNOSIS — Z5112 Encounter for antineoplastic immunotherapy: Secondary | ICD-10-CM | POA: Diagnosis not present

## 2019-10-29 MED ORDER — SODIUM CHLORIDE 0.9 % IV SOLN
10.0000 mg | Freq: Once | INTRAVENOUS | Status: AC
  Administered 2019-10-29: 10 mg via INTRAVENOUS
  Filled 2019-10-29: qty 10

## 2019-10-29 MED ORDER — SODIUM CHLORIDE 0.9 % IV SOLN
Freq: Once | INTRAVENOUS | Status: AC
  Filled 2019-10-29: qty 250

## 2019-10-29 MED ORDER — SODIUM CHLORIDE 0.9 % IV SOLN
70.0000 mg/m2 | Freq: Once | INTRAVENOUS | Status: AC
  Administered 2019-10-29: 150 mg via INTRAVENOUS
  Filled 2019-10-29: qty 6

## 2019-10-29 NOTE — Progress Notes (Signed)
Patient has generalized rash and staets that Dr. Alen Blew is aware but the rash has gotten worse and she wants him to be aware. Made Dr. Alen Blew aware and he recommended that the patient use an OTC hydrocortisone cream - topical. Contacted patient and left message with above recommendation.

## 2019-11-04 ENCOUNTER — Encounter: Payer: Self-pay | Admitting: Oncology

## 2019-11-05 ENCOUNTER — Other Ambulatory Visit: Payer: Self-pay | Admitting: Family Medicine

## 2019-11-05 DIAGNOSIS — Z1231 Encounter for screening mammogram for malignant neoplasm of breast: Secondary | ICD-10-CM

## 2019-11-06 ENCOUNTER — Other Ambulatory Visit: Payer: Self-pay

## 2019-11-06 ENCOUNTER — Ambulatory Visit (INDEPENDENT_AMBULATORY_CARE_PROVIDER_SITE_OTHER): Payer: PPO

## 2019-11-06 DIAGNOSIS — Z1231 Encounter for screening mammogram for malignant neoplasm of breast: Secondary | ICD-10-CM | POA: Diagnosis not present

## 2019-11-07 DIAGNOSIS — T148XXA Other injury of unspecified body region, initial encounter: Secondary | ICD-10-CM | POA: Diagnosis not present

## 2019-11-10 ENCOUNTER — Encounter: Payer: Self-pay | Admitting: Oncology

## 2019-11-13 ENCOUNTER — Ambulatory Visit (HOSPITAL_COMMUNITY)
Admission: RE | Admit: 2019-11-13 | Discharge: 2019-11-13 | Disposition: A | Discharge status: Home / Self Care | Payer: PPO | Source: Ambulatory Visit | Attending: Oncology | Admitting: Oncology

## 2019-11-13 ENCOUNTER — Other Ambulatory Visit: Payer: Self-pay

## 2019-11-13 DIAGNOSIS — I700 Atherosclerosis of aorta: Secondary | ICD-10-CM | POA: Diagnosis not present

## 2019-11-13 DIAGNOSIS — M47816 Spondylosis without myelopathy or radiculopathy, lumbar region: Secondary | ICD-10-CM | POA: Diagnosis not present

## 2019-11-13 DIAGNOSIS — C9110 Chronic lymphocytic leukemia of B-cell type not having achieved remission: Secondary | ICD-10-CM | POA: Diagnosis not present

## 2019-11-13 DIAGNOSIS — M47814 Spondylosis without myelopathy or radiculopathy, thoracic region: Secondary | ICD-10-CM | POA: Diagnosis not present

## 2019-11-13 DIAGNOSIS — R590 Localized enlarged lymph nodes: Secondary | ICD-10-CM | POA: Diagnosis not present

## 2019-11-13 MED ORDER — SODIUM CHLORIDE (PF) 0.9 % IJ SOLN
INTRAMUSCULAR | Status: AC
  Filled 2019-11-13: qty 50

## 2019-11-13 MED ORDER — IOHEXOL 300 MG/ML  SOLN
100.0000 mL | Freq: Once | INTRAMUSCULAR | Status: AC | PRN
  Administered 2019-11-13: 100 mL via INTRAVENOUS

## 2019-11-18 ENCOUNTER — Telehealth: Payer: Self-pay

## 2019-11-18 ENCOUNTER — Encounter: Payer: Self-pay | Admitting: Oncology

## 2019-11-18 ENCOUNTER — Other Ambulatory Visit (HOSPITAL_COMMUNITY)
Admission: RE | Admit: 2019-11-18 | Discharge: 2019-11-18 | Disposition: A | Discharge status: Home / Self Care | Payer: PPO | Source: Ambulatory Visit | Attending: Oncology | Admitting: Oncology

## 2019-11-18 ENCOUNTER — Telehealth: Payer: Self-pay | Admitting: Pharmacist

## 2019-11-18 ENCOUNTER — Other Ambulatory Visit: Payer: Self-pay | Admitting: Oncology

## 2019-11-18 ENCOUNTER — Other Ambulatory Visit: Payer: Self-pay

## 2019-11-18 DIAGNOSIS — J90 Pleural effusion, not elsewhere classified: Secondary | ICD-10-CM

## 2019-11-18 DIAGNOSIS — Z01812 Encounter for preprocedural laboratory examination: Secondary | ICD-10-CM | POA: Insufficient documentation

## 2019-11-18 DIAGNOSIS — Z20822 Contact with and (suspected) exposure to covid-19: Secondary | ICD-10-CM | POA: Insufficient documentation

## 2019-11-18 LAB — SARS CORONAVIRUS 2 (TAT 6-24 HRS): SARS Coronavirus 2: NEGATIVE

## 2019-11-18 MED ORDER — IBRUTINIB 420 MG PO TABS: 420.0000 mg | ORAL_TABLET | Freq: Every day | ORAL | 0 refills | Status: DC

## 2019-11-18 NOTE — Telephone Encounter (Signed)
Oral Oncology Patient Advocate Encounter  Prior Authorization for [name] has been approved.    PA# [id number] Effective dates: 11/18/19 through 11/17/20  Patients co-pay is $2938.58  Oral Oncology Clinic will continue to follow.   [place] Patient Nicole Campbell Phone [phone] Fax [phone] 11/18/2019 2:16 PM

## 2019-11-18 NOTE — Progress Notes (Signed)
The results of the CT scan were reviewed and shows less than robust response at this time.  I recommended switching her treatment to ibrutinib instead of bendamustine and rituximab.  Risks and benefits associated with this treatment were reviewed today.  These include cardiac complications, bruising and arrhythmia.  Rituximab will be continued for maintenance purposes for her rheumatoid arthritis.

## 2019-11-18 NOTE — Telephone Encounter (Signed)
Oral Oncology Patient Advocate Encounter  Received notification from [employer] that prior authorization for Imbruvica is required.  PA submitted on CoverMyMeds Key [id number] Status is pending  Oral Oncology Clinic will continue to follow.  [place] Patient Nicole Campbell Phone [phone] Fax [phone] 11/18/2019 1:40 PM

## 2019-11-18 NOTE — Telephone Encounter (Signed)
Oral Oncology Pharmacist Encounter  Received new prescription for Imbruvica (ibrutinib) for the treatment of CLL with p53 and ATM deletion, planned duration until disease progression or unacceptable drug toxicity. Note rituximab is being administered for rheumatoid arthritis.   Labs from 6/29 assessed, all wnl except WBC count of 3.1. Prescription dose and frequency assessed.   Current medication list in Epic reviewed, no DDIs with Imbruvica identified. Note that patient has a prescription for metoprolol for prn use for palpitations per a cardiology visit 09/24/19.  Prescription has been e-scribed to the [facility] for benefits analysis and approval.  Oral Oncology Clinic will continue to follow for insurance authorization, copayment issues, initial counseling and start date.  Eddie Candle, PharmD PGY2 Hematology/Oncology Pharmacy Resident Oral Chemotherapy Navigation Clinic 11/18/2019 2:12 PM

## 2019-11-18 NOTE — Telephone Encounter (Signed)
Per Dr. Alen Blew, patient scheduled for a thoracentesis this week. Patient scheduled for thoracentesis on Friday 11/21/19 at 10:00 at [facility]. Patient to get COVID test at 2:25 today at [facility]. Patient is aware of all appointments and verbalized understanding.

## 2019-11-18 NOTE — Telephone Encounter (Signed)
Oral Oncology Patient Advocate Encounter  Was successful in securing patient a $8000 grant from [employer] to provide copayment coverage for Imbruvica.  This will keep the out of pocket expense at $0.     [employer] ID: [id number]  I have spoken with the patient.   The billing information is as follows and has been shared with [place].    RxBin: [id number] PCN: [id number] Member ID: [id number] Group ID: [id number] Dates of Eligibility: 10/19/19 through 10/17/20 Fund:  [place] Patient [place] Phone [phone] Fax [phone] 11/18/2019 2:29 PM

## 2019-11-20 ENCOUNTER — Ambulatory Visit (HOSPITAL_COMMUNITY): Payer: PPO

## 2019-11-20 ENCOUNTER — Encounter: Payer: Self-pay | Admitting: Oncology

## 2019-11-20 ENCOUNTER — Telehealth: Payer: Self-pay

## 2019-11-20 MED FILL — IMBRUVICA 420 MG TAB: 420 | 28 days supply | Qty: 28 | Fill #0

## 2019-11-20 NOTE — Telephone Encounter (Signed)
Oral Chemotherapy Pharmacist Encounter  Patient Education I spoke with patient for overview of new oral chemotherapy medication: Imbruvica (ibrutinib) for the treatment of CLL with p53 and ATM deletion, planned duration until disease progression or unacceptable drug toxicity.  Pt is doing well. Counseled patient on administration, dosing, side effects, monitoring, drug-food interactions, safe handling, storage, and disposal.  Patient will take Take 420 mg by mouth daily, with or without food.  Side effects include but not limited to: myelosuppression, diarrhea, edema, N/V, arthralgias, and tachycardia/palpitations noted with risk of atrial fibrillation.   Reviewed with patient importance of keeping a medication schedule and plan for any missed doses.  Ms. Scharfenberg voiced understanding and appreciation. All questions answered.  Provided patient with [facility] phone number. Patient knows to call the office with questions or concerns. Oral Chemotherapy Navigation Clinic will continue to follow.  Eddie Candle, PharmD PGY2 Hematology/Oncology Pharmacy Resident Oral Chemotherapy Navigation Clinic 11/20/2019 11:24 AM

## 2019-11-20 NOTE — Telephone Encounter (Signed)
Appointment on 7/28 canceled per Dr. Alen Blew.

## 2019-11-20 NOTE — Telephone Encounter (Signed)
-----   Message from Wyatt Portela, MD sent at 11/19/2019  4:24 PM EDT ----- Regarding: RE: Cancel appt 7/28 Yes. Rituxan on day one will remain however. Thanks ----- Message ----- From: Tora Kindred, [facility] Sent: 11/19/2019   4:19 PM EDT To: Tami Lin, RN, Wyatt Portela, MD Subject: Cancel appt 7/28                               Ms Morgenthaler is not on [place] anymore; switched to [place]. Should her Day 2 (11/26/19) infusion appt be cancelled?  Thanks, [employer]

## 2019-11-21 ENCOUNTER — Other Ambulatory Visit (HOSPITAL_COMMUNITY): Payer: Self-pay | Admitting: Radiology

## 2019-11-21 ENCOUNTER — Ambulatory Visit (HOSPITAL_COMMUNITY)
Admission: RE | Admit: 2019-11-21 | Discharge: 2019-11-21 | Disposition: A | Discharge status: Home / Self Care | Payer: PPO | Source: Ambulatory Visit | Attending: Radiology | Admitting: Radiology

## 2019-11-21 ENCOUNTER — Ambulatory Visit (HOSPITAL_COMMUNITY)
Admission: RE | Admit: 2019-11-21 | Discharge: 2019-11-21 | Disposition: A | Discharge status: Home / Self Care | Payer: PPO | Source: Ambulatory Visit | Attending: Oncology | Admitting: Oncology

## 2019-11-21 ENCOUNTER — Other Ambulatory Visit: Payer: Self-pay

## 2019-11-21 DIAGNOSIS — J90 Pleural effusion, not elsewhere classified: Secondary | ICD-10-CM | POA: Diagnosis not present

## 2019-11-21 DIAGNOSIS — Z9889 Other specified postprocedural states: Secondary | ICD-10-CM

## 2019-11-21 DIAGNOSIS — J9811 Atelectasis: Secondary | ICD-10-CM | POA: Diagnosis not present

## 2019-11-21 HISTORY — PX: IR THORACENTESIS RIGHT ASP PLEURAL SPACE W/IMG GUIDE: IMG5380

## 2019-11-21 MED ORDER — LIDOCAINE HCL 1 % IJ SOLN
INTRAMUSCULAR | Status: DC | PRN
  Administered 2019-11-21: 10 mL

## 2019-11-21 MED ORDER — LIDOCAINE HCL 1 % IJ SOLN
INTRAMUSCULAR | Status: AC
  Filled 2019-11-21: qty 20

## 2019-11-21 NOTE — Procedures (Signed)
PROCEDURE SUMMARY:  Successful US guided right thoracentesis. Yielded 1.4 L of cloudy yellow fluid. Pt tolerated procedure well. No immediate complications.  Specimen was not sent for labs. CXR ordered.  EBL < 5 mL  Ascencion Dike PA-C 11/21/2019 10:21 AM

## 2019-11-25 ENCOUNTER — Other Ambulatory Visit: Payer: Self-pay

## 2019-11-25 ENCOUNTER — Inpatient Hospital Stay: Payer: PPO

## 2019-11-25 ENCOUNTER — Telehealth: Payer: Self-pay

## 2019-11-25 ENCOUNTER — Inpatient Hospital Stay (HOSPITAL_BASED_OUTPATIENT_CLINIC_OR_DEPARTMENT_OTHER): Payer: PPO | Admitting: Oncology

## 2019-11-25 ENCOUNTER — Inpatient Hospital Stay: Payer: PPO | Attending: Oncology

## 2019-11-25 VITALS — BP 145/55 | HR 98 | Temp 97.3°F | Resp 18 | Wt 167.5 lb

## 2019-11-25 VITALS — BP 114/61 | HR 85 | Temp 98.2°F | Resp 17

## 2019-11-25 DIAGNOSIS — M069 Rheumatoid arthritis, unspecified: Secondary | ICD-10-CM | POA: Insufficient documentation

## 2019-11-25 DIAGNOSIS — Z5112 Encounter for antineoplastic immunotherapy: Secondary | ICD-10-CM | POA: Diagnosis not present

## 2019-11-25 DIAGNOSIS — C9110 Chronic lymphocytic leukemia of B-cell type not having achieved remission: Secondary | ICD-10-CM | POA: Diagnosis not present

## 2019-11-25 LAB — CBC WITH DIFFERENTIAL (CANCER CENTER ONLY)
Abs Immature Granulocytes: 0.01 10*3/uL (ref 0.00–0.07)
Basophils Absolute: 0 10*3/uL (ref 0.0–0.1)
Basophils Relative: 1 %
Eosinophils Absolute: 0.3 10*3/uL (ref 0.0–0.5)
Eosinophils Relative: 7 %
HCT: 33.9 % — ABNORMAL LOW (ref 36.0–46.0)
Hemoglobin: 11.1 g/dL — ABNORMAL LOW (ref 12.0–15.0)
Immature Granulocytes: 0 %
Lymphocytes Relative: 41 %
Lymphs Abs: 1.9 10*3/uL (ref 0.7–4.0)
MCH: 30.3 pg (ref 26.0–34.0)
MCHC: 32.7 g/dL (ref 30.0–36.0)
MCV: 92.6 fL (ref 80.0–100.0)
Monocytes Absolute: 0.5 10*3/uL (ref 0.1–1.0)
Monocytes Relative: 11 %
Neutro Abs: 1.9 10*3/uL (ref 1.7–7.7)
Neutrophils Relative %: 40 %
Platelet Count: 132 10*3/uL — ABNORMAL LOW (ref 150–400)
RBC: 3.66 MIL/uL — ABNORMAL LOW (ref 3.87–5.11)
RDW: 15.6 % — ABNORMAL HIGH (ref 11.5–15.5)
WBC Count: 4.6 10*3/uL (ref 4.0–10.5)
nRBC: 0 % (ref 0.0–0.2)

## 2019-11-25 LAB — CMP (CANCER CENTER ONLY)
ALT: 10 U/L (ref 0–44)
AST: 16 U/L (ref 15–41)
Albumin: 3.2 g/dL — ABNORMAL LOW (ref 3.5–5.0)
Alkaline Phosphatase: 74 U/L (ref 38–126)
Anion gap: 9 (ref 5–15)
BUN: 13 mg/dL (ref 8–23)
CO2: 25 mmol/L (ref 22–32)
Calcium: 9.3 mg/dL (ref 8.9–10.3)
Chloride: 105 mmol/L (ref 98–111)
Creatinine: 0.83 mg/dL (ref 0.44–1.00)
GFR, Est AFR Am: >60 mL/min (ref >60)
GFR, Estimated: >60 mL/min (ref >60)
Glucose, Bld: 105 mg/dL — ABNORMAL HIGH (ref 70–99)
Potassium: 3.8 mmol/L (ref 3.5–5.1)
Sodium: 139 mmol/L (ref 135–145)
Total Bilirubin: 0.9 mg/dL (ref 0.3–1.2)
Total Protein: 5.4 g/dL — ABNORMAL LOW (ref 6.5–8.1)

## 2019-11-25 MED ORDER — DIPHENHYDRAMINE HCL 25 MG PO CAPS
50.0000 mg | ORAL_CAPSULE | Freq: Once | ORAL | Status: AC
  Administered 2019-11-25: 50 mg via ORAL

## 2019-11-25 MED ORDER — ACETAMINOPHEN 325 MG PO TABS
650.0000 mg | ORAL_TABLET | Freq: Once | ORAL | Status: AC
  Administered 2019-11-25: 650 mg via ORAL

## 2019-11-25 MED ORDER — DIPHENHYDRAMINE HCL 25 MG PO CAPS
ORAL_CAPSULE | ORAL | Status: AC
  Filled 2019-11-25: qty 2

## 2019-11-25 MED ORDER — SODIUM CHLORIDE 0.9 % IV SOLN
Freq: Once | INTRAVENOUS | Status: AC
  Filled 2019-11-25: qty 250

## 2019-11-25 MED ORDER — ACETAMINOPHEN 325 MG PO TABS
ORAL_TABLET | ORAL | Status: AC
  Filled 2019-11-25: qty 2

## 2019-11-25 MED ORDER — SODIUM CHLORIDE 0.9 % IV SOLN
375.0000 mg/m2 | Freq: Once | INTRAVENOUS | Status: AC
  Administered 2019-11-25: 700 mg via INTRAVENOUS
  Filled 2019-11-25: qty 20

## 2019-11-25 NOTE — Patient Instructions (Signed)
[facility] Discharge Instructions for Patients Receiving Chemotherapy  Today you received the following chemotherapy agents: Rituxan   To help prevent nausea and vomiting after your treatment, we encourage you to take your nausea medication as directed.    If you develop nausea and vomiting that is not controlled by your nausea medication, call the clinic.   BELOW ARE SYMPTOMS THAT SHOULD BE REPORTED IMMEDIATELY:  *FEVER GREATER THAN 100.5 F  *CHILLS WITH OR WITHOUT FEVER  NAUSEA AND VOMITING THAT IS NOT CONTROLLED WITH YOUR NAUSEA MEDICATION  *UNUSUAL SHORTNESS OF BREATH  *UNUSUAL BRUISING OR BLEEDING  TENDERNESS IN MOUTH AND THROAT WITH OR WITHOUT PRESENCE OF ULCERS  *URINARY PROBLEMS  *BOWEL PROBLEMS  UNUSUAL RASH Items with * indicate a potential emergency and should be followed up as soon as possible.  Feel free to call the clinic you have any questions or concerns. The clinic phone number is [phone].  Please show the CHEMO ALERT CARD at check-in to the Emergency Department and triage nurse.   

## 2019-11-25 NOTE — Progress Notes (Signed)
Hematology and Oncology Follow Up Visit  Nicole Campbell [id number] 1946-04-21 74 y.o. 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  She presented without lymphadenopathy and leukocytosis.  Prior therapy:   She is status post rituximab weekly for 4 weeks completed on August 21, 2018. She is status post thoracentesis completed on August 12, 2019 with positive cytology. Rituximab 375 mg per metered square weekly started on October 13 of 2020 and completed on 03/04/2019.  This will be repeated every 3 months last treatment completed in March 2021. Bendamustine and rituximab started on Sep 03, 2019.  She is here for cycle 3 of therapy.  Current therapy: Ibrutinib 420 mg daily started in July 2020.      Interim History: Nicole Campbell returns today for a follow-up visit.  Since the last visit, she reports no major changes in her health.  She is started ibrutinib and had taken it for the last few days without any complications.  She denies any nausea, vomiting or abdominal pain.  She denies any hematochezia or melena.  She denies any palpitation or shortness of breath.  She had a pleural effusion drained with improvement in her dyspnea.          .    Medications: Unchanged on review. Current Outpatient Medications  Medication Sig Dispense Refill  . hydrochlorothiazide (MICROZIDE) 12.5 MG capsule Take 12.5 mg by mouth daily.    . Ibrutinib 420 MG TABS Take 420 mg by mouth daily. 30 tablet 0  . levothyroxine (SYNTHROID, LEVOTHROID) 25 MCG tablet Take 25 mcg by mouth daily before breakfast.     . metoprolol succinate (TOPROL XL) 25 MG 24 hr tablet Take 1 tablet (25 mg total) by mouth daily. (Patient taking differently: Take 25 mg by mouth daily. As needed) 30 tablet 6  . prochlorperazine (COMPAZINE) 10 MG tablet Take 1 tablet (10 mg total) by mouth every  6 (six) hours as needed for nausea or vomiting. 30 tablet 0   No current facility-administered medications for this visit.     Allergies:  Allergies  Allergen Reactions  . Erythromycin Palpitations        Physical Exam:    Blood pressure (!) 145/55, pulse 98, temperature (!) 97.3 F (36.3 C), temperature source Temporal, resp. rate 18, weight 167 lb 8 oz (76 kg), SpO2 98 %.      ECOG:1    General appearance: Comfortable appearing without any discomfort Head: Normocephalic without any trauma Oropharynx: Mucous membranes are moist and pink without any thrush or ulcers. Eyes: Pupils are equal and round reactive to light. Lymph nodes: No cervical, supraclavicular, inguinal or axillary lymphadenopathy.   Heart:regular rate and rhythm.  S1 and S2 without leg edema. Lung: Clear without any rhonchi or wheezes.  No dullness to percussion. Abdomin: Soft, nontender, nondistended with good bowel sounds.  No hepatosplenomegaly. Musculoskeletal: No joint deformity or effusion.  Full range of motion noted. Neurological: No deficits noted on motor, sensory and deep tendon reflex exam. Skin: No petechial rash or dryness.  Appeared moist.                   Lab Results: Lab Results  Component Value Date   WBC 3.1 (L) 10/28/2019   HGB 12.5 10/28/2019   HCT 38.0 10/28/2019   MCV 91.3 10/28/2019   PLT 209 10/28/2019     Chemistry  Component Value Date/Time   NA 140 10/28/2019 0819   K 4.3 10/28/2019 0819   CL 109 10/28/2019 0819   CO2 22 10/28/2019 0819   BUN 14 10/28/2019 0819   CREATININE 0.89 10/28/2019 0819      Component Value Date/Time   CALCIUM 9.0 10/28/2019 0819   ALKPHOS 83 10/28/2019 0819   AST 25 10/28/2019 0819   ALT 15 10/28/2019 0819   BILITOT 0.5 10/28/2019 0819     IMPRESSION: 1. Overall mildly improved adenopathy in the chest, abdomen, and pelvis, although bulky adenopathy persists. 2. Large bilateral pleural effusions, mildly  worsened from prior, with associated passive atelectasis. These effusions are nonspecific for transudative or exudative etiology. 3. Other imaging findings of potential clinical significance: Coronary atherosclerosis. Prominent degenerative glenohumeral arthropathy bilaterally. Lower lumbar spondylosis and degenerative disc disease. 4. Aortic atherosclerosis.  Impression and Plan:   74 year old woman with:  1.  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  She was found to have p53 and ATM mutation after presenting with lymphocytosis and lymphadenopathy.  She completed 3 cycles of bendamustine and rituximab without any major complications.  CT scan obtained on 9 November 13, 2019 was personally reviewed and showed mild improvement in her adenopathy but her pleural effusion persisted.  Given the less of an robust response I have elected to proceed with ibrutinib instead.  Risks and benefits of this medication were reviewed again today.  Complications that include bruising, bleeding, nausea as well as tachycardia and cardiac arrhythmia were reviewed.  She is agreeable to proceed at this time.   2.  Rheumatoid arthritis: Improved today with the treatment of rituximab.  Rituximab will be continued to be administered periodically to help with her rheumatoid arthritis.  3.  IV access: No issues with femoral veins at this time.  4.  Pleural effusion: Related to CLL with the fluid analysis in April 2021 showed findings consistent with CLL.  She is status post thoracentesis on multiple occasions and hoping with the adequate CLL treatment the effusion will regress.  6.  Covid considerations: I continue to emphasize the importance of following appropriate health measures despite her fully vaccinated.  Given her CLL and treatment she could have less than adequate immune response to her Covid vaccination.  7.  Follow-up: In 4 weeks to follow her progress.   30  minutes were dedicated to this encounter.  The time  was spent reviewing imaging studies, discussing treatment options, addressing complications of therapy and future plan of care reviewed.   Zola Button, MD 7/27/20217:47 AM

## 2019-11-25 NOTE — Telephone Encounter (Signed)
Oral Oncology Patient Advocate Encounter  Was successful in securing patient a $ 8000 grant from [employer] to provide copayment coverage for [place].  This will keep the out of pocket expense at $0.    I have left a message for the patient.    The billing information is as follows and has been shared with [place].   Member ID: [id number] Group ID: [id number] RxBin: [id number] Dates of Eligibility: 11/25/19 through 11/24/20  [place] Patient [place] Phone [phone] Fax [phone] 11/25/2019 10:13 AM

## 2019-11-26 ENCOUNTER — Ambulatory Visit: Payer: PPO

## 2019-11-27 ENCOUNTER — Telehealth: Payer: Self-pay | Admitting: Oncology

## 2019-11-27 NOTE — Telephone Encounter (Signed)
Scheduled per los, patient has been called and notified. 

## 2019-12-08 ENCOUNTER — Encounter: Payer: Self-pay | Admitting: Oncology

## 2019-12-15 ENCOUNTER — Other Ambulatory Visit: Payer: Self-pay | Admitting: Oncology

## 2019-12-15 MED FILL — IMBRUVICA 420 MG TAB: 420 | 28 days supply | Qty: 28 | Fill #0

## 2019-12-17 ENCOUNTER — Other Ambulatory Visit: Payer: Self-pay

## 2019-12-17 ENCOUNTER — Telehealth: Payer: Self-pay

## 2019-12-17 ENCOUNTER — Encounter: Payer: Self-pay | Admitting: Medical

## 2019-12-17 ENCOUNTER — Inpatient Hospital Stay: Payer: PPO | Attending: Oncology | Admitting: Medical

## 2019-12-17 ENCOUNTER — Encounter: Payer: Self-pay | Admitting: Oncology

## 2019-12-17 VITALS — BP 167/74 | HR 96 | Temp 97.7°F | Resp 19 | Ht 66.0 in | Wt 164.0 lb

## 2019-12-17 DIAGNOSIS — Z23 Encounter for immunization: Secondary | ICD-10-CM | POA: Diagnosis not present

## 2019-12-17 DIAGNOSIS — M069 Rheumatoid arthritis, unspecified: Secondary | ICD-10-CM | POA: Insufficient documentation

## 2019-12-17 DIAGNOSIS — J90 Pleural effusion, not elsewhere classified: Secondary | ICD-10-CM | POA: Insufficient documentation

## 2019-12-17 DIAGNOSIS — M79604 Pain in right leg: Secondary | ICD-10-CM | POA: Insufficient documentation

## 2019-12-17 DIAGNOSIS — C9110 Chronic lymphocytic leukemia of B-cell type not having achieved remission: Secondary | ICD-10-CM | POA: Diagnosis not present

## 2019-12-17 MED ORDER — TRAMADOL HCL 50 MG PO TABS: ORAL_TABLET | ORAL | 0 refills | Status: DC

## 2019-12-17 MED ORDER — CYCLOBENZAPRINE HCL 5 MG PO TABS: 5.0000 mg | ORAL_TABLET | Freq: Three times a day (TID) | ORAL | 0 refills | Status: DC | PRN

## 2019-12-17 NOTE — Telephone Encounter (Signed)
-----   Message from Wyatt Portela, MD sent at 12/17/2019  9:02 AM EDT ----- Regarding: RE: Patient Concern [facility] today is fine. Thanks ----- Message ----- From: Teodoro Spray, RN Sent: 12/17/2019   8:30 AM EDT To: Wyatt Portela, MD Subject: Patient Concern                                Please see MyChart message below. Patient states she is having constant R leg pain that started Monday. States it feels like "a constant muscle spasm" that is worse when she is sitting or puts pressure on it. Patient has similar pain in April when she was evaluated for possible DVT, but states pain is worse this time.  Patient wants to know if this may be caused by the Imbruvica that she started taking in July. Next office visit with you is 12/24/19.  Would you like her to be evaluated by [facility] today?    Hi Dr. Alen Blew,  I'm having very severe leg pain in my right leg from my hip to my ankle, but the pain is worse from my knee to my ankle. It feels like a muscle cramp that won't let up. I've been up all night with it. I've read this can be an Imbruvica side effect. It began on Monday, and seems to be getting worse. Is there a pain med I can safely take with Imbruvica? I need some relief as soon as possible. Could there be something else going on?  Thank you!  Jule Ser

## 2019-12-17 NOTE — Telephone Encounter (Signed)
See message below. Patient scheduled to be seen at 1:30pm today in [facility].

## 2019-12-17 NOTE — Progress Notes (Signed)
Symptoms Management Clinic Progress Note   Nicole Campbell [id number] 04/19/1946 74 y.o.  Nicole Campbell is managed by Dr. Zola Button  Actively treated with chemotherapy/immunotherapy/hormonal therapy: yes  Current therapy: Imbruvica  Next scheduled appointment with provider: 12/24/2019  Assessment: Plan:    CLL (chronic lymphocytic leukemia) ([place])  Right leg pain   Chronic lymphocytic leukemia: The patient continues to be managed by Dr. Alen Blew and is currently treated with Imbruvica.  She is scheduled to be seen next on 12/24/2019.  Right leg pain: The patient's exam did not support a DVT. She was placed on Tramadol and Flexeril given her exam that was indicative of muscle spasms of the lower back.  She will call by Friday if she is no better at which time she would be referred for a Doppler ultrasound of her right lower extremity.  Please see After Visit Summary for patient specific instructions.  Future Appointments  Date Time Provider [place]  12/17/2019  1:45 PM Harle Stanford., PA-C [facility] None  12/24/2019 12:30 PM [facility] LAB [facility] None  12/24/2019  1:00 PM Wyatt Portela, MD [facility] None  01/16/2020 11:15 AM Trula Slade, DPM [facility] None  03/17/2020  3:40 PM Stanford Breed Denice Bors, MD [facility] None    No orders of the defined types were placed in this encounter.      Subjective:   Patient ID:  Nicole Campbell is a 75 y.o. (DOB 09-Jul-1945) female.  Chief Complaint: No chief complaint on file.   HPI Nicole Campbell  is a 74 y.o. female with a diagnosis of chronic lymphocytic leukemia.  She continues to be followed by Dr. Alen Blew and is treated with Imbruvica.  She presents to the clinic today after calling earlier this morning stating that she was having severe right leg pain from her hip to her ankle.  She reported that the pain was most severe from her knee to her ankle.  She stated that it feels like a muscle  cramp but it would not subside.  She had been up all night long.  Her pain initially began on Monday but has been progressively getting worse. She denies changes in activity or trauma.  Medications: I have reviewed the patient's current medications.  Allergies:  Allergies  Allergen Reactions  . Erythromycin Palpitations    Past Medical History:  Diagnosis Date  . CLL (chronic lymphocytic leukemia) ([place])   . Hypertension   . Hypothyroid   . MVP (mitral valve prolapse)   . Rheumatoid arthritis [facility])     Past Surgical History:  Procedure Laterality Date  . ABDOMINAL HYSTERECTOMY    . IR THORACENTESIS ASP PLEURAL SPACE W/IMG GUIDE  11/21/2019  . TONSILLECTOMY      Family History  Problem Relation Age of Onset  . Congestive Heart Failure Father     Social History   Socioeconomic History  . Marital status: Married    Spouse name: Not on file  . Number of children: 3  . Years of education: Not on file  . Highest education level: Not on file  Occupational History  . Not on file  Tobacco Use  . Smoking status: Never Smoker  . Smokeless tobacco: Never Used  Substance and Sexual Activity  . Alcohol use: Yes    Comment: Rare  . Drug use: Never  . Sexual activity: Not on file  Other Topics Concern  . Not on file  Social History Narrative  . Not on file  Social Determinants of Health   Financial Resource Strain:   . Difficulty of Paying Living Expenses:   Food Insecurity:   . Worried About Charity fundraiser in the Last Year:   . Arboriculturist in the Last Year:   Transportation Needs:   . Film/video editor (Medical):   Marland Kitchen Lack of Transportation (Non-Medical):   Physical Activity:   . Days of Exercise per Week:   . Minutes of Exercise per Session:   Stress:   . Feeling of Stress :   Social Connections:   . Frequency of Communication with Friends and Family:   . Frequency of Social Gatherings with Friends and Family:   . Attends Religious Services:   .  Active Member of Clubs or Organizations:   . Attends Archivist Meetings:   Marland Kitchen Marital Status:   Intimate Partner Violence:   . Fear of Current or Ex-Partner:   . Emotionally Abused:   Marland Kitchen Physically Abused:   . Sexually Abused:     Past Medical History, Surgical history, Social history, and Family history were reviewed and updated as appropriate.   Please see review of systems for further details on the patient's review from today.   Review of Systems:  Review of Systems  Constitutional: Negative for chills, diaphoresis and fever.  HENT: Negative for trouble swallowing and voice change.   Respiratory: Negative for cough, chest tightness, shortness of breath and wheezing.   Cardiovascular: Negative for chest pain and palpitations.  Gastrointestinal: Negative for abdominal pain, constipation, diarrhea, nausea and vomiting.  Musculoskeletal: Positive for arthralgias and myalgias. Negative for back pain.       Severe right leg pain from her hip to her ankle with pain  most severe from her knee to her ankle.   Neurological: Negative for dizziness, light-headedness and headaches.    Objective:   Physical Exam:  There were no vitals taken for this visit. ECOG: 1  Physical Exam Constitutional:      General: She is not in acute distress.    Appearance: Normal appearance. She is not ill-appearing, toxic-appearing or diaphoretic.  HENT:     Head: Normocephalic and atraumatic.  Eyes:     General: No scleral icterus.       Right eye: No discharge.        Left eye: No discharge.     Conjunctiva/sclera: Conjunctivae normal.  Cardiovascular:     Rate and Rhythm: Normal rate and regular rhythm.     Heart sounds: No murmur heard.  No friction rub. No gallop.   Pulmonary:     Effort: Pulmonary effort is normal. No respiratory distress.     Breath sounds: Normal breath sounds. No wheezing, rhonchi or rales.  Musculoskeletal:     Right lower leg: No edema.     Left lower leg:  No edema.     Comments: Calves bilaterally equal at 37 cm at 10 cm distal to the inferior poles of the patella.  Areas of induration and tenderness over the superior posterior iliac crests. Areas consistent with muscle spasms.  Skin:    General: Skin is warm and dry.     Coloration: Skin is not jaundiced or pale.     Findings: No bruising, erythema or rash.  Neurological:     Mental Status: She is alert.     Coordination: Coordination normal.     Gait: Gait normal.  Psychiatric:        Mood and Affect:  Mood normal.        Behavior: Behavior normal.        Thought Content: Thought content normal.        Judgment: Judgment normal.     Lab Review:     Component Value Date/Time   NA 139 11/25/2019 0739   K 3.8 11/25/2019 0739   CL 105 11/25/2019 0739   CO2 25 11/25/2019 0739   GLUCOSE 105 (H) 11/25/2019 0739   BUN 13 11/25/2019 0739   CREATININE 0.83 11/25/2019 0739   CALCIUM 9.3 11/25/2019 0739   PROT 5.4 (L) 11/25/2019 0739   ALBUMIN 3.2 (L) 11/25/2019 0739   AST 16 11/25/2019 0739   ALT 10 11/25/2019 0739   ALKPHOS 74 11/25/2019 0739   BILITOT 0.9 11/25/2019 0739   GFRNONAA >60 11/25/2019 0739   GFRAA >60 11/25/2019 0739       Component Value Date/Time   WBC 4.6 11/25/2019 0739   WBC 30.1 (H) 12/12/2018 0104   RBC 3.66 (L) 11/25/2019 0739   HGB 11.1 (L) 11/25/2019 0739   HCT 33.9 (L) 11/25/2019 0739   PLT 132 (L) 11/25/2019 0739   MCV 92.6 11/25/2019 0739   MCH 30.3 11/25/2019 0739   MCHC 32.7 11/25/2019 0739   RDW 15.6 (H) 11/25/2019 0739   LYMPHSABS 1.9 11/25/2019 0739   MONOABS 0.5 11/25/2019 0739   EOSABS 0.3 11/25/2019 0739   BASOSABS 0.0 11/25/2019 0739   -------------------------------  Imaging from last 24 hours (if applicable):  Radiology interpretation: DG Chest 1 View  Result Date: 11/21/2019 CLINICAL DATA:  Status post RIGHT thoracentesis. EXAM: CHEST  1 VIEW COMPARISON:  11/13/2019 FINDINGS: A RIGHT pleural effusion has decreased in size,  now small to moderate. A moderate to large LEFT pleural effusion is again noted. There is no evidence of pneumothorax. Bibasilar atelectasis again noted. No acute bony abnormalities are noted. Degenerative changes in the shoulders again identified. IMPRESSION: Decreased RIGHT pleural effusion following thoracentesis. No evidence of pneumothorax. Unchanged LEFT pleural effusion. Electronically Signed   By: Margarette Canada M.D.   On: 11/21/2019 10:39   IR THORACENTESIS ASP PLEURAL SPACE W/IMG GUIDE  Result Date: 11/21/2019 INDICATION: History of CLL. Bilateral pleural effusions. Request for therapeutic thoracentesis. EXAM: ULTRASOUND GUIDED RIGHT THORACENTESIS MEDICATIONS: None. COMPLICATIONS: None immediate. PROCEDURE: An ultrasound guided thoracentesis was thoroughly discussed with the patient and questions answered. The benefits, risks, alternatives and complications were also discussed. The patient understands and wishes to proceed with the procedure. Written consent was obtained. Ultrasound reveals large bilateral pleural effusions, right greater than left. Ultrasound was performed to localize and mark an adequate pocket of fluid in the right chest. The area was then prepped and draped in the normal sterile fashion. 1% Lidocaine was used for local anesthesia. Under ultrasound guidance a 6 Fr Safe-T-Centesis catheter was introduced. Thoracentesis was performed. Patient complained of cough in significant right-sided chest discomfort, at which point the procedure was terminated. The catheter was removed and a dressing applied. FINDINGS: A total of approximately 1.4 L of cloudy yellow fluid was removed. IMPRESSION: Successful ultrasound guided right thoracentesis yielding 1.4 L of pleural fluid. Read by: Ascencion Dike PA-C Electronically Signed   By: Lucrezia Europe M.D.   On: 11/21/2019 10:38        This case was discussed Dr. Alen Blew. He expressed agreement with my management of this patient.

## 2019-12-17 NOTE — Patient Instructions (Signed)

## 2019-12-24 ENCOUNTER — Other Ambulatory Visit: Payer: Self-pay

## 2019-12-24 ENCOUNTER — Inpatient Hospital Stay: Payer: PPO

## 2019-12-24 ENCOUNTER — Inpatient Hospital Stay (HOSPITAL_BASED_OUTPATIENT_CLINIC_OR_DEPARTMENT_OTHER): Payer: PPO | Admitting: Oncology

## 2019-12-24 ENCOUNTER — Telehealth: Payer: Self-pay | Admitting: Oncology

## 2019-12-24 VITALS — BP 151/68 | HR 96 | Temp 97.8°F | Resp 18 | Ht 66.0 in | Wt 162.0 lb

## 2019-12-24 DIAGNOSIS — C9110 Chronic lymphocytic leukemia of B-cell type not having achieved remission: Secondary | ICD-10-CM

## 2019-12-24 LAB — CMP (CANCER CENTER ONLY)
ALT: <6 U/L (ref 0–44)
AST: 10 U/L — ABNORMAL LOW (ref 15–41)
Albumin: 3.6 g/dL (ref 3.5–5.0)
Alkaline Phosphatase: 70 U/L (ref 38–126)
Anion gap: 4 — ABNORMAL LOW (ref 5–15)
BUN: 13 mg/dL (ref 8–23)
CO2: 30 mmol/L (ref 22–32)
Calcium: 9.6 mg/dL (ref 8.9–10.3)
Chloride: 105 mmol/L (ref 98–111)
Creatinine: 0.74 mg/dL (ref 0.44–1.00)
GFR, Est AFR Am: >60 mL/min (ref >60)
GFR, Estimated: >60 mL/min (ref >60)
Glucose, Bld: 93 mg/dL (ref 70–99)
Potassium: 4.3 mmol/L (ref 3.5–5.1)
Sodium: 139 mmol/L (ref 135–145)
Total Bilirubin: 0.7 mg/dL (ref 0.3–1.2)
Total Protein: 5.6 g/dL — ABNORMAL LOW (ref 6.5–8.1)

## 2019-12-24 LAB — CBC WITH DIFFERENTIAL (CANCER CENTER ONLY)
Abs Immature Granulocytes: 0.01 10*3/uL (ref 0.00–0.07)
Basophils Absolute: 0.1 10*3/uL (ref 0.0–0.1)
Basophils Relative: 1 %
Eosinophils Absolute: 0.1 10*3/uL (ref 0.0–0.5)
Eosinophils Relative: 2 %
HCT: 34.9 % — ABNORMAL LOW (ref 36.0–46.0)
Hemoglobin: 11.5 g/dL — ABNORMAL LOW (ref 12.0–15.0)
Immature Granulocytes: 0 %
Lymphocytes Relative: 48 %
Lymphs Abs: 2.6 10*3/uL (ref 0.7–4.0)
MCH: 32.4 pg (ref 26.0–34.0)
MCHC: 33 g/dL (ref 30.0–36.0)
MCV: 98.3 fL (ref 80.0–100.0)
Monocytes Absolute: 0.7 10*3/uL (ref 0.1–1.0)
Monocytes Relative: 13 %
Neutro Abs: 2 10*3/uL (ref 1.7–7.7)
Neutrophils Relative %: 36 %
Platelet Count: 190 10*3/uL (ref 150–400)
RBC: 3.55 MIL/uL — ABNORMAL LOW (ref 3.87–5.11)
RDW: 14.7 % (ref 11.5–15.5)
WBC Count: 5.5 10*3/uL (ref 4.0–10.5)
nRBC: 0 % (ref 0.0–0.2)

## 2019-12-24 NOTE — Telephone Encounter (Signed)
Scheduled per 8/25 los. Pt will be out of town the beginning of October. Pt requesed 10/7. Printed avs and calendar for pt.

## 2019-12-24 NOTE — Progress Notes (Signed)
Hematology and Oncology Follow Up Visit  Nicole Campbell [id number] February 22, 1946 74 y.o. 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  She was found to have stage I with p53 deletion and deletion of ATM and presented with lymphadenopathy and leukocytosis.  Prior therapy:   She is status post rituximab weekly for 4 weeks completed on August 21, 2018. She is status post thoracentesis completed on August 12, 2019 with positive cytology. Rituximab 375 mg per metered square weekly started on October 13 of 2020 and completed on 03/04/2019.  This will be repeated every 3 months last treatment completed in March 2021. Bendamustine and rituximab started on Sep 03, 2019.  She is here for cycle 3 of therapy.  Current therapy: Ibrutinib 420 mg daily started in July 2020.      Interim History: Nicole Campbell is here for return evaluation.  Since the last visit, she reports no major complications to ibrutinib.  She denies nausea, vomiting or abdominal pain.  Her respiratory status has improved at this time.  She reports improvement in her ability to take a deep breath without any cough or wheezing.  She has reported the lower extremity pain that has been persistent for the last few weeks.  Her pain is on the right side from the knee down and exacerbated mostly at nighttime.  She was prescribed tramadol and muscle relaxant without any improvement.  She is ambulating without any difficulties.          .    Medications: Updated on review. Current Outpatient Medications  Medication Sig Dispense Refill  . cyclobenzaprine (FLEXERIL) 5 MG tablet Take 1 tablet (5 mg total) by mouth 3 (three) times daily as needed for muscle spasms. 30 tablet 0  . hydrochlorothiazide (MICROZIDE) 12.5 MG capsule Take 12.5 mg by mouth daily.    . IMBRUVICA 420 MG TABS TAKE 420 MG BY MOUTH DAILY. 28 tablet 0  . levothyroxine  (SYNTHROID, LEVOTHROID) 25 MCG tablet Take 25 mcg by mouth daily before breakfast.     . prochlorperazine (COMPAZINE) 10 MG tablet Take 1 tablet (10 mg total) by mouth every 6 (six) hours as needed for nausea or vomiting. 30 tablet 0  . traMADol (ULTRAM) 50 MG tablet 1 to 2 PO Q 6 hours prn pain 30 tablet 0   No current facility-administered medications for this visit.     Allergies:  Allergies  Allergen Reactions  . Erythromycin Palpitations        Physical Exam:      Blood pressure (!) 151/68, pulse 96, temperature 97.8 F (36.6 C), temperature source Tympanic, resp. rate 18, height '5\' 6"'  (1.676 m), weight 162 lb (73.5 kg), SpO2 97 %.     ECOG:1     General appearance: Alert, awake without any distress. Head: Atraumatic without abnormalities Oropharynx: Without any thrush or ulcers. Eyes: No scleral icterus. Lymph nodes: No lymphadenopathy noted in the cervical, supraclavicular, or axillary nodes Heart:regular rate and rhythm, without any murmurs or gallops.   Lung: Clear to auscultation without any rhonchi, wheezes or dullness to percussion. Abdomin: Soft, nontender without any shifting dullness or ascites. Musculoskeletal: No clubbing or cyanosis. Neurological: No motor or sensory deficits. Skin: No rashes or lesions.                  Lab Results: Lab Results  Component Value Date   WBC 4.6 11/25/2019   HGB 11.1 (L) 11/25/2019  HCT 33.9 (L) 11/25/2019   MCV 92.6 11/25/2019   PLT 132 (L) 11/25/2019     Chemistry      Component Value Date/Time   NA 139 11/25/2019 0739   K 3.8 11/25/2019 0739   CL 105 11/25/2019 0739   CO2 25 11/25/2019 0739   BUN 13 11/25/2019 0739   CREATININE 0.83 11/25/2019 0739      Component Value Date/Time   CALCIUM 9.3 11/25/2019 0739   ALKPHOS 74 11/25/2019 0739   AST 16 11/25/2019 0739   ALT 10 11/25/2019 0739   BILITOT 0.9 11/25/2019 0739      Impression and Plan:   74 year old woman with:  1.   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  She was found to have p53 and ATM mutation.    She is currently on ibrutinib which has been well-tolerated without any major complications.  Risks and benefits of continuing this treatment along term were discussed.  Potential complications that include bleeding, bruising, cardiac issues were reviewed.  Alternative treatment options were also discussed including venetoclax versus different oral targeted therapy.  Given her excellent clinical benefit at this time with continues to have a complete hematological remission, I recommended continuing ibrutinib for the time being.  Laboratory data reviewed showed adequate hematological parameters.   2.  Rheumatoid arthritis: Last rituximab treatment was given on 11/25/2019 and received 375 mg per metered square.  This will be repeated in October 2021.  3.  IV access: Peripheral veins are currently in use without any need for IV access.  4.  Pleural effusion: She is status post thoracentesis with improvement in her respiratory status.  Physical examination today and her reporting indicate improvement in her pleural effusion.  This is likely represents a clinical benefit from her CLL treatments.  6.  Covid considerations: Given her diagnosis and treatment she would be at increased risk of complications from [id number] infection.  She would benefit from vaccination booster.  7.  Follow-up: She will return in the next 5 to 6 weeks to follow her progress.   30  minutes were spent on this visit.  The time was dedicated to reviewing laboratory data, disease status update and outlining future plan of care.   Zola Button, MD 8/25/202112:31 PM

## 2019-12-29 ENCOUNTER — Inpatient Hospital Stay: Payer: PPO

## 2019-12-29 ENCOUNTER — Other Ambulatory Visit: Payer: Self-pay

## 2019-12-29 DIAGNOSIS — Z23 Encounter for immunization: Secondary | ICD-10-CM

## 2019-12-30 DIAGNOSIS — H0288B Meibomian gland dysfunction left eye, upper and lower eyelids: Secondary | ICD-10-CM | POA: Diagnosis not present

## 2019-12-30 DIAGNOSIS — H04123 Dry eye syndrome of bilateral lacrimal glands: Secondary | ICD-10-CM | POA: Diagnosis not present

## 2019-12-30 DIAGNOSIS — H0288A Meibomian gland dysfunction right eye, upper and lower eyelids: Secondary | ICD-10-CM | POA: Diagnosis not present

## 2019-12-30 DIAGNOSIS — H26493 Other secondary cataract, bilateral: Secondary | ICD-10-CM | POA: Diagnosis not present

## 2019-12-30 DIAGNOSIS — Z961 Presence of intraocular lens: Secondary | ICD-10-CM | POA: Diagnosis not present

## 2019-12-30 DIAGNOSIS — H35371 Puckering of macula, right eye: Secondary | ICD-10-CM | POA: Diagnosis not present

## 2020-01-08 ENCOUNTER — Other Ambulatory Visit: Payer: Self-pay | Admitting: Oncology

## 2020-01-13 MED FILL — IMBRUVICA 420 MG TAB: 420 | 28 days supply | Qty: 28 | Fill #0

## 2020-01-16 ENCOUNTER — Ambulatory Visit: Payer: PPO | Admitting: Podiatry

## 2020-01-16 ENCOUNTER — Other Ambulatory Visit: Payer: Self-pay

## 2020-01-16 DIAGNOSIS — M2141 Flat foot [pes planus] (acquired), right foot: Secondary | ICD-10-CM

## 2020-01-16 DIAGNOSIS — I7381 Erythromelalgia: Secondary | ICD-10-CM

## 2020-01-16 DIAGNOSIS — L84 Corns and callosities: Secondary | ICD-10-CM

## 2020-01-16 DIAGNOSIS — M2142 Flat foot [pes planus] (acquired), left foot: Secondary | ICD-10-CM

## 2020-01-16 DIAGNOSIS — G629 Polyneuropathy, unspecified: Secondary | ICD-10-CM

## 2020-01-16 MED ORDER — GABAPENTIN 100 MG PO CAPS: 100.0000 mg | ORAL_CAPSULE | Freq: Every day | ORAL | 0 refills | Status: DC

## 2020-01-16 MED ORDER — GABAPENTIN 100 MG PO CAPS
100.0000 mg | ORAL_CAPSULE | Freq: Every day | ORAL | 0 refills | Status: DC
Start: 2020-01-16 — End: 2020-02-05

## 2020-01-16 NOTE — Progress Notes (Signed)
Subjective: 74 year old female presents the office today for concerns of burning, tingling to her legs and she states there is also redness to her legs and feels that she is always wearing her right sock. She states this became worse after I last saw her. She had not had any recent treatment for this. She also states that she gets calluses to the balls of her feet which cause discomfort. She has been wearing the orthotics which are been helpful and has continued to be redone if she is to get new inserts. They have been helpful for her and she denies any other foot pain. Denies any systemic complaints such as fevers, chills, nausea, vomiting. No acute changes since last appointment, and no other complaints at this time.   Recently had a venous duplex showed DVT in April which is negative.  Objective: AAO x3, NAD DP/PT pulses palpable bilaterally, CRT less than 3 seconds Sensation mildly decreased with Semmes Weinstein monofilament. She describes burning, tightness around her ankles which has been chronic. There is erythema to the legs which is blanchable. There is no significant warmth. Hyperkeratotic lesions submetatarsal bilaterally. No underlying ulceration drainage or signs of infection. 2 lesion on the right and one on the left. Flatfoot is present. MMT 5/5. 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  -We will start gabapentin 100 mg at nighttime and titrate off. Discussed compression, elevation. -Debrided the hyperkeratotic lesions x3 without any complications or bleeding -Continue orthotics. She has a trip coming out and she will drop them off so they can be resurfaced. -Patient encouraged to call the office with any questions, concerns, change in symptoms.    Trula Slade DPM

## 2020-01-16 NOTE — Patient Instructions (Signed)

## 2020-01-27 DIAGNOSIS — H109 Unspecified conjunctivitis: Secondary | ICD-10-CM | POA: Diagnosis not present

## 2020-02-04 ENCOUNTER — Other Ambulatory Visit: Payer: Self-pay | Admitting: Oncology

## 2020-02-05 ENCOUNTER — Other Ambulatory Visit: Payer: Self-pay

## 2020-02-05 ENCOUNTER — Inpatient Hospital Stay: Payer: PPO | Attending: Oncology | Admitting: Oncology

## 2020-02-05 ENCOUNTER — Inpatient Hospital Stay: Payer: PPO

## 2020-02-05 VITALS — BP 150/66 | HR 91 | Temp 97.2°F | Resp 18 | Ht 66.0 in | Wt 161.3 lb

## 2020-02-05 DIAGNOSIS — C9110 Chronic lymphocytic leukemia of B-cell type not having achieved remission: Secondary | ICD-10-CM | POA: Insufficient documentation

## 2020-02-05 DIAGNOSIS — M069 Rheumatoid arthritis, unspecified: Secondary | ICD-10-CM | POA: Insufficient documentation

## 2020-02-05 DIAGNOSIS — J90 Pleural effusion, not elsewhere classified: Secondary | ICD-10-CM | POA: Diagnosis not present

## 2020-02-05 LAB — CBC WITH DIFFERENTIAL (CANCER CENTER ONLY)
Abs Immature Granulocytes: 0.01 10*3/uL (ref 0.00–0.07)
Basophils Absolute: 0.1 10*3/uL (ref 0.0–0.1)
Basophils Relative: 1 %
Eosinophils Absolute: 0.2 10*3/uL (ref 0.0–0.5)
Eosinophils Relative: 3 %
HCT: 34.3 % — ABNORMAL LOW (ref 36.0–46.0)
Hemoglobin: 11.2 g/dL — ABNORMAL LOW (ref 12.0–15.0)
Immature Granulocytes: 0 %
Lymphocytes Relative: 33 %
Lymphs Abs: 1.5 10*3/uL (ref 0.7–4.0)
MCH: 30.7 pg (ref 26.0–34.0)
MCHC: 32.7 g/dL (ref 30.0–36.0)
MCV: 94 fL (ref 80.0–100.0)
Monocytes Absolute: 0.7 10*3/uL (ref 0.1–1.0)
Monocytes Relative: 14 %
Neutro Abs: 2.2 10*3/uL (ref 1.7–7.7)
Neutrophils Relative %: 49 %
Platelet Count: 147 10*3/uL — ABNORMAL LOW (ref 150–400)
RBC: 3.65 MIL/uL — ABNORMAL LOW (ref 3.87–5.11)
RDW: 12.1 % (ref 11.5–15.5)
WBC Count: 4.6 10*3/uL (ref 4.0–10.5)
nRBC: 0 % (ref 0.0–0.2)

## 2020-02-05 LAB — CMP (CANCER CENTER ONLY)
ALT: 14 U/L (ref 0–44)
AST: 14 U/L — ABNORMAL LOW (ref 15–41)
Albumin: 3.3 g/dL — ABNORMAL LOW (ref 3.5–5.0)
Alkaline Phosphatase: 74 U/L (ref 38–126)
Anion gap: 3 — ABNORMAL LOW (ref 5–15)
BUN: 16 mg/dL (ref 8–23)
CO2: 31 mmol/L (ref 22–32)
Calcium: 9.1 mg/dL (ref 8.9–10.3)
Chloride: 107 mmol/L (ref 98–111)
Creatinine: 0.74 mg/dL (ref 0.44–1.00)
GFR, Estimated: >60 mL/min (ref >60)
Glucose, Bld: 108 mg/dL — ABNORMAL HIGH (ref 70–99)
Potassium: 3.9 mmol/L (ref 3.5–5.1)
Sodium: 141 mmol/L (ref 135–145)
Total Bilirubin: 0.6 mg/dL (ref 0.3–1.2)
Total Protein: 5.4 g/dL — ABNORMAL LOW (ref 6.5–8.1)

## 2020-02-05 NOTE — Progress Notes (Signed)
Hematology and Oncology Follow Up Visit  Nicole Campbell [id number] [birth year] 74 y.o. 02/05/2020 4:06 PM Nicole Campbell, MDSmith, [name], MD   Principle Diagnosis: 74 year old woman with CLL presented with p53 deletion, deletion of ATM in addition to stage I lymphadenopathy and leukocytosis in 2019.  Prior therapy:   She is status post rituximab weekly for 4 weeks completed on August 21, 2018. She is status post thoracentesis completed on August 12, 2019 with positive cytology. Rituximab 375 mg per metered square weekly started on October 13 of 2020 and completed on 03/04/2019.  This will be repeated every 3 months last treatment completed in March 2021. Bendamustine and rituximab started on Sep 03, 2019.  She is here for cycle 3 of therapy.  Current therapy: Ibrutinib 420 mg daily started in July 2020.      Interim History: Nicole Campbell is here for a follow-up evaluation.  Since the last visit, she continues to tolerate ibrutinib without any major complaints.  She denies any nausea, fatigue or shortness of breath.  He denies any dyspnea on exertion or any constitutional symptoms.  Her performance status and quality of life remained intact.  She did report some mucosal drying in her eyes and nasal passages.          .    Medications: Reviewed without changes. Current Outpatient Medications  Medication Sig Dispense Refill  . IMBRUVICA 420 MG TABS TAKE 1 TABLET (420 MG) BY MOUTH DAILY. 28 tablet 0  . levothyroxine (SYNTHROID, LEVOTHROID) 25 MCG tablet Take 25 mcg by mouth daily before breakfast.     . prochlorperazine (COMPAZINE) 10 MG tablet Take 1 tablet (10 mg total) by mouth every 6 (six) hours as needed for nausea or vomiting. 30 tablet 0  . traMADol (ULTRAM) 50 MG tablet 1 to 2 PO Q 6 hours prn pain 30 tablet 0   No current facility-administered medications for this visit.     Allergies:  Allergies  Allergen Reactions  . Erythromycin Palpitations         Physical Exam:      Blood pressure (!) 150/66, pulse 91, temperature (!) 97.2 F (36.2 C), temperature source Tympanic, resp. rate 18, height '5\' 6"'  (1.676 m), weight 161 lb 4.8 oz (73.2 kg), SpO2 96 %.     ECOG:1      General appearance: Comfortable appearing without any discomfort Head: Normocephalic without any trauma Oropharynx: Mucous membranes are moist and pink without any thrush or ulcers. Eyes: Pupils are equal and round reactive to light. Lymph nodes: No cervical, supraclavicular, inguinal or axillary lymphadenopathy.   Heart:regular rate and rhythm.  S1 and S2 without leg edema. Lung: Clear without any rhonchi or wheezes.  No dullness to percussion. Abdomin: Soft, nontender, nondistended with good bowel sounds.  No hepatosplenomegaly. Musculoskeletal: No joint deformity or effusion.  Full range of motion noted. Neurological: No deficits noted on motor, sensory and deep tendon reflex exam. Skin: No petechial rash or dryness.  Appeared moist.                   Lab Results: Lab Results  Component Value Date   WBC 4.6 02/05/2020   HGB 11.2 (L) 02/05/2020   HCT 34.3 (L) 02/05/2020   MCV 94.0 02/05/2020   PLT 147 (L) 02/05/2020     Chemistry      Component Value Date/Time   NA 141 02/05/2020 1518   K 3.9 02/05/2020 1518   CL 107 02/05/2020 1518   CO2 31  02/05/2020 1518   BUN 16 02/05/2020 1518   CREATININE 0.74 02/05/2020 1518      Component Value Date/Time   CALCIUM 9.1 02/05/2020 1518   ALKPHOS 74 02/05/2020 1518   AST 14 (L) 02/05/2020 1518   ALT 14 02/05/2020 1518   BILITOT 0.6 02/05/2020 1518      Impression and Plan:   74 year old woman with:  1.  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  She presented with stage I disease in addition to p53 and ATM mutation.    She continues to have excellent hematological and clinical response to ibrutinib without any complications.  Risks and benefits of continuing this medication  versus alternative treatment options were reviewed.  At this time she is experiencing no major complications with excellent benefits.  Laboratory data showed complete hematological response I have recommended continuing this medication.   2.  Rheumatoid arthritis: He status post rituximab with excellent improvement in her symptoms.  Risks and benefits of repeat rituximab infusion were discussed.  At this time we opted to defer repeat rituximab infusion unless she develops symptoms in the future.  Complication associated with rituximab especially immunosuppression is worrisome at this time.  We will reevaluate in the future.   3.  Pleural effusion: Clinically improved after the start of ibrutinib.  No further thoracentesis is needed.  4.  Covid considerations: He has completed vaccination series including her booster.  5.  Follow-up: In the next 4 to 6 weeks for repeat follow-up.   30  minutes were dedicated to this encounter.  Time was spent on reviewing his disease status, discussing treatment options, reviewing laboratory data as well as complications related to her therapy.   Nicole Button, MD 10/7/20214:06 PM

## 2020-02-09 ENCOUNTER — Ambulatory Visit: Payer: PPO

## 2020-02-09 MED FILL — IMBRUVICA 420 MG TAB: 420 | 28 days supply | Qty: 28 | Fill #0

## 2020-02-25 ENCOUNTER — Encounter: Payer: Self-pay | Admitting: Oncology

## 2020-03-02 ENCOUNTER — Other Ambulatory Visit: Payer: Self-pay | Admitting: Oncology

## 2020-03-08 MED FILL — IMBRUVICA 420 MG TAB: 420 | 28 days supply | Qty: 28 | Fill #0

## 2020-03-13 NOTE — Progress Notes (Signed)
HPI: FU palpitations. CTAAugust 2020 showed no aneurysm or dissection. There was increase in lymphadenopathy (Pt with known CLL).Laboratories June 10, 2019 showed free T4 1.08 and TSH 4.25. Echocardiogram April 2021 showed normal LV function, grade 1 diastolic dysfunction and pleural effusion.  Venous Dopplers April 2021 showed no DVT.  Chest CT April 2021 showed marked progression of the adenopathy consistent with lymphoma.  There was note of new small to moderate bilateral pleural effusions. Patient had thoracentesis and cytology consistent with lymphoma. Monitor May 2021 showed sinus with occasional PACs, brief PAT and PVCs. Chest xray showed decreased right pleural effusion following thoracentesis and unchanged left effusion. Since last seen  the [name] is helping with her lymphoma.  She now denies dyspnea, chest pain, syncope.  Minimal pedal edema.  She continues to have occasional palpitations that are not significantly bothersome.  Current Outpatient Medications  Medication Sig Dispense Refill  . IMBRUVICA 420 MG TABS TAKE 1 TABLET (420 MG) BY MOUTH DAILY. 28 tablet 0  . levothyroxine (SYNTHROID, LEVOTHROID) 25 MCG tablet Take 25 mcg by mouth daily before breakfast.      No current facility-administered medications for this visit.     Past Medical History:  Diagnosis Date  . CLL (chronic lymphocytic leukemia) ([place])   . Hypertension   . Hypothyroid   . MVP (mitral valve prolapse)   . Rheumatoid arthritis [facility])     Past Surgical History:  Procedure Laterality Date  . ABDOMINAL HYSTERECTOMY    . IR THORACENTESIS ASP PLEURAL SPACE W/IMG GUIDE  11/21/2019  . TONSILLECTOMY      Social History   Socioeconomic History  . Marital status: Married    Spouse name: Not on file  . Number of children: 3  . Years of education: Not on file  . Highest education level: Not on file  Occupational History  . Not on file  Tobacco Use  . Smoking status: Never Smoker  .  Smokeless tobacco: Never Used  Substance and Sexual Activity  . Alcohol use: Yes    Comment: Rare  . Drug use: Never  . Sexual activity: Not on file  Other Topics Concern  . Not on file  Social History Narrative  . Not on file   Social Determinants of Health   Financial Resource Strain:   . Difficulty of Paying Living Expenses: Not on file  Food Insecurity:   . Worried About Charity fundraiser in the Last Year: Not on file  . Ran Out of Food in the Last Year: Not on file  Transportation Needs:   . Lack of Transportation (Medical): Not on file  . Lack of Transportation (Non-Medical): Not on file  Physical Activity:   . Days of Exercise per Week: Not on file  . Minutes of Exercise per Session: Not on file  Stress:   . Feeling of Stress : Not on file  Social Connections:   . Frequency of Communication with Friends and Family: Not on file  . Frequency of Social Gatherings with Friends and Family: Not on file  . Attends Religious Services: Not on file  . Active Member of Clubs or Organizations: Not on file  . Attends Archivist Meetings: Not on file  . Marital Status: Not on file  Intimate Partner Violence:   . Fear of Current or Ex-Partner: Not on file  . Emotionally Abused: Not on file  . Physically Abused: Not on file  . Sexually Abused: Not on file  Family History  Problem Relation Age of Onset  . Congestive Heart Failure Father     ROS: no fevers or chills, productive cough, hemoptysis, dysphasia, odynophagia, melena, hematochezia, dysuria, hematuria, rash, seizure activity, orthopnea, PND, pedal edema, claudication. Remaining systems are negative.  Physical Exam: Well-developed well-nourished in no acute distress.  Skin is warm and dry.  HEENT is normal.  Neck is supple.  Chest is clear to auscultation with normal expansion.  Cardiovascular exam is regular rate and rhythm.  Abdominal exam nontender or distended. No masses palpated. Extremities show  trace edema. neuro grossly intact  ECG-sinus rhythm at a rate of 86, no ST changes.  Personally reviewed  A/P  1 palpitations-previous monitor showed PACs, brief PAT and PVCs. She will continue Toprol as needed. LV function is normal. Symptoms are reasonably well controlled.  2 history of mitral valve prolapse-not evident on most recent echo.  3 recurrent lymphoma-Per oncology.  Kirk Ruths, MD

## 2020-03-16 ENCOUNTER — Other Ambulatory Visit: Payer: Self-pay

## 2020-03-16 ENCOUNTER — Inpatient Hospital Stay (HOSPITAL_BASED_OUTPATIENT_CLINIC_OR_DEPARTMENT_OTHER): Payer: PPO | Admitting: Oncology

## 2020-03-16 ENCOUNTER — Inpatient Hospital Stay: Payer: PPO | Attending: Oncology

## 2020-03-16 VITALS — BP 162/60 | HR 98 | Temp 97.7°F | Resp 20 | Ht 66.0 in | Wt 167.1 lb

## 2020-03-16 DIAGNOSIS — M069 Rheumatoid arthritis, unspecified: Secondary | ICD-10-CM | POA: Insufficient documentation

## 2020-03-16 DIAGNOSIS — C9110 Chronic lymphocytic leukemia of B-cell type not having achieved remission: Secondary | ICD-10-CM

## 2020-03-16 LAB — CBC WITH DIFFERENTIAL (CANCER CENTER ONLY)
Abs Immature Granulocytes: 0.03 10*3/uL (ref 0.00–0.07)
Basophils Absolute: 0.1 10*3/uL (ref 0.0–0.1)
Basophils Relative: 1 %
Eosinophils Absolute: 0.1 10*3/uL (ref 0.0–0.5)
Eosinophils Relative: 1 %
HCT: 35.8 % — ABNORMAL LOW (ref 36.0–46.0)
Hemoglobin: 11.6 g/dL — ABNORMAL LOW (ref 12.0–15.0)
Immature Granulocytes: 0 %
Lymphocytes Relative: 19 %
Lymphs Abs: 1.4 10*3/uL (ref 0.7–4.0)
MCH: 29.9 pg (ref 26.0–34.0)
MCHC: 32.4 g/dL (ref 30.0–36.0)
MCV: 92.3 fL (ref 80.0–100.0)
Monocytes Absolute: 1.7 10*3/uL — ABNORMAL HIGH (ref 0.1–1.0)
Monocytes Relative: 23 %
Neutro Abs: 4.2 10*3/uL (ref 1.7–7.7)
Neutrophils Relative %: 56 %
Platelet Count: 227 10*3/uL (ref 150–400)
RBC: 3.88 MIL/uL (ref 3.87–5.11)
RDW: 12.6 % (ref 11.5–15.5)
WBC Count: 7.5 10*3/uL (ref 4.0–10.5)
nRBC: 0 % (ref 0.0–0.2)

## 2020-03-16 LAB — CMP (CANCER CENTER ONLY)
ALT: 39 U/L (ref 0–44)
AST: 44 U/L — ABNORMAL HIGH (ref 15–41)
Albumin: 3.3 g/dL — ABNORMAL LOW (ref 3.5–5.0)
Alkaline Phosphatase: 87 U/L (ref 38–126)
Anion gap: 7 (ref 5–15)
BUN: 16 mg/dL (ref 8–23)
CO2: 27 mmol/L (ref 22–32)
Calcium: 8.6 mg/dL — ABNORMAL LOW (ref 8.9–10.3)
Chloride: 105 mmol/L (ref 98–111)
Creatinine: 0.76 mg/dL (ref 0.44–1.00)
GFR, Estimated: >60 mL/min (ref >60)
Glucose, Bld: 97 mg/dL (ref 70–99)
Potassium: 3.9 mmol/L (ref 3.5–5.1)
Sodium: 139 mmol/L (ref 135–145)
Total Bilirubin: 1 mg/dL (ref 0.3–1.2)
Total Protein: 5.8 g/dL — ABNORMAL LOW (ref 6.5–8.1)

## 2020-03-16 NOTE — Progress Notes (Signed)
Hematology and Oncology Follow Up Visit  Nicole Campbell [id number] 1945-08-25 74 y.o. 03/16/2020 3:11 PM [name], MDSmith, [name], MD   Principle Diagnosis: 74 year old woman with CLL diagnosed in 2019.  She presented with lymphadenopathy and lymphocytosis with p53 deletion, deletion of ATM.   Prior therapy:   She is status post rituximab weekly for 4 weeks completed on August 21, 2018. She is status post thoracentesis completed on August 12, 2019 with positive cytology. Rituximab 375 mg per metered square weekly started on October 13 of 2020 and completed on 03/04/2019.  This will be repeated every 3 months last treatment completed in March 2021. Bendamustine and rituximab started on Sep 03, 2019.  She is here for cycle 3 of therapy.  Current therapy: Ibrutinib 420 mg daily started in July 2020.      Interim History: Ms. Brugger returns today for a follow-up visit.  Since the last visit, she reports no major changes in her health.  She continues to tolerate ibrutinib without any new complaints.  She denies any nausea, vomiting or abdominal pain.  She denies any lymphadenopathy or petechiae.  She denies any ecchymosis or GI bleeding.  Her performance status and quality of life remains unchanged.          .    Medications: Unchanged on review. Current Outpatient Medications  Medication Sig Dispense Refill  . IMBRUVICA 420 MG TABS TAKE 1 TABLET (420 MG) BY MOUTH DAILY. 28 tablet 0  . levothyroxine (SYNTHROID, LEVOTHROID) 25 MCG tablet Take 25 mcg by mouth daily before breakfast.     . prochlorperazine (COMPAZINE) 10 MG tablet Take 1 tablet (10 mg total) by mouth every 6 (six) hours as needed for nausea or vomiting. 30 tablet 0  . traMADol (ULTRAM) 50 MG tablet 1 to 2 PO Q 6 hours prn pain 30 tablet 0   No current facility-administered medications for this visit.     Allergies:  Allergies  Allergen Reactions  . Erythromycin Palpitations        Physical  Exam:      Blood pressure (!) 162/60, pulse 98, temperature 97.7 F (36.5 C), resp. rate 20, height '5\' 6"'  (1.676 m), weight 167 lb 1.6 oz (75.8 kg), SpO2 97 %.      ECOG:1      General appearance: Alert, awake without any distress. Head: Atraumatic without abnormalities Oropharynx: Without any thrush or ulcers. Eyes: No scleral icterus. Lymph nodes: No lymphadenopathy noted in the cervical, supraclavicular, or axillary nodes Heart:regular rate and rhythm, without any murmurs or gallops.   Lung: Clear to auscultation without any rhonchi, wheezes or dullness to percussion. Abdomin: Soft, nontender without any shifting dullness or ascites. Musculoskeletal: No clubbing or cyanosis. Neurological: No motor or sensory deficits. Skin: No rashes or lesions.                   Lab Results: Lab Results  Component Value Date   WBC 7.5 03/16/2020   HGB 11.6 (L) 03/16/2020   HCT 35.8 (L) 03/16/2020   MCV 92.3 03/16/2020   PLT 227 03/16/2020     Chemistry      Component Value Date/Time   NA 141 02/05/2020 1518   K 3.9 02/05/2020 1518   CL 107 02/05/2020 1518   CO2 31 02/05/2020 1518   BUN 16 02/05/2020 1518   CREATININE 0.74 02/05/2020 1518      Component Value Date/Time   CALCIUM 9.1 02/05/2020 1518   ALKPHOS 74 02/05/2020 1518  AST 14 (L) 02/05/2020 1518   ALT 14 02/05/2020 1518   BILITOT 0.6 02/05/2020 1518      Impression and Plan:   74 year old woman with:  1.  Stage I CLL presented with lymphadenopathy and lymphocytosis diagnosed in November 2019.    He continues to tolerate ibrutinib without any major complications at this time.  Laboratory data from today reviewed and showed normalization of her lymphocyte count.  Risks and benefits of continuing this treatment long-term were discussed.  Alternative treatment options and therapy discontinuation options were reviewed.  After discussion today she is agreeable to continue at this time.   2.   Rheumatoid arthritis: Very little symptoms noted at this time.  She has received rituximab in the past this option will be deferred for the time being.   3.  Pleural effusion: Resolved at this time with CLL therapy.  4.  Covid considerations: She is up-to-date on her vaccination series.  5.  Follow-up: She will return in 6 to 8 weeks for follow-up.   30  minutes were spent on this encounter.  Time was dedicated to updating disease status, discussing treatment options and future plan of care review.   Zola Button, MD 11/16/20213:11 PM

## 2020-03-17 ENCOUNTER — Ambulatory Visit: Payer: PPO | Admitting: Cardiology

## 2020-03-17 ENCOUNTER — Encounter: Payer: Self-pay | Admitting: Cardiology

## 2020-03-17 VITALS — BP 160/78 | HR 86 | Ht 66.5 in | Wt 163.0 lb

## 2020-03-17 DIAGNOSIS — R002 Palpitations: Secondary | ICD-10-CM | POA: Diagnosis not present

## 2020-03-17 DIAGNOSIS — I341 Nonrheumatic mitral (valve) prolapse: Secondary | ICD-10-CM | POA: Diagnosis not present

## 2020-03-17 NOTE — Patient Instructions (Signed)

## 2020-03-23 DIAGNOSIS — L718 Other rosacea: Secondary | ICD-10-CM | POA: Diagnosis not present

## 2020-03-23 DIAGNOSIS — L820 Inflamed seborrheic keratosis: Secondary | ICD-10-CM | POA: Diagnosis not present

## 2020-03-30 ENCOUNTER — Other Ambulatory Visit: Payer: Self-pay | Admitting: Oncology

## 2020-04-05 ENCOUNTER — Telehealth: Payer: Self-pay

## 2020-04-05 MED FILL — IMBRUVICA 420 MG TAB: 420 | 28 days supply | Qty: 28 | Fill #0

## 2020-04-05 NOTE — Telephone Encounter (Signed)
Oral Oncology Patient Advocate Encounter   Was successful in securing patient an $41 grant from [facility] [employer] [facility]) to provide copayment coverage for Imbruvica.  This will keep the out of pocket expense at $0.     I have spoken with the patient.    The billing information is as follows and has been shared with [place].   Member ID: [id number] Group ID: [id number] RxBin: [id number] Dates of Eligibility: 02/18/20 through 02/16/21  Fund:  [place] [facility] [place] Phone [phone] Fax [phone] 04/05/2020 2:08 PM

## 2020-04-16 ENCOUNTER — Ambulatory Visit: Payer: PPO | Admitting: Podiatry

## 2020-04-27 ENCOUNTER — Encounter: Payer: Self-pay | Admitting: Oncology

## 2020-04-27 DIAGNOSIS — R509 Fever, unspecified: Secondary | ICD-10-CM | POA: Diagnosis not present

## 2020-04-27 DIAGNOSIS — R059 Cough, unspecified: Secondary | ICD-10-CM | POA: Diagnosis not present

## 2020-04-27 DIAGNOSIS — R0981 Nasal congestion: Secondary | ICD-10-CM | POA: Diagnosis not present

## 2020-04-27 DIAGNOSIS — U071 COVID-19: Secondary | ICD-10-CM | POA: Diagnosis not present

## 2020-04-28 ENCOUNTER — Other Ambulatory Visit: Payer: Self-pay | Admitting: Oncology

## 2020-04-28 ENCOUNTER — Encounter: Payer: Self-pay | Admitting: Oncology

## 2020-04-28 DIAGNOSIS — U071 COVID-19: Secondary | ICD-10-CM | POA: Diagnosis not present

## 2020-04-28 DIAGNOSIS — R059 Cough, unspecified: Secondary | ICD-10-CM | POA: Diagnosis not present

## 2020-04-28 DIAGNOSIS — R0981 Nasal congestion: Secondary | ICD-10-CM | POA: Diagnosis not present

## 2020-04-28 DIAGNOSIS — R509 Fever, unspecified: Secondary | ICD-10-CM | POA: Diagnosis not present

## 2020-05-03 MED FILL — IMBRUVICA 420 MG TAB: 420 | 28 days supply | Qty: 28 | Fill #0

## 2020-05-11 ENCOUNTER — Encounter: Payer: Self-pay | Admitting: Oncology

## 2020-05-11 DIAGNOSIS — J40 Bronchitis, not specified as acute or chronic: Secondary | ICD-10-CM | POA: Diagnosis not present

## 2020-05-11 DIAGNOSIS — Z8616 Personal history of COVID-19: Secondary | ICD-10-CM | POA: Diagnosis not present

## 2020-05-12 ENCOUNTER — Telehealth: Payer: Self-pay

## 2020-05-12 NOTE — Telephone Encounter (Signed)
Contacted pt. In regard to COVID 19 infusion therapies. Pt. Started having symptoms 04/26/20 and is out of the window for treatment. Verbalizes understanding.

## 2020-05-20 ENCOUNTER — Other Ambulatory Visit: Payer: Self-pay

## 2020-05-20 ENCOUNTER — Inpatient Hospital Stay: Payer: PPO | Attending: Oncology

## 2020-05-20 ENCOUNTER — Inpatient Hospital Stay: Payer: PPO | Admitting: Oncology

## 2020-05-20 VITALS — BP 144/68 | HR 95 | Temp 97.8°F | Resp 18 | Ht 66.0 in | Wt 164.0 lb

## 2020-05-20 DIAGNOSIS — M069 Rheumatoid arthritis, unspecified: Secondary | ICD-10-CM | POA: Diagnosis not present

## 2020-05-20 DIAGNOSIS — J90 Pleural effusion, not elsewhere classified: Secondary | ICD-10-CM | POA: Diagnosis not present

## 2020-05-20 DIAGNOSIS — R0982 Postnasal drip: Secondary | ICD-10-CM | POA: Diagnosis not present

## 2020-05-20 DIAGNOSIS — Z8616 Personal history of COVID-19: Secondary | ICD-10-CM | POA: Diagnosis not present

## 2020-05-20 DIAGNOSIS — C9110 Chronic lymphocytic leukemia of B-cell type not having achieved remission: Secondary | ICD-10-CM

## 2020-05-20 LAB — CMP (CANCER CENTER ONLY)
ALT: 22 U/L (ref 0–44)
AST: 26 U/L (ref 15–41)
Albumin: 3.1 g/dL — ABNORMAL LOW (ref 3.5–5.0)
Alkaline Phosphatase: 91 U/L (ref 38–126)
Anion gap: 6 (ref 5–15)
BUN: 12 mg/dL (ref 8–23)
CO2: 26 mmol/L (ref 22–32)
Calcium: 8.3 mg/dL — ABNORMAL LOW (ref 8.9–10.3)
Chloride: 107 mmol/L (ref 98–111)
Creatinine: 0.78 mg/dL (ref 0.44–1.00)
GFR, Estimated: >60 mL/min (ref >60)
Glucose, Bld: 102 mg/dL — ABNORMAL HIGH (ref 70–99)
Potassium: 4.2 mmol/L (ref 3.5–5.1)
Sodium: 139 mmol/L (ref 135–145)
Total Bilirubin: 0.6 mg/dL (ref 0.3–1.2)
Total Protein: 5.4 g/dL — ABNORMAL LOW (ref 6.5–8.1)

## 2020-05-20 LAB — CBC WITH DIFFERENTIAL (CANCER CENTER ONLY)
Abs Immature Granulocytes: 0.02 10*3/uL (ref 0.00–0.07)
Basophils Absolute: 0.1 10*3/uL (ref 0.0–0.1)
Basophils Relative: 1 %
Eosinophils Absolute: 0.2 10*3/uL (ref 0.0–0.5)
Eosinophils Relative: 3 %
HCT: 35.4 % — ABNORMAL LOW (ref 36.0–46.0)
Hemoglobin: 11.1 g/dL — ABNORMAL LOW (ref 12.0–15.0)
Immature Granulocytes: 0 %
Lymphocytes Relative: 21 %
Lymphs Abs: 1 10*3/uL (ref 0.7–4.0)
MCH: 27.5 pg (ref 26.0–34.0)
MCHC: 31.4 g/dL (ref 30.0–36.0)
MCV: 87.6 fL (ref 80.0–100.0)
Monocytes Absolute: 1.1 10*3/uL — ABNORMAL HIGH (ref 0.1–1.0)
Monocytes Relative: 23 %
Neutro Abs: 2.5 10*3/uL (ref 1.7–7.7)
Neutrophils Relative %: 52 %
Platelet Count: 167 10*3/uL (ref 150–400)
RBC: 4.04 MIL/uL (ref 3.87–5.11)
RDW: 14.5 % (ref 11.5–15.5)
WBC Count: 4.8 10*3/uL (ref 4.0–10.5)
nRBC: 0 % (ref 0.0–0.2)

## 2020-05-20 MED ORDER — GUAIFENESIN ER 600 MG PO TB12: 600.0000 mg | ORAL_TABLET | Freq: Two times a day (BID) | ORAL | 0 refills | Status: DC

## 2020-05-20 NOTE — Progress Notes (Signed)
Hematology and Oncology Follow Up Visit  Nicole Campbell [id number] 1945-08-30 75 y.o. 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  Prior therapy:   She is status post rituximab weekly for 4 weeks completed on August 21, 2018. She is status post thoracentesis completed on August 12, 2019 with positive cytology. Rituximab 375 mg per metered square weekly started on October 13 of 2020 and completed on 03/04/2019.  This will be repeated every 3 months last treatment completed in March 2021. Bendamustine and rituximab started on Sep 03, 2019.  She is here for cycle 3 of therapy.  Current therapy: Ibrutinib 420 mg daily started in July 2020.      Interim History: Ms. Mortellaro returns today for repeat evaluation.  Since last visit, she developed COVID-19 illness but did not require any hospitalization.  Her symptoms were more predominantly fever and cough which her fever has resolved at this time but her cough has persisted.  She does report loss of appetite.  She denies any shortness of breath or difficulty breathing.  She denies any excessive fatigue.  Tiredness.  Pulm status quality of life remain excellent.  Continues to take ibrutinib without any new complaints.          .    Medications: Reviewed without changes. Current Outpatient Medications  Medication Sig Dispense Refill  . IMBRUVICA 420 MG TABS TAKE 1 TABLET (420 MG) BY MOUTH DAILY. 28 tablet 0  . levothyroxine (SYNTHROID, LEVOTHROID) 25 MCG tablet Take 25 mcg by mouth daily before breakfast.      No current facility-administered medications for this visit.     Allergies:  Allergies  Allergen Reactions  . Erythromycin Palpitations        Physical Exam:       Blood pressure (!) 144/68, pulse 95, temperature 97.8 F (36.6 C), temperature source Tympanic,  resp. rate 18, height '5\' 6"'  (1.676 m), weight 164 lb (74.4 kg), SpO2 97 %.     ECOG:1      General appearance: Comfortable appearing without any discomfort Head: Normocephalic without any trauma Oropharynx: Mucous membranes are moist and pink without any thrush or ulcers. Eyes: Pupils are equal and round reactive to light. Lymph nodes: No cervical, supraclavicular, inguinal or axillary lymphadenopathy.   Heart:regular rate and rhythm.  S1 and S2 without leg edema. Lung: Clear without any rhonchi or wheezes.  No dullness to percussion. Abdomin: Soft, nontender, nondistended with good bowel sounds.  No hepatosplenomegaly. Musculoskeletal: No joint deformity or effusion.  Full range of motion noted. Neurological: No deficits noted on motor, sensory and deep tendon reflex exam. Skin: No petechial rash or dryness.  Appeared moist.                      Lab Results: Lab Results  Component Value Date   WBC 7.5 03/16/2020   HGB 11.6 (L) 03/16/2020   HCT 35.8 (L) 03/16/2020   MCV 92.3 03/16/2020   PLT 227 03/16/2020     Chemistry      Component Value Date/Time   NA 139 03/16/2020 1502   K 3.9 03/16/2020 1502   CL 105 03/16/2020 1502   CO2 27 03/16/2020 1502   BUN 16 03/16/2020 1502   CREATININE 0.76 03/16/2020 1502      Component Value Date/Time   CALCIUM 8.6 (L) 03/16/2020 1502   ALKPHOS 87  03/16/2020 1502   AST 44 (H) 03/16/2020 1502   ALT 39 03/16/2020 1502   BILITOT 1.0 03/16/2020 1502      Impression and Plan:   75 year old woman with:  1.  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  She was found to have stage I disease with lymphadenopathy and lymphocytosis..    She is currently on ibrutinib without any major complications.  Risks and benefits of continuing this treatment were discussed at this time.  She had an excellent response by normalization of her lymphocytosis, lymphadenopathy as well as pleural effusion.  Complications associated with this  treatment including cardiac issues, palpitation and bleeding were discussed.  Alternative treatment options including venetoclax other chemotherapy agents.  These will be deferred unless she has progression of disease.  He is agreeable at this time.   2.  Rheumatoid arthritis: No recent exacerbation.   3.  Pleural effusion: No evidence of reaccumulation noted at this time.  4.  Covid considerations: She has completed vaccination as well as natural immunity.  It is difficult to ascertain how much immune response she can generate from both vaccination and natural immunity given her CLL and Rituxan exposure.  Continue to recommend universal masking and other mitigation measures.  She still having cough that is predominantly postnasal drip induced.  Prescription for Mucinex was sent to her pharmacy.  5.  Follow-up: will be in 10 weeks for a follow-up.   30  minutes were dedicated to this visit.  The time was spent on reviewing laboratory data, disease status update and future plan of care discussion.   Zola Button, MD 1/20/20222:58 PM

## 2020-05-21 ENCOUNTER — Encounter: Payer: Self-pay | Admitting: Oncology

## 2020-05-25 ENCOUNTER — Other Ambulatory Visit: Payer: Self-pay | Admitting: Oncology

## 2020-05-31 MED FILL — IMBRUVICA 420 MG TAB: 420 | 28 days supply | Qty: 28 | Fill #0

## 2020-06-02 DIAGNOSIS — J40 Bronchitis, not specified as acute or chronic: Secondary | ICD-10-CM | POA: Diagnosis not present

## 2020-06-02 DIAGNOSIS — J329 Chronic sinusitis, unspecified: Secondary | ICD-10-CM | POA: Diagnosis not present

## 2020-06-22 ENCOUNTER — Other Ambulatory Visit: Payer: Self-pay

## 2020-06-22 ENCOUNTER — Other Ambulatory Visit: Payer: Self-pay | Admitting: Family Medicine

## 2020-06-22 ENCOUNTER — Ambulatory Visit
Admission: RE | Admit: 2020-06-22 | Discharge: 2020-06-22 | Disposition: A | Discharge status: Home / Self Care | Payer: PPO | Source: Ambulatory Visit | Attending: Family Medicine | Admitting: Family Medicine

## 2020-06-22 DIAGNOSIS — U099 Post covid-19 condition, unspecified: Secondary | ICD-10-CM

## 2020-06-22 DIAGNOSIS — R059 Cough, unspecified: Secondary | ICD-10-CM | POA: Diagnosis not present

## 2020-06-22 DIAGNOSIS — R053 Chronic cough: Secondary | ICD-10-CM | POA: Diagnosis not present

## 2020-06-24 ENCOUNTER — Other Ambulatory Visit: Payer: Self-pay | Admitting: Oncology

## 2020-06-28 ENCOUNTER — Encounter: Payer: Self-pay | Admitting: Oncology

## 2020-06-28 MED FILL — IMBRUVICA 420 MG TAB: 420 | 28 days supply | Qty: 28 | Fill #0

## 2020-07-01 ENCOUNTER — Other Ambulatory Visit: Payer: Self-pay

## 2020-07-01 ENCOUNTER — Ambulatory Visit: Payer: PPO | Admitting: Internal Medicine

## 2020-07-01 ENCOUNTER — Encounter: Payer: Self-pay | Admitting: Internal Medicine

## 2020-07-01 ENCOUNTER — Ambulatory Visit (INDEPENDENT_AMBULATORY_CARE_PROVIDER_SITE_OTHER): Payer: PPO

## 2020-07-01 DIAGNOSIS — R0600 Dyspnea, unspecified: Secondary | ICD-10-CM | POA: Diagnosis not present

## 2020-07-01 DIAGNOSIS — J90 Pleural effusion, not elsewhere classified: Secondary | ICD-10-CM

## 2020-07-01 DIAGNOSIS — R0609 Other forms of dyspnea: Secondary | ICD-10-CM

## 2020-07-01 LAB — D-DIMER, QUANTITATIVE: D-Dimer, Quant: 7.68 mcg/mL FEU — ABNORMAL HIGH (ref <0.50)

## 2020-07-01 MED ORDER — HYDROCODONE-HOMATROPINE 5-1.5 MG/5ML PO SYRP: 5.0000 mL | ORAL_SOLUTION | ORAL | 0 refills | Status: DC | PRN

## 2020-07-01 NOTE — Assessment & Plan Note (Signed)
Onset of symptoms Feb 2021  - L thoracentesis 08/12/2019 :  1 liter   Exudative, nl glucose,  11k lymphs > flow cyt c/w lymphoma > referred back to oncology and hold off on R tap as feeling much better as of 08/14/2019  - venous dopplers rec due to D dimer 0.95 >>>  08/14/2019 neg bilaterally  - R Tcentesis ordered  R > L effusions may not be lymphoma given she has RA and or parapneumonic process > Discussed in detail all the  indications, usual  risks and alternatives  relative to the benefits with patient who agrees to proceed with w/u with R t centesis asap aince having now to sit upright

## 2020-07-01 NOTE — Patient Instructions (Signed)
For cough > hydromet  1-2 tsp every 4-6 hours  Please remember to go to the lab and x-ray department  for your tests - we will call you with the results when they are available.

## 2020-07-01 NOTE — Progress Notes (Signed)
Nicole Campbell, female    DOB: May 08, 1945,    MRN: [id number]   Brief patient profile:  53 yowf never smoker with RA onset 2016 while in [place] and moved to [place] rx mtx and various other meds Nicole Campbell then noted elevated wbc Nov 2019 dx CLL observation rx intially then Rituxan started 07/2018 which helped the CLL > RA symptoms tried mtx again >> "horrible low back pain" with CT 12/12/2018 neg for pl effusion or ILD  better p pred rx then tapered off by Feb 1st covid shot Feb 12th, then feb 20th 2021 new dry cough indolent onset present daily > March 12 at [place] cxr showed fluid rec no rx per pt   referred to pulmonary clinic 08/07/2019 by Dr   [name]  2nd shot March 7th 2021 > rx 10 days abx for ? Infection at site  = doxycycline   History of Present Illness  08/07/2019  Pulmonary/ 1st office eval/Nicole Campbell RA pt with effusion but RA symptoms are better  Chief Complaint  Patient presents with  . Consult    Referred by Dr. [name] for pleural effusion and cough. Non productive cough.   Dyspnea:  No MMRC2 = can't walk a nl pace on a flat grade s sob but does fine slow and flat [employer] Cough: dry/ worse with talking/ laughing  Sleep: avg 45 degrees x 6 months due to breathing better  SABA use: none Worse than usual swelling R > L first noted in 2016  rec Please remember to go to the lab and x-ray department   for your tests - we will call you with the results when they are available. I'll set up follow up after review of the above.  Add:  No obvious dx with bilateral effusions likely worse on L > proceed with dx/therapeutic L t centesis  > pos for lymphoma     07/01/2020    ov/Nicole Campbell re: still 3 days left on levaquin, mucus is clear  Was slt green prior o levaquin Chief Complaint  Patient presents with  . Acute Visit    Had covid 19 end of Dec 2021- She states has had cough since then. She has had SOB over the past 2 wks- currently on levaquin 500 per PCP for PNA. Her  cough is occ prod with clear to yellow sputum.    Dyspnea: inside house, no grocery in over a week  Cough: rattling now mucus Sleeping: almost upright x 2 m SABA use: none  02: none  Covid status:   vax x 3 last  12/29/19    No obvious day to day or daytime variability or assoc excess/ purulent sputum or mucus plugs or hemoptysis or cp or chest tightness, subjective wheeze or overt sinus or hb symptoms.    Also denies any obvious fluctuation of symptoms with weather or environmental changes or other aggravating or alleviating factors except as outlined above   No unusual exposure hx or h/o childhood pna/ asthma or knowledge of premature birth.  Current Allergies, Complete Past Medical History, Past Surgical History, Family History, and Social History were reviewed in [employer] record.  ROS  The following are not active complaints unless bolded Hoarseness, sore throat, dysphagia, dental problems, itching, sneezing,  nasal congestion or discharge of excess mucus or purulent secretions, ear ache,   fever, chills, sweats, unintended wt loss or wt gain, classically pleuritic or exertional cp,  orthopnea pnd or arm/hand swelling  or leg swelling, presyncope, palpitations, abdominal pain, anorexia, nausea, vomiting, diarrhea  or change in bowel habits or change in bladder habits, change in stools or change in urine, dysuria, hematuria,  rash, arthralgias, visual complaints, headache, numbness, weakness or ataxia or problems with walking or coordination,  change in mood or  memory.        Current Meds  Medication Sig  . IMBRUVICA 420 MG TABS TAKE 1 TABLET (420 MG) BY MOUTH DAILY.  Marland Kitchen levofloxacin (LEVAQUIN) 500 MG tablet Take 500 mg by mouth daily.  Marland Kitchen levothyroxine (SYNTHROID, LEVOTHROID) 25 MCG tablet Take 25 mcg by mouth daily before breakfast.             Past Medical History:  Diagnosis Date  . CLL (chronic lymphocytic leukemia) ([place])   . Hypertension   .  Hypothyroid   . MVP (mitral valve prolapse)   . Rheumatoid arthritis ([place])     Outpatient Medications Prior to Visit  Medication Sig Dispense Refill  . levothyroxine (SYNTHROID, LEVOTHROID) 25 MCG tablet Take 25 mcg by mouth daily before breakfast.         Objective:     Wt Readings from Last 3 Encounters:  07/01/20 163 lb 6.4 oz (74.1 kg)  05/20/20 164 lb (74.4 kg)  03/17/20 163 lb (73.9 kg)      Vital signs reviewed  07/01/2020  - Note at rest 02 sats  95% on RA   General appearance:   Chronically ill edlerly wf nad      HEENT : pt wearing mask not removed for exam due to covid -19 concerns.    NECK :  without JVD/Nodes/TM/ nl carotid upstrokes bilaterally   LUNGS: no acc muscle use,  Nl contour chest with decreased bs/ dullness R > L base  CV:  RRR  no s3 or murmur or increase in P2, and no edema   ABD:  soft and nontender with nl inspiratory excursion in the supine position. No bruits or organomegaly appreciated, bowel sounds nl  MS:  Nl gait/ ext warm without deformities, calf tenderness, cyanosis or clubbing No obvious joint restrictions   SKIN: warm and dry without lesions    NEURO:  Campbell, approp, nl sensorium with  no motor or cerebellar deficits apparent.    CXR PA and Lateral:   07/01/2020 :    I personally reviewed images and   impression as follows:   R >L bilateral effusions      Labs ordered/ reviewed:      Chemistry      Component Value Date/Time   NA 136 07/01/2020 1529   K 4.7 07/01/2020 1529   CL 101 07/01/2020 1529   CO2 28 07/01/2020 1529   BUN 9 07/01/2020 1529   CREATININE 0.67 07/01/2020 1529   CREATININE 0.78 05/20/2020 1452      Component Value Date/Time   CALCIUM 8.6 07/01/2020 1529   ALKPHOS 87 07/01/2020 1529   AST 19 07/01/2020 1529   AST 26 05/20/2020 1452   ALT 12 07/01/2020 1529   ALT 22 05/20/2020 1452   BILITOT 0.7 07/01/2020 1529   BILITOT 0.6 05/20/2020 1452        Lab Results  Component Value Date   WBC  8.5 07/01/2020   HGB 10.9 (L) 07/01/2020   HCT 33.3 (L) 07/01/2020   MCV 81.1 07/01/2020   PLT 381.0 07/01/2020     Lab Results  Component Value Date   DDIMER 7.68 (H) 07/01/2020  Lab Results  Component Value Date   TSH 2.48 07/01/2020     Lab Results  Component Value Date   PROBNP 18.0 07/01/2020       Lab Results  Component Value Date   ESRSEDRATE 29 07/01/2020   ESRSEDRATE 6 08/07/2019                              Assessment

## 2020-07-02 ENCOUNTER — Telehealth: Payer: Self-pay

## 2020-07-02 ENCOUNTER — Inpatient Hospital Stay (HOSPITAL_COMMUNITY)
Admission: RE | Admit: 2020-07-02 | Discharge: 2020-07-02 | Disposition: A | Discharge status: Home / Self Care | Payer: PPO | Source: Ambulatory Visit

## 2020-07-02 ENCOUNTER — Telehealth: Payer: Self-pay | Admitting: Internal Medicine

## 2020-07-02 ENCOUNTER — Ambulatory Visit (INDEPENDENT_AMBULATORY_CARE_PROVIDER_SITE_OTHER)
Admission: RE | Admit: 2020-07-02 | Discharge: 2020-07-02 | Disposition: A | Discharge status: Home / Self Care | Payer: PPO | Source: Ambulatory Visit | Attending: Internal Medicine | Admitting: Internal Medicine

## 2020-07-02 DIAGNOSIS — R0609 Other forms of dyspnea: Secondary | ICD-10-CM

## 2020-07-02 DIAGNOSIS — J90 Pleural effusion, not elsewhere classified: Secondary | ICD-10-CM | POA: Diagnosis not present

## 2020-07-02 DIAGNOSIS — R0600 Dyspnea, unspecified: Secondary | ICD-10-CM

## 2020-07-02 DIAGNOSIS — E041 Nontoxic single thyroid nodule: Secondary | ICD-10-CM | POA: Diagnosis not present

## 2020-07-02 DIAGNOSIS — J9811 Atelectasis: Secondary | ICD-10-CM | POA: Diagnosis not present

## 2020-07-02 DIAGNOSIS — R0602 Shortness of breath: Secondary | ICD-10-CM | POA: Diagnosis not present

## 2020-07-02 LAB — BRAIN NATRIURETIC PEPTIDE: Pro B Natriuretic peptide (BNP): 18 pg/mL (ref 0.0–100.0)

## 2020-07-02 LAB — TSH: TSH: 2.48 u[IU]/mL (ref 0.35–4.50)

## 2020-07-02 LAB — BASIC METABOLIC PANEL
BUN: 9 mg/dL (ref 6–23)
CO2: 28 mEq/L (ref 19–32)
Calcium: 8.6 mg/dL (ref 8.4–10.5)
Chloride: 101 mEq/L (ref 96–112)
Creatinine, Ser: 0.67 mg/dL (ref 0.40–1.20)
GFR: 86.7 mL/min (ref >60.00)
Glucose, Bld: 100 mg/dL — ABNORMAL HIGH (ref 70–99)
Potassium: 4.7 mEq/L (ref 3.5–5.1)
Sodium: 136 mEq/L (ref 135–145)

## 2020-07-02 LAB — CBC WITH DIFFERENTIAL/PLATELET
Basophils Absolute: 0.1 10*3/uL (ref 0.0–0.1)
Basophils Relative: 0.9 % (ref 0.0–3.0)
Eosinophils Absolute: 0.1 10*3/uL (ref 0.0–0.7)
Eosinophils Relative: 0.9 % (ref 0.0–5.0)
HCT: 33.3 % — ABNORMAL LOW (ref 36.0–46.0)
Hemoglobin: 10.9 g/dL — ABNORMAL LOW (ref 12.0–15.0)
Lymphocytes Relative: 11.5 % — ABNORMAL LOW (ref 12.0–46.0)
Lymphs Abs: 1 10*3/uL (ref 0.7–4.0)
MCHC: 32.7 g/dL (ref 30.0–36.0)
MCV: 81.1 fl (ref 78.0–100.0)
Monocytes Absolute: 1.4 10*3/uL — ABNORMAL HIGH (ref 0.1–1.0)
Monocytes Relative: 16.9 % — ABNORMAL HIGH (ref 3.0–12.0)
Neutro Abs: 5.9 10*3/uL (ref 1.4–7.7)
Neutrophils Relative %: 69.8 % (ref 43.0–77.0)
Platelets: 381 10*3/uL (ref 150.0–400.0)
RBC: 4.1 Mil/uL (ref 3.87–5.11)
RDW: 16.5 % — ABNORMAL HIGH (ref 11.5–15.5)
WBC: 8.5 10*3/uL (ref 4.0–10.5)

## 2020-07-02 LAB — SEDIMENTATION RATE: Sed Rate: 29 mm/hr (ref 0–30)

## 2020-07-02 LAB — HEPATIC FUNCTION PANEL
ALT: 12 U/L (ref 0–35)
AST: 19 U/L (ref 0–37)
Albumin: 3.1 g/dL — ABNORMAL LOW (ref 3.5–5.2)
Alkaline Phosphatase: 87 U/L (ref 39–117)
Bilirubin, Direct: 0.1 mg/dL (ref 0.0–0.3)
Total Bilirubin: 0.7 mg/dL (ref 0.2–1.2)
Total Protein: 5.6 g/dL — ABNORMAL LOW (ref 6.0–8.3)

## 2020-07-02 MED ORDER — IOHEXOL 350 MG/ML SOLN
80.0000 mL | Freq: Once | INTRAVENOUS | Status: AC | PRN
  Administered 2020-07-02: 80 mL via INTRAVENOUS

## 2020-07-02 NOTE — Progress Notes (Signed)
Pt not tested for covid today(07/02/20) due to pt testing + for covid on 04/29/20(results in Media tab (05/03/20). Based on the guidelines the pt is in the 90 day window to not retest. The pt is still expected to quarantine until their procedure. Therefore, the pt can still have the scheduled procedure.

## 2020-07-02 NOTE — Progress Notes (Signed)
Called and went over lab result per Dr Melvyn Novas with patient. All questions answered and patient expressed full understanding of result and aware [facility] will be calling to schedule CT angio. Order placed per Dr Melvyn Novas.

## 2020-07-02 NOTE — Telephone Encounter (Signed)
-----   Message from Tanda Rockers, MD sent at 07/02/2020 11:38 AM EST ----- Call patient :  needs CTa chest today stat - dx doe/ pos d dimer

## 2020-07-02 NOTE — Telephone Encounter (Signed)
Called patient and went over Lab result per Dr Melvyn Novas with patient. All questions answered and patient expressed full understanding and aware [facility] will be calling to schedule CT angio for today.

## 2020-07-02 NOTE — Telephone Encounter (Signed)
Called [facility] at [phone] and spoke with Taryn. She wanted to let us know that the CTA results were ready for MW to review.   IMPRESSION: 1. No evidence of pulmonary embolus. 2. Large bilateral pleural effusions, right greater than left. 3. Ground-glass airspace disease within the left upper and left lower lobes, unchanged since recent chest x-ray, consistent with pneumonia. 4. Significant compressive atelectasis primarily within the lower lobes. 5. Interval resolution of the diffuse intrathoracic adenopathy seen previously. 6. Nonspecific 1.6 cm right lobe thyroid nodule, not significantly changed since prior exams. Recommend thyroid US if not previously performed. (Ref: J Am Coll Radiol. 2015 Feb;12(2): 143-50). 7.  Aortic Atherosclerosis (ICD10-I70.0).   MW, please advise. Thanks!

## 2020-07-03 ENCOUNTER — Encounter: Payer: Self-pay | Admitting: Internal Medicine

## 2020-07-03 NOTE — Assessment & Plan Note (Signed)
No evidence of chf or hypothyroid  Or renal dz  - d dimer very high and likely false pos but in setting of underlying malignancy will need CTa to r/o PE.  Discussed in detail all the  indications, usual  risks and alternatives  relative to the benefits with patient who agrees to proceed with w/u as outlined.     I had an extended discussion with the patient and reviewed all relevant studies so total time was 30 minutes with moderate level of MDM.  Each maintenance medication was reviewed in detail including most importantly the difference between maintenance and prns and under what circumstances the prns are to be triggered using an action plan format that is not reflected in the computer generated alphabetically organized AVS.     Please see AVS for specific instructions unique to this visit that I personally wrote and verbalized to the the pt in detail and then reviewed with pt  by my nurse highlighting any  changes in therapy recommended at today's acute visit to their plan of care.

## 2020-07-05 ENCOUNTER — Other Ambulatory Visit (HOSPITAL_COMMUNITY): Payer: Self-pay | Admitting: Student

## 2020-07-05 ENCOUNTER — Ambulatory Visit (HOSPITAL_COMMUNITY)
Admission: RE | Admit: 2020-07-05 | Discharge: 2020-07-05 | Disposition: A | Discharge status: Home / Self Care | Payer: PPO | Source: Ambulatory Visit | Attending: Student | Admitting: Student

## 2020-07-05 ENCOUNTER — Other Ambulatory Visit (HOSPITAL_COMMUNITY): Payer: PPO

## 2020-07-05 ENCOUNTER — Ambulatory Visit (HOSPITAL_COMMUNITY)
Admission: RE | Admit: 2020-07-05 | Discharge: 2020-07-05 | Disposition: A | Discharge status: Home / Self Care | Payer: PPO | Source: Ambulatory Visit | Attending: Internal Medicine | Admitting: Internal Medicine

## 2020-07-05 ENCOUNTER — Other Ambulatory Visit: Payer: Self-pay

## 2020-07-05 DIAGNOSIS — J90 Pleural effusion, not elsewhere classified: Secondary | ICD-10-CM

## 2020-07-05 DIAGNOSIS — Z9889 Other specified postprocedural states: Secondary | ICD-10-CM

## 2020-07-05 DIAGNOSIS — J948 Other specified pleural conditions: Secondary | ICD-10-CM | POA: Diagnosis not present

## 2020-07-05 HISTORY — PX: IR THORACENTESIS RIGHT ASP PLEURAL SPACE W/IMG GUIDE: IMG5380

## 2020-07-05 LAB — BODY FLUID CELL COUNT WITH DIFFERENTIAL
Eos, Fluid: 0 %
Lymphs, Fluid: 9 %
Monocyte-Macrophage-Serous Fluid: 90 % (ref 50–90)
Neutrophil Count, Fluid: 1 % (ref 0–25)
Total Nucleated Cell Count, Fluid: 251 cu mm (ref 0–1000)

## 2020-07-05 LAB — LACTATE DEHYDROGENASE, PLEURAL OR PERITONEAL FLUID: LD, Fluid: 152 U/L — ABNORMAL HIGH (ref 3–23)

## 2020-07-05 LAB — ALBUMIN, PLEURAL OR PERITONEAL FLUID: Albumin, Fluid: 2 g/dL

## 2020-07-05 LAB — PROTEIN, PLEURAL OR PERITONEAL FLUID: Total protein, fluid: <3 g/dL

## 2020-07-05 MED ORDER — HYDROCODONE-HOMATROPINE 5-1.5 MG/5ML PO SYRP: 5.0000 mL | ORAL_SOLUTION | ORAL | 0 refills | Status: DC | PRN

## 2020-07-05 MED ORDER — LIDOCAINE HCL 1 % IJ SOLN
INTRAMUSCULAR | Status: AC
  Filled 2020-07-05: qty 20

## 2020-07-05 NOTE — Telephone Encounter (Signed)
Done

## 2020-07-05 NOTE — Telephone Encounter (Signed)
Dr Melvyn Novas- please advise on pt email regarding hycodan rx, thanks   [place], [facility]  Hi Dr. Melvyn Novas, My [facility] doesn't have my cough medicine prescription, and they don't know when they'll get it.  The only pharmacy in my area that has it is [facility], [address] [place].  The phone # is [phone]. Would you be able to send them my prescription?   Thank you!  Jule Ser

## 2020-07-05 NOTE — Procedures (Signed)
PROCEDURE SUMMARY:  Successful US guided diagnostic and therapeutic right thoracentesis. Yielded 800 mL of amber fluid. Pt tolerated procedure well. No immediate complications.  Specimen was sent for labs. CXR ordered.  EBL < 5 mL  Docia Barrier PA-C 07/05/2020 10:35 AM

## 2020-07-05 NOTE — Telephone Encounter (Signed)
See result notes. 

## 2020-07-06 LAB — TRIGLYCERIDES, BODY FLUIDS: Triglycerides, Fluid: 25 mg/dL

## 2020-07-07 ENCOUNTER — Telehealth: Payer: Self-pay | Admitting: Internal Medicine

## 2020-07-07 LAB — CYTOLOGY - NON PAP

## 2020-07-07 NOTE — Telephone Encounter (Signed)
Too complex to answer all the questions on the phone - would set up ov next week when all the labs from effusion will be available and we can discuss the thyroid nodule then but that is definitely a back burner issue - no new rx needed in meantime

## 2020-07-07 NOTE — Telephone Encounter (Signed)
Spoke with the pt and notified of response per Dr Melvyn Novas. She verbalized understanding. Appt with MW for 07/12/20 at 11:45 am.

## 2020-07-07 NOTE — Telephone Encounter (Signed)
Called and spoke with patient, she has some questions regarding the results of her CT angio and CXR that she saw on mychart.  She does not understand why she still has the cough and what is causing the cough.  One of the tests showed a nodule on her thyroid and recommended follow up.  One test also showed ground glass air space disease and she wants to know what that means.  She wants to know if she still has pna and when she needs to follow up in the office with Dr. Melvyn Novas.  Dr. Melvyn Novas, please advise.  Thank you.

## 2020-07-10 LAB — CHOLESTEROL, BODY FLUID: Cholesterol, Fluid: 54 mg/dL

## 2020-07-12 ENCOUNTER — Encounter: Payer: Self-pay | Admitting: Internal Medicine

## 2020-07-12 ENCOUNTER — Other Ambulatory Visit: Payer: Self-pay

## 2020-07-12 ENCOUNTER — Ambulatory Visit: Payer: PPO | Admitting: Internal Medicine

## 2020-07-12 ENCOUNTER — Ambulatory Visit (INDEPENDENT_AMBULATORY_CARE_PROVIDER_SITE_OTHER): Payer: PPO

## 2020-07-12 VITALS — BP 120/72 | HR 97 | Temp 98.6°F | Ht 66.5 in | Wt 159.8 lb

## 2020-07-12 DIAGNOSIS — R053 Chronic cough: Secondary | ICD-10-CM | POA: Diagnosis not present

## 2020-07-12 DIAGNOSIS — J90 Pleural effusion, not elsewhere classified: Secondary | ICD-10-CM | POA: Diagnosis not present

## 2020-07-12 DIAGNOSIS — J9811 Atelectasis: Secondary | ICD-10-CM | POA: Diagnosis not present

## 2020-07-12 MED ORDER — METHYLPREDNISOLONE ACETATE 80 MG/ML IJ SUSP
120.0000 mg | Freq: Once | INTRAMUSCULAR | Status: AC
Start: 2020-07-12 — End: 2020-07-12
  Administered 2020-07-12: 120 mg via INTRAMUSCULAR

## 2020-07-12 MED ORDER — SPIRONOLACTONE-HCTZ 25-25 MG PO TABS: ORAL_TABLET | ORAL | 11 refills | Status: DC

## 2020-07-12 MED ORDER — GABAPENTIN 100 MG PO CAPS: 100.0000 mg | ORAL_CAPSULE | Freq: Three times a day (TID) | ORAL | 2 refills | Status: DC

## 2020-07-12 NOTE — Patient Instructions (Addendum)
Aldactazide  25/25 one twice daily   Gabapentin 100 mg three times a week and if not better after a week take 2 three times daily   Return in one week for blood test and I will call you the results   Please schedule a follow up office visit in 2  weeks, sooner if needed   Late add: also rec  Try prilosec otc 20mg   Take 30-60 min before first meal of the day and Pepcid ac (famotidine) 20 mg one @  bedtime until cough is completely gone for at least a week without the need for cough suppression

## 2020-07-12 NOTE — Progress Notes (Signed)
Nicole Campbell, female    DOB: 1946-03-29,    MRN: [id number]   Brief patient profile:  25 yowf never smoker with RA onset 2016 while in [place] and moved to [place] rx mtx and various other meds [name] then noted elevated wbc Nov 2019 dx CLL observation rx intially then Rituxan started 07/2018 which helped the CLL > RA symptoms tried mtx again >> "horrible low back pain" with CT 12/12/2018 neg for pl effusion or ILD  better p pred rx then tapered off by Feb 1st covid shot Feb 12th, then feb 20th 2021 new dry cough indolent onset present daily > March 12 at [place] [facility] cxr showed fluid rec no rx per pt   referred to pulmonary clinic 08/07/2019 by Dr   [name]  2nd shot March 7th 2021 > rx 10 days abx for ? Infection at site  = doxycycline   History of Present Illness  08/07/2019  Pulmonary/ 1st office eval/Nicole Campbell RA pt with effusion but RA symptoms are better  Chief Complaint  Patient presents with  . Consult    Referred by Dr. [name] for pleural effusion and cough. Non productive cough.   Dyspnea:  No MMRC2 = can't walk a nl pace on a flat grade s sob but does fine slow and flat [employer] Cough: dry/ worse with talking/ laughing  Sleep: avg 45 degrees x 6 months due to breathing better  SABA use: none Worse than usual swelling R > L first noted in 2016  rec Please remember to go to the lab and x-ray department   for your tests - we will call you with the results when they are available. I'll set up follow up after review of the above.  Add:  No obvious dx with bilateral effusions likely worse on L > proceed with dx/therapeutic L t centesis  > pos for lymphoma     07/01/2020    ov/Nicole Campbell re: still 3 days left on levaquin, mucus is clear  Was slt green prior o levaquin Chief Complaint  Patient presents with  . Acute Visit    Had covid 19 end of Dec 2021- She states has had cough since then. She has had SOB over the past 2 wks- currently on levaquin 500 per PCP for PNA. Her  cough is occ prod with clear to yellow sputum.    Dyspnea: inside house, no grocery in over a week  Cough: rattling now mucus Sleeping: almost upright x 2 m SABA use: none  02: none  Covid status:   vax x 3 last  12/29/19  rec For cough > hydromet  1-2 tsp every 4-6 hours Please remember to go to the lab and x-ray department  for your tests - we will call you with the results when they are available.   07/12/2020  f/u ov/Nicole Campbell re: bilteral  effusion/ cough x since covid dec 2021  Back on ambrulac  Chief Complaint  Patient presents with  . Follow-up    Cough  Dyspnea:  Grocery shopping easier , can take deeper breaths but then this makes her cough Cough: clear mucus from nose, no longer green, nonstop daytime cough no better with hycodan  Sleeping:  having to sit almost upright due to cough, not sob  SABA use: none  02: none  Covid status:  All 3 vaccines  Assoc R > L leg swelling    No obvious day to day or daytime variability or assoc  purulent  sputum or mucus plugs or hemoptysis or cp or chest tightness, subjective wheeze or overt sinus or hb symptoms.     Also denies any obvious fluctuation of symptoms with weather or environmental changes or other aggravating or alleviating factors except as outlined above   No unusual exposure hx or h/o childhood pna/ asthma or knowledge of premature birth.  Current Allergies, Complete Past Medical History, Past Surgical History, Family History, and Social History were reviewed in [employer] record.  ROS  The following are not active complaints unless bolded Hoarseness, sore throat, dysphagia, dental problems, itching, sneezing,  nasal congestion or discharge of excess mucus or purulent secretions, ear ache,   fever, chills, sweats, unintended wt loss or wt gain, classically pleuritic or exertional cp,  orthopnea pnd or arm/hand swelling  or leg swelling, presyncope, palpitations, abdominal pain, anorexia, nausea,  vomiting, diarrhea  or change in bowel habits or change in bladder habits, change in stools or change in urine, dysuria, hematuria,  rash, arthralgias, visual complaints, headache, numbness, weakness or ataxia or problems with walking or coordination,  change in mood or  memory.        Current Meds  Medication Sig  . HYDROcodone-homatropine (HYCODAN) 5-1.5 MG/5ML syrup Take 5 mLs by mouth every 4 (four) hours as needed for cough.  . IMBRUVICA 420 MG TABS TAKE 1 TABLET (420 MG) BY MOUTH DAILY.  Marland Kitchen levothyroxine (SYNTHROID, LEVOTHROID) 25 MCG tablet Take 25 mcg by mouth daily before breakfast.                  Past Medical History:  Diagnosis Date  . CLL (chronic lymphocytic leukemia) ([place])   . Hypertension   . Hypothyroid   . MVP (mitral valve prolapse)   . Rheumatoid arthritis ([facility])          Objective:     07/12/2020        159  07/01/20 163 lb 6.4 oz (74.1 kg)  05/20/20 164 lb (74.4 kg)  03/17/20 163 lb (73.9 kg)     Vital signs reviewed  07/12/2020  - Note at rest 02 sats  94% on RA   General appearance:    Chronically ill amb wf dry sounding cough     HEENT : pt wearing mask not removed for exam due to covid -19 concerns.    NECK :  without JVD/Nodes/TM/ nl carotid upstrokes bilaterally   LUNGS: no acc muscle use,  Nl contour chest with decreased bs both bases and dry cough on inspiration  CV:  RRR  no s3 or murmur or increase in P2, and R > L pitting edema despite elastic hose  ABD:  soft and nontender with nl inspiratory excursion in the supine position. No bruits or organomegaly appreciated, bowel sounds nl  MS:  Nl gait/ ext warm without deformities, calf tenderness, cyanosis or clubbing No obvious joint restrictions   SKIN: warm and dry without lesions    NEURO:  alert, approp, nl sensorium with  no motor or cerebellar deficits apparent.     CXR PA and Lateral:   07/12/2020 :    I personally reviewed images   impression as follows:    better  aeration vs priors, residual infiltrates LUL                    Assessment

## 2020-07-13 ENCOUNTER — Telehealth: Payer: Self-pay | Admitting: *Deleted

## 2020-07-13 ENCOUNTER — Ambulatory Visit: Payer: PPO | Admitting: Internal Medicine

## 2020-07-13 ENCOUNTER — Encounter: Payer: Self-pay | Admitting: Internal Medicine

## 2020-07-13 ENCOUNTER — Encounter: Payer: Self-pay | Admitting: *Deleted

## 2020-07-13 DIAGNOSIS — R053 Chronic cough: Secondary | ICD-10-CM | POA: Insufficient documentation

## 2020-07-13 NOTE — Telephone Encounter (Signed)
Spoke with the pt and notified of recommendations per Dr Melvyn Novas She verbalized understanding  Nothing further needed

## 2020-07-13 NOTE — Telephone Encounter (Signed)
-----   Message from Tanda Rockers, MD sent at 07/13/2020  8:18 AM EDT ----- Call pt:    In additional to gabapentin I also strongly rec for bad coughing :  Try prilosec otc 20mg   Take 30-60 min before first meal of the day and Pepcid ac (famotidine) 20 mg one @  bedtime until cough is completely gone for at least a week without the need for cough suppression

## 2020-07-13 NOTE — Assessment & Plan Note (Signed)
Onset of symptoms Feb 2021  -R thoracentesis 08/12/2019 :  1 liter   Exudative, nl glucose,  11k lymphs > flow cyt c/w lymphoma > referred back to oncology and hold off on R tap as feeling much better as of 08/14/2019  - venous dopplers rec due to D dimer 0.95 >>>  08/14/2019 neg bilaterally  - R Tcentesis 07/05/2020 :  800 cc: neg for lymphoma/ more transudative features though borderline  ddx = vol overload since has new bilateral edema so try aldactazide 25/25 bid and f/u one week with labs, 2 weeks with f/u ov with cxr and continue rx for lymphoma unless contraindicated by pulmonary status

## 2020-07-13 NOTE — Assessment & Plan Note (Addendum)
Onset with covid dec 2021  - trial of gabapentin 100 mg tid 07/12/2020  Plus gerd rx   The cough with deep breathing is typical of that assoc with effusions and parenchymal lung dz the latter of which may still represent fp dz from either covid or HCAP already treated.  rec  rx effusions as above Depomedrol 120 mg IM    Gabapentin trial plus gerd rx  F/u q 2 weeks          Each maintenance medication was reviewed in detail including emphasizing most importantly the difference between maintenance and prns and under what circumstances the prns are to be triggered using an action plan format where appropriate.  Total time for H and P, chart review, counseling, reviewing   and generating customized AVS unique to this office visit / same day charting = 25 min

## 2020-07-13 NOTE — Telephone Encounter (Signed)
Dr Melvyn Novas, We received this mychart message from patient. Please advise.  Hi Dr. Melvyn Novas, I was wondering if I could hold off on taking the Gabapentin, because I noticed since I had the cortisone shot in your office yesterday, the 14th, my coughing has started to improve.  Not only that, but the swelling in my right foot and ankle has decreased.  I would like to give the cortisone more time to see if my coughing improves even more.  To me, the Gabapentin side effects out way the benefits, especially since I feel like I'm improving.  I'm still going to take the Aldactazide for my swelling. Thank You,  Nicole Campbell

## 2020-07-22 ENCOUNTER — Inpatient Hospital Stay: Payer: PPO | Attending: Oncology

## 2020-07-22 ENCOUNTER — Other Ambulatory Visit (INDEPENDENT_AMBULATORY_CARE_PROVIDER_SITE_OTHER): Payer: PPO

## 2020-07-22 ENCOUNTER — Other Ambulatory Visit: Payer: Self-pay

## 2020-07-22 ENCOUNTER — Inpatient Hospital Stay: Payer: PPO | Admitting: Oncology

## 2020-07-22 VITALS — BP 128/75 | HR 101 | Temp 97.5°F | Resp 16 | Ht 66.5 in | Wt 147.0 lb

## 2020-07-22 DIAGNOSIS — J90 Pleural effusion, not elsewhere classified: Secondary | ICD-10-CM | POA: Insufficient documentation

## 2020-07-22 DIAGNOSIS — Z8616 Personal history of COVID-19: Secondary | ICD-10-CM | POA: Diagnosis not present

## 2020-07-22 DIAGNOSIS — C9110 Chronic lymphocytic leukemia of B-cell type not having achieved remission: Secondary | ICD-10-CM

## 2020-07-22 DIAGNOSIS — M069 Rheumatoid arthritis, unspecified: Secondary | ICD-10-CM | POA: Diagnosis not present

## 2020-07-22 DIAGNOSIS — Z79899 Other long term (current) drug therapy: Secondary | ICD-10-CM | POA: Diagnosis not present

## 2020-07-22 LAB — CBC WITH DIFFERENTIAL (CANCER CENTER ONLY)
Abs Immature Granulocytes: 0.18 10*3/uL — ABNORMAL HIGH (ref 0.00–0.07)
Basophils Absolute: 0.1 10*3/uL (ref 0.0–0.1)
Basophils Relative: 1 %
Eosinophils Absolute: 0.1 10*3/uL (ref 0.0–0.5)
Eosinophils Relative: 0 %
HCT: 36.8 % (ref 36.0–46.0)
Hemoglobin: 11.6 g/dL — ABNORMAL LOW (ref 12.0–15.0)
Immature Granulocytes: 2 %
Lymphocytes Relative: 16 %
Lymphs Abs: 1.9 10*3/uL (ref 0.7–4.0)
MCH: 25.2 pg — ABNORMAL LOW (ref 26.0–34.0)
MCHC: 31.5 g/dL (ref 30.0–36.0)
MCV: 80 fL (ref 80.0–100.0)
Monocytes Absolute: 1.7 10*3/uL — ABNORMAL HIGH (ref 0.1–1.0)
Monocytes Relative: 14 %
Neutro Abs: 8 10*3/uL — ABNORMAL HIGH (ref 1.7–7.7)
Neutrophils Relative %: 67 %
Platelet Count: 441 10*3/uL — ABNORMAL HIGH (ref 150–400)
RBC: 4.6 MIL/uL (ref 3.87–5.11)
RDW: 16.4 % — ABNORMAL HIGH (ref 11.5–15.5)
WBC Count: 11.9 10*3/uL — ABNORMAL HIGH (ref 4.0–10.5)
nRBC: 0 % (ref 0.0–0.2)

## 2020-07-22 LAB — CMP (CANCER CENTER ONLY)
ALT: 13 U/L (ref 0–44)
AST: 13 U/L — ABNORMAL LOW (ref 15–41)
Albumin: 3.3 g/dL — ABNORMAL LOW (ref 3.5–5.0)
Alkaline Phosphatase: 94 U/L (ref 38–126)
Anion gap: 11 (ref 5–15)
BUN: 22 mg/dL (ref 8–23)
CO2: 27 mmol/L (ref 22–32)
Calcium: 9.5 mg/dL (ref 8.9–10.3)
Chloride: 95 mmol/L — ABNORMAL LOW (ref 98–111)
Creatinine: 0.82 mg/dL (ref 0.44–1.00)
GFR, Estimated: >60 mL/min (ref >60)
Glucose, Bld: 100 mg/dL — ABNORMAL HIGH (ref 70–99)
Potassium: 4.2 mmol/L (ref 3.5–5.1)
Sodium: 133 mmol/L — ABNORMAL LOW (ref 135–145)
Total Bilirubin: 0.9 mg/dL (ref 0.3–1.2)
Total Protein: 6.3 g/dL — ABNORMAL LOW (ref 6.5–8.1)

## 2020-07-22 LAB — BASIC METABOLIC PANEL
BUN: 24 mg/dL — ABNORMAL HIGH (ref 6–23)
CO2: 30 mEq/L (ref 19–32)
Calcium: 9.6 mg/dL (ref 8.4–10.5)
Chloride: 94 mEq/L — ABNORMAL LOW (ref 96–112)
Creatinine, Ser: 0.9 mg/dL (ref 0.40–1.20)
GFR: 63.43 mL/min (ref >60.00)
Glucose, Bld: 102 mg/dL — ABNORMAL HIGH (ref 70–99)
Potassium: 4.2 mEq/L (ref 3.5–5.1)
Sodium: 131 mEq/L — ABNORMAL LOW (ref 135–145)

## 2020-07-22 LAB — HEPATIC FUNCTION PANEL
ALT: 15 U/L (ref 0–35)
AST: 18 U/L (ref 0–37)
Albumin: 3.9 g/dL (ref 3.5–5.2)
Alkaline Phosphatase: 86 U/L (ref 39–117)
Bilirubin, Direct: 0.2 mg/dL (ref 0.0–0.3)
Total Bilirubin: 0.8 mg/dL (ref 0.2–1.2)
Total Protein: 6.5 g/dL (ref 6.0–8.3)

## 2020-07-22 NOTE — Progress Notes (Signed)
Hematology and Oncology Follow Up Visit  Joelene Barriere [id number] 03/07/1946 75 y.o. 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  She was found to have p53 deletion, deletion of ATM and lymphocytosis.  Prior therapy:   She is status post rituximab weekly for 4 weeks completed on August 21, 2018. She is status post thoracentesis completed on August 12, 2019 with positive cytology. Rituximab 375 mg per metered square weekly started on October 13 of 2020 and completed on 03/04/2019.  This will be repeated every 3 months last treatment completed in March 2021. Bendamustine and rituximab started on Sep 03, 2019.  She is here for cycle 3 of therapy.  Current therapy: Ibrutinib 420 mg daily started in July 2020.      Interim History: Ms. Marseille is here for a follow-up visit.  Since the last visit, she developed worsening symptoms of shortness of breath and recurrent pleural effusion that required thoracentesis on March 7 under the care of Dr. Shyrl Numbers from pulmonary medicine.  She was prescribed Neurontin as well for her symptoms.  Since that time, she reports feeling better slowly with her cough improved on Neurontin.  She denies any shortness of breath and dyspnea on exertion but have lost significant weight.  She is gaining it that back at this time fairly slowly.  She is eating better and using nutritional supplements. She has tolerated ibrutinib without any new complaints at this time.  She denies any bleeding complications or palpitation.          .    Medications: Updated on review. Current Outpatient Medications  Medication Sig Dispense Refill  . gabapentin (NEURONTIN) 100 MG capsule Take 1 capsule (100 mg total) by mouth 3 (three) times daily. One three times daily 90 capsule 2  . IMBRUVICA 420 MG TABS TAKE 1 TABLET (420 MG) BY MOUTH DAILY. 28 tablet 0  . levothyroxine (SYNTHROID, LEVOTHROID) 25  MCG tablet Take 25 mcg by mouth daily before breakfast.     . spironolactone-hydrochlorothiazide (ALDACTAZIDE) 25-25 MG tablet One twice daily 60 tablet 11   No current facility-administered medications for this visit.     Allergies:  Allergies  Allergen Reactions  . Methotrexate     Other reaction(s): horrible lower back pain  . Erythromycin Palpitations        Physical Exam:       Blood pressure 128/75, pulse (!) 101, temperature (!) 97.5 F (36.4 C), temperature source Tympanic, resp. rate 16, height 5' 6.5" (1.689 m), weight 147 lb (66.7 kg), SpO2 100 %.      ECOG:1      General appearance: Alert, awake without any distress. Head: Atraumatic without abnormalities Oropharynx: Without any thrush or ulcers. Eyes: No scleral icterus. Lymph nodes: No lymphadenopathy noted in the cervical, supraclavicular, or axillary nodes Heart:regular rate and rhythm, without any murmurs or gallops.   Lung: Clear to auscultation without any rhonchi, wheezes or dullness to percussion. Abdomin: Soft, nontender without any shifting dullness or ascites. Musculoskeletal: No clubbing or cyanosis. Neurological: No motor or sensory deficits. Skin: No rashes or lesions.                      Lab Results: Lab Results  Component Value Date   WBC 8.5 07/01/2020   HGB 10.9 (L) 07/01/2020   HCT 33.3 (L) 07/01/2020   MCV 81.1 07/01/2020   PLT 381.0 07/01/2020  Chemistry      Component Value Date/Time   NA 136 07/01/2020 1529   K 4.7 07/01/2020 1529   CL 101 07/01/2020 1529   CO2 28 07/01/2020 1529   BUN 9 07/01/2020 1529   CREATININE 0.67 07/01/2020 1529   CREATININE 0.78 05/20/2020 1452      Component Value Date/Time   CALCIUM 8.6 07/01/2020 1529   ALKPHOS 87 07/01/2020 1529   AST 19 07/01/2020 1529   AST 26 05/20/2020 1452   ALT 12 07/01/2020 1529   ALT 22 05/20/2020 1452   BILITOT 0.7 07/01/2020 1529   BILITOT 0.6 05/20/2020 1452       IMPRESSION: 1. No evidence of pulmonary embolus. 2. Large bilateral pleural effusions, right greater than left. 3. Ground-glass airspace disease within the left upper and left lower lobes, unchanged since recent chest x-ray, consistent with pneumonia. 4. Significant compressive atelectasis primarily within the lower lobes. 5. Interval resolution of the diffuse intrathoracic adenopathy seen previously. 6. Nonspecific 1.6 cm right lobe thyroid nodule, not significantly changed since prior exams. Recommend thyroid US if not previously performed. (Ref: J Am Coll Radiol. 2015 Feb;12(2): 143-50). 7.  Aortic Atherosclerosis (ICD10-I70.0). Impression and Plan:   75 year old woman with:  1.  CLL presented with lymphadenopathy and lymphocytosis diagnosed in November 2019.     She has tolerated ibrutinib fairly well without any major complications with excellent response to her therapy including normalization of her white cell count and lymphadenopathy.  CT scan obtained on March 4 showed large bilateral pleural effusion with right more than left but no evidence of lymphadenopathy.  Thoracentesis did not show any malignant effusion at this time.  At this time, I recommended continuing ibrutinib and she is in agreement.  We will continue to monitor her counts.  Laboratory testing from today showed overall consistent findings.   2.  Rheumatoid arthritis: She is off treatment at this time and no exacerbation.   3.  Pulmonary consideration recurrent pleural effusion: Unclear etiology at this time and could be malignant in nature.  She has responded very well to improvement with response in her lymphadenopathy and white cell count and is difficult to attribute this pleural effusion to worsening CLL.  It is possible that her pulmonary status has been exacerbated by recent COVID-19 infection.  4.  Covid considerations: He is up-to-date on her immunizations as well as recent infection.  5.   Follow-up: In 2 months for a follow-up visit.   30  minutes were spent on this encounter.  Time was dedicated to reviewing disease status, discussing treatment options and future plan of care reviewed.   Zola Button, MD 3/24/20221:13 PM

## 2020-07-23 ENCOUNTER — Other Ambulatory Visit: Payer: Self-pay | Admitting: Oncology

## 2020-07-23 NOTE — Telephone Encounter (Signed)
Received the following message:   "Hi Dr. Melvyn Novas, the new medicines I'm on have helped greatly, but unfortunately I'm having side effects to the point where I feel I need to cut down the dosage.  The 2 together make me very sleepy, and I get dizzy and sometimes off balance upon standing.  Also, my eyes have become extremely dry and irritated.  Several days ago, I took it upon myself to cut back the Gabapentin to twice a day instead of 3.  So far, it's still working, but the side effects haven't decreased much.  I would like to cut back to 1 pill each med per day until I see you on the 30th.  Would this be ok?  Also, I took my blood pressure this morning with my own monitor and it averaged 105/71.  Is that too low? Thank You! Nicole Campbell"  MW, please advise. Thanks.

## 2020-07-26 MED FILL — IMBRUVICA 420 MG TAB: 420 | 28 days supply | Qty: 28 | Fill #0

## 2020-07-27 NOTE — Telephone Encounter (Signed)
Dr Melvyn Novas- please advise on pt email, thanks   Ahmed Prima, Hassan Buckler  P [facility] Pool Hi Dr. Melvyn Novas, are Dulcolax Chews safe to use with HCTZ. They're stimulant free, but each "chew"contains magnesium hydroxide 1200 mg, saline laxative. It would be a one time use.  Thank you,  Jule Ser

## 2020-07-28 ENCOUNTER — Other Ambulatory Visit: Payer: Self-pay

## 2020-07-28 ENCOUNTER — Ambulatory Visit (INDEPENDENT_AMBULATORY_CARE_PROVIDER_SITE_OTHER): Payer: PPO

## 2020-07-28 ENCOUNTER — Ambulatory Visit: Payer: PPO | Admitting: Internal Medicine

## 2020-07-28 ENCOUNTER — Encounter: Payer: Self-pay | Admitting: Internal Medicine

## 2020-07-28 DIAGNOSIS — J90 Pleural effusion, not elsewhere classified: Secondary | ICD-10-CM | POA: Diagnosis not present

## 2020-07-28 DIAGNOSIS — R053 Chronic cough: Secondary | ICD-10-CM | POA: Diagnosis not present

## 2020-07-28 DIAGNOSIS — J9811 Atelectasis: Secondary | ICD-10-CM | POA: Diagnosis not present

## 2020-07-28 NOTE — Progress Notes (Signed)
Nicole Campbell, female    DOB: 1946-03-16,    MRN: [id number]   Brief patient profile:  25 yowf never smoker with RA onset 2016 while in [place] and moved to [place] rx mtx and various other meds Nicole Campbell then noted elevated wbc Nov 2019 dx CLL observation rx intially then Rituxan started 07/2018 which helped the CLL > RA symptoms tried mtx again >> "horrible low back pain" with CT 12/12/2018 neg for pl effusion or ILD  better p pred rx then tapered off by Feb 1st covid shot Feb 12th, then feb 20th 2021 new dry cough indolent onset present daily > March 12 at [place] cxr showed fluid rec no rx per pt   referred to pulmonary clinic 08/07/2019 by Dr   Nicole Campbell  2nd shot March 7th 2021 > rx 10 days abx for ? Infection at site  = doxycycline   History of Present Illness  08/07/2019  Pulmonary/ 1st office eval/Nicole Campbell RA pt with effusion but RA symptoms are better  Chief Complaint  Patient presents with  . Consult    Referred by Dr. Carol Campbell for pleural effusion and cough. Non productive cough.   Dyspnea:  No MMRC2 = can't walk a nl pace on a flat grade s sob but does fine slow and flat [employer] Cough: dry/ worse with talking/ laughing  Sleep: avg 45 degrees x 6 months due to breathing better  SABA use: none Worse than usual swelling R > L first noted in 2016  rec Please remember to go to the lab and x-ray department   for your tests - we will call you with the results when they are available. I'll set up follow up after review of the above.  Add:  No obvious dx with bilateral effusions likely worse on L > proceed with dx/therapeutic L t centesis  > pos for lymphoma     07/01/2020    ov/Nicole Campbell re: still 3 days left on levaquin, mucus is clear  Was slt green prior o levaquin Chief Complaint  Patient presents with  . Acute Visit    Had covid 19 end of Dec 2021- She states has had cough since then. She has had SOB over the past 2 wks- currently on levaquin 500 per PCP for PNA. Her  cough is occ prod with clear to yellow sputum.    Dyspnea: inside house, no grocery in over a week  Cough: rattling now mucus Sleeping: almost upright x 2 m SABA use: none  02: none  Covid status:   vax x 3 last  12/29/19  rec For cough > hydromet  1-2 tsp every 4-6 hours Please remember to go to the lab and x-ray department  for your tests - we will call you with the results when they are available.   07/12/2020  f/u ov/Nicole Campbell Will re: bilteral  effusion/ cough x since covid dec 2021  Back on Imbruvica   Chief Complaint  Patient presents with  . Follow-up    Cough  Dyspnea:  Grocery shopping easier , can take deeper breaths but then this makes her cough Cough: clear mucus from nose, no longer green, nonstop daytime cough no better with hycodan  Sleeping:  having to sit almost upright due to cough, not sob  SABA use: none  02: none  Covid status:  All 3 vaccines  Assoc R > L leg swelling  rec Aldactazide  25/25 one twice daily  Gabapentin 100 mg three times  a week and if not better after a week take 2 three times daily  Late add: also rec  Try prilosec otc 20mg   Take 30-60 min before first meal of the day and Pepcid ac (famotidine) 20 mg one @  Bedtime > never started     07/28/2020  f/u ov/Nicole Campbell re: bilateral effusions reduced the the aldactazide to one daily / now on gabapentin 100 mg one daily x 4-5 days  Chief Complaint  Patient presents with  . Follow-up    Feeling better 95%, no cough, decreased ankle edema  Dyspnea:  Grocery shopping / parking lot with sats around 90%  Cough: gone  Sleeping:  45 degrees in recliner x 2 years  SABA use: inhalers 02: none  Covid status:   vax x 3 pfizer  Leg swelling resolved  Drinking lots of water on hctz/ advised to use hard rock candies for dry mouth    No obvious day to day or daytime variability or assoc excess/ purulent sputum or mucus plugs or hemoptysis or cp or chest tightness, subjective wheeze or overt sinus or hb symptoms.    Sleeping without nocturnal  or early am exacerbation  of respiratory  c/o's or need for noct saba. Also denies any obvious fluctuation of symptoms with weather or environmental changes or other aggravating or alleviating factors except as outlined above   No unusual exposure hx or h/o childhood pna/ asthma or knowledge of premature birth.  Current Allergies, Complete Past Medical History, Past Surgical History, Family History, and Social History were reviewed in [employer] record.  ROS  The following are not active complaints unless bolded Hoarseness, sore throat, dysphagia, dental problems, itching, sneezing,  nasal congestion or discharge of excess mucus or purulent secretions, ear ache,   fever, chills, sweats, unintended wt loss or wt gain, classically pleuritic or exertional cp,  orthopnea pnd or arm/hand swelling  or leg swelling, presyncope, palpitations, abdominal pain, anorexia, nausea, vomiting, diarrhea  or change in bowel habits or change in bladder habits, change in stools or change in urine, dysuria, hematuria,  rash, arthralgias, visual complaints, headache, numbness, weakness or ataxia or problems with walking or coordination,  change in mood or  memory.        Current Meds  Medication Sig  . gabapentin (NEURONTIN) 100 MG capsule Take 1 capsule (100 mg total) by mouth 3 (three) times daily. One three times daily (Patient taking differently: Take 100 mg by mouth daily. One capsule one time daily)  . IMBRUVICA 420 MG TABS TAKE 1 TABLET (420 MG) BY MOUTH DAILY.  Marland Kitchen levothyroxine (SYNTHROID, LEVOTHROID) 25 MCG tablet Take 25 mcg by mouth daily before breakfast.   . spironolactone-hydrochlorothiazide (ALDACTAZIDE) 25-25 MG tablet One twice daily (Patient taking differently: Take 1 tablet by mouth daily. One twice daily)                            Past Medical History:  Diagnosis Date  . CLL (chronic lymphocytic leukemia) ([place])   . Hypertension    . Hypothyroid   . MVP (mitral valve prolapse)   . Rheumatoid arthritis ([facility])          Objective:    07/28/2020        144 07/12/2020        159  07/01/20 163 lb 6.4 oz (74.1 kg)  05/20/20 164 lb (74.4 kg)  03/17/20 163 lb (73.9 kg)  Vital signs reviewed  07/28/2020  - Note at rest 02 sats  100% on RA   General appearance:    amb pleasant wf nad   HEENT : pt wearing mask not removed for exam due to covid -19 concerns.    NECK :  without JVD/Nodes/TM/ nl carotid upstrokes bilaterally   LUNGS: no acc muscle use,  Nl contour chest with minimal decreased bs/ dullness in basses bilaterally without cough on insp or exp maneuvers   CV:  RRR  no s3 or murmur or increase in P2, and no edema   ABD:  soft and nontender with nl inspiratory excursion in the supine position. No bruits or organomegaly appreciated, bowel sounds nl  MS:  Nl gait/ ext warm without deformities, calf tenderness, cyanosis or clubbing No obvious joint restrictions   SKIN: warm and dry without lesions    NEURO:  Campbell, approp, nl sensorium with  no motor or cerebellar deficits apparent.         CXR PA and Lateral:   07/28/2020 :    I personally reviewed images and agree with radiology impression as follows:    Resolved left upper lobe focal pulmonary infiltrate. Decreasing, small bilateral pleural effusions.         Labs ordered/ reviewed:      Chemistry      Component Value Date/Time   NA 131 (L) 07/22/2020 1402   K 4.2 07/22/2020 1402   CL 94 (L) 07/22/2020 1402   CO2 30 07/22/2020 1402   BUN 24 (H) 07/22/2020 1402   CREATININE 0.90 07/22/2020 1402   CREATININE 0.82 07/22/2020 1314                      Assessment

## 2020-07-28 NOTE — Patient Instructions (Signed)
Use jolly ranchers or lifesavers instead of water    For cough > take gabapentin 100 mg up to 3 x daily as needed   Please remember to go to the  x-ray department  for your tests - we will call you with the results when they are available    Please schedule a follow up visit in 3 months but call sooner if needed

## 2020-07-29 ENCOUNTER — Telehealth: Payer: Self-pay | Admitting: Internal Medicine

## 2020-07-29 ENCOUNTER — Other Ambulatory Visit (HOSPITAL_COMMUNITY): Payer: Self-pay

## 2020-07-29 ENCOUNTER — Encounter: Payer: Self-pay | Admitting: Internal Medicine

## 2020-07-29 DIAGNOSIS — J90 Pleural effusion, not elsewhere classified: Secondary | ICD-10-CM

## 2020-07-29 NOTE — Assessment & Plan Note (Signed)
Onset with covid dec 2021  - trial of gabapentin 100 mg tid 07/12/2020  > resolved   Ok to take prn up to 100 tid if cough recurs now that effusions are better         Each maintenance medication was reviewed in detail including emphasizing most importantly the difference between maintenance and prns and under what circumstances the prns are to be triggered using an action plan format where appropriate.  Total time for H and P, chart review, counseling,  and generating customized AVS unique to this office visit / same day charting = 5min

## 2020-07-29 NOTE — Progress Notes (Signed)
LMTCB

## 2020-07-29 NOTE — Assessment & Plan Note (Addendum)
Onset of symptoms Feb 2021  -R thoracentesis 08/12/2019 :  1 liter   Exudative, nl glucose,  11k lymphs > flow cyt c/w lymphoma > referred back to oncology and hold off on R tap as feeling much better as of 08/14/2019  - venous dopplers rec due to D dimer 0.95 >>>  08/14/2019 neg bilaterally  - R Tcentesis 07/05/2020 :  800 cc: neg for lymphoma/ more transudative features though borderline - 3/24 /22 start Aldactazide  25/25 one twice daily  - repeat cxr 07/28/2020 > improved so continue aldactazide 25/25 one daily   She has already self-titrated down to one aldactazide daily with good labs x her na is trending down likely related to excess water intake so rec continue one daily and recheck bmet in 2 weeks then maybe try qod if cough /sob still well controlled.

## 2020-07-29 NOTE — Telephone Encounter (Signed)
Call pt: Reviewed cxr and improved but fluid still present - would prefer she stay on same diuretic for now but recheck bmet in 2 weeks then consider adjusting down    Called and spoke with pt and she is aware of results per MW>  She is aware to return to the office in about 2 weeks for labs and MW will consider adjusting down diuretic at that time. Pt voiced her understanding.

## 2020-08-03 LAB — FUNGUS CULTURE WITH STAIN

## 2020-08-03 LAB — FUNGAL ORGANISM REFLEX

## 2020-08-03 LAB — FUNGUS CULTURE RESULT

## 2020-08-11 DIAGNOSIS — H04123 Dry eye syndrome of bilateral lacrimal glands: Secondary | ICD-10-CM | POA: Diagnosis not present

## 2020-08-11 DIAGNOSIS — H0288B Meibomian gland dysfunction left eye, upper and lower eyelids: Secondary | ICD-10-CM | POA: Diagnosis not present

## 2020-08-11 DIAGNOSIS — H0288A Meibomian gland dysfunction right eye, upper and lower eyelids: Secondary | ICD-10-CM | POA: Diagnosis not present

## 2020-08-16 ENCOUNTER — Other Ambulatory Visit (INDEPENDENT_AMBULATORY_CARE_PROVIDER_SITE_OTHER): Payer: PPO

## 2020-08-16 DIAGNOSIS — J90 Pleural effusion, not elsewhere classified: Secondary | ICD-10-CM

## 2020-08-16 LAB — BASIC METABOLIC PANEL
BUN: 18 mg/dL (ref 6–23)
CO2: 29 mEq/L (ref 19–32)
Calcium: 8.5 mg/dL (ref 8.4–10.5)
Chloride: 96 mEq/L (ref 96–112)
Creatinine, Ser: 0.75 mg/dL (ref 0.40–1.20)
GFR: 78.9 mL/min (ref >60.00)
Glucose, Bld: 90 mg/dL (ref 70–99)
Potassium: 4.2 mEq/L (ref 3.5–5.1)
Sodium: 131 mEq/L — ABNORMAL LOW (ref 135–145)

## 2020-08-17 ENCOUNTER — Encounter: Payer: Self-pay | Admitting: *Deleted

## 2020-08-17 NOTE — Telephone Encounter (Signed)
Send this message/ arrange f/u  Sorry to hear you're having problems but the medications appears to have helped the fluid problem.  Ok to stop it and also just use hard rock candy for the dry mouth then return next week to see me with a cxr (my nurse will arrange) at end of the week and we'll regroup then

## 2020-08-17 NOTE — Telephone Encounter (Signed)
Please advise on patient mychart message    Hi Dr. Melvyn Novas, I'm having a couple of serious issues since I've been on the Aldactazide.  My eyes have become so irritated, red and dry, to the point I had to see my eye Dr. about it.  He said it's possible it could be due to the medication and suggested I use a certain brand of eye drops for dry eyes which hasn't helped my issue.  Also, for the past week or so, I've been waking up several times at night with an extremely dry mouth and parched tongue, making it hard to swallow.   I'm having to drink water through the night to relieve it.  I also noticed on my blood work from Monday that my GFR # has gone down from my blood work taken back on March 3rd.  My sodium level is below normal as well.  This can't be a good thing.  I really want to stop taking the Aldactazide.  I'm not tolerating it very well. Please advise.  Thank You! Nicole Campbell

## 2020-08-17 NOTE — Telephone Encounter (Signed)
Called and spoke with patient to get her scheduled for appt with Dr. Melvyn Novas. She is now scheduled for 08/26/20 at 2:30. Advised patient to come 10 minutes early to get CXR done before appt. She expressed understanding. Nothing further needed at this time.

## 2020-08-19 ENCOUNTER — Other Ambulatory Visit (HOSPITAL_COMMUNITY): Payer: Self-pay

## 2020-08-19 ENCOUNTER — Other Ambulatory Visit: Payer: Self-pay | Admitting: Oncology

## 2020-08-19 DIAGNOSIS — C9110 Chronic lymphocytic leukemia of B-cell type not having achieved remission: Secondary | ICD-10-CM

## 2020-08-19 MED ORDER — IMBRUVICA 420 MG PO TABS
ORAL_TABLET | ORAL | 0 refills | Status: DC
  Filled 2020-08-19: qty 28, 28d supply, fill #0

## 2020-08-23 ENCOUNTER — Other Ambulatory Visit (HOSPITAL_COMMUNITY): Payer: Self-pay

## 2020-08-26 ENCOUNTER — Ambulatory Visit (INDEPENDENT_AMBULATORY_CARE_PROVIDER_SITE_OTHER): Payer: PPO

## 2020-08-26 ENCOUNTER — Other Ambulatory Visit: Payer: Self-pay

## 2020-08-26 ENCOUNTER — Ambulatory Visit: Payer: PPO | Admitting: Internal Medicine

## 2020-08-26 ENCOUNTER — Encounter: Payer: Self-pay | Admitting: Internal Medicine

## 2020-08-26 VITALS — BP 120/50 | HR 95 | Temp 97.5°F | Ht 66.5 in | Wt 152.6 lb

## 2020-08-26 DIAGNOSIS — J9811 Atelectasis: Secondary | ICD-10-CM | POA: Diagnosis not present

## 2020-08-26 DIAGNOSIS — J90 Pleural effusion, not elsewhere classified: Secondary | ICD-10-CM

## 2020-08-26 DIAGNOSIS — R0600 Dyspnea, unspecified: Secondary | ICD-10-CM

## 2020-08-26 DIAGNOSIS — R053 Chronic cough: Secondary | ICD-10-CM

## 2020-08-26 DIAGNOSIS — R0609 Other forms of dyspnea: Secondary | ICD-10-CM

## 2020-08-26 DIAGNOSIS — R059 Cough, unspecified: Secondary | ICD-10-CM | POA: Diagnosis not present

## 2020-08-26 MED ORDER — HYDROCHLOROTHIAZIDE 25 MG PO TABS: 25.0000 mg | ORAL_TABLET | Freq: Every day | ORAL | 2 refills | Status: DC

## 2020-08-26 NOTE — Patient Instructions (Addendum)
Restart HCTZ  25 mg daily   Use jolly ranchers or lifesavers instead of water    For cough or throat congestion or clearing  > take gabapentin 100 mg up to 3 x daily as needed  You will need to schedule an echocardiogram  Either  With your heart doctor or call me and I will schedule with our   Please schedule a follow up office visit in 4 weeks, sooner if needed with cxr on return

## 2020-08-26 NOTE — Assessment & Plan Note (Signed)
Onset of symptoms Feb 2021  -R thoracentesis 08/12/2019 :  1 liter   Exudative, nl glucose,  11k lymphs > flow cyt c/w lymphoma > referred back to oncology and hold off on R tap as feeling much better as of 08/14/2019  - venous dopplers rec due to D dimer 0.95 >>>  08/14/2019 neg bilaterally  - R Tcentesis 07/05/2020 :  800 cc: neg for lymphoma/ more transudative features though borderline - 3/24 /22 start Aldactazide  25/25 one twice daily  - repeat cxr 07/28/2020 > improved so continue aldactazide 25/25 one daily > "too dry"  - 08/26/2020 recurrent  R > L  efffusions off rx so restart hctz 25 mg daily  - 08/26/2020 rec echo   Discussed in detail all the  indications, usual  risks and alternatives  relative to the benefits with patient who agrees to proceed with w/u and Rx as outlined.

## 2020-08-26 NOTE — Assessment & Plan Note (Signed)
Onset with covid dec 2021  - trial of gabapentin 100 mg tid 07/12/2020  > resolved   Flared off gabapentin but mild > restart gabapentin lowest effective dosing as full doses cause drowsiness           Each maintenance medication was reviewed in detail including emphasizing most importantly the difference between maintenance and prns and under what circumstances the prns are to be triggered using an action plan format where appropriate.  Total time for H and P, chart review, counseling, and generating customized AVS unique to this office visit / same day charting = 35 min

## 2020-08-26 NOTE — Progress Notes (Signed)
Nicole Campbell, female    DOB: August 27, 1945,    MRN: [id number]   Brief patient profile:  2 yowf never smoker with RA onset 2016 while in [place] and moved to [place] rx mtx and various other meds Nicole Campbell then noted elevated wbc Nov 2019 dx CLL observation rx intially then Rituxan started 07/2018 which helped the CLL > RA symptoms tried mtx again >> "horrible low back pain" with CT 12/12/2018 neg for pl effusion or ILD  better p pred rx then tapered off by Feb 1st covid shot Feb 12th, then feb 20th 2021 new dry cough indolent onset present daily > March 12 at [place] cxr showed fluid rec no rx per pt   referred to pulmonary clinic 08/07/2019 by Dr   Nicole Campbell  2nd shot March 7th 2021 > rx 10 days abx for ? Infection at site  = doxycycline   History of Present Illness  08/07/2019  Pulmonary/ 1st office eval/Nicole Campbell RA pt with effusion but RA symptoms are better  Chief Complaint  Patient presents with  . Consult    Referred by Dr. Carol Campbell for pleural effusion and cough. Non productive cough.   Dyspnea:  No MMRC2 = can't walk a nl pace on a flat grade s sob but does fine slow and flat [employer] Cough: dry/ worse with talking/ laughing  Sleep: avg 45 degrees x 6 months due to breathing better  SABA use: none Worse than usual swelling R > L first noted in 2016  rec Please remember to go to the lab and x-ray department   for your tests - we will call you with the results when they are available. I'll set up follow up after review of the above.  Add:  No obvious dx with bilateral effusions likely worse on L > proceed with dx/therapeutic L t centesis  > pos for lymphoma     07/01/2020    ov/Nicole Campbell re: still 3 days left on levaquin, mucus is clear  Was slt green prior o levaquin Chief Complaint  Patient presents with  . Acute Visit    Had covid 19 end of Dec 2021- She states has had cough since then. She has had SOB over the past 2 wks- currently on levaquin 500 per PCP for PNA. Her  cough is occ prod with clear to yellow sputum.    Dyspnea: inside house, no grocery in over a week  Cough: rattling now mucus Sleeping: almost upright x 2 m SABA use: none  02: none  Covid status:   vax x 3 last  12/29/19  rec For cough > hydromet  1-2 tsp every 4-6 hours Please remember to go to the lab and x-ray department  for your tests - we will call you with the results when they are available.   07/12/2020  f/u ov/Nicole Campbell re: bilteral  effusion/ cough x since covid dec 2021  Back on Imbruvica   Chief Complaint  Patient presents with  . Follow-up    Cough  Dyspnea:  Grocery shopping easier , can take deeper breaths but then this makes her cough Cough: clear mucus from nose, no longer green, nonstop daytime cough no better with hycodan  Sleeping:  having to sit almost upright due to cough, not sob  SABA use: none  02: none  Covid status:  All 3 vaccines  Assoc R > L leg swelling  rec Aldactazide  25/25 one twice daily  Gabapentin 100 mg three times  a week and if not better after a week take 2 three times daily  Late add: also rec  Try prilosec otc 20mg   Take 30-60 min before first meal of the day and Pepcid ac (famotidine) 20 mg one @  Bedtime > never started     07/28/2020  f/u ov/Nicole Campbell re: bilateral effusions reduced the the aldactazide to one daily / now on gabapentin 100 mg one daily x 4-5 days  Chief Complaint  Patient presents with  . Follow-up    Feeling better 95%, no cough, decreased ankle edema  Dyspnea:  Grocery shopping / parking lot with sats around 90%  Cough: gone  Sleeping:  45 degrees in recliner x 2 years  SABA use: inhalers 02: none  Covid status:   vax x 3 pfizer  Leg swelling resolved  Drinking lots of water on hctz/ advised to use hard rock candies for dry mouth  rec Use jolly ranchers or lifesavers instead of water   For cough > take gabapentin 100 mg up to 3 x daily as needed    08/26/2020  f/u ov/Nicole Campbell re: bilateral effusions/  St / chills April  3rd pos covid  Chief Complaint  Patient presents with  . Follow-up    Patient wants to talk about Aldactazide medication and go over chest xray.   Dyspnea:   Able to do grocery, cross parking lots  Cough: minimal pnds/ chest congestion but no mucus  Sleeping: 45 degrees = baselie SABA use: none  02: none  Covid status:   Pfizer x 3    No obvious day to day or daytime variability or assoc   purulent sputum or mucus plugs or hemoptysis or cp or chest tightness, subjective wheeze or overt sinus or hb symptoms.     Also denies any obvious fluctuation of symptoms with weather or environmental changes or other aggravating or alleviating factors except as outlined above   No unusual exposure hx or h/o childhood pna/ asthma or knowledge of premature birth.  Current Allergies, Complete Past Medical History, Past Surgical History, Family History, and Social History were reviewed in [employer] record.  ROS  The following are not active complaints unless bolded Hoarseness, sore throat, dysphagia, dental problems, itching, sneezing,  nasal congestion or discharge of excess mucus or purulent secretions, ear ache,   fever, chills, sweats, unintended wt loss or wt gain, classically pleuritic or exertional cp,  orthopnea pnd or arm/hand swelling  or leg swelling, presyncope, palpitations, abdominal pain, anorexia, nausea, vomiting, diarrhea  or change in bowel habits or change in bladder habits, change in stools or change in urine, dysuria, hematuria,  rash, arthralgias, visual complaints, headache, numbness, weakness or ataxia or problems with walking or coordination,  change in mood or  memory.        Current Meds  Medication Sig  . ibrutinib (IMBRUVICA) 420 MG TABS TAKE 1 TABLET (420 MG) BY MOUTH DAILY.  Marland Kitchen levothyroxine (SYNTHROID, LEVOTHROID) 25 MCG tablet Take 25 mcg by mouth daily before breakfast.                            Past Medical History:  Diagnosis Date   . CLL (chronic lymphocytic leukemia) ([place])   . Hypertension   . Hypothyroid   . MVP (mitral valve prolapse)   . Rheumatoid arthritis ([place])          Objective:    08/26/2020  152 07/28/2020        144 07/12/2020        159  07/01/20 163 lb 6.4 oz (74.1 kg)  05/20/20 164 lb (74.4 kg)  03/17/20 163 lb (73.9 kg)    Vital signs reviewed  08/26/2020  - Note at rest 02 sats  97% on RA   General appearance:    amb wf nad       HEENT : pt wearing mask not removed for exam due to covid -19 concerns.    NECK :  without JVD/Nodes/TM/ nl carotid upstrokes bilaterally   LUNGS: no acc muscle use,  Nl contour chest with decrease bs based R> L  bilaterally without cough on insp or exp maneuvers   CV:  RRR  no s3 or murmur or increase in P2, and no edema   ABD:  soft and nontender with nl inspiratory excursion in the supine position. No bruits or organomegaly appreciated, bowel sounds nl  MS:  Nl gait/ ext warm without deformities, calf tenderness, cyanosis or clubbing No obvious joint restrictions   SKIN: warm and dry without lesions    NEURO:  Campbell, approp, nl sensorium with  no motor or cerebellar deficits apparent.         CXR PA and Lateral:   08/26/2020 :    I personally reviewed images and agree with radiology impression as follows:   Enlarging bilateral pleural effusions, larger on the right than the left.        Assessment

## 2020-08-27 ENCOUNTER — Telehealth: Payer: Self-pay | Admitting: Internal Medicine

## 2020-08-27 DIAGNOSIS — R0600 Dyspnea, unspecified: Secondary | ICD-10-CM

## 2020-08-27 DIAGNOSIS — R0609 Other forms of dyspnea: Secondary | ICD-10-CM

## 2020-08-27 NOTE — Telephone Encounter (Signed)
Spoke with the pt and she states called cards and they are not able see her until August 2022  She is wanting [place] to go ahead and order ECHO  Per ov 08/26/20:    Instructions  Restart HCTZ  25 mg daily   Use jolly ranchers or lifesavers instead of water    For cough or throat congestion or clearing  > take gabapentin 100 mg up to 3 x daily as needed  You will need to schedule an echocardiogram  Either  With your heart doctor or call me and I will schedule with our   Please schedule a follow up office visit in 4 weeks, sooner if needed with cxr on return      Order placed for ECHO next available  Nothing further needed

## 2020-09-08 ENCOUNTER — Ambulatory Visit (HOSPITAL_COMMUNITY): Payer: PPO | Attending: Cardiology

## 2020-09-08 ENCOUNTER — Other Ambulatory Visit: Payer: Self-pay

## 2020-09-08 DIAGNOSIS — R0600 Dyspnea, unspecified: Secondary | ICD-10-CM | POA: Diagnosis not present

## 2020-09-08 DIAGNOSIS — R0609 Other forms of dyspnea: Secondary | ICD-10-CM

## 2020-09-08 LAB — ECHOCARDIOGRAM COMPLETE
Area-P 1/2: 4.68 cm2
S' Lateral: 2.8 cm

## 2020-09-09 ENCOUNTER — Encounter: Payer: Self-pay | Admitting: *Deleted

## 2020-09-16 NOTE — Telephone Encounter (Signed)
Nicole Campbell please advise:  Hi Dr. Melvyn Campbell, the 25 mg of HCTZ I've been taking is not really helping the swelling in my ankles and feet.  Can I double up on the pills, and can I take them together or should I split the doses.  Thank you. Nicole Campbell   Pt was last seen on 4/28 by Nicole Campbell and the following recs were given:  Restart HCTZ  25 mg daily   Use jolly ranchers or lifesavers instead of water    For cough or throat congestion or clearing  > take gabapentin 100 mg up to 3 x daily as needed  You will need to schedule an echocardiogram  Either  With your heart doctor or call me and I will schedule with our   Please schedule a follow up office visit in 4 weeks, sooner if needed with cxr on return

## 2020-09-16 NOTE — Telephone Encounter (Signed)
Go ahead and take 2 daily but will need to recheck her in a week - ok to see one of our NP's also to recheck bmet.

## 2020-09-17 ENCOUNTER — Other Ambulatory Visit: Payer: Self-pay

## 2020-09-17 ENCOUNTER — Inpatient Hospital Stay (HOSPITAL_BASED_OUTPATIENT_CLINIC_OR_DEPARTMENT_OTHER): Payer: PPO | Admitting: Oncology

## 2020-09-17 ENCOUNTER — Other Ambulatory Visit (HOSPITAL_COMMUNITY): Payer: Self-pay

## 2020-09-17 ENCOUNTER — Inpatient Hospital Stay: Payer: PPO | Attending: Oncology

## 2020-09-17 ENCOUNTER — Other Ambulatory Visit: Payer: Self-pay | Admitting: Oncology

## 2020-09-17 VITALS — BP 142/66 | HR 88 | Temp 97.8°F | Resp 18 | Wt 150.8 lb

## 2020-09-17 DIAGNOSIS — C9110 Chronic lymphocytic leukemia of B-cell type not having achieved remission: Secondary | ICD-10-CM

## 2020-09-17 DIAGNOSIS — M069 Rheumatoid arthritis, unspecified: Secondary | ICD-10-CM | POA: Diagnosis not present

## 2020-09-17 DIAGNOSIS — D649 Anemia, unspecified: Secondary | ICD-10-CM | POA: Insufficient documentation

## 2020-09-17 DIAGNOSIS — J90 Pleural effusion, not elsewhere classified: Secondary | ICD-10-CM | POA: Insufficient documentation

## 2020-09-17 LAB — CMP (CANCER CENTER ONLY)
ALT: 10 U/L (ref 0–44)
AST: 15 U/L (ref 15–41)
Albumin: 2.6 g/dL — ABNORMAL LOW (ref 3.5–5.0)
Alkaline Phosphatase: 66 U/L (ref 38–126)
Anion gap: 7 (ref 5–15)
BUN: 16 mg/dL (ref 8–23)
CO2: 32 mmol/L (ref 22–32)
Calcium: 8.9 mg/dL (ref 8.9–10.3)
Chloride: 100 mmol/L (ref 98–111)
Creatinine: 0.65 mg/dL (ref 0.44–1.00)
GFR, Estimated: >60 mL/min (ref >60)
Glucose, Bld: 96 mg/dL (ref 70–99)
Potassium: 4.1 mmol/L (ref 3.5–5.1)
Sodium: 139 mmol/L (ref 135–145)
Total Bilirubin: 0.4 mg/dL (ref 0.3–1.2)
Total Protein: 5.5 g/dL — ABNORMAL LOW (ref 6.5–8.1)

## 2020-09-17 LAB — CBC WITH DIFFERENTIAL (CANCER CENTER ONLY)
Abs Immature Granulocytes: 0.08 10*3/uL — ABNORMAL HIGH (ref 0.00–0.07)
Basophils Absolute: 0 10*3/uL (ref 0.0–0.1)
Basophils Relative: 1 %
Eosinophils Absolute: 0.1 10*3/uL (ref 0.0–0.5)
Eosinophils Relative: 2 %
HCT: 31.6 % — ABNORMAL LOW (ref 36.0–46.0)
Hemoglobin: 9.8 g/dL — ABNORMAL LOW (ref 12.0–15.0)
Immature Granulocytes: 1 %
Lymphocytes Relative: 18 %
Lymphs Abs: 1.3 10*3/uL (ref 0.7–4.0)
MCH: 25.3 pg — ABNORMAL LOW (ref 26.0–34.0)
MCHC: 31 g/dL (ref 30.0–36.0)
MCV: 81.4 fL (ref 80.0–100.0)
Monocytes Absolute: 1.2 10*3/uL — ABNORMAL HIGH (ref 0.1–1.0)
Monocytes Relative: 16 %
Neutro Abs: 4.5 10*3/uL (ref 1.7–7.7)
Neutrophils Relative %: 62 %
Platelet Count: 341 10*3/uL (ref 150–400)
RBC: 3.88 MIL/uL (ref 3.87–5.11)
RDW: 18.8 % — ABNORMAL HIGH (ref 11.5–15.5)
WBC Count: 7.2 10*3/uL (ref 4.0–10.5)
nRBC: 0 % (ref 0.0–0.2)

## 2020-09-17 MED ORDER — IMBRUVICA 420 MG PO TABS
ORAL_TABLET | ORAL | 0 refills | Status: DC
  Filled 2020-09-17: qty 28, 25d supply, fill #0
  Filled 2020-09-17: qty 28, 28d supply, fill #0
  Filled ????-??-??: fill #0

## 2020-09-17 NOTE — Progress Notes (Signed)
Hematology and Oncology Follow Up Visit  Nicole Campbell [id number] 11-29-1945 75 y.o. 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  Prior therapy:   She is status post rituximab weekly for 4 weeks completed on August 21, 2018. She is status post thoracentesis completed on August 12, 2019 with positive cytology. Rituximab 375 mg per metered square weekly started on October 13 of 2020 and completed on 03/04/2019.  This will be repeated every 3 months last treatment completed in March 2021. Bendamustine and rituximab started on Sep 03, 2019.  She is here for cycle 3 of therapy.  Current therapy: Ibrutinib 420 mg daily started in July 2020.      Interim History: Nicole Campbell returns today for repeat evaluation.  Since her last visit, she continues to have respiratory complaints including periodic dyspnea on exertion and reaccumulation of her pleural effusion.  Her last thoracentesis was in March 2022.  Chest x-ray on April 28 showed bilateral effusion.  She reports her cough is significantly better on Neurontin.  She does not report any fevers or chills or lymphadenopathy.  She continues to tolerate ibrutinib without any complaints.         .    Medications: Unchanged on review. Current Outpatient Medications  Medication Sig Dispense Refill  . hydrochlorothiazide (HYDRODIURIL) 25 MG tablet Take 1 tablet (25 mg total) by mouth daily. 30 tablet 2  . ibrutinib (IMBRUVICA) 420 MG TABS TAKE 1 TABLET (420 MG) BY MOUTH DAILY. 28 tablet 0  . levothyroxine (SYNTHROID, LEVOTHROID) 25 MCG tablet Take 25 mcg by mouth daily before breakfast.      No current facility-administered medications for this visit.     Allergies:  Allergies  Allergen Reactions  . Methotrexate     Other reaction(s): horrible lower back pain   . Erythromycin Palpitations        Physical Exam:         Blood pressure (!) 142/66, pulse 88, temperature 97.8 F (36.6 C), temperature source Tympanic, resp. rate 18, weight 150 lb 12.8 oz (68.4 kg), SpO2 98 %.     ECOG:1     General appearance: Comfortable appearing without any discomfort Head: Normocephalic without any trauma Oropharynx: Mucous membranes are moist and pink without any thrush or ulcers. Eyes: Pupils are equal and round reactive to light. Lymph nodes: No cervical, supraclavicular, inguinal or axillary lymphadenopathy.   Heart:regular rate and rhythm.  S1 and S2 without leg edema. Lung: Clear without any rhonchi or wheezes.  No dullness to percussion. Abdomin: Soft, nontender, nondistended with good bowel sounds.  No hepatosplenomegaly. Musculoskeletal: No joint deformity or effusion.  Full range of motion noted. Neurological: No deficits noted on motor, sensory and deep tendon reflex exam. Skin: No petechial rash or dryness.  Appeared moist.                       Lab Results: Lab Results  Component Value Date   WBC 11.9 (H) 07/22/2020   HGB 11.6 (L) 07/22/2020   HCT 36.8 07/22/2020   MCV 80.0 07/22/2020   PLT 441 (H) 07/22/2020     Chemistry      Component Value Date/Time   NA 131 (L) 08/16/2020 1038   K 4.2 08/16/2020 1038   CL 96 08/16/2020 1038   CO2 29 08/16/2020 1038  BUN 18 08/16/2020 1038   CREATININE 0.75 08/16/2020 1038   CREATININE 0.82 07/22/2020 1314      Component Value Date/Time   CALCIUM 8.5 08/16/2020 1038   ALKPHOS 86 07/22/2020 1402   AST 18 07/22/2020 1402   AST 13 (L) 07/22/2020 1314   ALT 15 07/22/2020 1402   ALT 13 07/22/2020 1314   BILITOT 0.8 07/22/2020 1402   BILITOT 0.9 07/22/2020 13148     :   75 year old woman with:  1.  CLL diagnosed in 2019.  She was found to have lymphocytosis and adenopathy.  Her disease status was discussed at this time including review of laboratory  data as well as imaging studies including chest x-ray and CT scan in March 2022.  Her white cell count today is within normal range without any evidence of lymphocytosis.  There is no convincing evidence of CLL relapse at this time based on findings of her laboratory testing, chest x-ray and imaging studies.  There is no evidence of lymphocytosis or lymphadenopathy.   2.  Rheumatoid arthritis: No evidence of exacerbation noted at this time.   3.  Bilateral pleural effusion: Remains unclear etiology at this time.  Fluid analysis did not reveal any malignancy or CLL cells.  Ibrutinib complication could also be a possibility.  At this time she is undergoing cardiac evaluation and if no clear-cut reason has been identified it is reasonable to consider switching ibrutinib to [place] as an alternative.  4.  Anemia: Her hemoglobin is down to 9.8 with elevated RDW.  We will update iron studies with the next visit.  5.  Follow-up: She will return in 6 to 8 weeks for repeat follow-up.   30  minutes were dedicated to this visit.  The time was spent on reviewing laboratory data, disease status update and treatment options for the future.   Zola Button, MD 5/20/20222:24 PM

## 2020-09-18 ENCOUNTER — Other Ambulatory Visit (HOSPITAL_COMMUNITY): Payer: Self-pay

## 2020-09-19 ENCOUNTER — Other Ambulatory Visit (HOSPITAL_COMMUNITY): Payer: Self-pay

## 2020-09-20 ENCOUNTER — Other Ambulatory Visit (HOSPITAL_COMMUNITY): Payer: Self-pay

## 2020-09-23 ENCOUNTER — Encounter: Payer: Self-pay | Admitting: Internal Medicine

## 2020-09-23 ENCOUNTER — Ambulatory Visit (INDEPENDENT_AMBULATORY_CARE_PROVIDER_SITE_OTHER): Payer: PPO

## 2020-09-23 ENCOUNTER — Other Ambulatory Visit: Payer: Self-pay

## 2020-09-23 ENCOUNTER — Ambulatory Visit: Payer: PPO | Admitting: Internal Medicine

## 2020-09-23 DIAGNOSIS — J90 Pleural effusion, not elsewhere classified: Secondary | ICD-10-CM | POA: Diagnosis not present

## 2020-09-23 DIAGNOSIS — R053 Chronic cough: Secondary | ICD-10-CM | POA: Diagnosis not present

## 2020-09-23 NOTE — Progress Notes (Signed)
Nicole Campbell, female    DOB: 03/13/1946,    MRN: [id number]   Brief patient profile:  8 yowf never smoker with RA onset 2016 while in [place] and moved to [place] rx mtx and various other meds [name] then noted elevated wbc Nov 2019 dx CLL observation rx intially then Rituxan started 07/2018 which helped the CLL > RA symptoms tried mtx again >> "horrible low back pain" with CT 12/12/2018 neg for pl effusion or ILD  better p pred rx then tapered off by Feb 1st covid shot Feb 12th 2021, then feb 20th 2021 new dry cough indolent onset present daily > March 12 eval at [facility] cxr showed fluid rec no rx per pt   referred to pulmonary clinic 08/07/2019 by Dr   [name]  2nd covid  shot March 7th 2021 > rx 10 days abx for ? Infection at site  = doxycycline   History of Present Illness  08/07/2019  Pulmonary/ 1st office eval/Nicole Campbell RA pt with effusion but RA symptoms are better  Chief Complaint  Patient presents with  . Consult    Referred by Dr. [name] for pleural effusion and cough. Non productive cough.   Dyspnea:  No MMRC2 = can't walk a nl pace on a flat grade s sob but does fine slow and flat [employer] Cough: dry/ worse with talking/ laughing  Sleep: avg 45 degrees x 6 months due to breathing better elevated SABA use: none Worse than usual swelling R > L first noted in 2016  rec Add:  No obvious dx with bilateral effusions likely worse on L > proceed with dx/therapeutic L t centesis  > pos for lymphoma     07/01/2020    ov/Nicole Campbell re: still 3 days left on levaquin, mucus is clear  Was slt green prior to levaquin Chief Complaint  Patient presents with  . Acute Visit    Had covid 19 end of Dec 2021- She states has had cough since then. She has had SOB over the past 2 wks- currently on levaquin 500 per PCP for PNA. Her cough is occ prod with clear to yellow sputum.    Dyspnea: walking room to room inside house, no grocery in over a week  Cough: rattling /clear mucus   Sleeping: almost upright x 2 m SABA use: none  02: none  Covid status:   vax x 3 last  12/29/19  rec For cough > hydromet  1-2 tsp every 4-6 hours Ordered R Tcentesis 07/05/2020 :  800 cc: neg for lymphoma/ more transudative features though borderline     07/12/2020  f/u ov/Nicole Campbell re: bilteral  effusion/ cough x since covid dec 2021  Back on Imbruvica   Chief Complaint  Patient presents with  . Follow-up    Cough  Dyspnea:  Grocery shopping easier , can take deeper breaths but then this makes her cough Cough: clear mucus from nose, no longer green, nonstop daytime cough no better with hycodan  Sleeping:  having to sit almost upright due to cough, not sob  SABA use: none  02: none  Covid status:  All 3 vaccines  Assoc R > L leg swelling  rec Aldactazide  25/25 one twice daily  Gabapentin 100 mg three times a week and if not better after a week take 2 three times daily  Late add: also rec  Try prilosec otc 20mg   Take 30-60 min before first meal of the day and Pepcid ac (  famotidine) 20 mg one @  Bedtime > never started     07/28/2020  f/u ov/Nicole Campbell re: bilateral effusions reduced the the aldactazide to one daily / now on gabapentin 100 mg one daily x 4-5 days  Chief Complaint  Patient presents with  . Follow-up    Feeling better 95%, no cough, decreased ankle edema  Dyspnea:  Grocery shopping / parking lot with sats around 90%  Cough: gone  Sleeping:  45 degrees in recliner x 2 years  SABA use: inhalers 02: none  Covid status:   vax x 3 pfizer  Leg swelling resolved  Drinking lots of water on hctz/ advised to use hard rock candies for dry mouth  rec Use jolly ranchers or lifesavers instead of water   For cough > take gabapentin 100 mg up to 3 x daily as needed    08/26/2020  f/u ov/Nicole Campbell re: bilateral effusions/  ST / chills April 3rd pos covid  Chief Complaint  Patient presents with  . Follow-up    Patient wants to talk about Aldactazide medication and go over chest xray.    Dyspnea:   Able to do grocery, cross parking lots  Cough: minimal pnds/ chest congestion but no mucus  Sleeping: 45 degrees = baselie SABA use: none  02: none  Covid status:   Pfizer x 3  rec Restart HCTZ  25 mg daily  Use jolly ranchers or lifesavers instead of water   For cough or throat congestion or clearing  > take gabapentin 100 mg up to 3 x daily as needed  Please schedule a follow up office visit in 4 weeks, sooner if needed with cxr on return      Echo  09/08/2020   wnl    09/23/2020  f/u ov/Nicole Campbell re:  B pleural effusions/ peripheral  Edema some better on 50 mg hctz daily x one week Chief Complaint  Patient presents with  . Follow-up    Continuing to have shortness of breath with exertion.   Dyspnea:  Some worse crossing parking lot/ [employer] Cough: much better on 100 mg bid gabapentin mostly daytime throat clearing Sleeping: still 45 degrees  SABA use: none 02: none  Covid status:   Pfizer x 3    No obvious day to day or daytime variability or assoc excess/ purulent sputum or mucus plugs or hemoptysis or cp or chest tightness, subjective wheeze or overt sinus or hb symptoms.   Sleeping ok as abov e without nocturnal  or early am exacerbation  of respiratory  c/o's or need for noct saba. Also denies any obvious fluctuation of symptoms with weather or environmental changes or other aggravating or alleviating factors except as outlined above   No unusual exposure hx or h/o childhood pna/ asthma or knowledge of premature birth.  Current Allergies, Complete Past Medical History, Past Surgical History, Family History, and Social History were reviewed in [employer] record.  ROS  The following are not active complaints unless bolded Hoarseness, sore throat, dysphagia, dental problems, itching, sneezing,  nasal congestion or discharge of excess mucus or purulent secretions, ear ache,   fever, chills, sweats, unintended wt loss or wt gain, classically  pleuritic or exertional cp,  orthopnea pnd or arm/hand swelling  or leg swelling, presyncope, palpitations, abdominal pain, anorexia, nausea, vomiting, diarrhea  or change in bowel habits or change in bladder habits, change in stools or change in urine, dysuria, hematuria,  rash, arthralgias, visual complaints, headache, numbness, weakness or ataxia  or problems with walking or coordination,  change in mood or  memory.        Current Meds  Medication Sig  . gabapentin (NEURONTIN) 100 MG capsule Take 100 mg by mouth 2 (two) times daily.  . hydrochlorothiazide (HYDRODIURIL) 25 MG tablet Take 2 tablets (50mg  total) by mouth daily.  Marland Kitchen ibrutinib (IMBRUVICA) 420 MG TABS TAKE 1 TABLET (420 MG) BY MOUTH DAILY.  Marland Kitchen levothyroxine (SYNTHROID, LEVOTHROID) 25 MCG tablet Take 25 mcg by mouth daily before breakfast.                                       Past Medical History:  Diagnosis Date  . CLL (chronic lymphocytic leukemia) ([place])   . Hypertension   . Hypothyroid   . MVP (mitral valve prolapse)   . Rheumatoid arthritis ([facility])          Objective:    09/23/2020        151 08/26/2020        152 07/28/2020        144 07/12/2020        159  07/01/20 163 lb 6.4 oz (74.1 kg)  05/20/20 164 lb (74.4 kg)  03/17/20 163 lb (73.9 kg)     Vital signs reviewed  09/23/2020  - Note at rest 02 sats  99% on RA   General appearance:    amb anxious wf freq throat clearing      HEENT : pt wearing mask not removed for exam due to covid -19 concerns.    NECK :  without JVD/Nodes/TM/ nl carotid upstrokes bilaterally   LUNGS: no acc muscle use,  Nl contour chest decreased bs/ dullness R> L base without cough on insp or exp maneuvers   CV:  RRR  no s3 or murmur or increase in P2, and no edema   ABD:  soft and nontender with nl inspiratory excursion in the supine position. No bruits or organomegaly appreciated, bowel sounds nl  MS:  Nl gait/ ext warm without deformities, calf tenderness, cyanosis or  clubbing No obvious joint restrictions   SKIN: warm and dry without lesions    NEURO:  alert, approp, nl sensorium with  no motor or cerebellar deficits apparent.     CXR PA and Lateral:   09/23/2020 :    I personally reviewed images  / impression as follows:   slt worse effusions  with pseudotumor on R              Assessment

## 2020-09-23 NOTE — Patient Instructions (Signed)
Change back to aldactazide 25/25 one daily and weigh every days with target about 2 lbs less    Keep previous appt

## 2020-09-23 NOTE — Assessment & Plan Note (Signed)
Onset with covid dec 2021  - trial of gabapentin 100 mg tid 07/12/2020  > resolved   Says ok on 100 mg bid now but still freq throat clearing > rec keep hard rock candy handy to keep mouth dry and encouraging swallowing when feels urge to clear her throat.          Each maintenance medication was reviewed in detail including emphasizing most importantly the difference between maintenance and prns and under what circumstances the prns are to be triggered using an action plan format where appropriate.  Total time for H and P, chart review, counseling, reviewing  and generating customized AVS unique to this office visit / same day charting = > 30 min

## 2020-09-23 NOTE — Assessment & Plan Note (Addendum)
Onset of symptoms Feb 2021  -R thoracentesis 08/12/2019 :  1 liter   Exudative, nl glucose,  11k lymphs > flow cyt c/w lymphoma > referred back to oncology and hold off on R tap as feeling much better as of 08/14/2019  - venous dopplers rec due to D dimer 0.95 >>>  08/14/2019 neg bilaterally  - R Tcentesis 07/05/2020 :  800 cc: neg for lymphoma/ more transudative features though borderline - 3/24 /22 start Aldactazide  25/25 one twice daily  - repeat cxr 07/28/2020 > improved so continue aldactazide 25/25 one daily > "too dry" - 08/26/2020 recurrent  R > L  efffusions off rx so restart hctz 25 mg daily  -  Echo  09/08/2020   wnl  - 09/23/2020 fluid building back up with R pseudotumor on 50 mg hctz daily so try restart aldactazide 25-25 one daily   Tentative dx is poor clearance of effusions from low albumin plus ?  prior lymphoma involvement with lymphatic dysfunction?   Aldactone better for 3rd spaced fluids so rec try half the dose of aldactazide since the previous dose caused excess "dryness"  > if not responding can try tapping again or even using a pleurex  but so far handling the effusions fine clinically.  Discussed in detail all the  indications, usual  risks and alternatives  relative to the benefits with patient who agrees to proceed with Rx as outlined.

## 2020-10-06 DIAGNOSIS — H0288B Meibomian gland dysfunction left eye, upper and lower eyelids: Secondary | ICD-10-CM | POA: Diagnosis not present

## 2020-10-06 DIAGNOSIS — H04123 Dry eye syndrome of bilateral lacrimal glands: Secondary | ICD-10-CM | POA: Diagnosis not present

## 2020-10-06 DIAGNOSIS — H0288A Meibomian gland dysfunction right eye, upper and lower eyelids: Secondary | ICD-10-CM | POA: Diagnosis not present

## 2020-10-07 NOTE — Telephone Encounter (Signed)
Returned call to pt she states that she is "having those episodes again" this hasn't happened for quite a couple of years. She feels as if there is a "catch in her throat" and a feeling of panic, her HR "leeps" and also a feeling of anxiety. She will immediately sit down to rest. This resolves in about an hour this will happen maybe 2-4 times a day intermittently. Then it may skip a day or two and happen again. She states that she does not know how else to explain it. Her BP will be good at 110-115/68-72 but HR will be >100-110.   She would like to know if she needs another rx for the metoprolol? Please advise

## 2020-10-08 ENCOUNTER — Other Ambulatory Visit (HOSPITAL_COMMUNITY): Payer: Self-pay

## 2020-10-08 MED ORDER — METOPROLOL SUCCINATE ER 25 MG PO TB24
25.0000 mg | ORAL_TABLET | Freq: Every day | ORAL | 3 refills | Status: DC
  Filled 2020-10-08: qty 90, 90d supply, fill #0
  Filled 2021-03-10: qty 90, 90d supply, fill #1
  Filled 2021-06-06: qty 90, 90d supply, fill #2

## 2020-10-15 ENCOUNTER — Other Ambulatory Visit (HOSPITAL_COMMUNITY): Payer: Self-pay

## 2020-10-15 ENCOUNTER — Other Ambulatory Visit: Payer: Self-pay | Admitting: Oncology

## 2020-10-15 DIAGNOSIS — C9110 Chronic lymphocytic leukemia of B-cell type not having achieved remission: Secondary | ICD-10-CM

## 2020-10-15 MED ORDER — IMBRUVICA 420 MG PO TABS
ORAL_TABLET | ORAL | 0 refills | Status: DC
Start: 2020-10-15 — End: 2020-11-10
  Filled 2020-10-15: qty 28, 28d supply, fill #0

## 2020-10-19 ENCOUNTER — Other Ambulatory Visit (HOSPITAL_COMMUNITY): Payer: Self-pay

## 2020-10-19 DIAGNOSIS — E039 Hypothyroidism, unspecified: Secondary | ICD-10-CM | POA: Diagnosis not present

## 2020-10-19 DIAGNOSIS — Z1159 Encounter for screening for other viral diseases: Secondary | ICD-10-CM | POA: Diagnosis not present

## 2020-10-19 DIAGNOSIS — Z1389 Encounter for screening for other disorder: Secondary | ICD-10-CM | POA: Diagnosis not present

## 2020-10-19 DIAGNOSIS — M069 Rheumatoid arthritis, unspecified: Secondary | ICD-10-CM | POA: Diagnosis not present

## 2020-10-19 DIAGNOSIS — Z0000 Encounter for general adult medical examination without abnormal findings: Secondary | ICD-10-CM | POA: Diagnosis not present

## 2020-10-19 DIAGNOSIS — E7800 Pure hypercholesterolemia, unspecified: Secondary | ICD-10-CM | POA: Diagnosis not present

## 2020-10-19 DIAGNOSIS — C9110 Chronic lymphocytic leukemia of B-cell type not having achieved remission: Secondary | ICD-10-CM | POA: Diagnosis not present

## 2020-10-28 ENCOUNTER — Ambulatory Visit: Payer: PPO | Admitting: Internal Medicine

## 2020-11-05 ENCOUNTER — Ambulatory Visit: Payer: PPO | Admitting: Internal Medicine

## 2020-11-05 ENCOUNTER — Other Ambulatory Visit: Payer: Self-pay

## 2020-11-05 ENCOUNTER — Encounter: Payer: Self-pay | Admitting: Internal Medicine

## 2020-11-05 ENCOUNTER — Ambulatory Visit (INDEPENDENT_AMBULATORY_CARE_PROVIDER_SITE_OTHER): Payer: PPO

## 2020-11-05 DIAGNOSIS — J90 Pleural effusion, not elsewhere classified: Secondary | ICD-10-CM

## 2020-11-05 DIAGNOSIS — J9811 Atelectasis: Secondary | ICD-10-CM | POA: Diagnosis not present

## 2020-11-05 LAB — BASIC METABOLIC PANEL
BUN: 18 mg/dL (ref 6–23)
CO2: 33 mEq/L — ABNORMAL HIGH (ref 19–32)
Calcium: 9.4 mg/dL (ref 8.4–10.5)
Chloride: 97 mEq/L (ref 96–112)
Creatinine, Ser: 0.74 mg/dL (ref 0.40–1.20)
GFR: 80.06 mL/min (ref >60.00)
Glucose, Bld: 86 mg/dL (ref 70–99)
Potassium: 3.8 mEq/L (ref 3.5–5.1)
Sodium: 137 mEq/L (ref 135–145)

## 2020-11-05 LAB — HEPATIC FUNCTION PANEL
ALT: 25 U/L (ref 0–35)
AST: 18 U/L (ref 0–37)
Albumin: 3.6 g/dL (ref 3.5–5.2)
Alkaline Phosphatase: 80 U/L (ref 39–117)
Bilirubin, Direct: 0.2 mg/dL (ref 0.0–0.3)
Total Bilirubin: 0.5 mg/dL (ref 0.2–1.2)
Total Protein: 6.4 g/dL (ref 6.0–8.3)

## 2020-11-05 NOTE — Progress Notes (Signed)
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 Infection at site  = doxycycline   History of Present Illness  08/07/2019  Pulmonary/ 1st office eval/Nicole Campbell RA pt with effusion but RA symptoms are better  Chief Complaint  Patient presents with   Consult    Referred by Dr. Carol Campbell for pleural effusion and cough. Non productive cough.   Dyspnea:  No MMRC2 = can't walk a nl pace on a flat grade s sob but does fine slow and flat [employer] Cough: dry/ worse with talking/ laughing  Sleep: avg 45 degrees x 6 months due to breathing better elevated SABA use: none Worse than usual swelling R > L first noted in 2016  rec Add:  No obvious dx with bilateral effusions likely worse on L > proceed with dx/therapeutic L t centesis  > pos for lymphoma     07/01/2020    ov/Nicole Campbell re: still 3 days left on levaquin, mucus is clear  Was slt green prior to levaquin Chief Complaint  Patient presents with   Acute Visit    Had covid 19 end of Dec 2021- She states has had cough since then. She has had SOB over the past 2 wks- currently on levaquin 500 per PCP for PNA. Her cough is occ prod with clear to yellow sputum.    Dyspnea: walking room to room inside house, no grocery in over a week  Cough: rattling /clear mucus  Sleeping:  almost upright x 2 m SABA use: none  02: none  Covid status:   vax x 3 last  12/29/19  rec For cough > hydromet  1-2 tsp every 4-6 hours Ordered R Tcentesis 07/05/2020 :  800 cc: neg for lymphoma/ more transudative features though borderline     07/12/2020  f/u ov/Nicole Campbell re: bilteral  effusion/ cough x since covid dec 2021  Back on Imbruvica   Chief Complaint  Patient presents with   Follow-up    Cough  Dyspnea:  Grocery shopping easier , can take deeper breaths but then this makes her cough Cough: clear mucus from nose, no longer green, nonstop daytime cough no better with hycodan  Sleeping:  having to sit almost upright due to cough, not sob  SABA use: none  02: none  Covid status:  All 3 vaccines  Assoc R > L leg swelling  rec Aldactazide  25/25 one twice daily  Gabapentin 100 mg three times a week and if not better after a week take 2 three times daily  Late add: also rec  Try prilosec otc 20mg   Take 30-60 min before first meal of the day and Pepcid ac (  famotidine) 20 mg one @  Bedtime > never started     07/28/2020  f/u ov/Nicole Campbell re: bilateral effusions reduced the the aldactazide to one daily / now on gabapentin 100 mg one daily x 4-5 days  Chief Complaint  Patient presents with   Follow-up    Feeling better 95%, no cough, decreased ankle edema  Dyspnea:  Grocery shopping / parking lot with sats around 90%  Cough: gone  Sleeping:  45 degrees in recliner x 2 years  SABA use: inhalers 02: none  Covid status:   vax x 3 pfizer  Leg swelling resolved  Drinking lots of water on hctz/ advised to use hard rock candies for dry mouth  rec Use jolly ranchers or lifesavers instead of water   For cough > take gabapentin 100 mg up to 3 x daily as needed    08/26/2020  f/u ov/Nicole Campbell re: bilateral effusions/  ST / chills April 3rd pos covid  Chief Complaint  Patient presents with   Follow-up    Patient wants to talk about Aldactazide medication and go over chest xray.   Dyspnea:   Able  to do grocery, cross parking lots  Cough: minimal pnds/ chest congestion but no mucus  Sleeping: 45 degrees = baselie SABA use: none  02: none  Covid status:   Pfizer x 3  rec Restart HCTZ  25 mg daily  Use jolly ranchers or lifesavers instead of water   For cough or throat congestion or clearing  > take gabapentin 100 mg up to 3 x daily as needed  Please schedule a follow up office visit in 4 weeks, sooner if needed with cxr on return      Echo  09/08/2020   wnl    09/23/2020  f/u ov/Nicole Campbell re:  B pleural effusions/ peripheral  Edema some better on 50 mg hctz daily x one week Chief Complaint  Patient presents with   Follow-up    Continuing to have shortness of breath with exertion.   Dyspnea:  Some worse crossing parking lot/ [employer] Cough: much better on 100 mg bid gabapentin mostly daytime throat clearing Sleeping: still 45 degrees  SABA use: none 02: none  Covid status:   Pfizer x 3  Rec Change back to aldactazide 25/25 one daily and weigh every days with target about 2 lbs less      11/05/2020  f/u ov/Nicole Campbell re:  Bilateral effusions, cough is gone  - stopped aldactazide completely and swelling returned No chief complaint on file.   Dyspnea:  mainly when talk a lot/ still doing [employer] about the same pace   Cough: gone Sleeping: almost flat  SABA use: none  02: none Covid status:   vax x 4    No obvious day to day or daytime variability or assoc excess/ purulent sputum or mucus plugs or hemoptysis or cp or chest tightness, subjective wheeze or overt sinus or hb symptoms.   Sleeping ok  without nocturnal  or early am exacerbation  of respiratory  c/o's or need for noct saba. Also denies any obvious fluctuation of symptoms with weather or environmental changes or other aggravating or alleviating factors except as outlined above   No unusual exposure hx or h/o childhood pna/ asthma or knowledge of premature birth.  Current Allergies, Complete Past Medical History, Past  Surgical History, Family History, and Social History were reviewed in [employer] record.  ROS  The following are not active complaints unless bolded Hoarseness, sore  throat, dysphagia, dental problems, itching, sneezing,  nasal congestion or discharge of excess mucus or purulent secretions, ear ache,   fever, chills, sweats, unintended wt loss or wt gain, classically pleuritic or exertional cp,  orthopnea pnd or arm/hand swelling  or leg swelling, presyncope, palpitations, abdominal pain, anorexia, nausea, vomiting, diarrhea  or change in bowel habits or change in bladder habits, change in stools or change in urine, dysuria, hematuria,  rash, arthralgias, visual complaints, headache, numbness, weakness or ataxia or problems with walking or coordination,  change in mood or  memory.        Current Meds  Medication Sig   hydrochlorothiazide (HYDRODIURIL) 25 MG tablet Take 25 mg by mouth daily.   ibrutinib (IMBRUVICA) 420 MG TABS TAKE 1 TABLET (420 MG) BY MOUTH DAILY.   levothyroxine (SYNTHROID, LEVOTHROID) 25 MCG tablet Take 25 mcg by mouth daily before breakfast.                                    Past Medical History:  Diagnosis Date   CLL (chronic lymphocytic leukemia) (HCC)    Hypertension    Hypothyroid    MVP (mitral valve prolapse)    Rheumatoid arthritis (HCC)          Objective:    11/05/2020          142 09/23/2020        151 08/26/2020        152 07/28/2020        144 07/12/2020        159  07/01/20 163 lb 6.4 oz (74.1 kg)  05/20/20 164 lb (74.4 kg)  03/17/20 163 lb (73.9 kg)      Vital signs reviewed  11/05/2020  - Note at rest 02 sats  98% on RA   General appearance:    pleasant amb wf nad   HEENT : pt wearing mask not removed for exam due to covid -19 concerns.    NECK :  without JVD/Nodes/TM/ nl carotid upstrokes bilaterally   LUNGS: no acc muscle use,  Nl contour chest with decreased bs/ dullness R>L    without cough on insp or  exp maneuvers   CV:  RRR  no s3 or murmur or increase in P2, and no edema   ABD:  soft and nontender with nl inspiratory excursion in the supine position. No bruits or organomegaly appreciated, bowel sounds nl  MS:  Nl gait/ ext warm without deformities, calf tenderness, cyanosis or clubbing No obvious joint restrictions   SKIN: warm and dry without lesions    NEURO:  Campbell, approp, nl sensorium with  no motor or cerebellar deficits apparent.    CXR PA and Lateral:   11/05/2020 :    I personally reviewed images / radiology impression as follows:     Pseudotumor gone but effusions R>L persist       Labs ordered/ reviewed:      Chemistry      Component Value Date/Time   NA 137 11/05/2020 1230   K 3.8 11/05/2020 1230   CL 97 11/05/2020 1230   CO2 33 (H) 11/05/2020 1230   BUN 18 11/05/2020 1230   CREATININE 0.74 11/05/2020 1230   CREATININE 0.65 09/17/2020 1414  Albumin                    3.6  11/05/2020     Component Value Date/Time   CALCIUM 9.4 11/05/2020 1230   ALKPHOS 80 11/05/2020 1230   AST 18 11/05/2020 1230   AST 15 09/17/2020 1414   ALT 25 11/05/2020 1230   ALT 10 09/17/2020 1414   BILITOT 0.5 11/05/2020 1230   BILITOT 0.4 09/17/2020 1414                    Assessment

## 2020-11-05 NOTE — Patient Instructions (Signed)
Please remember to go to the lab and x-ray department  for your tests - we will call you with the results when they are available.  Please schedule a follow up office visit in 6 weeks, call sooner if needed with cxr on return

## 2020-11-06 ENCOUNTER — Encounter: Payer: Self-pay | Admitting: Internal Medicine

## 2020-11-06 NOTE — Assessment & Plan Note (Signed)
Onset of symptoms Feb 2021  -R thoracentesis 08/12/2019 :  1 liter   Exudative, nl glucose,  11k lymphs > flow cyt c/w lymphoma > referred back to oncology and hold off on R tap as feeling much better as of 08/14/2019  - venous dopplers rec due to D dimer 0.95 >>>  08/14/2019 neg bilaterally  - R Tcentesis 07/05/2020 :  800 cc: neg for lymphoma/ more transudative features though borderline - 3/24 /22 start Aldactazide  25/25 one twice daily  - repeat cxr 07/28/2020 > improved so continue aldactazide 25/25 one daily > "too dry" - 08/26/2020 recurrent  R > L  efffusions off rx so restart hctz 25 mg daily  -  Echo  09/08/2020   wnl  - 09/23/2020 fluid building back up with R pseudotumor on 50 mg hctz daily so try restart aldactazide 25-25 one daily  - 11/05/2020 pseudotumor resolved but effusions persist - not taking aldactazide consistently   If can't control effusions on dose of aldactazide she can tolerate may consider either pleurex or vats bx /pleurodesis on R (oncology may prefer the latter to sort out lymphoma involvement issue)  Discussed in detail all the  indications, usual  risks and alternatives  relative to the benefits with patient who agrees to proceed with Rx as outlined.             Each maintenance medication was reviewed in detail including emphasizing most importantly the difference between maintenance and prns and under what circumstances the prns are to be triggered using an action plan format where appropriate.  Total time for H and P, chart review, counseling,   and generating customized AVS unique to this office visit / same day charting = 22 min

## 2020-11-08 NOTE — Progress Notes (Signed)
Spoke with pt and notified of results per Dr. Wert. Pt verbalized understanding and denied any questions. 

## 2020-11-09 ENCOUNTER — Encounter: Payer: Self-pay | Admitting: *Deleted

## 2020-11-10 ENCOUNTER — Other Ambulatory Visit: Payer: Self-pay | Admitting: Oncology

## 2020-11-10 ENCOUNTER — Encounter: Payer: Self-pay | Admitting: Oncology

## 2020-11-10 ENCOUNTER — Inpatient Hospital Stay: Payer: PPO | Admitting: Oncology

## 2020-11-10 ENCOUNTER — Inpatient Hospital Stay: Payer: PPO | Attending: Oncology

## 2020-11-10 ENCOUNTER — Telehealth: Payer: Self-pay

## 2020-11-10 ENCOUNTER — Other Ambulatory Visit (HOSPITAL_COMMUNITY): Payer: Self-pay

## 2020-11-10 ENCOUNTER — Other Ambulatory Visit: Payer: Self-pay

## 2020-11-10 VITALS — BP 126/63 | HR 89 | Temp 97.9°F | Resp 18 | Ht 66.5 in | Wt 140.3 lb

## 2020-11-10 DIAGNOSIS — C9110 Chronic lymphocytic leukemia of B-cell type not having achieved remission: Secondary | ICD-10-CM

## 2020-11-10 DIAGNOSIS — M069 Rheumatoid arthritis, unspecified: Secondary | ICD-10-CM | POA: Diagnosis not present

## 2020-11-10 DIAGNOSIS — D649 Anemia, unspecified: Secondary | ICD-10-CM | POA: Diagnosis not present

## 2020-11-10 DIAGNOSIS — J90 Pleural effusion, not elsewhere classified: Secondary | ICD-10-CM | POA: Insufficient documentation

## 2020-11-10 LAB — CBC WITH DIFFERENTIAL (CANCER CENTER ONLY)
Abs Immature Granulocytes: 0.03 10*3/uL (ref 0.00–0.07)
Basophils Absolute: 0 10*3/uL (ref 0.0–0.1)
Basophils Relative: 0 %
Eosinophils Absolute: 0 10*3/uL (ref 0.0–0.5)
Eosinophils Relative: 1 %
HCT: 33.3 % — ABNORMAL LOW (ref 36.0–46.0)
Hemoglobin: 10.5 g/dL — ABNORMAL LOW (ref 12.0–15.0)
Immature Granulocytes: 0 %
Lymphocytes Relative: 15 %
Lymphs Abs: 1.2 10*3/uL (ref 0.7–4.0)
MCH: 24.9 pg — ABNORMAL LOW (ref 26.0–34.0)
MCHC: 31.5 g/dL (ref 30.0–36.0)
MCV: 78.9 fL — ABNORMAL LOW (ref 80.0–100.0)
Monocytes Absolute: 1 10*3/uL (ref 0.1–1.0)
Monocytes Relative: 12 %
Neutro Abs: 6 10*3/uL (ref 1.7–7.7)
Neutrophils Relative %: 72 %
Platelet Count: 363 10*3/uL (ref 150–400)
RBC: 4.22 MIL/uL (ref 3.87–5.11)
RDW: 17.6 % — ABNORMAL HIGH (ref 11.5–15.5)
WBC Count: 8.4 10*3/uL (ref 4.0–10.5)
nRBC: 0 % (ref 0.0–0.2)

## 2020-11-10 LAB — CMP (CANCER CENTER ONLY)
ALT: 22 U/L (ref 0–44)
AST: 22 U/L (ref 15–41)
Albumin: 2.8 g/dL — ABNORMAL LOW (ref 3.5–5.0)
Alkaline Phosphatase: 81 U/L (ref 38–126)
Anion gap: 7 (ref 5–15)
BUN: 14 mg/dL (ref 8–23)
CO2: 29 mmol/L (ref 22–32)
Calcium: 9.4 mg/dL (ref 8.9–10.3)
Chloride: 100 mmol/L (ref 98–111)
Creatinine: 0.74 mg/dL (ref 0.44–1.00)
GFR, Estimated: >60 mL/min (ref >60)
Glucose, Bld: 95 mg/dL (ref 70–99)
Potassium: 4.8 mmol/L (ref 3.5–5.1)
Sodium: 136 mmol/L (ref 135–145)
Total Bilirubin: 0.7 mg/dL (ref 0.3–1.2)
Total Protein: 6.2 g/dL — ABNORMAL LOW (ref 6.5–8.1)

## 2020-11-10 MED ORDER — IMBRUVICA 420 MG PO TABS
ORAL_TABLET | ORAL | 0 refills | Status: DC
  Filled 2020-11-10: qty 28, 28d supply, fill #0

## 2020-11-10 NOTE — Progress Notes (Signed)
Hematology and Oncology Follow Up Visit  Nicole Campbell [id number] 1945-08-20 75 y.o. 11/10/2020 9:05 AM Nicole Campbell, MDSmith, Nicole Hope, MD   Principle Diagnosis: 75 year old woman with CLL presented with p53 deletion, deletion of ATM leukocytosis and lymphadenopathy diagnosed in 2019.  Prior therapy:   She is status post rituximab weekly for 4 weeks completed on August 21, 2018. She is status post thoracentesis completed on August 12, 2019 with positive cytology. Rituximab 375 mg per metered square weekly started on October 13 of 2020 and completed on 03/04/2019.  This will be repeated every 3 months last treatment completed in March 2021. Bendamustine and rituximab started on Sep 03, 2019.  She is here for cycle 3 of therapy.  Current therapy: Ibrutinib 420 mg daily started in July 2020.      Interim History: [name] is here for a follow-up visit.  Since the last visit, she reports no major changes in her health.  Her respiratory status continues to improve slowly and has not decline.  She does report some dyspnea exertion but no cough and no shortness of breath at rest.  She denies any nausea, vomiting or abdominal pain.  She denies any bleeding complications or palpitation.  Her quality of life remains maintained.         .    Medications: Updated on review. Current Outpatient Medications  Medication Sig Dispense Refill   hydrochlorothiazide (HYDRODIURIL) 25 MG tablet Take 25 mg by mouth daily.     ibrutinib (IMBRUVICA) 420 MG TABS TAKE 1 TABLET (420 MG) BY MOUTH DAILY. 28 tablet 0   levothyroxine (SYNTHROID, LEVOTHROID) 25 MCG tablet Take 25 mcg by mouth daily before breakfast.      metoprolol succinate (TOPROL XL) 25 MG 24 hr tablet Take 1 tablet (25 mg total) by mouth daily. (Patient not taking: Reported on 11/05/2020) 90 tablet 3   No current facility-administered medications for this visit.     Allergies:  Allergies  Allergen Reactions   Methotrexate      Other reaction(s): horrible lower back pain   Erythromycin Palpitations        Physical Exam:            ECOG:1      General appearance: Alert, awake without any distress. Head: Atraumatic without abnormalities Oropharynx: Without any thrush or ulcers. Eyes: No scleral icterus. Lymph nodes: No lymphadenopathy noted in the cervical, supraclavicular, or axillary nodes Heart:regular rate and rhythm, without any murmurs or gallops.   Lung: Clear to auscultation without any rhonchi, wheezes or dullness to percussion. Abdomin: Soft, nontender without any shifting dullness or ascites. Musculoskeletal: No clubbing or cyanosis. Neurological: No motor or sensory deficits. Skin: No rashes or lesions.                       Lab Results: Lab Results  Component Value Date   WBC 7.2 09/17/2020   HGB 9.8 (L) 09/17/2020   HCT 31.6 (L) 09/17/2020   MCV 81.4 09/17/2020   PLT 341 09/17/2020     Chemistry      Component Value Date/Time   NA 137 11/05/2020 1230   K 3.8 11/05/2020 1230   CL 97 11/05/2020 1230   CO2 33 (H) 11/05/2020 1230   BUN 18 11/05/2020 1230   CREATININE 0.74 11/05/2020 1230   CREATININE 0.65 09/17/2020 1414      Component Value Date/Time   CALCIUM 9.4 11/05/2020 1230   ALKPHOS 80 11/05/2020 1230   AST  18 11/05/2020 1230   AST 15 09/17/2020 1414   ALT 25 11/05/2020 1230   ALT 10 09/17/2020 1414   BILITOT 0.5 11/05/2020 1230   BILITOT 0.4 09/17/2020 14131     :   75 year old woman with:  1.  CLL presented with lymphocytosis and adenopathy in 2019.  She is currently on ibrutinib with excellent hematological response and normalization of her lymphocytosis.  Risks and benefits of continuing this treatment were discussed at this time.  Potential complications were reviewed including cardiac arrhythmia, bleeding among others.  Alternative treatment options will be different oral targeted therapy versus chemotherapy utilizing  venetoclax among others.  Laboratory data from today reviewed and showed complete hematological response at this time.  Her clinical status is improved and we agreed to continue ibrutinib for the time being.   2.  Rheumatoid arthritis: Manageable at this time without any exacerbation.   3.  Bilateral pleural effusion: Most recent chest x-ray on November 05, 2020 showed stable and slightly improved overall.  4.  Anemia: Hemoglobin is improved at this time and does not require any intervention.  5.  Follow-up: In 2 months for repeat follow-up.   30  minutes were spent on this encounter.  The time was dedicated to reviewing laboratory data, imaging studies   Zola Button, MD 7/13/20229:05 AM

## 2020-11-10 NOTE — Telephone Encounter (Signed)
Oral Oncology Patient Advocate Encounter  Was successful in securing patient a $8000 grant from [employer] to provide copayment coverage for Imbruvica.  This will keep the out of pocket expense at $0.     [employer] ID: [id number]  I have spoken with the patient.   The billing information is as follows and has been shared with [place]: [id number] PCN: [id number] Member ID: [id number] Group ID: [id number] Dates of Eligibility: 10/18/20 through 10/17/21  Fund:  [place] [facility] Phone [phone] Fax [phone] 11/10/2020 11:58 AM

## 2020-11-11 ENCOUNTER — Other Ambulatory Visit: Payer: Self-pay | Admitting: Family Medicine

## 2020-11-11 DIAGNOSIS — Z1231 Encounter for screening mammogram for malignant neoplasm of breast: Secondary | ICD-10-CM

## 2020-11-15 ENCOUNTER — Other Ambulatory Visit (HOSPITAL_COMMUNITY): Payer: Self-pay

## 2020-11-16 ENCOUNTER — Other Ambulatory Visit (HOSPITAL_COMMUNITY): Payer: Self-pay

## 2020-11-22 ENCOUNTER — Other Ambulatory Visit: Payer: Self-pay

## 2020-11-22 ENCOUNTER — Encounter (HOSPITAL_BASED_OUTPATIENT_CLINIC_OR_DEPARTMENT_OTHER): Payer: Self-pay | Admitting: Emergency Medicine

## 2020-11-22 ENCOUNTER — Emergency Department (HOSPITAL_BASED_OUTPATIENT_CLINIC_OR_DEPARTMENT_OTHER)
Admission: EM | Admit: 2020-11-22 | Discharge: 2020-11-22 | Disposition: A | Discharge status: Home / Self Care | Payer: PPO | Attending: Emergency Medicine | Admitting: Emergency Medicine

## 2020-11-22 ENCOUNTER — Emergency Department (HOSPITAL_BASED_OUTPATIENT_CLINIC_OR_DEPARTMENT_OTHER): Payer: PPO

## 2020-11-22 DIAGNOSIS — R002 Palpitations: Secondary | ICD-10-CM | POA: Diagnosis not present

## 2020-11-22 DIAGNOSIS — E860 Dehydration: Secondary | ICD-10-CM | POA: Diagnosis not present

## 2020-11-22 DIAGNOSIS — J90 Pleural effusion, not elsewhere classified: Secondary | ICD-10-CM | POA: Diagnosis not present

## 2020-11-22 DIAGNOSIS — E039 Hypothyroidism, unspecified: Secondary | ICD-10-CM | POA: Insufficient documentation

## 2020-11-22 DIAGNOSIS — R000 Tachycardia, unspecified: Secondary | ICD-10-CM

## 2020-11-22 DIAGNOSIS — I10 Essential (primary) hypertension: Secondary | ICD-10-CM | POA: Insufficient documentation

## 2020-11-22 LAB — CBC WITH DIFFERENTIAL/PLATELET
Abs Immature Granulocytes: 0.05 10*3/uL (ref 0.00–0.07)
Basophils Absolute: 0.1 10*3/uL (ref 0.0–0.1)
Basophils Relative: 1 %
Eosinophils Absolute: 0.1 10*3/uL (ref 0.0–0.5)
Eosinophils Relative: 1 %
HCT: 37.7 % (ref 36.0–46.0)
Hemoglobin: 11.6 g/dL — ABNORMAL LOW (ref 12.0–15.0)
Immature Granulocytes: 1 %
Lymphocytes Relative: 18 %
Lymphs Abs: 1.3 10*3/uL (ref 0.7–4.0)
MCH: 24.2 pg — ABNORMAL LOW (ref 26.0–34.0)
MCHC: 30.8 g/dL (ref 30.0–36.0)
MCV: 78.5 fL — ABNORMAL LOW (ref 80.0–100.0)
Monocytes Absolute: 1.2 10*3/uL — ABNORMAL HIGH (ref 0.1–1.0)
Monocytes Relative: 18 %
Neutro Abs: 4.2 10*3/uL (ref 1.7–7.7)
Neutrophils Relative %: 61 %
Platelets: 371 10*3/uL (ref 150–400)
RBC: 4.8 MIL/uL (ref 3.87–5.11)
RDW: 18.4 % — ABNORMAL HIGH (ref 11.5–15.5)
WBC: 6.8 10*3/uL (ref 4.0–10.5)
nRBC: 0 % (ref 0.0–0.2)

## 2020-11-22 LAB — COMPREHENSIVE METABOLIC PANEL
ALT: 19 U/L (ref 0–44)
AST: 18 U/L (ref 15–41)
Albumin: 3.3 g/dL — ABNORMAL LOW (ref 3.5–5.0)
Alkaline Phosphatase: 75 U/L (ref 38–126)
Anion gap: 10 (ref 5–15)
BUN: 15 mg/dL (ref 8–23)
CO2: 28 mmol/L (ref 22–32)
Calcium: 9 mg/dL (ref 8.9–10.3)
Chloride: 100 mmol/L (ref 98–111)
Creatinine, Ser: 0.67 mg/dL (ref 0.44–1.00)
GFR, Estimated: >60 mL/min (ref >60)
Glucose, Bld: 103 mg/dL — ABNORMAL HIGH (ref 70–99)
Potassium: 3.8 mmol/L (ref 3.5–5.1)
Sodium: 138 mmol/L (ref 135–145)
Total Bilirubin: 0.6 mg/dL (ref 0.3–1.2)
Total Protein: 6.8 g/dL (ref 6.5–8.1)

## 2020-11-22 LAB — TROPONIN I (HIGH SENSITIVITY): Troponin I (High Sensitivity): 4 ng/L (ref <18)

## 2020-11-22 LAB — TSH: TSH: 5.375 u[IU]/mL — ABNORMAL HIGH (ref 0.350–4.500)

## 2020-11-22 LAB — MAGNESIUM: Magnesium: 2 mg/dL (ref 1.7–2.4)

## 2020-11-22 MED ORDER — SODIUM CHLORIDE 0.9 % IV BOLUS
500.0000 mL | Freq: Once | INTRAVENOUS | Status: AC
  Administered 2020-11-22: 500 mL via INTRAVENOUS

## 2020-11-22 NOTE — Discharge Instructions (Addendum)
Your history, exam, work-up today are consistent with some dehydration leading to dry mouth, palpitations, and fast heart rate.  The work-up was otherwise reassuring as we discussed together.  Please follow-up with your cardiology team and your primary doctor and please rest and stay hydrated.  If any symptoms change or worsen acutely, please return to the nearest emergency department.

## 2020-11-22 NOTE — ED Triage Notes (Signed)
Pt states she felt her heart racing around 0630. She states fastest rate was around 160. Pt presents wearing her home pulse ox with HR in the 110-120s. Hx of same. She is also on chemotherapy for CLL.

## 2020-11-22 NOTE — ED Provider Notes (Signed)
[facility] EMERGENCY DEPARTMENT Provider Note   CSN: [id number] Arrival date & time: 11/22/20  0719     History Chief Complaint  Patient presents with   Tachycardia    Nicole Campbell is a 75 y.o. female.  The history is provided by the patient, the spouse and medical records. No language interpreter was used.  Palpitations Palpitations quality:  Fast Onset quality:  Sudden Duration:  3 hours Timing:  Constant Progression:  Improving Chronicity:  New Relieved by:  Nothing Worsened by:  Nothing Ineffective treatments:  None tried Associated symptoms: no back pain, no chest pain, no chest pressure, no cough, no diaphoresis, no dizziness, no lower extremity edema, no malaise/fatigue, no nausea, no near-syncope, no numbness, no shortness of breath, no vomiting and no weakness   Risk factors: no hx of PE       Past Medical History:  Diagnosis Date   CLL (chronic lymphocytic leukemia) (HCC)    Hypertension    Hypothyroid    MVP (mitral valve prolapse)    Rheumatoid arthritis ([place])     Patient Active Problem List   Diagnosis Date Noted   Chronic cough 07/13/2020   Goals of care, counseling/discussion 08/21/2019   DOE (dyspnea on exertion) 08/07/2019   Pleural effusion, bilateral 08/07/2019   Hypothyroidism, acquired 08/07/2019   CLL (chronic lymphocytic leukemia) ([place]) 07/10/2018    Past Surgical History:  Procedure Laterality Date   ABDOMINAL HYSTERECTOMY     IR THORACENTESIS ASP PLEURAL SPACE W/IMG GUIDE  11/21/2019   IR THORACENTESIS ASP PLEURAL SPACE W/IMG GUIDE  07/05/2020   TONSILLECTOMY       OB History   No obstetric history on file.     Family History  Problem Relation Age of Onset   Congestive Heart Failure Father     Social History   Tobacco Use   Smoking status: Never   Smokeless tobacco: Never  Vaping Use   Vaping Use: Never used  Substance Use Topics   Alcohol use: Yes    Comment: Rare   Drug use: Never    Home  Medications Prior to Admission medications   Medication Sig Start Date End Date Taking? Authorizing Provider  hydrochlorothiazide (HYDRODIURIL) 25 MG tablet Take 25 mg by mouth daily.    [provider]  ibrutinib (IMBRUVICA) 420 MG TABS TAKE 1 TABLET (420 MG) BY MOUTH DAILY. 11/10/20 11/10/21  Wyatt Portela, MD  levothyroxine (SYNTHROID, LEVOTHROID) 25 MCG tablet Take 25 mcg by mouth daily before breakfast.  04/30/17   [provider]  metoprolol succinate (TOPROL XL) 25 MG 24 hr tablet Take 1 tablet (25 mg total) by mouth daily. Patient not taking: Reported on 11/05/2020 10/08/20   Lelon Perla, MD    Allergies    Methotrexate and Erythromycin  Review of Systems   Review of Systems  Constitutional:  Negative for chills, diaphoresis, fatigue, fever and malaise/fatigue.  HENT:  Negative for congestion.   Respiratory:  Negative for cough, chest tightness, shortness of breath and wheezing.   Cardiovascular:  Positive for palpitations. Negative for chest pain, leg swelling and near-syncope.  Gastrointestinal:  Negative for abdominal pain, constipation, diarrhea, nausea and vomiting.  Genitourinary:  Negative for dysuria, flank pain and frequency.  Musculoskeletal:  Negative for back pain, neck pain and neck stiffness.  Skin:  Negative for rash and wound.  Neurological:  Negative for dizziness, weakness, light-headedness and numbness.  Psychiatric/Behavioral:  Negative for agitation and confusion.   All other systems  reviewed and are negative.  Physical Exam Updated Vital Signs BP 129/70 (BP Location: Right Arm)   Pulse (!) 115   Resp 20   Ht 5' 6.5" (1.689 m)   Wt 63.5 kg   SpO2 97%   BMI 22.26 kg/m   Physical Exam Vitals and nursing note reviewed.  Constitutional:      General: Nicole Campbell is not in acute distress.    Appearance: Nicole Campbell is well-developed. Nicole Campbell is not ill-appearing, toxic-appearing or diaphoretic.  HENT:     Head: Normocephalic and atraumatic.      Mouth/Throat:     Mouth: Mucous membranes are dry.     Pharynx: No oropharyngeal exudate or posterior oropharyngeal erythema.  Eyes:     Conjunctiva/sclera: Conjunctivae normal.     Pupils: Pupils are equal, round, and reactive to light.  Cardiovascular:     Rate and Rhythm: Regular rhythm. Tachycardia present.     Pulses: Normal pulses.     Heart sounds: Murmur heard.  Pulmonary:     Effort: Pulmonary effort is normal. No respiratory distress.     Breath sounds: Normal breath sounds. No wheezing, rhonchi or rales.  Chest:     Chest wall: No tenderness.  Abdominal:     General: Abdomen is flat.     Palpations: Abdomen is soft.     Tenderness: There is no abdominal tenderness. There is no right CVA tenderness, left CVA tenderness, guarding or rebound.  Musculoskeletal:        General: No tenderness.     Cervical back: Neck supple. No tenderness.     Right lower leg: No edema.     Left lower leg: No edema.  Skin:    General: Skin is warm and dry.     Capillary Refill: Capillary refill takes less than 2 seconds.     Findings: No erythema.  Neurological:     General: No focal deficit present.     Mental Status: Nicole Campbell is alert. Mental status is at baseline.  Psychiatric:        Mood and Affect: Mood normal.    ED Results / Procedures / Treatments   Labs (all labs ordered are listed, but only abnormal results are displayed) Labs Reviewed - No data to display  EKG EKG Interpretation  Date/Time:  Monday November 22 2020 07:31:49 EDT Ventricular Rate:  110 PR Interval:  150 QRS Duration: 72 QT Interval:  322 QTC Calculation: 435 R Axis:   73 Text Interpretation: Sinus tachycardia Nonspecific T wave abnormality Abnormal ECG No prior ECG for comparison. Sinus tachycardia. No STEMI Confirmed by Antony Blackbird [phone]) on 11/22/2020 8:40:03 AM  Radiology DG Chest 2 View  Result Date: 11/22/2020 CLINICAL DATA:  History of pleural effusions, palpitations and tachycardia EXAM: CHEST -  2 VIEW COMPARISON:  11/05/2020 FINDINGS: Moderate right pleural effusion is unchanged. Smaller left pleural effusion is also unchanged. Heart size and mediastinal contours. No interstitial edema or airspace consolidation. The visualized osseous structures are unremarkable. IMPRESSION: No change in the appearance of bilateral pleural effusions. Electronically Signed   By: Kerby Moors M.D.   On: 11/22/2020 08:53    Procedures Procedures   Medications Ordered in ED Medications  sodium chloride 0.9 % bolus 500 mL (0 mLs Intravenous Stopped 11/22/20 0945)    ED Course  I have reviewed the triage vital signs and the nursing notes.  Pertinent labs & imaging results that were available during my care of the patient were reviewed by me and considered  in my medical decision making (see chart for details).    MDM Rules/Calculators/A&P                           Yorleni Hibdon is a 75 y.o. female with a past medical history significant for CLL on infusion therapies, prior pleural effusion status post 4 episodes of thoracentesis, hypertension, mitral valve prolapse, return arthritis, and hypothyroidism who presents with palpitations.  Patient reports that this morning Nicole Campbell woke up around 6:30 AM and felt her heart racing fast.  Nicole Campbell took her pulse ox and said her heart rate is around 160.  Nicole Campbell has not had that fast in the past.  Nicole Campbell has been seen for tachycardia and exertional shortness of breath by her cardiologist Dr. Stanford Breed who ordered metoprolol for her but Nicole Campbell reports Nicole Campbell has not been taking it because Nicole Campbell has not needed it.  Nicole Campbell does say Nicole Campbell takes medicine for some fluid overload and has had very dry mouth for the last week or so.  Nicole Campbell is concerned Nicole Campbell might be dehydrated.  Nicole Campbell is trying to drink water but cannot seem to help with her dehydration sensation.  Nicole Campbell denied any chest pain or shortness of breath this morning but had the fast palpitations and was very anxious and concerned.  Nicole Campbell denies  any fevers, chills, chest, or cough.  Nicole Campbell denies any abdominal pain, chest pain, leg pain, or leg swelling.  Denies any trauma.  Nicole Campbell denies other complaints at this time.  On exam, lungs are diminished in the right lower lung however no rhonchi or rales appreciated otherwise.  No wheezing.  Nicole Campbell does have a systolic murmur.  Abdomen nontender, chest nontender.  Legs nontender nonedematous.  Patient otherwise well-appearing.  Nicole Campbell is tachycardic around 1 10-1 20 and has very dry mouth on exam.  Clinical aspect Nicole Campbell is dehydrated from her reported diuresis to keep fluid off from these pleural effusions.  I suspect Nicole Campbell is overshot and is dehydrated.  We will give her some fluids as Nicole Campbell does not appear fluid overloaded whatsoever.  We will get a chest x-ray to look for worsened effusion as well some screening labs to look for AKI or Electra abnormality.  Given her lack of fevers, chills, or new cough, low suspicion for COVID or flu.  Low suspicion for pneumonia at this time we will get a chest x-ray given the effusion history.  Given her lack any chest pain, shortness of breath, and just the palpitation symptoms, low suspicion for a new DVT or PE despite her cancer history.  Anticipate reassessment after rehydration and work-up.  If Nicole Campbell is feeling better, dissipate discharge home to follow-up with her cardiologist to discuss further management of her tachycardia and exertional shortness of breath symptoms.  After fluids, heart rate is now in the 70s and 80s and Nicole Campbell is feeling much better.  Nicole Campbell reports her mouth is not dry now.  Her work-up overall was reassuring otherwise.  After discussion, we feel Nicole Campbell is safe for discharge home to follow-up with her cardiology team and PCP.  Nicole Campbell understands return precautions and follow-up instructions and had no other questions or concerns.  Patient discharged in good condition with resolution of tachycardia and palpitations.  Final Clinical Impression(s) / ED Diagnoses Final  diagnoses:  Palpitations  Tachycardia  Dehydration    Rx / DC Orders ED Discharge Orders     None       Clinical Impression:  1. Palpitations   2. Tachycardia   3. Dehydration     Disposition: Discharge  Condition: Good  I have discussed the results, Dx and Tx plan with the pt(& family if present). He/Nicole Campbell/they expressed understanding and agree(s) with the plan. Discharge instructions discussed at great length. Strict return precautions discussed and pt &/or family have verbalized understanding of the instructions. No further questions at time of discharge.    New Prescriptions   No medications on file    Follow Up: [name], [place] [id number] [phone]     Your cardiologist     [facility] [facility]GENCY DEPARTMENT [address] [id number] [place] [phone]       Bernadett Milian, Gwenyth Allegra, MD 11/22/20 1215

## 2020-11-23 NOTE — Telephone Encounter (Signed)
Dr Melvyn Novas please advise on the following My Chart message:   Hi Dr. Melvyn Novas, Yesterday morning, (Monday),  I had to go to emergency at [facility] in [place] because I had a sudden rapid heartbeat of almost 170 bpm that lasted for about an hour.  Dr. Harrell Gave Tegeler treated me for dehydration.  I'm taking the Aldactazide.   Two weeks ago, I cut down to 1 pill every 2 days, which is still working on my ankles and feet swelling, but at night, I'm still getting the severe dry mouth and tongue.  I was hoping my cutting down  would help with that, but it hasn't.   Could the Aldactazide have contributed to my dehydration, and should I keep taking it?   Thank you, Nicole Campbell  Thank you.

## 2020-12-09 ENCOUNTER — Telehealth: Payer: Self-pay | Admitting: Cardiology

## 2020-12-09 NOTE — Telephone Encounter (Signed)
Spoke to patient she stated she has been having frequent fast heart beat.She would like to see Dr.Crenshaw sooner than Sept.Appointment scheduled with Dr.Crenshaw 8/30 at 11:30 am at [facility] location.

## 2020-12-09 NOTE — Telephone Encounter (Signed)
Patient c/o Palpitations:  High priority if patient c/o lightheadedness, shortness of breath, or chest pain  How long have you had palpitations/irregular HR/ Afib? Are you having the symptoms now?  Last couple of months but it has gotten worse since she cant see her cardiologist til 01/24/21  Are you currently experiencing lightheadedness, SOB or CP? No  Do you have a history of afib (atrial fibrillation) or irregular heart rhythm? Mitral Valve Prolapse since 11/22/20  Have you checked your BP or HR? (document readings if available): 121/68 Pulse 88 bpm  Are you experiencing any other symptoms? Anxiety, rapid heartbeat  Pt has upcoming appt with Crenshaw on 01/24/21

## 2020-12-13 ENCOUNTER — Encounter: Payer: Self-pay | Admitting: Oncology

## 2020-12-13 ENCOUNTER — Other Ambulatory Visit: Payer: Self-pay | Admitting: Oncology

## 2020-12-13 ENCOUNTER — Other Ambulatory Visit (HOSPITAL_COMMUNITY): Payer: Self-pay

## 2020-12-13 DIAGNOSIS — C9110 Chronic lymphocytic leukemia of B-cell type not having achieved remission: Secondary | ICD-10-CM

## 2020-12-13 MED ORDER — IMBRUVICA 420 MG PO TABS
ORAL_TABLET | ORAL | 0 refills | Status: DC
  Filled 2020-12-13: qty 28, 28d supply, fill #0

## 2020-12-15 ENCOUNTER — Other Ambulatory Visit (HOSPITAL_COMMUNITY): Payer: Self-pay

## 2020-12-15 DIAGNOSIS — E039 Hypothyroidism, unspecified: Secondary | ICD-10-CM | POA: Diagnosis not present

## 2020-12-15 DIAGNOSIS — C9110 Chronic lymphocytic leukemia of B-cell type not having achieved remission: Secondary | ICD-10-CM | POA: Diagnosis not present

## 2020-12-15 DIAGNOSIS — F419 Anxiety disorder, unspecified: Secondary | ICD-10-CM | POA: Diagnosis not present

## 2020-12-16 ENCOUNTER — Encounter: Payer: Self-pay | Admitting: Oncology

## 2020-12-16 ENCOUNTER — Other Ambulatory Visit: Payer: Self-pay | Admitting: Internal Medicine

## 2020-12-16 DIAGNOSIS — J90 Pleural effusion, not elsewhere classified: Secondary | ICD-10-CM

## 2020-12-17 ENCOUNTER — Ambulatory Visit (INDEPENDENT_AMBULATORY_CARE_PROVIDER_SITE_OTHER): Payer: PPO

## 2020-12-17 ENCOUNTER — Ambulatory Visit: Payer: PPO | Admitting: Internal Medicine

## 2020-12-17 ENCOUNTER — Encounter: Payer: Self-pay | Admitting: Internal Medicine

## 2020-12-17 ENCOUNTER — Other Ambulatory Visit: Payer: Self-pay

## 2020-12-17 DIAGNOSIS — J90 Pleural effusion, not elsewhere classified: Secondary | ICD-10-CM

## 2020-12-17 DIAGNOSIS — E039 Hypothyroidism, unspecified: Secondary | ICD-10-CM | POA: Diagnosis not present

## 2020-12-17 DIAGNOSIS — R053 Chronic cough: Secondary | ICD-10-CM | POA: Diagnosis not present

## 2020-12-17 DIAGNOSIS — R002 Palpitations: Secondary | ICD-10-CM

## 2020-12-17 LAB — CBC WITH DIFFERENTIAL/PLATELET
Basophils Absolute: 0.1 10*3/uL (ref 0.0–0.1)
Basophils Relative: 1.2 % (ref 0.0–3.0)
Eosinophils Absolute: 0.1 10*3/uL (ref 0.0–0.7)
Eosinophils Relative: 1.8 % (ref 0.0–5.0)
HCT: 35.9 % — ABNORMAL LOW (ref 36.0–46.0)
Hemoglobin: 11.2 g/dL — ABNORMAL LOW (ref 12.0–15.0)
Lymphocytes Relative: 19.1 % (ref 12.0–46.0)
Lymphs Abs: 1.3 10*3/uL (ref 0.7–4.0)
MCHC: 31.3 g/dL (ref 30.0–36.0)
MCV: 77.9 fl — ABNORMAL LOW (ref 78.0–100.0)
Monocytes Absolute: 1.1 10*3/uL — ABNORMAL HIGH (ref 0.1–1.0)
Monocytes Relative: 15.4 % — ABNORMAL HIGH (ref 3.0–12.0)
Neutro Abs: 4.3 10*3/uL (ref 1.4–7.7)
Neutrophils Relative %: 62.5 % (ref 43.0–77.0)
Platelets: 286 10*3/uL (ref 150.0–400.0)
RBC: 4.61 Mil/uL (ref 3.87–5.11)
RDW: 19.8 % — ABNORMAL HIGH (ref 11.5–15.5)
WBC: 6.9 10*3/uL (ref 4.0–10.5)

## 2020-12-17 LAB — BASIC METABOLIC PANEL
BUN: 16 mg/dL (ref 6–23)
CO2: 30 mEq/L (ref 19–32)
Calcium: 9.6 mg/dL (ref 8.4–10.5)
Chloride: 102 mEq/L (ref 96–112)
Creatinine, Ser: 0.76 mg/dL (ref 0.40–1.20)
GFR: 77.47 mL/min (ref >60.00)
Glucose, Bld: 91 mg/dL (ref 70–99)
Potassium: 4.7 mEq/L (ref 3.5–5.1)
Sodium: 138 mEq/L (ref 135–145)

## 2020-12-17 LAB — TSH: TSH: 4.37 u[IU]/mL (ref 0.35–5.50)

## 2020-12-17 LAB — SEDIMENTATION RATE: Sed Rate: 45 mm/hr — ABNORMAL HIGH (ref 0–30)

## 2020-12-17 NOTE — Progress Notes (Signed)
HPI: FU palpitations. CTA August 2020 showed no aneurysm or dissection.  There was increase in lymphadenopathy (Pt with known CLL).  Venous Dopplers April 2021 showed no DVT.  Chest CT April 2021 showed marked progression of the adenopathy consistent with lymphoma.  There was note of new small to moderate bilateral pleural effusions. Patient had thoracentesis and cytology consistent with lymphoma. Monitor May 2021 showed sinus with occasional PACs, brief PAT and PVCs. Echocardiogram May 2022 showed normal LV function, grade 1 diastolic dysfunction.  Patient seen in the emergency room July 2022 with palpitations.  She was felt to be dehydrated.  She was given IV fluids with improvement in her heart rate.  Note hemoglobin was 11.6 and TSH 5.375.  Since last seen she stated that her palpitations have worsened since May.  She feels her heart race particularly in the morning and late evening.  She otherwise does not have dyspnea on exertion, orthopnea, PND, pedal edema, chest pain or syncope.  Note she does state that since initiating Lexapro her symptoms have improved.  Higher doses of Toprol did cause low blood pressure by her report.  Current Outpatient Medications  Medication Sig Dispense Refill   escitalopram (LEXAPRO) 5 MG tablet Take 2.5 mg by mouth daily.     ibrutinib 420 MG TABS Take 1 tablet by mouth daily.     levothyroxine (SYNTHROID, LEVOTHROID) 25 MCG tablet Take 25 mcg by mouth daily before breakfast.      metoprolol succinate (TOPROL XL) 25 MG 24 hr tablet Take 1 tablet (25 mg total) by mouth daily. 90 tablet 3   spironolactone-hydrochlorothiazide (ALDACTAZIDE) 25-25 MG tablet Take 1 tablet by mouth 2 (two) times daily.     No current facility-administered medications for this visit.     Past Medical History:  Diagnosis Date   CLL (chronic lymphocytic leukemia) (HCC)    Hypertension    Hypothyroid    MVP (mitral valve prolapse)    Rheumatoid arthritis ([place])     Past  Surgical History:  Procedure Laterality Date   ABDOMINAL HYSTERECTOMY     IR THORACENTESIS ASP PLEURAL SPACE W/IMG GUIDE  11/21/2019   IR THORACENTESIS ASP PLEURAL SPACE W/IMG GUIDE  07/05/2020   TONSILLECTOMY      Social History   Socioeconomic History   Marital status: Married    Spouse name: Not on file   Number of children: 3   Years of education: Not on file   Highest education level: Not on file  Occupational History   Not on file  Tobacco Use   Smoking status: Never   Smokeless tobacco: Never  Vaping Use   Vaping Use: Never used  Substance and Sexual Activity   Alcohol use: Yes    Comment: Rare   Drug use: Never   Sexual activity: Not on file  Other Topics Concern   Not on file  Social History Narrative   Not on file   Social Determinants of Health   Financial Resource Strain: Not on file  Food Insecurity: Not on file  Transportation Needs: Not on file  Physical Activity: Not on file  Stress: Not on file  Social Connections: Not on file  Intimate Partner Violence: Not on file    Family History  Problem Relation Age of Onset   Congestive Heart Failure Father     ROS: no fevers or chills, productive cough, hemoptysis, dysphasia, odynophagia, melena, hematochezia, dysuria, hematuria, rash, seizure activity, orthopnea, PND, pedal edema, claudication. Remaining  systems are negative.  Physical Exam: Well-developed well-nourished in no acute distress.  Skin is warm and dry.  HEENT is normal.  Neck is supple.  Chest is clear to auscultation with normal expansion.  Cardiovascular exam is regular rate and rhythm.  Abdominal exam nontender or distended. No masses palpated. Extremities show no edema. neuro grossly intact  ECG-November 22, 2020-sinus rhythm tachycardia with nonspecific ST changes.  Personally reviewed  A/P  1 palpitations-previous monitor showed PACs, PVCs and brief PAT.  Recent evaluation in the emergency room showed sinus tachycardia.  We will  continue Toprol at present dose with an additional 12.5 mg daily as needed.  If her symptoms worsen we will repeat monitor.  2 history of mitral valve prolapse-this was not evident on most recent echocardiogram.  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  Follow-up pulmonary.  Kirk Ruths, MD

## 2020-12-17 NOTE — Progress Notes (Signed)
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 Infection at site  = doxycycline   History of Present Illness  08/07/2019  Pulmonary/ 1st office eval/Nicole Campbell RA pt with effusion but RA symptoms are better  Chief Complaint  Patient presents with   Consult    Referred by Dr. [name] for pleural effusion and cough. Non productive cough.   Dyspnea:  No MMRC2 = can't walk a nl pace on a flat grade s sob but does fine slow and flat [employer] Cough: dry/ worse with talking/ laughing  Sleep: avg 45 degrees x 6 months due to breathing better elevated SABA use: none Worse than usual swelling R > L first noted in 2016  rec Add:  No obvious dx with bilateral effusions likely worse on L > proceed with dx/therapeutic L t centesis  > pos for lymphoma     07/01/2020    ov/Nicole Campbell re: still 3 days left on levaquin, mucus is clear  Was slt green prior to levaquin Chief Complaint  Patient presents with   Acute Visit    Had covid 19 end of Dec 2021- She states has had cough since then. She has had SOB over the past 2 wks- currently on levaquin 500 per PCP for PNA. Her cough is occ prod with clear to yellow sputum.    Dyspnea: walking room to room inside house, no grocery in over a week  Cough: rattling /clear mucus  Sleeping:  almost upright x 2 m SABA use: none  02: none  Covid status:   vax x 3 last  12/29/19  rec For cough > hydromet  1-2 tsp every 4-6 hours Ordered R Tcentesis 07/05/2020 :  800 cc: neg for lymphoma/ more transudative features though borderline     07/12/2020  f/u ov/Nicole Campbell re: bilteral  effusion/ cough x since covid dec 2021  Back on Imbruvica   Chief Complaint  Patient presents with   Follow-up    Cough  Dyspnea:  Grocery shopping easier , can take deeper breaths but then this makes her cough Cough: clear mucus from nose, no longer green, nonstop daytime cough no better with hycodan  Sleeping:  having to sit almost upright due to cough, not sob  SABA use: none  02: none  Covid status:  All 3 vaccines  Assoc R > L leg swelling  rec Aldactazide  25/25 one twice daily  Gabapentin 100 mg three times a week and if not better after a week take 2 three times daily  Late add: also rec  Try prilosec otc '20mg'$   Take 30-60 min before first meal of the day and Pepcid ac (  famotidine) 20 mg one @  Bedtime > never started     07/28/2020  f/u ov/Nicole Campbell re: bilateral effusions reduced the the aldactazide to one daily / now on gabapentin 100 mg one daily x 4-5 days  Chief Complaint  Patient presents with   Follow-up    Feeling better 95%, no cough, decreased ankle edema  Dyspnea:  Grocery shopping / parking lot with sats around 90%  Cough: gone  Sleeping:  45 degrees in recliner x 2 years  SABA use: inhalers 02: none  Covid status:   vax x 3 pfizer  Leg swelling resolved  Drinking lots of water on hctz/ advised to use hard rock candies for dry mouth  rec Use jolly ranchers or lifesavers instead of water   For cough > take gabapentin 100 mg up to 3 x daily as needed    08/26/2020  f/u ov/Nicole Campbell re: bilateral effusions/  ST / chills April 3rd pos covid  Chief Complaint  Patient presents with   Follow-up    Patient wants to talk about Aldactazide medication and go over chest xray.   Dyspnea:   Able  to do grocery, cross parking lots  Cough: minimal pnds/ chest congestion but no mucus  Sleeping: 45 degrees = baselie SABA use: none  02: none  Covid status:   Pfizer x 3  rec Restart HCTZ  25 mg daily  Use jolly ranchers or lifesavers instead of water   For cough or throat congestion or clearing  > take gabapentin 100 mg up to 3 x daily as needed  Please schedule a follow up office visit in 4 weeks, sooner if needed with cxr on return      Echo  09/08/2020   wnl    09/23/2020  f/u ov/Nicole Campbell re:  B pleural effusions/ peripheral  Edema some better on 50 mg hctz daily x one week Chief Complaint  Patient presents with   Follow-up    Continuing to have shortness of breath with exertion.   Dyspnea:  Some worse crossing parking lot/ [employer] Cough: much better on 100 mg bid gabapentin mostly daytime throat clearing Sleeping: still 45 degrees  SABA use: none 02: none  Covid status:   Pfizer x 3  Rec Change back to aldactazide 25/25 one daily and weigh every days with target about 2 lbs less      11/05/2020  f/u ov/Nicole Campbell re:  Bilateral effusions, cough is gone  - stopped aldactazide completely and swelling returned  Dyspnea:  mainly when talk a lot/ still doing [employer] about the same pace   Cough: gone Sleeping: almost flat  SABA use: none  02: none Covid status:   vax x 4  Rec Please remember to go to the lab and x-ray department  for your tests - we will call you with the results when they are available. Please schedule a follow up office visit in 6 weeks, call sooner if needed with cxr on return    12/17/2020  f/u ov/Nicole Campbell re: bilateral effusions   maint on aldactazide 25/25  one every 3rd day  Chief Complaint  Patient presents with   Follow-up    Patient reports that she talk about her mitral valve prolapse.     Dyspnea:  limited by palpitations p lopressor  Cough: none  Sleeping: 30 degrees now slt lower  SABA use: none  02: none  Covid status:   vax x 4    No obvious day  to day or daytime  variability or assoc excess/ purulent sputum or mucus plugs or hemoptysis or cp or chest tightness, subjective wheeze or overt sinus or hb symptoms.   Sleeping as above  without nocturnal  or early am exacerbation  of respiratory  c/o's or need for noct saba. Also denies any obvious fluctuation of symptoms with weather or environmental changes or other aggravating or alleviating factors except as outlined above   No unusual exposure hx or h/o childhood pna/ asthma or knowledge of premature birth.  Current Allergies, Complete Past Medical History, Past Surgical History, Family History, and Social History were reviewed in [employer] record.  ROS  The following are not active complaints unless bolded Hoarseness, sore throat, dysphagia, dental problems, itching, sneezing,  nasal congestion or discharge of excess mucus or purulent secretions, ear ache,   fever, chills, sweats, unintended wt loss or wt gain, classically pleuritic or exertional cp,  orthopnea pnd or arm/hand swelling  or leg swelling slt worse since decreased aldactazide to every 3rd day, presyncope, palpitations, abdominal pain, anorexia, nausea, vomiting, diarrhea  or change in bowel habits or change in bladder habits, change in stools or change in urine, dysuria, hematuria,  rash, arthralgias, visual complaints, headache, numbness, weakness or ataxia or problems with walking or coordination,  change in mood= more anxious or  memory.        Current Meds  Medication Sig   escitalopram (LEXAPRO) 5 MG tablet Take 2.5 mg by mouth daily.   ibrutinib 420 MG TABS Take 1 tablet by mouth daily.   levothyroxine (SYNTHROID, LEVOTHROID) 25 MCG tablet Take 25 mcg by mouth daily before breakfast.    metoprolol succinate (TOPROL XL) 25 MG 24 hr tablet Take 1 tablet (25 mg total) by mouth daily.   spironolactone-hydrochlorothiazide (ALDACTAZIDE) 25-25 MG tablet Take 1 tablet by mouth 2 (two) times daily.          Past Medical History:  Diagnosis Date   CLL (chronic lymphocytic leukemia) ([place])    Hypertension    Hypothyroid    MVP (mitral valve prolapse)    Rheumatoid arthritis (HCC)        Objective:    12/17/2020        141  11/05/2020          142 09/23/2020        151 08/26/2020        152 07/28/2020        144 07/12/2020        159  07/01/20 163 lb 6.4 oz (74.1 kg)  05/20/20 164 lb (74.4 kg)  03/17/20 163 lb (73.9 kg)     Vital signs reviewed  12/17/2020  - Note at rest 02 sats  98% on RA   General appearance:    chronically ill elderly wf nad     HEENT : pt wearing mask not removed for exam due to covid -19 concerns.    NECK :  without JVD/Nodes/TM/ nl carotid upstrokes bilaterally   LUNGS: no acc muscle use,  Nl contour chest  with decreased bs in bases with dullness  R>L  bilaterally without cough on insp or exp maneuvers   CV:  RRR  no s3 or murmur or increase in P2, and  trace R > L  LE pitting edema  ABD:  soft and nontender with nl inspiratory excursion in the supine position. No bruits or organomegaly appreciated, bowel sounds nl  MS:  Nl gait/ ext warm without deformities, calf tenderness, cyanosis or  clubbing No obvious joint restrictions   SKIN: warm and dry without lesions    NEURO:  Campbell, approp, nl sensorium with  no motor or cerebellar deficits apparent.       CXR PA and Lateral:   12/17/2020 :    I personally reviewed images and  impression as follows:   Slt improvement aeration, slt decrease in effusions bilaterally still  R >> L  Labs ordered/ reviewed:      Chemistry      Component Value Date/Time   NA 138 12/17/2020 1010   K 4.7 12/17/2020 1010   CL 102 12/17/2020 1010   CO2 30 12/17/2020 1010   BUN 16 12/17/2020 1010   CREATININE 0.76 12/17/2020 1010   CREATININE 0.74 11/10/2020 0855      Component Value Date/Time   CALCIUM 9.6 12/17/2020 1010   ALKPHOS 75 11/22/2020 0804   AST 18 11/22/2020 0804   AST 22 11/10/2020 0855   ALT 19  11/22/2020 0804   ALT 22 11/10/2020 0855   BILITOT 0.6 11/22/2020 0804   BILITOT 0.7 11/10/2020 0855        Lab Results  Component Value Date   WBC 6.9 12/17/2020   HGB 11.2 (L) 12/17/2020   HCT 35.9 (L) 12/17/2020   MCV 77.9 (L) 12/17/2020   PLT 286.0 12/17/2020      Lab Results  Component Value Date   TSH 4.37 12/17/2020         Lab Results  Component Value Date   ESRSEDRATE 45 (H) 12/17/2020   ESRSEDRATE 29 07/01/2020   ESRSEDRATE 6 08/07/2019                 Assessment

## 2020-12-17 NOTE — Patient Instructions (Signed)
No change in your medications  Discuss your palpitations with Dr Stanford Breed  Please schedule a follow up visit in 3 months but call sooner if needed with cxr on return

## 2020-12-17 NOTE — Assessment & Plan Note (Addendum)
Lab Results  Component Value Date   TSH 4.37 12/17/2020     Adequate control on present rx, reviewed in detail with pt > no change in rx needed  > f/u per PCP planned

## 2020-12-17 NOTE — Assessment & Plan Note (Addendum)
Onset of symptoms Feb 2021  -R thoracentesis 08/12/2019 :  1 liter   Exudative, nl glucose,  11k lymphs > flow cyt c/w lymphoma > referred back to oncology and hold off on R tap as feeling much better as of 08/14/2019  - venous dopplers rec due to D dimer 0.95 >>>  08/14/2019 neg bilaterally  - R Tcentesis 07/05/2020 :  800 cc: neg for lymphoma/ more transudative features though borderline - 3/24 /22 start Aldactazide  25/25 one twice daily  - repeat cxr 07/28/2020 > improved so continue aldactazide 25/25 one daily > "too dry" - 08/26/2020 recurrent  R > L  efffusions off rx so restart hctz 25 mg daily  -  Echo  09/08/2020   wnl  - 09/23/2020 fluid building back up with R pseudotumor on 50 mg hctz daily so try restart aldactazide 25-25 one daily  - 11/05/2020 pseudotumor resolved but effusions persist - not taking aldactazide consistently  - 12/17/2020 fluid slt reduced on aldatazide 25/25 one every 3d day but slt increase in R > L leg edema   Advised to use fluid in legs and angle she sleeps at to monitor whether dose of aldactazide 25/25 is approp and can titrate herself between one daily and one every 3rd day which also obvious depends on fluid/nacl intake/ advised.

## 2020-12-17 NOTE — Assessment & Plan Note (Signed)
Echo 09/08/20 ok s LA abn / RA pressure est 3   Already on lopressor with no apparent benefit as her symptoms are happening after her single am dose and today pre dose per pulse at rest is only 68 so no room to increase it > f/u Dr Stanford Breed planned.

## 2020-12-18 ENCOUNTER — Encounter: Payer: Self-pay | Admitting: Internal Medicine

## 2020-12-18 NOTE — Telephone Encounter (Signed)
MW please advise on the message below from the patient.  This is in regard to her SED rate level:  Should I be concerned that my number is 45?

## 2020-12-18 NOTE — Assessment & Plan Note (Signed)
Onset with covid dec 2021  - trial of gabapentin 100 mg tid 07/12/2020  > resolved   Despite effusions the cough remains quiescent off all rx          Each maintenance medication was reviewed in detail including emphasizing most importantly the difference between maintenance and prns and under what circumstances the prns are to be triggered using an action plan format where appropriate.  Total time for H and P, chart review, counseling   and generating customized AVS unique to this office visit / same day charting = 26 min

## 2020-12-20 ENCOUNTER — Encounter: Payer: Self-pay | Admitting: *Deleted

## 2020-12-28 ENCOUNTER — Ambulatory Visit: Payer: PPO | Admitting: Cardiology

## 2020-12-28 ENCOUNTER — Encounter: Payer: Self-pay | Admitting: Cardiology

## 2020-12-28 ENCOUNTER — Other Ambulatory Visit: Payer: Self-pay

## 2020-12-28 VITALS — BP 138/62 | HR 79 | Ht 66.5 in | Wt 139.6 lb

## 2020-12-28 DIAGNOSIS — I341 Nonrheumatic mitral (valve) prolapse: Secondary | ICD-10-CM | POA: Diagnosis not present

## 2020-12-28 DIAGNOSIS — R002 Palpitations: Secondary | ICD-10-CM

## 2020-12-28 NOTE — Patient Instructions (Signed)

## 2020-12-30 DIAGNOSIS — Z961 Presence of intraocular lens: Secondary | ICD-10-CM | POA: Diagnosis not present

## 2020-12-30 DIAGNOSIS — H04123 Dry eye syndrome of bilateral lacrimal glands: Secondary | ICD-10-CM | POA: Diagnosis not present

## 2020-12-30 DIAGNOSIS — H26493 Other secondary cataract, bilateral: Secondary | ICD-10-CM | POA: Diagnosis not present

## 2020-12-30 DIAGNOSIS — H0288B Meibomian gland dysfunction left eye, upper and lower eyelids: Secondary | ICD-10-CM | POA: Diagnosis not present

## 2020-12-30 DIAGNOSIS — H35371 Puckering of macula, right eye: Secondary | ICD-10-CM | POA: Diagnosis not present

## 2020-12-30 DIAGNOSIS — H0288A Meibomian gland dysfunction right eye, upper and lower eyelids: Secondary | ICD-10-CM | POA: Diagnosis not present

## 2021-01-05 ENCOUNTER — Other Ambulatory Visit: Payer: Self-pay

## 2021-01-05 ENCOUNTER — Ambulatory Visit
Admission: RE | Admit: 2021-01-05 | Discharge: 2021-01-05 | Disposition: A | Discharge status: Home / Self Care | Payer: PPO | Source: Ambulatory Visit | Attending: Family Medicine | Admitting: Family Medicine

## 2021-01-05 DIAGNOSIS — Z1231 Encounter for screening mammogram for malignant neoplasm of breast: Secondary | ICD-10-CM

## 2021-01-06 ENCOUNTER — Other Ambulatory Visit (HOSPITAL_COMMUNITY): Payer: Self-pay

## 2021-01-06 ENCOUNTER — Other Ambulatory Visit: Payer: Self-pay | Admitting: Oncology

## 2021-01-06 DIAGNOSIS — C9110 Chronic lymphocytic leukemia of B-cell type not having achieved remission: Secondary | ICD-10-CM

## 2021-01-06 MED ORDER — IMBRUVICA 420 MG PO TABS
ORAL_TABLET | ORAL | 0 refills | Status: DC
  Filled 2021-01-06: qty 28, 28d supply, fill #0

## 2021-01-10 ENCOUNTER — Other Ambulatory Visit (HOSPITAL_COMMUNITY): Payer: Self-pay

## 2021-01-18 DIAGNOSIS — F419 Anxiety disorder, unspecified: Secondary | ICD-10-CM | POA: Diagnosis not present

## 2021-01-18 DIAGNOSIS — S80861A Insect bite (nonvenomous), right lower leg, initial encounter: Secondary | ICD-10-CM | POA: Diagnosis not present

## 2021-01-18 NOTE — Progress Notes (Deleted)
HPI: FU palpitations. CTA August 2020 showed no aneurysm or dissection.  There was increase in lymphadenopathy (Pt with known CLL).  Venous Dopplers April 2021 showed no DVT.  Chest CT April 2021 showed marked progression of the adenopathy consistent with lymphoma.  There was note of new small to moderate bilateral pleural effusions. Patient had thoracentesis and cytology consistent with lymphoma. Monitor May 2021 showed sinus with occasional PACs, brief PAT and PVCs. Echocardiogram May 2022 showed normal LV function, grade 1 diastolic dysfunction.  Patient seen in the emergency room July 2022 with palpitations.  She was felt to be dehydrated.  She was given IV fluids with improvement in her heart rate.  Note hemoglobin was 11.6 and TSH 5.375.  Since last seen   Current Outpatient Medications  Medication Sig Dispense Refill   escitalopram (LEXAPRO) 5 MG tablet Take 2.5 mg by mouth daily.     ibrutinib (IMBRUVICA) 420 MG TABS TAKE 1 TABLET (420 MG) BY MOUTH DAILY. 28 tablet 0   ibrutinib 420 MG TABS Take 1 tablet by mouth daily.     levothyroxine (SYNTHROID, LEVOTHROID) 25 MCG tablet Take 25 mcg by mouth daily before breakfast.      metoprolol succinate (TOPROL XL) 25 MG 24 hr tablet Take 1 tablet (25 mg total) by mouth daily. 90 tablet 3   spironolactone-hydrochlorothiazide (ALDACTAZIDE) 25-25 MG tablet Take 1 tablet by mouth 2 (two) times daily.     No current facility-administered medications for this visit.     Past Medical History:  Diagnosis Date   CLL (chronic lymphocytic leukemia) (HCC)    Hypertension    Hypothyroid    MVP (mitral valve prolapse)    Rheumatoid arthritis ([place])     Past Surgical History:  Procedure Laterality Date   ABDOMINAL HYSTERECTOMY     IR THORACENTESIS ASP PLEURAL SPACE W/IMG GUIDE  11/21/2019   IR THORACENTESIS ASP PLEURAL SPACE W/IMG GUIDE  07/05/2020   TONSILLECTOMY      Social History   Socioeconomic History   Marital status: Married     Spouse name: Not on file   Number of children: 3   Years of education: Not on file   Highest education level: Not on file  Occupational History   Not on file  Tobacco Use   Smoking status: Never   Smokeless tobacco: Never  Vaping Use   Vaping Use: Never used  Substance and Sexual Activity   Alcohol use: Yes    Comment: Rare   Drug use: Never   Sexual activity: Not on file  Other Topics Concern   Not on file  Social History Narrative   Not on file   Social Determinants of Health   Financial Resource Strain: Not on file  Food Insecurity: Not on file  Transportation Needs: Not on file  Physical Activity: Not on file  Stress: Not on file  Social Connections: Not on file  Intimate Partner Violence: Not on file    Family History  Problem Relation Age of Onset   Congestive Heart Failure Father     ROS: no fevers or chills, productive cough, hemoptysis, dysphasia, odynophagia, melena, hematochezia, dysuria, hematuria, rash, seizure activity, orthopnea, PND, pedal edema, claudication. Remaining systems are negative.  Physical Exam: Well-developed well-nourished in no acute distress.  Skin is warm and dry.  HEENT is normal.  Neck is supple.  Chest is clear to auscultation with normal expansion.  Cardiovascular exam is regular rate and rhythm.  Abdominal exam  nontender or distended. No masses palpated. Extremities show no edema. neuro grossly intact  ECG- personally reviewed  A/P  1 palpitations-previous monitor showed PACs, PVCs and brief PAT.  2 history of mitral valve prolapse-not evident on most recent echocardiogram.  3 recurrent lymphoma-Per oncology.  She is requiring bilateral malignant pleural effusion thoracenteses.  Kirk Ruths, MD

## 2021-01-19 ENCOUNTER — Other Ambulatory Visit: Payer: Self-pay

## 2021-01-19 ENCOUNTER — Inpatient Hospital Stay: Payer: PPO | Admitting: Oncology

## 2021-01-19 ENCOUNTER — Inpatient Hospital Stay: Payer: PPO | Attending: Oncology

## 2021-01-19 VITALS — BP 126/59 | HR 69 | Temp 98.9°F | Resp 15 | Ht 66.5 in | Wt 144.7 lb

## 2021-01-19 DIAGNOSIS — Z79899 Other long term (current) drug therapy: Secondary | ICD-10-CM | POA: Diagnosis not present

## 2021-01-19 DIAGNOSIS — R238 Other skin changes: Secondary | ICD-10-CM | POA: Diagnosis not present

## 2021-01-19 DIAGNOSIS — M069 Rheumatoid arthritis, unspecified: Secondary | ICD-10-CM | POA: Insufficient documentation

## 2021-01-19 DIAGNOSIS — D649 Anemia, unspecified: Secondary | ICD-10-CM | POA: Diagnosis not present

## 2021-01-19 DIAGNOSIS — J90 Pleural effusion, not elsewhere classified: Secondary | ICD-10-CM | POA: Diagnosis not present

## 2021-01-19 DIAGNOSIS — C9110 Chronic lymphocytic leukemia of B-cell type not having achieved remission: Secondary | ICD-10-CM

## 2021-01-19 LAB — CBC WITH DIFFERENTIAL (CANCER CENTER ONLY)
Abs Immature Granulocytes: 0.01 10*3/uL (ref 0.00–0.07)
Basophils Absolute: 0.1 10*3/uL (ref 0.0–0.1)
Basophils Relative: 1 %
Eosinophils Absolute: 0.3 10*3/uL (ref 0.0–0.5)
Eosinophils Relative: 5 %
HCT: 35.4 % — ABNORMAL LOW (ref 36.0–46.0)
Hemoglobin: 11.3 g/dL — ABNORMAL LOW (ref 12.0–15.0)
Immature Granulocytes: 0 %
Lymphocytes Relative: 25 %
Lymphs Abs: 1.4 10*3/uL (ref 0.7–4.0)
MCH: 26 pg (ref 26.0–34.0)
MCHC: 31.9 g/dL (ref 30.0–36.0)
MCV: 81.4 fL (ref 80.0–100.0)
Monocytes Absolute: 0.9 10*3/uL (ref 0.1–1.0)
Monocytes Relative: 17 %
Neutro Abs: 2.9 10*3/uL (ref 1.7–7.7)
Neutrophils Relative %: 52 %
Platelet Count: 235 10*3/uL (ref 150–400)
RBC: 4.35 MIL/uL (ref 3.87–5.11)
RDW: 19 % — ABNORMAL HIGH (ref 11.5–15.5)
WBC Count: 5.7 10*3/uL (ref 4.0–10.5)
nRBC: 0 % (ref 0.0–0.2)

## 2021-01-19 LAB — CMP (CANCER CENTER ONLY)
ALT: 11 U/L (ref 0–44)
AST: 11 U/L — ABNORMAL LOW (ref 15–41)
Albumin: 3.3 g/dL — ABNORMAL LOW (ref 3.5–5.0)
Alkaline Phosphatase: 70 U/L (ref 38–126)
Anion gap: 9 (ref 5–15)
BUN: 12 mg/dL (ref 8–23)
CO2: 30 mmol/L (ref 22–32)
Calcium: 9.6 mg/dL (ref 8.9–10.3)
Chloride: 103 mmol/L (ref 98–111)
Creatinine: 0.76 mg/dL (ref 0.44–1.00)
GFR, Estimated: >60 mL/min (ref >60)
Glucose, Bld: 81 mg/dL (ref 70–99)
Potassium: 4.4 mmol/L (ref 3.5–5.1)
Sodium: 142 mmol/L (ref 135–145)
Total Bilirubin: 0.6 mg/dL (ref 0.3–1.2)
Total Protein: 5.9 g/dL — ABNORMAL LOW (ref 6.5–8.1)

## 2021-01-19 NOTE — Progress Notes (Signed)
Hematology and Oncology Follow Up Visit  Nicole Campbell [id number] 1945-09-07 75 y.o. 01/19/2021 1:06 PM Nicole Campbell, MDSmith, Nicole Hope, MD   Principle Diagnosis: 75 year old woman with CLL diagnosed in 2019.  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  Prior therapy:   She is status post rituximab weekly for 4 weeks completed on August 21, 2018. She is status post thoracentesis completed on August 12, 2019 with positive cytology. Rituximab 375 mg per metered square weekly started on October 13 of 2020 and completed on 03/04/2019.  This will be repeated every 3 months last treatment completed in March 2021. Bendamustine and rituximab started on Sep 03, 2019.  She is here for cycle 3 of therapy.  Current therapy: Ibrutinib 420 mg daily started in July 2020.      Interim History: Nicole Campbell returns today for repeat evaluation.  Since her last visit, she reports no major changes in her health.  She has reported increase in her lower extremity edema after recent trip to the beach where she has not taken her diuretics.  He denies recent hospitalizations or illnesses.  She denies any shortness of breath or difficulty breathing.  She denies any bleeding or palpitation.  She continues to tolerate ibrutinib otherwise reasonably well.         .    Medications: Reviewed without changes. Current Outpatient Medications  Medication Sig Dispense Refill   escitalopram (LEXAPRO) 5 MG tablet Take 2.5 mg by mouth daily.     ibrutinib (IMBRUVICA) 420 MG TABS TAKE 1 TABLET (420 MG) BY MOUTH DAILY. 28 tablet 0   ibrutinib 420 MG TABS Take 1 tablet by mouth daily.     levothyroxine (SYNTHROID, LEVOTHROID) 25 MCG tablet Take 25 mcg by mouth daily before breakfast.      metoprolol succinate (TOPROL XL) 25 MG 24 hr tablet Take 1 tablet (25 mg total) by mouth daily. 90 tablet 3   spironolactone-hydrochlorothiazide (ALDACTAZIDE) 25-25 MG tablet Take 1  tablet by mouth 2 (two) times daily.     No current facility-administered medications for this visit.     Allergies:  Allergies  Allergen Reactions   Methotrexate Other (See Comments)    Other reaction(s): horrible lower back pain   Erythromycin Palpitations        Physical Exam:        Blood pressure (!) 126/59, pulse 69, temperature 98.9 F (37.2 C), temperature source Oral, resp. rate 15, height 5' 6.5" (1.689 m), weight 144 lb 11.2 oz (65.6 kg), SpO2 98 %.     ECOG:1     General appearance: Comfortable appearing without any discomfort Head: Normocephalic without any trauma Oropharynx: Mucous membranes are moist and pink without any thrush or ulcers. Eyes: Pupils are equal and round reactive to light. Lymph nodes: No cervical, supraclavicular, inguinal or axillary lymphadenopathy.   Heart:regular rate and rhythm.  S1 and S2.  Trace edema noted at the ankle level bilaterally. Lung: Clear without any rhonchi or wheezes.  No dullness to percussion. Abdomin: Soft, nontender, nondistended with good bowel sounds.  No hepatosplenomegaly. Musculoskeletal: No joint deformity or effusion.  Full range of motion noted. Neurological: No deficits noted on motor, sensory and deep tendon reflex exam. Skin: Mild erythema noted with a blister noted on her left lower extremity.                       Lab Results: Lab Results  Component Value  Date   WBC 6.9 12/17/2020   HGB 11.2 (L) 12/17/2020   HCT 35.9 (L) 12/17/2020   MCV 77.9 (L) 12/17/2020   PLT 286.0 12/17/2020     Chemistry      Component Value Date/Time   NA 138 12/17/2020 1010   K 4.7 12/17/2020 1010   CL 102 12/17/2020 1010   CO2 30 12/17/2020 1010   BUN 16 12/17/2020 1010   CREATININE 0.76 12/17/2020 1010   CREATININE 0.74 11/10/2020 0855      Component Value Date/Time   CALCIUM 9.6 12/17/2020 1010   ALKPHOS 75 11/22/2020 0804   AST 18 11/22/2020 0804   AST 22 11/10/2020 0855   ALT  19 11/22/2020 0804   ALT 22 11/10/2020 0855   BILITOT 0.6 11/22/2020 0804   BILITOT 0.7 11/10/2020 384       75 year old woman with:  1.  CLL diagnosed in 2019 after presenting with lymphocytosis and adenopathy and found to have p53 deletion.  He remains on ibrutinib without any major complaints at this time.  He achieved in hematological remission without any major complaints at this time.  Risks and benefits of continuing this treatment were reviewed.  Complications including palpitation, cardiac toxicity and bleeding were reviewed.  Alternative treatment options including systemic chemotherapy, venetoclax, among others.  For the time being she is agreeable to continue.   2.  Rheumatoid arthritis: No recent exacerbation noted.  She has not required any active treatment.   3.  Bilateral pleural effusion: Chest x-ray obtained in August 2022 did not show any reaccumulation of changes.  4.  Anemia: Hemoglobin continues to be at baseline without any need for transfusion.  We will continue to follow.  5.  Skin bullae: Unclear etiology.  I recommended dermatology evaluation.  She will keep that area clean and dry without any signs or symptoms of infection.  6.  Follow-up: He will return in 3 months for repeat follow-up.   30  minutes were dedicated to this visit.  Time spent on reviewing laboratory data, disease status discussion, treatment choices and addressing complications related to her therapy.   Zola Button, MD 9/21/20221:06 PM

## 2021-01-24 ENCOUNTER — Ambulatory Visit: Payer: PPO | Admitting: Cardiology

## 2021-01-24 ENCOUNTER — Encounter: Payer: Self-pay | Admitting: Oncology

## 2021-01-31 ENCOUNTER — Other Ambulatory Visit (HOSPITAL_COMMUNITY): Payer: Self-pay

## 2021-02-02 ENCOUNTER — Other Ambulatory Visit (HOSPITAL_COMMUNITY): Payer: Self-pay

## 2021-02-02 ENCOUNTER — Other Ambulatory Visit: Payer: Self-pay | Admitting: Oncology

## 2021-02-02 DIAGNOSIS — C9110 Chronic lymphocytic leukemia of B-cell type not having achieved remission: Secondary | ICD-10-CM

## 2021-02-02 MED ORDER — IMBRUVICA 420 MG PO TABS
ORAL_TABLET | ORAL | 0 refills | Status: DC
  Filled 2021-02-02: qty 28, 28d supply, fill #0

## 2021-02-03 DIAGNOSIS — L300 Nummular dermatitis: Secondary | ICD-10-CM | POA: Diagnosis not present

## 2021-02-03 DIAGNOSIS — M5431 Sciatica, right side: Secondary | ICD-10-CM | POA: Diagnosis not present

## 2021-02-03 DIAGNOSIS — L309 Dermatitis, unspecified: Secondary | ICD-10-CM | POA: Diagnosis not present

## 2021-02-03 DIAGNOSIS — D485 Neoplasm of uncertain behavior of skin: Secondary | ICD-10-CM | POA: Diagnosis not present

## 2021-02-08 ENCOUNTER — Other Ambulatory Visit (HOSPITAL_COMMUNITY): Payer: Self-pay

## 2021-02-16 ENCOUNTER — Telehealth: Payer: Self-pay

## 2021-02-16 ENCOUNTER — Other Ambulatory Visit (HOSPITAL_COMMUNITY): Payer: Self-pay

## 2021-02-16 NOTE — Telephone Encounter (Signed)
Oral Oncology Patient Advocate Encounter   Was successful in securing patient an $50 grant from [employer] [place] [facility]) to provide copayment coverage for Imbruvica.  This will keep the out of pocket expense at $0.     I have spoken with the patient.    The billing information is as follows and has been shared with [place].   Member ID: [id number] Group ID: [id number] RxBin: [id number] Dates of Eligibility: 02/17/21 through 02/16/22  Fund:  [name] Phone [phone] Fax [phone] 02/16/2021 3:55 PM

## 2021-02-17 DIAGNOSIS — L209 Atopic dermatitis, unspecified: Secondary | ICD-10-CM | POA: Diagnosis not present

## 2021-03-04 ENCOUNTER — Other Ambulatory Visit: Payer: Self-pay | Admitting: Oncology

## 2021-03-04 ENCOUNTER — Other Ambulatory Visit (HOSPITAL_COMMUNITY): Payer: Self-pay

## 2021-03-04 DIAGNOSIS — C9110 Chronic lymphocytic leukemia of B-cell type not having achieved remission: Secondary | ICD-10-CM

## 2021-03-04 MED ORDER — IMBRUVICA 420 MG PO TABS
ORAL_TABLET | ORAL | 0 refills | Status: DC
  Filled 2021-03-04: qty 28, 28d supply, fill #0

## 2021-03-08 ENCOUNTER — Other Ambulatory Visit (HOSPITAL_COMMUNITY): Payer: Self-pay

## 2021-03-10 ENCOUNTER — Other Ambulatory Visit (HOSPITAL_COMMUNITY): Payer: Self-pay

## 2021-03-10 ENCOUNTER — Encounter: Payer: Self-pay | Admitting: Oncology

## 2021-03-16 ENCOUNTER — Other Ambulatory Visit: Payer: Self-pay

## 2021-03-16 ENCOUNTER — Other Ambulatory Visit: Payer: Self-pay | Admitting: Internal Medicine

## 2021-03-16 ENCOUNTER — Ambulatory Visit (INDEPENDENT_AMBULATORY_CARE_PROVIDER_SITE_OTHER): Payer: PPO

## 2021-03-16 ENCOUNTER — Ambulatory Visit: Payer: PPO | Admitting: Internal Medicine

## 2021-03-16 ENCOUNTER — Encounter: Payer: Self-pay | Admitting: Internal Medicine

## 2021-03-16 DIAGNOSIS — J90 Pleural effusion, not elsewhere classified: Secondary | ICD-10-CM

## 2021-03-16 NOTE — Assessment & Plan Note (Signed)
Onset of symptoms Feb 2021  -R thoracentesis 08/12/2019 :  1 liter   Exudative, nl glucose,  11k lymphs > flow cyt c/w lymphoma > referred back to oncology and hold off on R tap as feeling much better as of 08/14/2019  - venous dopplers rec due to D dimer 0.95 >>>  08/14/2019 neg bilaterally  - R Tcentesis 07/05/2020 :  800 cc: neg for lymphoma/ more transudative features though borderline - 3/24 /22 start Aldactazide  25/25 one twice daily  - repeat cxr 07/28/2020 > improved so continue aldactazide 25/25 one daily > "too dry" - 08/26/2020 recurrent  R > L  efffusions off rx so restart hctz 25 mg daily  -  Echo  09/08/2020   wnl  - 09/23/2020 fluid building back up with R pseudotumor on 50 mg hctz daily so try restart aldactazide 25-25 one daily  - 11/05/2020 pseudotumor resolved but effusions persist - not taking aldactazide consistently  - 12/17/2020 fluid slt reduced on aldatazide 25/25 one every 3d day but slt increase in R > L leg edema  - 03/16/2021 improved on Aldactazide 25/25 "most days"   Well compensated  Clinically and I believe improve radiogrphically on above rx with adverse effects on bp or renal function/ K levels  Working dx is effusions may be related to prior dx of lymphoma (? Affecting lymphatic drainage still despite good clinical response to systemic rx ) or low albumin though this had trended up nicely.  Therefore f/u per oncology with freq labs and we'll see her back here q 6 m, sooner prn         Each maintenance medication was reviewed in detail including emphasizing most importantly the difference between maintenance and prns and under what circumstances the prns are to be triggered using an action plan format where appropriate.  Total time for H and P, chart review, counseling,   and generating customized AVS unique to this office visit / same day charting = 25 min

## 2021-03-16 NOTE — Progress Notes (Signed)
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 cxr showed fluid rec no rx per pt   referred to pulmonary clinic 08/07/2019 by Dr   [name]  2nd covid  shot March 7th 2021 > rx 10 days abx for ? Infection at site  = doxycycline   History of Present Illness  08/07/2019  Pulmonary/ 1st office eval/[name] RA pt with effusion but RA symptoms are better  Chief Complaint  Patient presents with   Consult    Referred by Dr. [name] for pleural effusion and cough. Non productive cough.   Dyspnea:  No MMRC2 = can't walk a nl pace on a flat grade s sob but does fine slow and flat [employer] Cough: dry/ worse with talking/ laughing  Sleep: avg 45 degrees x 6 months due to breathing better elevated SABA use: none Worse than usual swelling R > L first noted in 2016  rec Add:  No obvious dx with bilateral effusions likely worse on L > proceed with dx/therapeutic L t centesis  > pos for lymphoma     07/01/2020    ov/[name] re: still 3 days left on levaquin, mucus is clear  Was slt green prior to levaquin Chief Complaint  Patient presents with   Acute Visit    Had covid 19 end of Dec 2021- She states has had cough since then. She has had SOB over the past 2 wks- currently on levaquin 500 per PCP for PNA. Her cough is occ prod with clear to yellow sputum.    Dyspnea: walking room to room inside house, no grocery in over a week  Cough: rattling /clear mucus  Sleeping:  almost upright x 2 m SABA use: none  02: none  Covid status:   vax x 3 last  12/29/19  rec For cough > hydromet  1-2 tsp every 4-6 hours Ordered R Tcentesis 07/05/2020 :  800 cc: neg for lymphoma/ more transudative features though borderline     07/12/2020  f/u ov/[name] re: bilteral  effusion/ cough x since covid dec 2021  Back on Imbruvica   Chief Complaint  Patient presents with   Follow-up    Cough  Dyspnea:  Grocery shopping easier , can take deeper breaths but then this makes her cough Cough: clear mucus from nose, no longer green, nonstop daytime cough no better with hycodan  Sleeping:  having to sit almost upright due to cough, not sob  SABA use: none  02: none  Covid status:  All 3 vaccines  Assoc R > L leg swelling  rec Aldactazide  25/25 one twice daily  Gabapentin 100 mg three times a week and if not better after a week take 2 three times daily  Late add: also rec  Try prilosec otc 20mg   Take 30-60 min before first meal of the day and Pepcid ac (famotidine)  20 mg one @  Bedtime > never started     07/28/2020  f/u ov/[name] re: bilateral effusions reduced the the aldactazide to one daily / now on gabapentin 100 mg one daily x 4-5 days  Chief Complaint  Patient presents with   Follow-up    Feeling better 95%, no cough, decreased ankle edema  Dyspnea:  Grocery shopping / parking lot with sats around 90%  Cough: gone  Sleeping:  45 degrees in recliner x 2 years  SABA use: inhalers 02: none  Covid status:   vax x 3 pfizer  Leg swelling resolved  Drinking lots of water on hctz/ advised to use hard rock candies for dry mouth  rec Use jolly ranchers or lifesavers instead of water   For cough > take gabapentin 100 mg up to 3 x daily as needed    08/26/2020  f/u ov/[name] re: bilateral effusions/  ST / chills April 3rd pos covid  Chief Complaint  Patient presents with   Follow-up    Patient wants to talk about Aldactazide medication and go over chest xray.   Dyspnea:   Able  to do grocery, cross parking lots  Cough: minimal pnds/ chest congestion but no mucus  Sleeping: 45 degrees = baselie SABA use: none  02: none  Covid status:   Pfizer x 3  rec Restart HCTZ  25 mg daily  Use jolly ranchers or lifesavers instead of water   For cough or throat congestion or clearing  > take gabapentin 100 mg up to 3 x daily as needed Please schedule a follow up office visit in 4 weeks, sooner if needed with cxr on return      Echo  09/08/2020   wnl    09/23/2020  f/u ov/[name] re:  B pleural effusions/ peripheral  Edema some better on 50 mg hctz daily x one week Chief Complaint  Patient presents with   Follow-up    Continuing to have shortness of breath with exertion.   Dyspnea:  Some worse crossing parking lot/ [employer] Cough: much better on 100 mg bid gabapentin mostly daytime throat clearing Sleeping: still 45 degrees  SABA use: none 02: none  Covid status:   Pfizer x 3  Rec Change back to aldactazide 25/25 one daily and weigh every days with target about 2 lbs less      11/05/2020  f/u ov/[name] re:  Bilateral effusions, cough is gone  - stopped aldactazide completely and swelling returned  Dyspnea:  mainly when talk a lot/ still doing [employer] about the same pace   Cough: gone Sleeping: almost flat  SABA use: none  02: none Covid status:   vax x 4  Rec Please remember to go to the lab and x-ray department  for your tests - we will call you with the results when they are available. Please schedule a follow up office visit in 6 weeks, call sooner if needed with cxr on return    12/17/2020  f/u ov/[name] re: bilateral effusions   maint on aldactazide 25/25  one every 3rd day  Chief Complaint  Patient presents with   Follow-up    Patient reports that she talk about her mitral valve prolapse.     Dyspnea:  limited by palpitations p lopressor  Cough: none  Sleeping: 30 degrees now slt lower  SABA use: none  02: none  Covid status:   vax x 4  Rec No change in  your medications Discuss your palpitations with Dr  Crenshaw Please schedule a follow up visit in 3 months but call sooner if needed with cxr on return   03/16/2021  f/u ov/Meshia Rau re: bilateral effusions in setting of prior dx of lymphoma on R   maint on one aldactazide daily   Chief Complaint  Patient presents with   Follow-up    CXR repeated today. Breathing is doing well today and she has no new co's.    Dyspnea:  now able to do steps  Cough: no  Sleeping: now can flat bed but got used to sleeping at 30 degrees and left it there  SABA use: none 02: none  Covid status:   vax x 5    No obvious day to day or daytime variability or assoc excess/ purulent sputum or mucus plugs or hemoptysis or cp or chest tightness, subjective wheeze or overt sinus or hb symptoms.   Sleeping  without nocturnal  or early am exacerbation  of respiratory  c/o's or need for noct saba. Also denies any obvious fluctuation of symptoms with weather or environmental changes or other aggravating or alleviating factors except as outlined above   No unusual exposure hx or h/o childhood pna/ asthma or knowledge of premature birth.  Current Allergies, Complete Past Medical History, Past Surgical History, Family History, and Social History were reviewed in [employer] record.  ROS  The following are not active complaints unless bolded Hoarseness, sore throat, dysphagia, dental problems, itching, sneezing,  nasal congestion or discharge of excess mucus or purulent secretions, ear ache,   fever, chills, sweats, unintended wt loss or wt gain, classically pleuritic or exertional cp,  orthopnea pnd or arm/hand swelling  or leg swelling, presyncope, palpitations, abdominal pain, anorexia, nausea, vomiting, diarrhea  or change in bowel habits or change in bladder habits, change in stools or change in urine, dysuria, hematuria,  rash, arthralgias, visual complaints, headache, numbness, weakness or ataxia or  problems with walking or coordination,  change in mood or  memory.        Current Meds  Medication Sig   escitalopram (LEXAPRO) 5 MG tablet Take 5 mg by mouth daily.   ibrutinib (IMBRUVICA) 420 MG TABS TAKE 1 TABLET (420 MG) BY MOUTH DAILY.   ibrutinib 420 MG TABS Take 1 tablet by mouth daily.   levothyroxine (SYNTHROID, LEVOTHROID) 25 MCG tablet Take 25 mcg by mouth daily before breakfast.    metoprolol succinate (TOPROL XL) 25 MG 24 hr tablet Take 1 tablet (25 mg total) by mouth daily.   spironolactone-hydrochlorothiazide (ALDACTAZIDE) 25-25 MG tablet Take 1 tablet by mouth daily.             Past Medical History:  Diagnosis Date   CLL (chronic lymphocytic leukemia) ([facility])    Hypertension    Hypothyroid    MVP (mitral valve prolapse)    Rheumatoid arthritis (HCC)        Objective:    03/16/2021       144   12/17/2020        141  11/05/2020          142 09/23/2020        151 08/26/2020        152 07/28/2020        144 07/12/2020        159  07/01/20 163 lb 6.4 oz (74.1 kg)  05/20/20 164 lb (74.4 kg)  03/17/20 163 lb (73.9 kg)     Vital signs reviewed  03/16/2021  -  Note at rest 02 sats  97% on RA   General appearance:    amb pleasant wf     HEENT : pt wearing mask not removed for exam due to covid -19 concerns.    NECK :  without JVD/Nodes/TM/ nl carotid upstrokes bilaterally   LUNGS: no acc muscle use,  Nl contour chest  decreased bs/dullness R > L base  without cough on insp or exp maneuvers   CV:  RRR  no s3 or murmur or increase in P2, and trace pitting edema R > L ankles  ABD:  soft and nontender with nl inspiratory excursion in the supine position. No bruits or organomegaly appreciated, bowel sounds nl  MS:  Nl gait/ ext warm without deformities, calf tenderness, cyanosis or clubbing No obvious joint restrictions   SKIN: warm and dry without lesions    NEURO:  alert, approp, nl sensorium with  no motor or cerebellar deficits apparent.          CXR PA  and Lateral:   03/16/2021 :    I personally reviewed images and impression is as follows:     PA not much different but on  lateral view def appears less volume of effusion       Assessment

## 2021-03-16 NOTE — Patient Instructions (Signed)
No change in medications for now   Please schedule a follow up visit in 6  months but call sooner if needed with cxr on return

## 2021-03-21 ENCOUNTER — Ambulatory Visit: Payer: PPO | Admitting: Internal Medicine

## 2021-04-01 ENCOUNTER — Other Ambulatory Visit: Payer: Self-pay | Admitting: Oncology

## 2021-04-01 ENCOUNTER — Other Ambulatory Visit (HOSPITAL_COMMUNITY): Payer: Self-pay

## 2021-04-01 DIAGNOSIS — C9110 Chronic lymphocytic leukemia of B-cell type not having achieved remission: Secondary | ICD-10-CM

## 2021-04-01 MED ORDER — IMBRUVICA 420 MG PO TABS
ORAL_TABLET | ORAL | 0 refills | Status: DC
  Filled 2021-04-01: qty 28, 28d supply, fill #0

## 2021-04-04 ENCOUNTER — Other Ambulatory Visit (HOSPITAL_COMMUNITY): Payer: Self-pay

## 2021-04-07 DIAGNOSIS — E039 Hypothyroidism, unspecified: Secondary | ICD-10-CM | POA: Diagnosis not present

## 2021-04-21 ENCOUNTER — Other Ambulatory Visit: Payer: Self-pay

## 2021-04-21 ENCOUNTER — Inpatient Hospital Stay: Payer: PPO | Admitting: Oncology

## 2021-04-21 ENCOUNTER — Inpatient Hospital Stay: Payer: PPO | Attending: Oncology

## 2021-04-21 VITALS — BP 125/56 | HR 60 | Temp 97.8°F | Resp 17 | Ht 66.5 in | Wt 150.2 lb

## 2021-04-21 DIAGNOSIS — J90 Pleural effusion, not elsewhere classified: Secondary | ICD-10-CM | POA: Diagnosis not present

## 2021-04-21 DIAGNOSIS — C9110 Chronic lymphocytic leukemia of B-cell type not having achieved remission: Secondary | ICD-10-CM | POA: Diagnosis not present

## 2021-04-21 DIAGNOSIS — D649 Anemia, unspecified: Secondary | ICD-10-CM | POA: Diagnosis not present

## 2021-04-21 DIAGNOSIS — M069 Rheumatoid arthritis, unspecified: Secondary | ICD-10-CM | POA: Diagnosis not present

## 2021-04-21 LAB — CBC WITH DIFFERENTIAL (CANCER CENTER ONLY)
Abs Immature Granulocytes: 0.03 10*3/uL (ref 0.00–0.07)
Basophils Absolute: 0.1 10*3/uL (ref 0.0–0.1)
Basophils Relative: 1 %
Eosinophils Absolute: 0.2 10*3/uL (ref 0.0–0.5)
Eosinophils Relative: 3 %
HCT: 35.4 % — ABNORMAL LOW (ref 36.0–46.0)
Hemoglobin: 11.2 g/dL — ABNORMAL LOW (ref 12.0–15.0)
Immature Granulocytes: 0 %
Lymphocytes Relative: 22 %
Lymphs Abs: 1.6 10*3/uL (ref 0.7–4.0)
MCH: 27.6 pg (ref 26.0–34.0)
MCHC: 31.6 g/dL (ref 30.0–36.0)
MCV: 87.2 fL (ref 80.0–100.0)
Monocytes Absolute: 1.1 10*3/uL — ABNORMAL HIGH (ref 0.1–1.0)
Monocytes Relative: 15 %
Neutro Abs: 4.3 10*3/uL (ref 1.7–7.7)
Neutrophils Relative %: 59 %
Platelet Count: 223 10*3/uL (ref 150–400)
RBC: 4.06 MIL/uL (ref 3.87–5.11)
RDW: 16.3 % — ABNORMAL HIGH (ref 11.5–15.5)
WBC Count: 7.3 10*3/uL (ref 4.0–10.5)
nRBC: 0 % (ref 0.0–0.2)

## 2021-04-21 LAB — CMP (CANCER CENTER ONLY)
ALT: 13 U/L (ref 0–44)
AST: 15 U/L (ref 15–41)
Albumin: 3.5 g/dL (ref 3.5–5.0)
Alkaline Phosphatase: 60 U/L (ref 38–126)
Anion gap: 3 — ABNORMAL LOW (ref 5–15)
BUN: 21 mg/dL (ref 8–23)
CO2: 30 mmol/L (ref 22–32)
Calcium: 9 mg/dL (ref 8.9–10.3)
Chloride: 106 mmol/L (ref 98–111)
Creatinine: 0.82 mg/dL (ref 0.44–1.00)
GFR, Estimated: >60 mL/min (ref >60)
Glucose, Bld: 83 mg/dL (ref 70–99)
Potassium: 4.5 mmol/L (ref 3.5–5.1)
Sodium: 139 mmol/L (ref 135–145)
Total Bilirubin: 0.7 mg/dL (ref 0.3–1.2)
Total Protein: 5.5 g/dL — ABNORMAL LOW (ref 6.5–8.1)

## 2021-04-21 NOTE — Progress Notes (Signed)
Hematology and Oncology Follow Up Visit  Nicole Campbell [id number] 1945/06/17 75 y.o. 04/21/2021 9:38 AM Nicole Campbell, MDSmith, [name], MD   Principle Diagnosis: 75 year old woman with CLL diagnosed in 2019 after presenting with lymphadenopathy and leukocytosis.  She was found to have presented p53 deletion, deletion of ATM.  Prior therapy:   She is status post rituximab weekly for 4 weeks completed on August 21, 2018. She is status post thoracentesis completed on August 12, 2019 with positive cytology. Rituximab 375 mg per metered square weekly started on October 13 of 2020 and completed on 03/04/2019.  This will be repeated every 3 months last treatment completed in March 2021. Bendamustine and rituximab started on Sep 03, 2019.  She is here for cycle 3 of therapy.  Current therapy: Ibrutinib 420 mg daily started in July 2020.      Interim History: Ms. Nicole Campbell is here for return follow-up.  Since last visit, she reports no major changes in her health.  She continues to improve overall with decreasing her shortness of breath and difficulty breathing.  She is no longer requiring thoracentesis at this time.  She has tolerated ibrutinib without any issues.  She denies any nausea, abdominal pain, palpitation.  He denies any worsening diarrhea or bleeding issues.         .    Medications: Reviewed without changes. Current Outpatient Medications  Medication Sig Dispense Refill   escitalopram (LEXAPRO) 5 MG tablet Take 5 mg by mouth daily.     ibrutinib (IMBRUVICA) 420 MG TABS TAKE 1 TABLET (420 MG) BY MOUTH DAILY. 28 tablet 0   ibrutinib 420 MG TABS Take 1 tablet by mouth daily.     levothyroxine (SYNTHROID, LEVOTHROID) 25 MCG tablet Take 25 mcg by mouth daily before breakfast.      metoprolol succinate (TOPROL XL) 25 MG 24 hr tablet Take 1 tablet (25 mg total) by mouth daily. 90 tablet 3   spironolactone-hydrochlorothiazide (ALDACTAZIDE) 25-25 MG tablet Take 1 tablet by  mouth daily.     No current facility-administered medications for this visit.     Allergies:  Allergies  Allergen Reactions   Methotrexate Other (See Comments)    Other reaction(s): horrible lower back pain   Erythromycin Palpitations        Physical Exam:    Blood pressure (!) 125/56, pulse 60, temperature 97.8 F (36.6 C), temperature source Temporal, resp. rate 17, height 5' 6.5" (1.689 m), weight 150 lb 3.2 oz (68.1 kg), SpO2 100 %.         ECOG:1       General appearance: Alert, awake without any distress. Head: Atraumatic without abnormalities Oropharynx: Without any thrush or ulcers. Eyes: No scleral icterus. Lymph nodes: No lymphadenopathy noted in the cervical, supraclavicular, or axillary nodes Heart:regular rate and rhythm, without any murmurs or gallops.   Lung: Clear to auscultation without any rhonchi, wheezes or dullness to percussion. Abdomin: Soft, nontender without any shifting dullness or ascites. Musculoskeletal: No clubbing or cyanosis. Neurological: No motor or sensory deficits. Skin: No rashes or lesions. Psychiatric: Mood and affect appeared normal.                        Lab Results: Lab Results  Component Value Date   WBC 5.7 01/19/2021   HGB 11.3 (L) 01/19/2021   HCT 35.4 (L) 01/19/2021   MCV 81.4 01/19/2021   PLT 235 01/19/2021     Chemistry      Component  Value Date/Time   NA 142 01/19/2021 1314   K 4.4 01/19/2021 1314   CL 103 01/19/2021 1314   CO2 30 01/19/2021 1314   BUN 12 01/19/2021 1314   CREATININE 0.76 01/19/2021 1314      Component Value Date/Time   CALCIUM 9.6 01/19/2021 1314   ALKPHOS 70 01/19/2021 1314   AST 11 (L) 01/19/2021 1314   ALT 11 01/19/2021 1314   BILITOT 0.6 01/19/2021 13129     :   75 year old woman with:  1.  CLL diagnosed in 2019 with a lymphocytosis and adenopathy.    Her disease status was updated at this time and treatment choices were reviewed.  Risks and  benefits of continuing ibrutinib were discussed at this time.  Alternative treatment options including systemic chemotherapy, immunotherapy among other agents were reiterated.  Complications such as cardiac arrhythmia and bleeding were reviewed and she has not experienced any at this time.  She is willing to continue.  Laboratory data from today continues to show complete hematological remission.   2.  Rheumatoid arthritis: No recent exacerbation noted.  He is no longer requiring any treatment.   3.  Bilateral pleural effusion: Chest x-ray on March 16, 2021 showed very little residual effusion.  4.  Anemia: Her hemoglobin is 11.2 and remained stable.  No additional intervention is needed.  5.  Follow-up: In 12 weeks for repeat follow-up.   30  minutes were dedicated to this visit.  Time spent on reviewing laboratory data, disease status update, treatment choices and addressing complications related to her diagnosis of therapy.   Zola Button, MD 12/22/20229:38 AM

## 2021-04-28 ENCOUNTER — Other Ambulatory Visit: Payer: Self-pay | Admitting: Oncology

## 2021-04-28 ENCOUNTER — Other Ambulatory Visit (HOSPITAL_COMMUNITY): Payer: Self-pay

## 2021-04-28 DIAGNOSIS — C9110 Chronic lymphocytic leukemia of B-cell type not having achieved remission: Secondary | ICD-10-CM

## 2021-04-28 MED ORDER — IMBRUVICA 420 MG PO TABS
ORAL_TABLET | ORAL | 0 refills | Status: DC
  Filled 2021-04-28: qty 28, 28d supply, fill #0

## 2021-05-03 ENCOUNTER — Encounter: Payer: Self-pay | Admitting: Oncology

## 2021-05-03 ENCOUNTER — Other Ambulatory Visit (HOSPITAL_COMMUNITY): Payer: Self-pay

## 2021-05-05 DIAGNOSIS — J0100 Acute maxillary sinusitis, unspecified: Secondary | ICD-10-CM | POA: Diagnosis not present

## 2021-05-24 ENCOUNTER — Other Ambulatory Visit: Payer: Self-pay | Admitting: Oncology

## 2021-05-24 ENCOUNTER — Other Ambulatory Visit (HOSPITAL_COMMUNITY): Payer: Self-pay

## 2021-05-24 DIAGNOSIS — C9110 Chronic lymphocytic leukemia of B-cell type not having achieved remission: Secondary | ICD-10-CM

## 2021-05-24 MED ORDER — IMBRUVICA 420 MG PO TABS
ORAL_TABLET | ORAL | 0 refills | Status: DC
Start: 2021-05-24 — End: 2021-06-20
  Filled 2021-05-24: qty 28, 28d supply, fill #0

## 2021-05-30 ENCOUNTER — Other Ambulatory Visit (HOSPITAL_COMMUNITY): Payer: Self-pay

## 2021-05-31 NOTE — Progress Notes (Unsigned)
HPI: FU palpitations. CTA August 2020 showed no aneurysm or dissection.  There was increase in lymphadenopathy (Pt with known CLL).  Venous Dopplers April 2021 showed no DVT.  Chest CT April 2021 showed marked progression of the adenopathy consistent with lymphoma.  There was note of new small to moderate bilateral pleural effusions. Patient had thoracentesis and cytology consistent with lymphoma. Monitor May 2021 showed sinus with occasional PACs, brief PAT and PVCs. Echocardiogram May 2022 showed normal LV function, grade 1 diastolic dysfunction.  Chest x-ray November 2022 showed small bilateral pleural effusions..  Since last seen   Current Outpatient Medications  Medication Sig Dispense Refill   escitalopram (LEXAPRO) 5 MG tablet Take 5 mg by mouth daily.     ibrutinib (IMBRUVICA) 420 MG TABS TAKE 1 TABLET (420 MG) BY MOUTH DAILY. 28 tablet 0   ibrutinib 420 MG TABS Take 1 tablet by mouth daily.     levothyroxine (SYNTHROID, LEVOTHROID) 25 MCG tablet Take 25 mcg by mouth daily before breakfast.      metoprolol succinate (TOPROL XL) 25 MG 24 hr tablet Take 1 tablet (25 mg total) by mouth daily. 90 tablet 3   spironolactone-hydrochlorothiazide (ALDACTAZIDE) 25-25 MG tablet Take 1 tablet by mouth daily.     No current facility-administered medications for this visit.     Past Medical History:  Diagnosis Date   CLL (chronic lymphocytic leukemia) ([facility])    Hypertension    Hypothyroid    MVP (mitral valve prolapse)    Rheumatoid arthritis ([place])     Past Surgical History:  Procedure Laterality Date   ABDOMINAL HYSTERECTOMY     IR THORACENTESIS ASP PLEURAL SPACE W/IMG GUIDE  11/21/2019   IR THORACENTESIS ASP PLEURAL SPACE W/IMG GUIDE  07/05/2020   TONSILLECTOMY      Social History   Socioeconomic History   Marital status: Married    Spouse name: Not on file   Number of children: 3   Years of education: Not on file   Highest education level: Not on file  Occupational History    Not on file  Tobacco Use   Smoking status: Never   Smokeless tobacco: Never  Vaping Use   Vaping Use: Never used  Substance and Sexual Activity   Alcohol use: Yes    Comment: Rare   Drug use: Never   Sexual activity: Not on file  Other Topics Concern   Not on file  Social History Narrative   Not on file   Social Determinants of Health   Financial Resource Strain: Not on file  Food Insecurity: Not on file  Transportation Needs: Not on file  Physical Activity: Not on file  Stress: Not on file  Social Connections: Not on file  Intimate Partner Violence: Not on file    Family History  Problem Relation Age of Onset   Congestive Heart Failure Father     ROS: no fevers or chills, productive cough, hemoptysis, dysphasia, odynophagia, melena, hematochezia, dysuria, hematuria, rash, seizure activity, orthopnea, PND, pedal edema, claudication. Remaining systems are negative.  Physical Exam: Well-developed well-nourished in no acute distress.  Skin is warm and dry.  HEENT is normal.  Neck is supple.  Chest is clear to auscultation with normal expansion.  Cardiovascular exam is regular rate and rhythm.  Abdominal exam nontender or distended. No masses palpated. Extremities show no edema. neuro grossly intact  ECG- personally reviewed  A/P  1 palpitations-previous monitor revealed PACs, PVCs and PAT.  Plan to  continue beta-blocker at present dose.  2 mitral valve prolapse-not evident on most recent echocardiogram.  3 recurrent lymphoma-followed by oncology.  Kirk Ruths, MD

## 2021-06-06 ENCOUNTER — Encounter: Payer: Self-pay | Admitting: Oncology

## 2021-06-06 ENCOUNTER — Other Ambulatory Visit (HOSPITAL_COMMUNITY): Payer: Self-pay

## 2021-06-07 ENCOUNTER — Telehealth: Payer: Self-pay | Admitting: Oncology

## 2021-06-07 NOTE — Telephone Encounter (Signed)
Called patient regarding upcoming appointment, patient is notified. °

## 2021-06-13 ENCOUNTER — Ambulatory Visit: Payer: PPO | Admitting: Cardiology

## 2021-06-16 ENCOUNTER — Inpatient Hospital Stay (HOSPITAL_BASED_OUTPATIENT_CLINIC_OR_DEPARTMENT_OTHER)
Admission: EM | Admit: 2021-06-16 | Discharge: 2021-06-20 | DRG: 871 | Disposition: A | Discharge status: Home / Self Care | Payer: PPO | Attending: Internal Medicine | Admitting: Internal Medicine

## 2021-06-16 ENCOUNTER — Other Ambulatory Visit: Payer: Self-pay

## 2021-06-16 ENCOUNTER — Encounter (HOSPITAL_BASED_OUTPATIENT_CLINIC_OR_DEPARTMENT_OTHER): Payer: Self-pay

## 2021-06-16 ENCOUNTER — Emergency Department (HOSPITAL_BASED_OUTPATIENT_CLINIC_OR_DEPARTMENT_OTHER): Payer: PPO

## 2021-06-16 DIAGNOSIS — E039 Hypothyroidism, unspecified: Secondary | ICD-10-CM | POA: Diagnosis present

## 2021-06-16 DIAGNOSIS — I110 Hypertensive heart disease with heart failure: Secondary | ICD-10-CM | POA: Diagnosis present

## 2021-06-16 DIAGNOSIS — Z881 Allergy status to other antibiotic agents status: Secondary | ICD-10-CM | POA: Diagnosis not present

## 2021-06-16 DIAGNOSIS — Z8249 Family history of ischemic heart disease and other diseases of the circulatory system: Secondary | ICD-10-CM | POA: Diagnosis not present

## 2021-06-16 DIAGNOSIS — R5081 Fever presenting with conditions classified elsewhere: Secondary | ICD-10-CM | POA: Diagnosis not present

## 2021-06-16 DIAGNOSIS — D709 Neutropenia, unspecified: Secondary | ICD-10-CM | POA: Diagnosis not present

## 2021-06-16 DIAGNOSIS — Z79890 Hormone replacement therapy (postmenopausal): Secondary | ICD-10-CM

## 2021-06-16 DIAGNOSIS — J329 Chronic sinusitis, unspecified: Secondary | ICD-10-CM | POA: Diagnosis present

## 2021-06-16 DIAGNOSIS — E861 Hypovolemia: Secondary | ICD-10-CM | POA: Diagnosis present

## 2021-06-16 DIAGNOSIS — Z79899 Other long term (current) drug therapy: Secondary | ICD-10-CM

## 2021-06-16 DIAGNOSIS — Z20822 Contact with and (suspected) exposure to covid-19: Secondary | ICD-10-CM | POA: Diagnosis present

## 2021-06-16 DIAGNOSIS — E876 Hypokalemia: Secondary | ICD-10-CM | POA: Diagnosis present

## 2021-06-16 DIAGNOSIS — A419 Sepsis, unspecified organism: Secondary | ICD-10-CM | POA: Diagnosis present

## 2021-06-16 DIAGNOSIS — I9589 Other hypotension: Secondary | ICD-10-CM | POA: Diagnosis present

## 2021-06-16 DIAGNOSIS — I509 Heart failure, unspecified: Secondary | ICD-10-CM | POA: Diagnosis not present

## 2021-06-16 DIAGNOSIS — L0201 Cutaneous abscess of face: Secondary | ICD-10-CM | POA: Diagnosis not present

## 2021-06-16 DIAGNOSIS — I5032 Chronic diastolic (congestive) heart failure: Secondary | ICD-10-CM | POA: Diagnosis present

## 2021-06-16 DIAGNOSIS — R509 Fever, unspecified: Secondary | ICD-10-CM | POA: Diagnosis not present

## 2021-06-16 DIAGNOSIS — C9110 Chronic lymphocytic leukemia of B-cell type not having achieved remission: Secondary | ICD-10-CM | POA: Diagnosis present

## 2021-06-16 DIAGNOSIS — E871 Hypo-osmolality and hyponatremia: Secondary | ICD-10-CM | POA: Diagnosis present

## 2021-06-16 DIAGNOSIS — R6521 Severe sepsis with septic shock: Secondary | ICD-10-CM | POA: Diagnosis present

## 2021-06-16 DIAGNOSIS — N179 Acute kidney failure, unspecified: Secondary | ICD-10-CM | POA: Diagnosis present

## 2021-06-16 DIAGNOSIS — M069 Rheumatoid arthritis, unspecified: Secondary | ICD-10-CM | POA: Diagnosis present

## 2021-06-16 DIAGNOSIS — Z90710 Acquired absence of both cervix and uterus: Secondary | ICD-10-CM

## 2021-06-16 DIAGNOSIS — Z888 Allergy status to other drugs, medicaments and biological substances status: Secondary | ICD-10-CM | POA: Diagnosis not present

## 2021-06-16 DIAGNOSIS — J90 Pleural effusion, not elsewhere classified: Secondary | ICD-10-CM | POA: Diagnosis not present

## 2021-06-16 DIAGNOSIS — J0100 Acute maxillary sinusitis, unspecified: Secondary | ICD-10-CM | POA: Diagnosis not present

## 2021-06-16 DIAGNOSIS — I081 Rheumatic disorders of both mitral and tricuspid valves: Secondary | ICD-10-CM | POA: Diagnosis present

## 2021-06-16 LAB — BASIC METABOLIC PANEL
Anion gap: 11 (ref 5–15)
BUN: 19 mg/dL (ref 8–23)
CO2: 24 mmol/L (ref 22–32)
Calcium: 8.1 mg/dL — ABNORMAL LOW (ref 8.9–10.3)
Chloride: 90 mmol/L — ABNORMAL LOW (ref 98–111)
Creatinine, Ser: 1.15 mg/dL — ABNORMAL HIGH (ref 0.44–1.00)
GFR, Estimated: 50 mL/min — ABNORMAL LOW (ref >60)
Glucose, Bld: 115 mg/dL — ABNORMAL HIGH (ref 70–99)
Potassium: 3.6 mmol/L (ref 3.5–5.1)
Sodium: 125 mmol/L — ABNORMAL LOW (ref 135–145)

## 2021-06-16 LAB — CBC WITH DIFFERENTIAL/PLATELET
Abs Immature Granulocytes: 0.01 10*3/uL (ref 0.00–0.07)
Basophils Absolute: 0 10*3/uL (ref 0.0–0.1)
Basophils Relative: 1 %
Eosinophils Absolute: 0 10*3/uL (ref 0.0–0.5)
Eosinophils Relative: 0 %
HCT: 35.2 % — ABNORMAL LOW (ref 36.0–46.0)
Hemoglobin: 11.7 g/dL — ABNORMAL LOW (ref 12.0–15.0)
Immature Granulocytes: 0 %
Lymphocytes Relative: 13 %
Lymphs Abs: 0.4 10*3/uL — ABNORMAL LOW (ref 0.7–4.0)
MCH: 28.5 pg (ref 26.0–34.0)
MCHC: 33.2 g/dL (ref 30.0–36.0)
MCV: 85.9 fL (ref 80.0–100.0)
Monocytes Absolute: 2.5 10*3/uL — ABNORMAL HIGH (ref 0.1–1.0)
Monocytes Relative: 83 %
Neutro Abs: 0.1 10*3/uL — CL (ref 1.7–7.7)
Neutrophils Relative %: 3 %
Platelets: 212 10*3/uL (ref 150–400)
RBC: 4.1 MIL/uL (ref 3.87–5.11)
RDW: 13.6 % (ref 11.5–15.5)
Smear Review: NORMAL
WBC: 3.1 10*3/uL — ABNORMAL LOW (ref 4.0–10.5)
nRBC: 0 % (ref 0.0–0.2)

## 2021-06-16 LAB — RESP PANEL BY RT-PCR (FLU A&B, COVID) ARPGX2
Influenza A by PCR: NEGATIVE
Influenza B by PCR: NEGATIVE
SARS Coronavirus 2 by RT PCR: NEGATIVE

## 2021-06-16 LAB — GROUP A STREP BY PCR: Group A Strep by PCR: NOT DETECTED

## 2021-06-16 LAB — LACTIC ACID, PLASMA: Lactic Acid, Venous: 1.7 mmol/L (ref 0.5–1.9)

## 2021-06-16 MED ORDER — VANCOMYCIN HCL 1750 MG/350ML IV SOLN
1750.0000 mg | INTRAVENOUS | Status: DC
  Filled 2021-06-16: qty 350

## 2021-06-16 MED ORDER — ACETAMINOPHEN 325 MG PO TABS
650.0000 mg | ORAL_TABLET | Freq: Once | ORAL | Status: AC
  Administered 2021-06-16: 650 mg via ORAL
  Filled 2021-06-16: qty 2

## 2021-06-16 MED ORDER — NOREPINEPHRINE 4 MG/250ML-% IV SOLN
2.0000 ug/min | INTRAVENOUS | Status: DC
  Filled 2021-06-16: qty 250

## 2021-06-16 MED ORDER — SODIUM CHLORIDE 0.9 % IV SOLN
2.0000 g | Freq: Once | INTRAVENOUS | Status: AC
  Administered 2021-06-16: 2 g via INTRAVENOUS
  Filled 2021-06-16: qty 2

## 2021-06-16 MED ORDER — SODIUM CHLORIDE 0.9 % IV BOLUS (SEPSIS)
1000.0000 mL | Freq: Once | INTRAVENOUS | Status: AC
  Administered 2021-06-16: 1000 mL via INTRAVENOUS

## 2021-06-16 MED ORDER — SODIUM CHLORIDE 0.9 % IV SOLN: INTRAVENOUS | Status: DC

## 2021-06-16 MED ORDER — METRONIDAZOLE 500 MG/100ML IV SOLN
500.0000 mg | Freq: Once | INTRAVENOUS | Status: AC
  Administered 2021-06-16: 500 mg via INTRAVENOUS
  Filled 2021-06-16: qty 100

## 2021-06-16 MED ORDER — VANCOMYCIN HCL IN DEXTROSE 1-5 GM/200ML-% IV SOLN
1000.0000 mg | Freq: Once | INTRAVENOUS | Status: AC
  Administered 2021-06-16: 1000 mg via INTRAVENOUS
  Filled 2021-06-16: qty 200

## 2021-06-16 MED ORDER — SODIUM CHLORIDE 0.9 % IV SOLN
250.0000 mL | INTRAVENOUS | Status: DC
  Administered 2021-06-16 – 2021-06-17 (×2): 250 mL via INTRAVENOUS

## 2021-06-16 MED ORDER — SODIUM CHLORIDE 0.9 % IV SOLN
2.0000 g | Freq: Two times a day (BID) | INTRAVENOUS | Status: DC
  Administered 2021-06-17 (×2): 2 g via INTRAVENOUS
  Filled 2021-06-16 (×2): qty 2

## 2021-06-16 MED ORDER — SODIUM CHLORIDE 0.9 % IV BOLUS
1000.0000 mL | Freq: Once | INTRAVENOUS | Status: AC
  Administered 2021-06-16: 1000 mL via INTRAVENOUS

## 2021-06-16 NOTE — Progress Notes (Addendum)
eLink Physician-Brief Progress Note Patient Name: Nicole Campbell DOB: 1945/08/04 MRN: [id number]   Date of Service  06/16/2021  HPI/Events of Note  Brief HPI:  H  and P, Notes and labs reviewed. 76 year old female with a history of CLL who presents to the ED today with complaint of flulike symptoms for the past 5 days.  Data: reviewed Sodium 125, co2 at 24, Cr 1.15 WBC 3.1, Hg 11.7, plt 212 K, N 0.1 Group A strep neg CxR effusion. LA 1.7 ECHO EF normal.  Camera evaluation done: On levophed gtt. Vson room air. Awake and alert. 110/54, HR 85, sats 100%.   A/P: Septic shock ANC count of 93. Severe neutropenia. Covered empirically with flagyl, cefepime, and vanc.  - s/p EGDT-fluidswean pressor.  - follow UOP. Keep MAP > 65  Hyponatremia. Follow sodium closely, post fluids. Aspiration and seizure precautions. Covid /flu neg.  BWI:OMBTDHR SQ. To consider.   CBG goals < 180  eICU Interventions       Intervention Category Major Interventions: Hypotension - evaluation and management;Sepsis - evaluation and management Evaluation Type: New Patient Evaluation  Elmer Sow 06/16/2021, 11:35 PM  5:13 MAP soft. Asleep. Got so far 3 lit. Start levophed gtt low dose for now.   Discussed.

## 2021-06-16 NOTE — Plan of Care (Addendum)
Pt with h/o CLL, on imbruvica.  Looks like she has neutropenic fever (total WBC is 3.1k but only 100 ANC with 2.5k "monos" (probably CLL cells).  Got cefepime + flagyl + vanc  Has sore throat, congestion, no headache.  Sending to [facility] IP.  TR[facility] will assume care on arrival to accepting facility. Until arrival, care as per EDP. However, TR[facility] available 24/7 for questions and assistance.  Nursing staff, please page [place] and Consults [phone]) as soon as the patient arrives to the hospital.    Addendum and update: informed by EDP that pt now with hypotension despite IVF, they are starting levophed and calling PCCM.

## 2021-06-16 NOTE — ED Provider Notes (Addendum)
[place] EMERGENCY DEPARTMENT Provider Note   CSN: [id number] Arrival date & time: 06/16/21  1842     History  Chief Complaint  Patient presents with   Flu like symptoms     Nicole Campbell is a 76 y.o. 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 Pt complains of sore throat, chills, congestion, myalgias, fevers, and decreased appetite. She has been taking OTC medications without relief. She called her PCP who prescribed her amoxicillin and doxycycline for possible sinus infection. Husband reports over the past 2 days she has gotten increasingly more weak than normal and has not eaten much of anything. He states he is required to help her up currently which is atypical for her. Pt's husband also reports increased fatigue and sleeping all day. Pt has no other complaints at this time.   The history is provided by the patient, medical records and the spouse.      Home Medications Prior to Admission medications   Medication Sig Start Date End Date Taking? Authorizing Provider  escitalopram (LEXAPRO) 5 MG tablet Take 5 mg by mouth daily. 12/15/20   [provider]  ibrutinib (IMBRUVICA) 420 MG TABS TAKE 1 TABLET (420 MG) BY MOUTH DAILY. 05/24/21 05/24/22  Wyatt Portela, MD  ibrutinib 420 MG TABS Take 1 tablet by mouth daily.    [name], MD  levothyroxine (SYNTHROID, LEVOTHROID) 25 MCG tablet Take 25 mcg by mouth daily before breakfast.  04/30/17   [provider]  metoprolol succinate (TOPROL XL) 25 MG 24 hr tablet Take 1 tablet (25 mg total) by mouth daily. 10/08/20   Lelon Perla, MD  spironolactone-hydrochlorothiazide (ALDACTAZIDE) 25-25 MG tablet Take 1 tablet by mouth daily. 09/30/20   [provider]      Allergies    Methotrexate and Erythromycin    Review of Systems   Review of Systems  Constitutional:  Positive for  appetite change, chills, fatigue and fever.  HENT:  Positive for congestion and sore throat. Negative for ear pain, trouble swallowing and voice change.   Respiratory:  Negative for cough and shortness of breath.   Cardiovascular:  Negative for chest pain.  Gastrointestinal:  Negative for abdominal pain, nausea and vomiting.  Musculoskeletal:  Positive for myalgias.  Neurological:  Negative for headaches.  All other systems reviewed and are negative.  Physical Exam Updated Vital Signs BP (!) 94/48    Pulse 79    Temp 100 F (37.8 C) (Oral)    Resp 19    Ht 5\' 6"  (1.676 m)    Wt 65.8 kg    SpO2 95%    BMI 23.40 kg/m  Physical Exam Vitals and nursing note reviewed.  Constitutional:      Appearance: She is ill-appearing. She is not diaphoretic.  HENT:     Head: Normocephalic and atraumatic.     Mouth/Throat:     Mouth: Mucous membranes are dry.     Comments: Uvula midline. 1+ left tonsillar hypertrophy with erythema. No exudate.  Eyes:     Conjunctiva/sclera: Conjunctivae normal.  Cardiovascular:     Rate and Rhythm: Normal rate and regular rhythm.  Pulmonary:     Effort: Pulmonary effort is normal.     Breath sounds: Normal breath sounds. No wheezing, rhonchi or rales.  Abdominal:     Palpations: Abdomen is soft.     Tenderness:  There is no abdominal tenderness. There is no guarding or rebound.  Musculoskeletal:     Cervical back: Neck supple.  Skin:    General: Skin is warm and dry.  Neurological:     Mental Status: She is alert.    ED Results / Procedures / Treatments   Labs (all labs ordered are listed, but only abnormal results are displayed) Labs Reviewed  BASIC METABOLIC PANEL - Abnormal; Notable for the following components:      Result Value   Sodium 125 (*)    Chloride 90 (*)    Glucose, Bld 115 (*)    Creatinine, Ser 1.15 (*)    Calcium 8.1 (*)    GFR, Estimated 50 (*)    All other components within normal limits  CBC WITH DIFFERENTIAL/PLATELET -  Abnormal; Notable for the following components:   WBC 3.1 (*)    Hemoglobin 11.7 (*)    HCT 35.2 (*)    Neutro Abs 0.1 (*)    Lymphs Abs 0.4 (*)    Monocytes Absolute 2.5 (*)    All other components within normal limits  GROUP A STREP BY PCR  RESP PANEL BY RT-PCR (FLU A&B, COVID) ARPGX2  CULTURE, BLOOD (ROUTINE X 2)  CULTURE, BLOOD (ROUTINE X 2)  LACTIC ACID, PLASMA  URINALYSIS, ROUTINE W REFLEX MICROSCOPIC  PATHOLOGIST SMEAR REVIEW    EKG None  Radiology DG Chest 2 View  Result Date: 06/16/2021 CLINICAL DATA:  Flu like illness for 5 days, lethargy EXAM: CHEST - 2 VIEW COMPARISON:  03/16/2021 FINDINGS: Frontal and lateral views of the chest demonstrate chronic bilateral pleural effusions, right greater than left, decreased in volume since prior study. No acute airspace disease or pneumothorax. No acute bony abnormalities. IMPRESSION: 1. Small bilateral pleural effusions, decreased since prior study. 2. No acute airspace disease. Electronically Signed   By: Randa Ngo M.D.   On: 06/16/2021 19:48    Procedures .Critical Care Performed by: Eustaquio Maize, PA-C Authorized by: Eustaquio Maize, PA-C   Critical care provider statement:    Critical care time (minutes):  40   Critical care was time spent personally by me on the following activities:  Development of treatment plan with patient or surrogate, discussions with consultants, evaluation of patient's response to treatment, examination of patient, ordering and review of laboratory studies, ordering and review of radiographic studies, ordering and performing treatments and interventions, pulse oximetry, re-evaluation of patient's condition and review of old charts    Medications Ordered in ED Medications  metroNIDAZOLE (FLAGYL) IVPB 500 mg (500 mg Intravenous New Bag/Given 06/16/21 2139)  vancomycin (VANCOCIN) IVPB 1000 mg/200 mL premix (has no administration in time range)  0.9 %  sodium chloride infusion (has no  administration in time range)  vancomycin (VANCOREADY) IVPB 1750 mg/350 mL (has no administration in time range)  ceFEPIme (MAXIPIME) 2 g in sodium chloride 0.9 % 100 mL IVPB (has no administration in time range)  0.9 %  sodium chloride infusion (has no administration in time range)  norepinephrine (LEVOPHED) 4mg  in 275mL (0.016 mg/mL) premix infusion (has no administration in time range)  acetaminophen (TYLENOL) tablet 650 mg (650 mg Oral Given 06/16/21 1929)  sodium chloride 0.9 % bolus 1,000 mL (0 mLs Intravenous Stopped 06/16/21 2055)  ceFEPIme (MAXIPIME) 2 g in sodium chloride 0.9 % 100 mL IVPB (0 g Intravenous Stopped 06/16/21 2143)  sodium chloride 0.9 % bolus 1,000 mL (0 mLs Intravenous Stopped 06/16/21 2152)    ED Course/ Medical Decision Making/  A&P Clinical Course as of 06/16/21 2212  Thu Jun 16, 2021  2040 Neutrophils: 3 [AG]  2041 NEUT#(!!): 0.1 [AG]    Clinical Course User Index [AG] Lianne Cure, DO                           Medical Decision Making 76 year old female with a history of CLL who presents to the ED today with complaint of flulike symptoms for the past 5 days.  Has had increasing fatigue, decreased appetite, generalized weakness for the past 2 days.  On arrival to the ED patient's temperature is 100.0.  Has not taken any fever reducing medications today.  Her heart rate is 99.  Remainder vitals are unremarkable.  She does appear slightly ill-appearing at this time.  He is noted to have posterior oropharyngeal erythema and 1+ tonsillar hypertrophy on the left side however uvula is midline.  She is phonating normally and tolerating her own secretions without difficulty.  Abdomen soft and nontender.  Lungs clear to auscultation bilaterally.  We will plan to swab for COVID, flu, strep testing at this time.  Given history of CLL will expand lab work at this time including blood cultures and lactic acid.  Will provide 1 L fluid bolus as patient has dry mucous membranes on  exam. Tylenol provided for fever of 100.  Problems Addressed: Hyponatremia: acute illness or injury    Details: Sodium 125. Secondary to dehydration? Neutropenic fever (Why): acute illness or injury    Details: ANC count of 93. Severe neutropenia. Covered empirically with flagyl, cefepime, and vanc.  Amount and/or Complexity of Data Reviewed Labs: ordered. Decision-making details documented in ED Course.    Details: BMP with hyponatremia 125. Creatinine slightly elevated at 1.15. BUN 19. Chloride 90. Recieving fluids at this time. Pt will require admission for hyponatremia.   CBC with neutropenia with ANC count of 93. WBC 3.1, neutrophil 3%, absolute neutrophil 0.1. Started on empiric abx at this time. Blood cultures collected.   Strep negative COVID and flu negative Radiology: ordered. Discussion of management or test interpretation with external provider(s): CXR with small bilateral pleural effusions, decreased from previous study. No signs of pneumonia.   Risk OTC drugs. Prescription drug management. Decision regarding hospitalization. Risk Details: Discussed case with [facility] Dr. Alcario Drought who agrees to accept patient for admission. Plan for [facility] bed.   Critical Care Total time providing critical care: 30-74 minutes  10:12 PM Pt's blood pressure persistently in the 08'X systolic. Was not made aware by nursing staff. No improvement after total of 2,000 CC fluid bolus and maintenance fluids. Blood pressure checked in both arms; persistently hypotensive. Mentating appropriately. Will start on levo at this time. Attending physician Dr. Pearline Cables made aware; will place central line. Will consult PCCM at this time.   Discussed case with Dr. Tamala Julian PCCM who accepts patient to ICU.      Final Clinical Impression(s) / ED Diagnoses Final diagnoses:  Neutropenic fever ([place])  Hyponatremia    Rx / DC Orders ED Discharge Orders     None         Eustaquio Maize,  PA-C 06/16/21 2115    Eustaquio Maize, PA-C 44/81/85 [id number]    Lianne Cure, DO 97/02/63 2101

## 2021-06-16 NOTE — H&P (Addendum)
NAME:  Nicole Campbell, MRN:  [id number], DOB:  [birth year], LOS: 0 ADMISSION DATE:  06/16/2021, CONSULTATION DATE:  06/16/21 REFERRING MD:  Pearline Cables CHIEF COMPLAINT:  Weakness   History of Present Illness:  Nicole Campbell is a 76 y.o. 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 Alen Blew).  She presented to [facility] ED 2/16 with flu like symptoms x 5 days including sore throat, chills, congestion, myalgias, fevers, decreased appetite. She had called PCP who prescribed Amoxicillin and Doxycycline for possible sinus infection; however, had no relief. Per husband, she began to get more weak then usual 2 days prior to ED presentation and also had increased fatigue.  In ED, she was given 2L IVF but had ongoing hypotension. COVID, flu, strep were negative. Labs were noteable for WBC of 3.1 with ANC ~ 100. Blood cultures were sent and she was started empirically on Vancomycin, Flagyl, Cefepime. Due to ongoing hypotension, PCCM called for transfer to [facility] for further management.  Prior to transfer, BP improved and pt was never started on vasopressors.  Pertinent  Medical History:  has CLL (chronic lymphocytic leukemia) ([place]); DOE (dyspnea on exertion); Pleural effusion, bilateral; Hypothyroidism, acquired; Goals of care, counseling/discussion; Chronic cough; Palpitations; Febrile neutropenia ([place]); and Neutropenic fever ([place]) on their problem list.  Significant Hospital Events: Including procedures, antibiotic start and stop dates in addition to other pertinent events   2/16 admit.  Interim History / Subjective:  Feels a bit better but not normal. SBP 116.  Levophed never started.  Objective:  Blood pressure (!) 112/56, pulse 82, temperature 97.9 F (36.6 C), temperature source Oral, resp. rate (!) 22, height 5\' 6"  (1.676 m), weight 71.9 kg, SpO2 99 %.        Intake/Output Summary (Last 24 hours) at  06/17/2021 0121 Last data filed at 06/16/2021 2319 Gross per 24 hour  Intake 1200 ml  Output --  Net 1200 ml   Filed Weights   06/16/21 1850 06/17/21 0055  Weight: 65.8 kg 71.9 kg    Examination: General: Adult female, resting in bed, in NAD. Neuro: A&O x 3, no deficits. HEENT: [place]/AT. Sclerae anicteric. EOMI. Cardiovascular: RRR, no M/R/G.  Lungs: Respirations even and unlabored.  CTA bilaterally, No W/R/R. Abdomen: BS x 4, soft, NT/ND.  Musculoskeletal: No gross deformities, no edema.  Skin: Intact, warm, no rashes.   Labs/imaging personally reviewed:  CXR 2/16 > small B effusions.  Assessment & Plan:   Neutropenic fever in context of underlying CLL - unclear etiology at this point.  Lactic acid reassuring. - Continue empiric abx with Vanc, Cefepime. - Follow cultures. - Continue fluids. - Hold home Imbruvica.  Hypotension - improved with fluids. - Continue supportive care. - Hold home antihypertensives.  Small bilateral effusions - small on CXR, not sure enough present to warrant thora currently. - Follow imaging and consider thora if worse after fluids or if no improvement.  Hyponatremia with AKI - suspect hypovolemia. - Continue fluids. - Follow BMP.  Hx HTN, VMP. - Hold home Toprol-XL, Aldactazide.  Hx Hypothyroidism. - Continue home Synthroid. - Assess TSH.  Hx RA (not on any meds). - No interventions required.   BP improved with fluids. Will ask TRH to assume care in AM starting at 0700.  Confirmed with flow manager and Dr. [name].  Please call back if we can be of further assistance.  She will need  femoral CVL removed.   Best practice (evaluated daily):  Diet/type: Regular consistency (see orders) DVT prophylaxis: prophylactic heparin  GI prophylaxis: N/A Lines: Central line Foley:  N/A Code Status:  full code Last date of multidisciplinary goals of care discussion: None yet.  Labs   CBC: Recent Labs  Lab 06/16/21 1928  WBC 3.1*   NEUTROABS 0.1*  HGB 11.7*  HCT 35.2*  MCV 85.9  PLT 517    Basic Metabolic Panel: Recent Labs  Lab 06/16/21 1928  NA 125*  K 3.6  CL 90*  CO2 24  GLUCOSE 115*  BUN 19  CREATININE 1.15*  CALCIUM 8.1*   GFR: Estimated Creatinine Clearance: 43.6 mL/min (A) (by C-G formula based on SCr of 1.15 mg/dL (H)). Recent Labs  Lab 06/16/21 1928  WBC 3.1*  LATICACIDVEN 1.7    Liver Function Tests: No results for input(s): AST, ALT, ALKPHOS, BILITOT, PROT, ALBUMIN in the last 168 hours. No results for input(s): LIPASE, AMYLASE in the last 168 hours. No results for input(s): AMMONIA in the last 168 hours.  ABG No results found for: PHART, PCO2ART, PO2ART, HCO3, TCO2, ACIDBASEDEF, O2SAT   Coagulation Profile: No results for input(s): INR, PROTIME in the last 168 hours.  Cardiac Enzymes: No results for input(s): CKTOTAL, CKMB, CKMBINDEX, TROPONINI in the last 168 hours.  HbA1C: No results found for: HGBA1C  CBG: Recent Labs  Lab 06/17/21 0047  GLUCAP 133*    Review of Systems:   All negative; except for those that are bolded, which indicate positives.  Constitutional: weight loss, weight gain, night sweats, fevers, chills, fatigue, weakness.  HEENT: headaches, sore throat, sneezing, nasal congestion, post nasal drip, difficulty swallowing, tooth/dental problems, visual complaints, visual changes, ear aches. Neuro: difficulty with speech, weakness, numbness, ataxia. CV:  chest pain, orthopnea, PND, swelling in lower extremities, dizziness, palpitations, syncope.  Resp: cough, hemoptysis, dyspnea, wheezing. GI: heartburn, indigestion, abdominal pain, nausea, vomiting, diarrhea, constipation, change in bowel habits, loss of appetite, hematemesis, melena, hematochezia.  GU: dysuria, change in color of urine, urgency or frequency, flank pain, hematuria. MSK: joint pain or swelling, decreased range of motion. Psych: change in mood or affect, depression, anxiety, suicidal  ideations, homicidal ideations. Skin: rash, itching, bruising.   Past Medical History:  She,  has a past medical history of CLL (chronic lymphocytic leukemia) ([place]), Hypertension, Hypothyroid, MVP (mitral valve prolapse), and Rheumatoid arthritis ([place]).   Surgical History:   Past Surgical History:  Procedure Laterality Date   ABDOMINAL HYSTERECTOMY     IR THORACENTESIS ASP PLEURAL SPACE W/IMG GUIDE  11/21/2019   IR THORACENTESIS ASP PLEURAL SPACE W/IMG GUIDE  07/05/2020   TONSILLECTOMY       Social History:   reports that she has never smoked. She has never used smokeless tobacco. She reports current alcohol use. She reports that she does not use drugs.   Family History:  Her family history includes Congestive Heart Failure in her father.   Allergies Allergies  Allergen Reactions   Methotrexate Other (See Comments)    Other reaction(s): horrible lower back pain   Erythromycin Palpitations     Home Medications  Prior to Admission medications   Medication Sig Start Date End Date Taking? Authorizing Provider  escitalopram (LEXAPRO) 5 MG tablet Take 5 mg by mouth daily. 12/15/20   [provider]  ibrutinib (IMBRUVICA) 420 MG TABS TAKE 1 TABLET (420 MG) BY MOUTH DAILY. 05/24/21 05/24/22  Wyatt Portela, MD  ibrutinib 420 MG TABS Take 1 tablet  by mouth daily.    [name], MD  levothyroxine (SYNTHROID, LEVOTHROID) 25 MCG tablet Take 25 mcg by mouth daily before breakfast.  04/30/17   [provider]  metoprolol succinate (TOPROL XL) 25 MG 24 hr tablet Take 1 tablet (25 mg total) by mouth daily. 10/08/20   Lelon Perla, MD  spironolactone-hydrochlorothiazide (ALDACTAZIDE) 25-25 MG tablet Take 1 tablet by mouth daily. 09/30/20   [provider]     Critical care time: N/A.    Montey Hora, [place] [facility] For pager details, please see AMION or use Epic chat  After 1900, please call [place] for cross coverage  needs 06/17/2021, 1:21 AM

## 2021-06-16 NOTE — Progress Notes (Signed)
Pharmacy Antibiotic Note  Nicole Campbell is a 76 y.o. female admitted on 06/16/2021 with  infection of unknown source .  Pharmacy has been consulted for Cefepime and vancomycin dosing.  WBC low, SCr mildly elevated. CrCl ~ 40 mL/min   Plan: -Cefepime 2 gm IV Q 12 hours -Vancomycin 1750 mg IV Q 48 hrs. Goal AUC 400-550. Expected AUC: 495 SCr used: 1.15 -Monitor CBC, renal fx, cultures and clinical progress -Vanc levels as indicated    Height: 5\' 6"  (167.6 cm) Weight: 65.8 kg (145 lb) IBW/kg (Calculated) : 59.3  Temp (24hrs), Avg:100 F (37.8 C), Min:100 F (37.8 C), Max:100 F (37.8 C)  Recent Labs  Lab 06/16/21 1928  WBC 3.1*  CREATININE 1.15*  LATICACIDVEN 1.7    Estimated Creatinine Clearance: 40.2 mL/min (A) (by C-G formula based on SCr of 1.15 mg/dL (H)).    Allergies  Allergen Reactions   Methotrexate Other (See Comments)    Other reaction(s): horrible lower back pain   Erythromycin Palpitations    Antimicrobials this admission: Cefepime 2/16 >>  Vancomycin 2/16 >>   Dose adjustments this admission:   Microbiology results: 2/16 BCx:   Thank you for allowing pharmacy to be a part of this patients care.  Albertina Parr, PharmD., BCCCP Clinical Pharmacist Please refer to [facility] for unit-specific pharmacist

## 2021-06-16 NOTE — ED Triage Notes (Signed)
Pt arrives with husband who reports patient has had a flu like illness for 5 days, called into her doctor and was given 2 antibiotics. Pt husband reports she has been lethargic, moving slower than normal and getting worse every day. Husband states she is not really eating, will not get out of her chair, and is sleeping all day.

## 2021-06-16 NOTE — ED Notes (Signed)
Requested urine sample from patient.  She was instructed to call when she thinks she can give a sample.

## 2021-06-16 NOTE — ED Notes (Signed)
Patient transported to CT 

## 2021-06-17 ENCOUNTER — Inpatient Hospital Stay (HOSPITAL_COMMUNITY): Payer: PPO

## 2021-06-17 DIAGNOSIS — R5081 Fever presenting with conditions classified elsewhere: Secondary | ICD-10-CM | POA: Diagnosis not present

## 2021-06-17 DIAGNOSIS — E876 Hypokalemia: Secondary | ICD-10-CM | POA: Diagnosis present

## 2021-06-17 DIAGNOSIS — J329 Chronic sinusitis, unspecified: Secondary | ICD-10-CM | POA: Diagnosis present

## 2021-06-17 DIAGNOSIS — I9589 Other hypotension: Secondary | ICD-10-CM | POA: Diagnosis present

## 2021-06-17 DIAGNOSIS — E039 Hypothyroidism, unspecified: Secondary | ICD-10-CM | POA: Diagnosis present

## 2021-06-17 DIAGNOSIS — I509 Heart failure, unspecified: Secondary | ICD-10-CM

## 2021-06-17 DIAGNOSIS — Z8249 Family history of ischemic heart disease and other diseases of the circulatory system: Secondary | ICD-10-CM | POA: Diagnosis not present

## 2021-06-17 DIAGNOSIS — Z90710 Acquired absence of both cervix and uterus: Secondary | ICD-10-CM | POA: Diagnosis not present

## 2021-06-17 DIAGNOSIS — D709 Neutropenia, unspecified: Secondary | ICD-10-CM | POA: Diagnosis not present

## 2021-06-17 DIAGNOSIS — N179 Acute kidney failure, unspecified: Secondary | ICD-10-CM | POA: Diagnosis present

## 2021-06-17 DIAGNOSIS — C9110 Chronic lymphocytic leukemia of B-cell type not having achieved remission: Secondary | ICD-10-CM | POA: Diagnosis present

## 2021-06-17 DIAGNOSIS — J0100 Acute maxillary sinusitis, unspecified: Secondary | ICD-10-CM | POA: Diagnosis not present

## 2021-06-17 DIAGNOSIS — I5032 Chronic diastolic (congestive) heart failure: Secondary | ICD-10-CM | POA: Diagnosis present

## 2021-06-17 DIAGNOSIS — Z881 Allergy status to other antibiotic agents status: Secondary | ICD-10-CM | POA: Diagnosis not present

## 2021-06-17 DIAGNOSIS — Z79890 Hormone replacement therapy (postmenopausal): Secondary | ICD-10-CM | POA: Diagnosis not present

## 2021-06-17 DIAGNOSIS — I081 Rheumatic disorders of both mitral and tricuspid valves: Secondary | ICD-10-CM | POA: Diagnosis present

## 2021-06-17 DIAGNOSIS — Z888 Allergy status to other drugs, medicaments and biological substances status: Secondary | ICD-10-CM | POA: Diagnosis not present

## 2021-06-17 DIAGNOSIS — R6521 Severe sepsis with septic shock: Secondary | ICD-10-CM

## 2021-06-17 DIAGNOSIS — E861 Hypovolemia: Secondary | ICD-10-CM | POA: Diagnosis present

## 2021-06-17 DIAGNOSIS — L0201 Cutaneous abscess of face: Secondary | ICD-10-CM | POA: Diagnosis not present

## 2021-06-17 DIAGNOSIS — I110 Hypertensive heart disease with heart failure: Secondary | ICD-10-CM | POA: Diagnosis present

## 2021-06-17 DIAGNOSIS — Z79899 Other long term (current) drug therapy: Secondary | ICD-10-CM | POA: Diagnosis not present

## 2021-06-17 DIAGNOSIS — E871 Hypo-osmolality and hyponatremia: Secondary | ICD-10-CM | POA: Diagnosis present

## 2021-06-17 DIAGNOSIS — M069 Rheumatoid arthritis, unspecified: Secondary | ICD-10-CM | POA: Diagnosis present

## 2021-06-17 DIAGNOSIS — A419 Sepsis, unspecified organism: Secondary | ICD-10-CM | POA: Diagnosis present

## 2021-06-17 DIAGNOSIS — Z20822 Contact with and (suspected) exposure to covid-19: Secondary | ICD-10-CM | POA: Diagnosis present

## 2021-06-17 LAB — BASIC METABOLIC PANEL
Anion gap: 8 (ref 5–15)
Anion gap: 8 (ref 5–15)
BUN: 13 mg/dL (ref 8–23)
BUN: 14 mg/dL (ref 8–23)
CO2: 22 mmol/L (ref 22–32)
CO2: 21 mmol/L — ABNORMAL LOW (ref 22–32)
Calcium: 7.4 mg/dL — ABNORMAL LOW (ref 8.9–10.3)
Calcium: 8 mg/dL — ABNORMAL LOW (ref 8.9–10.3)
Chloride: 101 mmol/L (ref 98–111)
Chloride: 104 mmol/L (ref 98–111)
Creatinine, Ser: 0.93 mg/dL (ref 0.44–1.00)
Creatinine, Ser: 0.85 mg/dL (ref 0.44–1.00)
GFR, Estimated: >60 mL/min (ref >60)
GFR, Estimated: >60 mL/min (ref >60)
Glucose, Bld: 111 mg/dL — ABNORMAL HIGH (ref 70–99)
Glucose, Bld: 170 mg/dL — ABNORMAL HIGH (ref 70–99)
Potassium: 3.3 mmol/L — ABNORMAL LOW (ref 3.5–5.1)
Potassium: 3.8 mmol/L (ref 3.5–5.1)
Sodium: 131 mmol/L — ABNORMAL LOW (ref 135–145)
Sodium: 133 mmol/L — ABNORMAL LOW (ref 135–145)

## 2021-06-17 LAB — BLOOD CULTURE ID PANEL (REFLEXED) - BCID2

## 2021-06-17 LAB — URINALYSIS, ROUTINE W REFLEX MICROSCOPIC
Bilirubin Urine: NEGATIVE
Glucose, UA: NEGATIVE mg/dL
Ketones, ur: NEGATIVE mg/dL
Leukocytes,Ua: NEGATIVE
Nitrite: NEGATIVE
Protein, ur: NEGATIVE mg/dL
Specific Gravity, Urine: 1.005 (ref 1.005–1.030)
pH: 6 (ref 5.0–8.0)

## 2021-06-17 LAB — CBC
HCT: 30.4 % — ABNORMAL LOW (ref 36.0–46.0)
Hemoglobin: 10 g/dL — ABNORMAL LOW (ref 12.0–15.0)
MCH: 28.7 pg (ref 26.0–34.0)
MCHC: 32.9 g/dL (ref 30.0–36.0)
MCV: 87.4 fL (ref 80.0–100.0)
Platelets: 164 10*3/uL (ref 150–400)
RBC: 3.48 MIL/uL — ABNORMAL LOW (ref 3.87–5.11)
RDW: 13.7 % (ref 11.5–15.5)
WBC: 3 10*3/uL — ABNORMAL LOW (ref 4.0–10.5)
nRBC: 0 % (ref 0.0–0.2)

## 2021-06-17 LAB — CBC WITH DIFFERENTIAL/PLATELET
Abs Immature Granulocytes: 0.02 10*3/uL (ref 0.00–0.07)
Basophils Absolute: 0 10*3/uL (ref 0.0–0.1)
Basophils Relative: 1 %
Eosinophils Absolute: 0 10*3/uL (ref 0.0–0.5)
Eosinophils Relative: 0 %
HCT: 31.1 % — ABNORMAL LOW (ref 36.0–46.0)
Hemoglobin: 9.9 g/dL — ABNORMAL LOW (ref 12.0–15.0)
Immature Granulocytes: 1 %
Lymphocytes Relative: 15 %
Lymphs Abs: 0.4 10*3/uL — ABNORMAL LOW (ref 0.7–4.0)
MCH: 28.2 pg (ref 26.0–34.0)
MCHC: 31.8 g/dL (ref 30.0–36.0)
MCV: 88.6 fL (ref 80.0–100.0)
Monocytes Absolute: 2.2 10*3/uL — ABNORMAL HIGH (ref 0.1–1.0)
Monocytes Relative: 80 %
Neutro Abs: 0.1 10*3/uL — CL (ref 1.7–7.7)
Neutrophils Relative %: 3 %
Platelets: 186 10*3/uL (ref 150–400)
RBC: 3.51 MIL/uL — ABNORMAL LOW (ref 3.87–5.11)
RDW: 13.8 % (ref 11.5–15.5)
WBC: 2.8 10*3/uL — ABNORMAL LOW (ref 4.0–10.5)
nRBC: 0 % (ref 0.0–0.2)

## 2021-06-17 LAB — MRSA NEXT GEN BY PCR, NASAL: MRSA by PCR Next Gen: NOT DETECTED

## 2021-06-17 LAB — RESPIRATORY PANEL BY PCR

## 2021-06-17 LAB — HEPATIC FUNCTION PANEL
ALT: 30 U/L (ref 0–44)
AST: 20 U/L (ref 15–41)
Albumin: 2.4 g/dL — ABNORMAL LOW (ref 3.5–5.0)
Alkaline Phosphatase: 73 U/L (ref 38–126)
Bilirubin, Direct: 0.3 mg/dL — ABNORMAL HIGH (ref 0.0–0.2)
Indirect Bilirubin: 0.7 mg/dL (ref 0.3–0.9)
Total Bilirubin: 1 mg/dL (ref 0.3–1.2)
Total Protein: 4.7 g/dL — ABNORMAL LOW (ref 6.5–8.1)

## 2021-06-17 LAB — ECHOCARDIOGRAM LIMITED
AR max vel: 2.14 cm2
AV Area VTI: 2.28 cm2
AV Area mean vel: 2.23 cm2
AV Mean grad: 7 mmHg
AV Peak grad: 12 mmHg
Ao pk vel: 1.74 m/s
Area-P 1/2: 3.85 cm2
Calc EF: 63.2 %
Height: 66 in
MV M vel: 3.53 m/s
MV Peak grad: 49.7 mmHg
MV VTI: 3.13 cm2
S' Lateral: 2.6 cm
Single Plane A2C EF: 62.3 %
Single Plane A4C EF: 63.4 %
Weight: 2536.17 oz

## 2021-06-17 LAB — PATHOLOGIST SMEAR REVIEW

## 2021-06-17 LAB — PROCALCITONIN: Procalcitonin: 0.65 ng/mL

## 2021-06-17 LAB — GLUCOSE, CAPILLARY
Glucose-Capillary: 114 mg/dL — ABNORMAL HIGH (ref 70–99)
Glucose-Capillary: 120 mg/dL — ABNORMAL HIGH (ref 70–99)
Glucose-Capillary: 133 mg/dL — ABNORMAL HIGH (ref 70–99)
Glucose-Capillary: 161 mg/dL — ABNORMAL HIGH (ref 70–99)
Glucose-Capillary: 92 mg/dL (ref 70–99)

## 2021-06-17 LAB — CORTISOL: Cortisol, Plasma: 23.1 ug/dL

## 2021-06-17 LAB — PHOSPHORUS: Phosphorus: 2.8 mg/dL (ref 2.5–4.6)

## 2021-06-17 LAB — LACTIC ACID, PLASMA
Lactic Acid, Venous: 2 mmol/L (ref 0.5–1.9)
Lactic Acid, Venous: 1.9 mmol/L (ref 0.5–1.9)

## 2021-06-17 LAB — SODIUM, URINE, RANDOM: Sodium, Ur: 19 mmol/L

## 2021-06-17 LAB — OSMOLALITY, URINE: Osmolality, Ur: 208 mOsm/kg — ABNORMAL LOW (ref 300–900)

## 2021-06-17 LAB — MAGNESIUM: Magnesium: 2 mg/dL (ref 1.7–2.4)

## 2021-06-17 LAB — TSH: TSH: 2.281 u[IU]/mL (ref 0.350–4.500)

## 2021-06-17 MED ORDER — NOREPINEPHRINE 4 MG/250ML-% IV SOLN
0.0000 ug/min | INTRAVENOUS | Status: DC
  Administered 2021-06-17: 2 ug/min via INTRAVENOUS

## 2021-06-17 MED ORDER — POTASSIUM CHLORIDE 10 MEQ/50ML IV SOLN: 10.0000 meq | INTRAVENOUS | Status: DC

## 2021-06-17 MED ORDER — CHLORHEXIDINE GLUCONATE CLOTH 2 % EX PADS
6.0000 | MEDICATED_PAD | Freq: Every day | CUTANEOUS | Status: DC
  Administered 2021-06-17 – 2021-06-19 (×4): 6 via TOPICAL

## 2021-06-17 MED ORDER — ENSURE ENLIVE PO LIQD
237.0000 mL | Freq: Two times a day (BID) | ORAL | Status: DC
  Administered 2021-06-17 – 2021-06-19 (×5): 237 mL via ORAL

## 2021-06-17 MED ORDER — POLYETHYLENE GLYCOL 3350 17 G PO PACK: 17.0000 g | PACK | Freq: Every day | ORAL | Status: DC | PRN

## 2021-06-17 MED ORDER — POTASSIUM CHLORIDE 10 MEQ/50ML IV SOLN
10.0000 meq | INTRAVENOUS | Status: DC
  Administered 2021-06-17: 10 meq via INTRAVENOUS
  Filled 2021-06-17: qty 50

## 2021-06-17 MED ORDER — SODIUM CHLORIDE 0.9% FLUSH
10.0000 mL | Freq: Two times a day (BID) | INTRAVENOUS | Status: DC
  Administered 2021-06-17 – 2021-06-19 (×5): 10 mL

## 2021-06-17 MED ORDER — POTASSIUM CHLORIDE CRYS ER 20 MEQ PO TBCR
20.0000 meq | EXTENDED_RELEASE_TABLET | ORAL | Status: DC
  Filled 2021-06-17: qty 1

## 2021-06-17 MED ORDER — HEPARIN SODIUM (PORCINE) 5000 UNIT/ML IJ SOLN
5000.0000 [IU] | Freq: Three times a day (TID) | INTRAMUSCULAR | Status: DC
  Administered 2021-06-17 – 2021-06-20 (×10): 5000 [IU] via SUBCUTANEOUS
  Filled 2021-06-17 (×10): qty 1

## 2021-06-17 MED ORDER — ADULT MULTIVITAMIN W/MINERALS CH
1.0000 | ORAL_TABLET | Freq: Every day | ORAL | Status: DC
  Administered 2021-06-18 – 2021-06-19 (×2): 1 via ORAL
  Filled 2021-06-17 (×2): qty 1

## 2021-06-17 MED ORDER — NOREPINEPHRINE 4 MG/250ML-% IV SOLN
INTRAVENOUS | Status: AC
Start: 2021-06-17 — End: 2021-06-17
  Filled 2021-06-17: qty 250

## 2021-06-17 MED ORDER — SODIUM CHLORIDE 0.9% FLUSH: 10.0000 mL | INTRAVENOUS | Status: DC | PRN

## 2021-06-17 MED ORDER — ACETAMINOPHEN 325 MG PO TABS
650.0000 mg | ORAL_TABLET | Freq: Once | ORAL | Status: AC
  Administered 2021-06-17: 650 mg via ORAL
  Filled 2021-06-17: qty 2

## 2021-06-17 MED ORDER — ALBUMIN HUMAN 25 % IV SOLN
25.0000 g | Freq: Four times a day (QID) | INTRAVENOUS | Status: AC
  Administered 2021-06-17 – 2021-06-18 (×3): 25 g via INTRAVENOUS
  Filled 2021-06-17 (×3): qty 100

## 2021-06-17 MED ORDER — ESCITALOPRAM OXALATE 10 MG PO TABS
5.0000 mg | ORAL_TABLET | Freq: Every day | ORAL | Status: DC
Start: 2021-06-18 — End: 2021-06-20
  Administered 2021-06-18 – 2021-06-19 (×2): 5 mg via ORAL
  Filled 2021-06-17 (×2): qty 1

## 2021-06-17 MED ORDER — POTASSIUM CHLORIDE 10 MEQ/50ML IV SOLN
10.0000 meq | INTRAVENOUS | Status: AC
  Administered 2021-06-17 (×5): 10 meq via INTRAVENOUS
  Filled 2021-06-17 (×2): qty 50

## 2021-06-17 MED ORDER — SODIUM CHLORIDE 0.9 % IV BOLUS
1000.0000 mL | Freq: Once | INTRAVENOUS | Status: AC
  Administered 2021-06-17: 1000 mL via INTRAVENOUS

## 2021-06-17 MED ORDER — DOCUSATE SODIUM 100 MG PO CAPS: 100.0000 mg | ORAL_CAPSULE | Freq: Two times a day (BID) | ORAL | Status: DC | PRN

## 2021-06-17 MED ORDER — LEVOTHYROXINE SODIUM 25 MCG PO TABS
25.0000 ug | ORAL_TABLET | Freq: Every day | ORAL | Status: DC
  Administered 2021-06-17 – 2021-06-20 (×4): 25 ug via ORAL
  Filled 2021-06-17 (×4): qty 1

## 2021-06-17 NOTE — Plan of Care (Signed)

## 2021-06-17 NOTE — Progress Notes (Signed)
K+3.3 ?Replaced per protocol  ?

## 2021-06-17 NOTE — Progress Notes (Signed)
NAME:  Nicole Campbell, MRN:  [id number], DOB:  May 17, 1945, LOS: 0 ADMISSION DATE:  06/16/2021, CONSULTATION DATE:  06/16/21 REFERRING MD:  Pearline Cables CHIEF COMPLAINT:  Weakness   History of Present Illness:  Nicole Campbell is a 76 y.o. 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 Alen Blew).  She presented to [facility] ED 2/16 with flu like symptoms x 5 days including sore throat, chills, congestion, myalgias, fevers, decreased appetite. She had called PCP who prescribed Amoxicillin and Doxycycline for possible sinus infection; however, had no relief. Per husband, she began to get more weak then usual 2 days prior to ED presentation and also had increased fatigue.  In ED, she was given 2L IVF but had ongoing hypotension. COVID, flu, strep were negative. Labs were noteable for WBC of 3.1 with ANC ~ 100. Blood cultures were sent and she was started empirically on Vancomycin, Flagyl, Cefepime. Due to ongoing hypotension, PCCM called for transfer to [facility] for further management.  Prior to transfer, BP improved and pt was never started on vasopressors.  Pertinent  Medical History:  has CLL (chronic lymphocytic leukemia) ([place]); DOE (dyspnea on exertion); Pleural effusion, bilateral; Hypothyroidism, acquired; Goals of care, counseling/discussion; Chronic cough; Palpitations; Febrile neutropenia ([place]); and Neutropenic fever ([place]) on their problem list.  Significant Hospital Events: Including procedures, antibiotic start and stop dates in addition to other pertinent events   2/16 admit 2/17 pressors started  Interim History / Subjective:  Required vasopressors overnight but able to weaned off this morning after additional fluids. Has not been eating well at home. Has chronic LE edema for which she takes diuretics at home.   Objective:  Blood pressure (!) 90/42, pulse 88, temperature 99 F (37.2 C), temperature  source Oral, resp. rate 18, height 5\' 6"  (1.676 m), weight 71.9 kg, SpO2 97 %.        Intake/Output Summary (Last 24 hours) at 06/17/2021 0840 Last data filed at 06/17/2021 0400 Gross per 24 hour  Intake 2818.79 ml  Output 425 ml  Net 2393.79 ml    Filed Weights   06/16/21 1850 06/17/21 0055 06/17/21 0147  Weight: 65.8 kg 71.9 kg 71.9 kg    Examination: General: middle aged woman sitting up in bed in NAD Neuro: awake and alert, moving all extremities HEENT: [place]/AT, eyes anicteric. Facial plethora. No facial edema. Cardiovascular: S1S2, RRR, no murmurs Lungs: breathing comfortably on [place], CTAB Abdomen:  soft, NT, ND Musculoskeletal: +LE edema, no cyanosis Skin: mild erythema bilateral distal LE-- mostly medially on left, anteriorly and medially on the R  Na+  131 K+ 3.3 WBC 3.0 H/H 10/30.4 Blood cx> NGTD RVP negative LA 2> 1.9  Labs/imaging personally reviewed:  CXR 2/16 > small B effusions.  Assessment & Plan:   Neutropenic fever with underlying CLL - unclear etiology at this point.  Lactic acid reassuring. Not likely UTI. Effusion is chronic and smaller than in the past. CT confirms sinusitis. - Continue empiric abx with Vanc, Cefepime. May need ENT consult if not improving with empiric IV antibiotics.  - Follow cultures. - Continue fluids. Place TED hose given chronic LE edema.  - Hold home Imbruvica.   Sepsis; shock resolved. Normal cortisol (not drawn at 8AM). -con't antibiotics -off NE since this morning  Small bilateral effusions - small on CXR, not sure enough present to warrant thora currently. -follow CXR, if enlarging can consider  thora.  Hyponatremia with AKI - suspect hypovolemia, improving with volume. Had poor PO intake PTA. -follow up BMP tomorrow -hold HCTZ  Hypokalemia-repleted, monitor  Hx HTN, mitral valve prolapse. - Hold home Toprol-XL, spironolactone- HCTZ  Hx Hypothyroidism; TSH WNL this admission -con't synthroid  Hx RA (not on any  meds). - No interventions required.     Best practice (evaluated daily):  Diet/type: Regular consistency (see orders) DVT prophylaxis: prophylactic heparin  GI prophylaxis: N/A Lines: Central line Foley:  N/A Code Status:  full code Last date of multidisciplinary goals of care discussion: None yet.  Labs   CBC: Recent Labs  Lab 06/16/21 1928 06/17/21 0423  WBC 3.1* 3.0*  NEUTROABS 0.1*  --   HGB 11.7* 10.0*  HCT 35.2* 30.4*  MCV 85.9 87.4  PLT 212 164     Basic Metabolic Panel: Recent Labs  Lab 06/16/21 1928 06/17/21 0423  NA 125* 131*  K 3.6 3.3*  CL 90* 101  CO2 24 22  GLUCOSE 115* 111*  BUN 19 13  CREATININE 1.15* 0.93  CALCIUM 8.1* 7.4*  MG  --  2.0  PHOS  --  2.8    GFR: Estimated Creatinine Clearance: 53.9 mL/min (by C-G formula based on SCr of 0.93 mg/dL). Recent Labs  Lab 06/16/21 1928 06/17/21 0423  WBC 3.1* 3.0*  LATICACIDVEN 1.7  --      This patient is critically ill with multiple organ system failure which requires frequent high complexity decision making, assessment, support, evaluation, and titration of therapies. This was completed through the application of advanced monitoring technologies and extensive interpretation of multiple databases. During this encounter critical care time was devoted to patient care services described in this note for 35 minutes.  Julian Hy, DO 06/17/21 5:15 PM [facility]

## 2021-06-17 NOTE — ED Notes (Signed)
[facility] to bedside to assume care of patient. ?

## 2021-06-17 NOTE — Progress Notes (Signed)
NAME:  Nikitta Sobiech, MRN:  [id number], DOB:  [birth year], LOS: 0 ADMISSION DATE:  06/16/2021, CONSULTATION DATE:  06/16/21 REFERRING MD:  Pearline Cables CHIEF COMPLAINT:  Weakness   History of Present Illness:  Nicole Campbell is a 76 y.o. 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 Alen Blew).  She presented to [facility] ED 2/16 with flu like symptoms x 5 days including sore throat, chills, congestion, myalgias, fevers, decreased appetite. She had called PCP who prescribed Amoxicillin and Doxycycline for possible sinus infection; however, had no relief. Per husband, she began to get more weak then usual 2 days prior to ED presentation and also had increased fatigue.  In ED, she was given 2L IVF but had ongoing hypotension. COVID, flu, strep were negative. Labs were noteable for WBC of 3.1 with ANC ~ 100. Blood cultures were sent and she was started empirically on Vancomycin, Flagyl, Cefepime. Due to ongoing hypotension, PCCM called for transfer to [facility] for further management.   Initially did not need vasopressors and transferred to medicine service, however throughout the night with progressive hypotension requiring vasopressor support.   Pertinent  Medical History:  has CLL (chronic lymphocytic leukemia) ([place]); DOE (dyspnea on exertion); Pleural effusion, bilateral; Hypothyroidism, acquired; Goals of care, counseling/discussion; Chronic cough; Palpitations; Febrile neutropenia ([place]); and Neutropenic fever ([place]) on their problem list.  Significant Hospital Events: Including procedures, antibiotic start and stop dates in addition to other pertinent events   2/16 admit.  Interim History / Subjective:  This AM placed on vasopressors for ongoing hypotension.   Objective:  Blood pressure (!) 90/42, pulse 88, temperature 99 F (37.2 C), temperature source Oral, resp. rate 18, height 5\' 6"  (1.676 m), weight 71.9  kg, SpO2 97 %.        Intake/Output Summary (Last 24 hours) at 06/17/2021 0950 Last data filed at 06/17/2021 0400 Gross per 24 hour  Intake 2818.79 ml  Output 425 ml  Net 2393.79 ml   Filed Weights   06/16/21 1850 06/17/21 0055 06/17/21 0147  Weight: 65.8 kg 71.9 kg 71.9 kg    Examination: General: Adult female, lying in bed, no distress  Neuro: alert, oriented, follows commands  HEENT: Dry MM, congested voice  Cardiovascular: RRR, no M/R/G.  Lungs: Respirations even and unlabored.  Clear breath sounds  Abdomen: soft, active bowel sounds, non-tender  Musculoskeletal: +3 BLE edema, right left with slight warmth and redness  Skin: Intact, warm, no rashes.   Labs/imaging personally reviewed:  CXR 2/16 > small B effusions.  Assessment & Plan:   Neutropenic fever in context of underlying CLL - unclear etiology at this point.   -Patient with reported sinus congestion ongoing since December  -MRSA PCR negative  -CXR with small bilateral pleural effusions >> decreased from prior  -U/A negative nitrates, negative leukocytes, rare bacteria  Plan - Continue empiric abx with Vanc, Cefepime. - CT Face/Sinus to R/O abscess  - Obtain PCT and LA  - RVP pending  - Cortisol pending  - Follow cultures. - Hold home [place].  Hypotension concern for hypovolemic state, unclear infectious etiology  -ECHO 08/2020 with EF 67%, G1DD Plan  - Give additional fluid as below  - Titrate Levophed for MAP goal >65 (Currently on 2 mcg/min)  - Hold home antihypertensives. - Obtain Repeat ECHO   Small bilateral effusions - small on CXR, not enough present to warrant thora  Hyponatremia with AKI - suspect hypovolemia. -Patient with poor oral intake last 3-4 days  Hypokalemia  Plan - Continue fluids. > Give additional bolus now  - Send Urine Studies  - K replacing now  - Follow BMP.  Hx HTN, VMP. - Hold home Toprol-XL, Aldactazide in setting of hypotension   Hx Hypothyroidism. TSH  2.281 - Continue home Synthroid.  Hx RA (not on any meds). - No interventions required.   Best practice (evaluated daily):  Diet/type: Regular consistency (see orders) DVT prophylaxis: prophylactic heparin  GI prophylaxis: N/A Lines: Central line >>> if remains only on low dose throughout the day will d/c  Foley:  N/A Code Status:  full code Last date of multidisciplinary goals of care discussion: pending   Labs   CBC: Recent Labs  Lab 06/16/21 1928 06/17/21 0423  WBC 3.1* 3.0*  NEUTROABS 0.1*  --   HGB 11.7* 10.0*  HCT 35.2* 30.4*  MCV 85.9 87.4  PLT 212 675    Basic Metabolic Panel: Recent Labs  Lab 06/16/21 1928 06/17/21 0423  NA 125* 131*  K 3.6 3.3*  CL 90* 101  CO2 24 22  GLUCOSE 115* 111*  BUN 19 13  CREATININE 1.15* 0.93  CALCIUM 8.1* 7.4*  MG  --  2.0  PHOS  --  2.8   GFR: Estimated Creatinine Clearance: 53.9 mL/min (by C-G formula based on SCr of 0.93 mg/dL). Recent Labs  Lab 06/16/21 1928 06/17/21 0423  WBC 3.1* 3.0*  LATICACIDVEN 1.7  --     Liver Function Tests: No results for input(s): AST, ALT, ALKPHOS, BILITOT, PROT, ALBUMIN in the last 168 hours. No results for input(s): LIPASE, AMYLASE in the last 168 hours. No results for input(s): AMMONIA in the last 168 hours.  ABG No results found for: PHART, PCO2ART, PO2ART, HCO3, TCO2, ACIDBASEDEF, O2SAT   Coagulation Profile: No results for input(s): INR, PROTIME in the last 168 hours.  Cardiac Enzymes: No results for input(s): CKTOTAL, CKMB, CKMBINDEX, TROPONINI in the last 168 hours.  HbA1C: No results found for: HGBA1C  CBG: Recent Labs  Lab 06/17/21 0047 06/17/21 0344 06/17/21 0815  GLUCAP 133* 92 161*    Review of Systems:   All negative; except for those that are bolded, which indicate positives.  Constitutional: weight loss, weight gain, night sweats, fevers, chills, fatigue, weakness.  HEENT: headaches, sore throat, sneezing, nasal congestion, post nasal drip,  difficulty swallowing, tooth/dental problems, visual complaints, visual changes, ear aches. Neuro: difficulty with speech, weakness, numbness, ataxia. CV:  chest pain, orthopnea, PND, swelling in lower extremities, dizziness, palpitations, syncope.  Resp: cough, hemoptysis, dyspnea, wheezing. GI: heartburn, indigestion, abdominal pain, nausea, vomiting, diarrhea, constipation, change in bowel habits, loss of appetite, hematemesis, melena, hematochezia.  GU: dysuria, change in color of urine, urgency or frequency, flank pain, hematuria. MSK: joint pain or swelling, decreased range of motion. Psych: change in mood or affect, depression, anxiety, suicidal ideations, homicidal ideations. Skin: rash, itching, bruising.  Critical care time: 32 minutes    Hayden Pedro, AGACNP-BC [facility]  PCCM Pgr: [phone]

## 2021-06-17 NOTE — ED Provider Notes (Signed)
.  Central Line  Date/Time: 06/17/2021 9:01 PM Performed by: Lianne Cure, DO Authorized by: Lianne Cure, DO   Consent:    Consent obtained:  Verbal   Consent given by:  Patient   Risks, benefits, and alternatives were discussed: yes     Risks discussed:  Arterial puncture, bleeding, incorrect placement and infection   Alternatives discussed:  No treatment Universal protocol:    Immediately prior to procedure, a time out was called: yes     Patient identity confirmed:  Verbally with patient, arm band and provided demographic data Pre-procedure details:    Indication(s): central venous access     Hand hygiene: Hand hygiene performed prior to insertion     Sterile barrier technique: All elements of maximal sterile technique followed     Skin preparation:  Chlorhexidine   Skin preparation agent: Skin preparation agent completely dried prior to procedure   Anesthesia:    Anesthesia method:  Local infiltration Procedure details:    Location:  R femoral   Landmarks identified: yes     Ultrasound guidance: yes     Ultrasound guidance timing: real time     Number of attempts:  2 Post-procedure details:    Post-procedure:  Dressing applied and line sutured   Assessment:  Blood return through all ports and free fluid flow   Procedure completion:  Tolerated well, no immediate complications    Lianne Cure, DO 78/29/56 2102

## 2021-06-17 NOTE — Progress Notes (Signed)
Protocol consult received for US guided IV due to vasopressor. Pt currently has central line and vasopressor is not infusing. Consult cleared.

## 2021-06-17 NOTE — Progress Notes (Signed)
PHARMACY - PHYSICIAN COMMUNICATION CRITICAL VALUE ALERT - BLOOD CULTURE IDENTIFICATION (BCID)  Nicole Campbell is an 77 y.o. female who presented to [facility] on 06/16/2021 with a chief complaint of sepsis/neutropenia  1 of 4 bottles with GPC, staph epi, likely contaminant  Current antibiotics: Vancomycin and Cefepime  Changes to prescribed antibiotics recommended:  Patient is on recommended antibiotics - No changes needed  Results for orders placed or performed during the hospital encounter of 06/16/21  Blood Culture ID Panel (Reflexed) (Collected: 06/16/2021  7:25 PM)  Result Value Ref Range   Enterococcus faecalis NOT DETECTED NOT DETECTED   Enterococcus Faecium NOT DETECTED NOT DETECTED   Listeria monocytogenes NOT DETECTED NOT DETECTED   Staphylococcus species DETECTED (A) NOT DETECTED   Staphylococcus aureus (BCID) NOT DETECTED NOT DETECTED   Staphylococcus epidermidis DETECTED (A) NOT DETECTED   Staphylococcus lugdunensis NOT DETECTED NOT DETECTED   Streptococcus species NOT DETECTED NOT DETECTED   Streptococcus agalactiae NOT DETECTED NOT DETECTED   Streptococcus pneumoniae NOT DETECTED NOT DETECTED   Streptococcus pyogenes NOT DETECTED NOT DETECTED   A.calcoaceticus-baumannii NOT DETECTED NOT DETECTED   Bacteroides fragilis NOT DETECTED NOT DETECTED   Enterobacterales NOT DETECTED NOT DETECTED   Enterobacter cloacae complex NOT DETECTED NOT DETECTED   Escherichia coli NOT DETECTED NOT DETECTED   Klebsiella aerogenes NOT DETECTED NOT DETECTED   Klebsiella oxytoca NOT DETECTED NOT DETECTED   Klebsiella pneumoniae NOT DETECTED NOT DETECTED   Proteus species NOT DETECTED NOT DETECTED   Salmonella species NOT DETECTED NOT DETECTED   Serratia marcescens NOT DETECTED NOT DETECTED   Haemophilus influenzae NOT DETECTED NOT DETECTED   Neisseria meningitidis NOT DETECTED NOT DETECTED   Pseudomonas aeruginosa NOT DETECTED NOT DETECTED   Stenotrophomonas maltophilia NOT  DETECTED NOT DETECTED   Candida albicans NOT DETECTED NOT DETECTED   Candida auris NOT DETECTED NOT DETECTED   Candida glabrata NOT DETECTED NOT DETECTED   Candida krusei NOT DETECTED NOT DETECTED   Candida parapsilosis NOT DETECTED NOT DETECTED   Candida tropicalis NOT DETECTED NOT DETECTED   Cryptococcus neoformans/gattii NOT DETECTED NOT DETECTED   Methicillin resistance mecA/C DETECTED (A) NOT DETECTED    Tad Moore 06/17/2021  10:57 PM

## 2021-06-17 NOTE — TOC Initial Note (Signed)
Transition of Care [facility]) - Initial/Assessment Note    Patient Details  Name: Nicole Campbell MRN: [id number] Date of Birth: 1945/10/18  Transition of Care [facility]) CM/SW Contact:    Angelita Ingles, RN Phone Number:[phone]  06/17/2021, 4:23 PM  Clinical Narrative:                  Transition of Care [facility]) Screening Note   Patient Details  Name: Nicole Campbell Date of Birth: 1945/07/09   Transition of Care [facility]) CM/SW Contact:    Angelita Ingles, RN Phone Number: 06/17/2021, 4:23 PM    Transition of Care Department [facility]) has reviewed patient and no TOC needs have been identified at this time. We will continue to monitor patient advancement through interdisciplinary progression rounds.           Patient Goals and CMS Choice        Expected Discharge Plan and Services                                                Prior Living Arrangements/Services                       Activities of Daily Living Home Assistive Devices/Equipment: None ADL Screening (condition at time of admission) Patient's cognitive ability adequate to safely complete daily activities?: Yes Is the patient deaf or have difficulty hearing?: Yes (tinnitis) Does the patient have difficulty seeing, even when wearing glasses/contacts?: No Does the patient have difficulty concentrating, remembering, or making decisions?: No Patient able to express need for assistance with ADLs?: Yes Does the patient have difficulty dressing or bathing?: No Independently performs ADLs?: Yes (appropriate for developmental age) Does the patient have difficulty walking or climbing stairs?: No Weakness of Legs: None Weakness of Arms/Hands: None  Permission Sought/Granted                  Emotional Assessment              Admission diagnosis:  Hyponatremia [E87.1] Febrile neutropenia ([place]) [D70.9, R50.81] Neutropenic fever ([place]) [D70.9, R50.81] Patient Active Problem List    Diagnosis Date Noted   Febrile neutropenia ([place]) 06/16/2021   Neutropenic fever ([place]) 06/16/2021   Palpitations 12/17/2020   Chronic cough 07/13/2020   Goals of care, counseling/discussion 08/21/2019   DOE (dyspnea on exertion) 08/07/2019   Pleural effusion, bilateral 08/07/2019   Hypothyroidism, acquired 08/07/2019   CLL (chronic lymphocytic leukemia) ([place]) 07/10/2018   PCP:  [name], MD Pharmacy:   [facility] [address], [place] [address] Phone: [phone] Fax: [phone]     Social Determinants of Health (SDOH) Interventions    Readmission Risk Interventions No flowsheet data found.

## 2021-06-17 NOTE — Progress Notes (Signed)
Initial Nutrition Assessment  DOCUMENTATION CODES:   Not applicable  INTERVENTION:   Ensure Enlive po BID, each supplement provides 350 kcal and 20 grams of protein.  MVI po daily   Pt at moderate refeed risk; recommend monitor potassium, magnesium and phosphorus labs daily until stable  NUTRITION DIAGNOSIS:   Increased nutrient needs related to chronic illness (CLL) as evidenced by estimated needs  GOAL:   Patient will meet greater than or equal to 90% of their needs  MONITOR:   PO intake, Supplement acceptance, Labs, Weight trends, Skin, I & O's  REASON FOR ASSESSMENT:   Malnutrition Screening Tool    ASSESSMENT:   76 y/o female with h/o HTN, RA, hypothyroidism and CLL who is admitted with neutropenic fever, congestion and AKI.  Met with pt in room today. Pt reports decreased appetite and oral intake for 3-4 days pta as she reports that her sinuses were congested and that she could not taste. Pt reports that her appetite and oral intake is improved in hospital. Pt reports eating 75% of her breakfast this morning and was eating lunch at time of RD visit. Pt reports that she does enjoy all flavors of the Ensure and she has been drinking two of these per day. Recommend continue Ensure supplements. RD will add MVI daily to help pt meet her estimated needs. Pt is likely at moderate refeed risk. Per chart, pt appears fairly weight stable at baseline; pt currently up ~8lbs from her last documented weight on 04/21/21. Pt is noted to have swelling and redness in her bilateral lower extremities; this is warm to the touch. Pt reports intermittent swelling at baseline r/t some of her medications. Pt reports that she usually takes diuretics for this at home but reports that she has not taken any diuretics in hospital.   Medications reviewed and include: heparin, synthroid, albumin, cefepime, levophed, KCl, vancomycin  Labs reviewed: Na 131(L), K 3.3(L), P 2.8 wnl, Mg 2.0 wnl Wbc- 2.8(L),  Hgb 9.9(L), Hct 31.1(L) Cbgs- 114, 161, 92, 133 x 24 hrs  NUTRITION - FOCUSED PHYSICAL EXAM:  Flowsheet Row Most Recent Value  Orbital Region No depletion  Upper Arm Region No depletion  Thoracic and Lumbar Region No depletion  Buccal Region No depletion  Temple Region No depletion  Clavicle Bone Region Mild depletion  Clavicle and Acromion Bone Region Mild depletion  Scapular Bone Region No depletion  Dorsal Hand Moderate depletion  Patellar Region No depletion  Anterior Thigh Region No depletion  Posterior Calf Region No depletion  Edema (RD Assessment) None  Hair Reviewed  Eyes Reviewed  Mouth Reviewed  Skin Reviewed  Nails Reviewed   Diet Order:   Diet Order             Diet regular Room service appropriate? Yes; Fluid consistency: Thin  Diet effective now                  EDUCATION NEEDS:   Education needs have been addressed  Skin:  Skin Assessment: Reviewed RN Assessment (ecchymosis)  Last BM:  2/17- Type 6  Height:   Ht Readings from Last 1 Encounters:  06/17/21 '5\' 6"'  (1.676 m)    Weight:   Wt Readings from Last 1 Encounters:  06/17/21 71.9 kg    Ideal Body Weight:  59 kg  BMI:  Body mass index is 25.58 kg/m.  Estimated Nutritional Needs:   Kcal:  1600-1800kcal/day  Protein:  80-90g/day  Fluid:  1.5-1.7L/day  Koleen Distance MS, RD,  LDN Please refer to [facility] for RD and/or RD on-call/weekend/after hours pager

## 2021-06-18 DIAGNOSIS — R5081 Fever presenting with conditions classified elsewhere: Secondary | ICD-10-CM | POA: Diagnosis not present

## 2021-06-18 DIAGNOSIS — D709 Neutropenia, unspecified: Secondary | ICD-10-CM | POA: Diagnosis not present

## 2021-06-18 LAB — PHOSPHORUS: Phosphorus: 1.7 mg/dL — ABNORMAL LOW (ref 2.5–4.6)

## 2021-06-18 LAB — CBC WITH DIFFERENTIAL/PLATELET
Abs Immature Granulocytes: 0 10*3/uL (ref 0.00–0.07)
Basophils Absolute: 0.1 10*3/uL (ref 0.0–0.1)
Basophils Relative: 4 %
Eosinophils Absolute: 0.1 10*3/uL (ref 0.0–0.5)
Eosinophils Relative: 3 %
HCT: 27 % — ABNORMAL LOW (ref 36.0–46.0)
Hemoglobin: 8.6 g/dL — ABNORMAL LOW (ref 12.0–15.0)
Lymphocytes Relative: 39 %
Lymphs Abs: 1.2 10*3/uL (ref 0.7–4.0)
MCH: 28 pg (ref 26.0–34.0)
MCHC: 31.9 g/dL (ref 30.0–36.0)
MCV: 87.9 fL (ref 80.0–100.0)
Monocytes Absolute: 1.5 10*3/uL — ABNORMAL HIGH (ref 0.1–1.0)
Monocytes Relative: 46 %
Neutro Abs: 0.3 10*3/uL — CL (ref 1.7–7.7)
Neutrophils Relative %: 8 %
Platelets: 159 10*3/uL (ref 150–400)
RBC: 3.07 MIL/uL — ABNORMAL LOW (ref 3.87–5.11)
RDW: 14.2 % (ref 11.5–15.5)
WBC: 3.2 10*3/uL — ABNORMAL LOW (ref 4.0–10.5)
nRBC: 0 % (ref 0.0–0.2)

## 2021-06-18 LAB — BASIC METABOLIC PANEL
Anion gap: 8 (ref 5–15)
BUN: 12 mg/dL (ref 8–23)
CO2: 23 mmol/L (ref 22–32)
Calcium: 8.2 mg/dL — ABNORMAL LOW (ref 8.9–10.3)
Chloride: 105 mmol/L (ref 98–111)
Creatinine, Ser: 0.81 mg/dL (ref 0.44–1.00)
GFR, Estimated: >60 mL/min (ref >60)
Glucose, Bld: 115 mg/dL — ABNORMAL HIGH (ref 70–99)
Potassium: 3.5 mmol/L (ref 3.5–5.1)
Sodium: 136 mmol/L (ref 135–145)

## 2021-06-18 LAB — PROCALCITONIN: Procalcitonin: 0.62 ng/mL

## 2021-06-18 LAB — GLUCOSE, CAPILLARY
Glucose-Capillary: 126 mg/dL — ABNORMAL HIGH (ref 70–99)
Glucose-Capillary: 141 mg/dL — ABNORMAL HIGH (ref 70–99)

## 2021-06-18 LAB — MAGNESIUM: Magnesium: 2.2 mg/dL (ref 1.7–2.4)

## 2021-06-18 MED ORDER — POTASSIUM PHOSPHATES 15 MMOLE/5ML IV SOLN
30.0000 mmol | Freq: Once | INTRAVENOUS | Status: AC
  Administered 2021-06-18: 30 mmol via INTRAVENOUS
  Filled 2021-06-18: qty 10

## 2021-06-18 MED ORDER — SODIUM CHLORIDE 0.9 % IV SOLN
2.0000 g | Freq: Three times a day (TID) | INTRAVENOUS | Status: DC
  Administered 2021-06-18 – 2021-06-20 (×6): 2 g via INTRAVENOUS
  Filled 2021-06-18 (×6): qty 2

## 2021-06-18 MED ORDER — POTASSIUM PHOSPHATES 15 MMOLE/5ML IV SOLN
30.0000 mmol | Freq: Once | INTRAVENOUS | Status: DC
  Filled 2021-06-18: qty 10

## 2021-06-18 MED ORDER — ACETAMINOPHEN 325 MG PO TABS
650.0000 mg | ORAL_TABLET | Freq: Four times a day (QID) | ORAL | Status: DC | PRN
  Administered 2021-06-18 – 2021-06-19 (×2): 650 mg via ORAL
  Filled 2021-06-18 (×2): qty 2

## 2021-06-18 MED ORDER — VANCOMYCIN HCL 1250 MG/250ML IV SOLN
1250.0000 mg | INTRAVENOUS | Status: DC
  Administered 2021-06-18 – 2021-06-19 (×2): 1250 mg via INTRAVENOUS
  Filled 2021-06-18 (×3): qty 250

## 2021-06-18 NOTE — Progress Notes (Signed)
Pharmacy Antibiotic Note  Nicole Campbell is a 76 y.o. female admitted on 06/16/2021 with  infection of unknown source .  Pharmacy has been consulted for Cefepime and vancomycin dosing.  Plan: -Cefepime 2 gm IV Q8 hours -Vancomycin 1250 mg IV Q 24 hrs. Goal AUC 400-550. (eAUC 465, Scr 0.8) -Monitor CBC, renal fx, cultures and clinical progress -Vanc levels as indicated    Height: 5\' 6"  (167.6 cm) Weight: 71.2 kg (156 lb 15.5 oz) IBW/kg (Calculated) : 59.3  Temp (24hrs), Avg:99.1 F (37.3 C), Min:98.6 F (37 C), Max:100 F (37.8 C)  Recent Labs  Lab 06/16/21 1928 06/17/21 0423 06/17/21 1037 06/17/21 1400 06/17/21 1959 06/18/21 0202  WBC 3.1* 2.8*   3.0*  --   --   --  3.2*  CREATININE 1.15* 0.93  --   --  0.85 0.81  LATICACIDVEN 1.7  --  2.0* 1.9  --   --      Estimated Creatinine Clearance: 61.7 mL/min (by C-G formula based on SCr of 0.81 mg/dL).    Allergies  Allergen Reactions   Methotrexate Other (See Comments)    Other reaction(s): horrible lower back pain   Erythromycin Palpitations    Antimicrobials this admission: Cefepime 2/16 >>  Vancomycin 2/16 >>   Dose adjustments this admission:   Microbiology results: 2/16 BCx: MRSE (1/4 bottles)  Thank you for allowing pharmacy to be a part of this patients care.  Donnald Garre, PharmD Clinical Pharmacist  Please check AMION for all [place] numbers After 10:00 PM, call [place] [phone]

## 2021-06-18 NOTE — Progress Notes (Signed)
Phos 1.7, K+ 3.5 Replaced per protocol

## 2021-06-18 NOTE — Progress Notes (Addendum)
PROGRESS NOTE  Nicole Campbell MHD:[id number] DOB: 05-10-1945 DOA: 06/16/2021 PCP: [name], MD  HPI/Recap of past 24 hours: Nicole Campbell is a 76 y.o. 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 Alen Blew).  She presented to [facility] ED 2/16 with flu like symptoms x 5 days including sore throat, chills, congestion, myalgias, fevers, decreased appetite. She had called PCP who prescribed Amoxicillin and Doxycycline for possible sinus infection; however, had no relief. Per husband, she began to get weaker 2 days prior to ED presentation and also had increased fatigue.  Due to ongoing hypotension, PCCM called for transfer to [facility] for further management.  Prior to transfer, BP improved and pt was never started on vasopressors.  Due to concern for chronic sinusitis CT maxillofacial was obtained and confirms sinusitis.  ENT curbsided on 06/18/2021, Dr. Constance Holster will see outpatient, reviewed imaging, mild chronic sinusitis seen on CT scan.  06/18/2021: Patient was seen and examined at bedside.  She reports persistent nasal congestion with sinus pain.  Assessment/Plan: Principal Problem:   Febrile neutropenia (HCC) Active Problems:   Neutropenic fever (HCC)  Neutropenic fever with underlying CLL - unclear etiology at this point.  Lactic acid reassuring. Not likely UTI. Effusion is chronic and smaller than in the past. CT confirms sinusitis. - Continue empiric abx with Vanc, Cefepime. May need ENT consult if not improving with empiric IV antibiotics.  - Follow cultures. - Continue fluids. Place TED hose given chronic LE edema.  - Hold home Imbruvica.   Mild chronic sinusitis, seen on CT scan Confirmed on maxillofacial CT scan done without contrast on 06/17/2021 ENT, Dr. Constance Holster, curb sided, will follow-up outpatient Continue IV antibiotics for now Rest of management per the   Sepsis secondary to  sinusitis; shock resolved. Normal cortisol (not drawn at 8AM). -con't antibiotics -off NE    Small bilateral effusions - small on CXR, not sure enough present to warrant thora currently. -follow CXR, if enlarging can consider thora.   Hyponatremia with AKI - suspect hypovolemia, improving with volume. Had poor PO intake PTA. -follow up BMP tomorrow -hold HCTZ   Hypokalemia-repleted, monitor   Hx HTN, mitral valve prolapse. - Hold home Toprol-XL, spironolactone- HCTZ   Hx Hypothyroidism; TSH WNL this admission -con't synthroid   Hx RA (not on any meds). - No interventions required.  Positive blood culture, Staphylococcus epidermidis Suspect contaminant         Best practice (evaluated daily):  Diet/type: Regular consistency (see orders) DVT prophylaxis: prophylactic heparin SQ 3 times daily GI prophylaxis: N/A Lines: Central line Foley:  N/A Code Status:  full code Last date of multidisciplinary goals of care discussion: None yet. Consult: ENT, PCCM.   Labs   CBC: Last Labs       Recent Labs  Lab 06/16/21 1928 06/17/21 0423  WBC 3.1* 3.0*  NEUTROABS 0.1*  --   HGB 11.7* 10.0*  HCT 35.2* 30.4*  MCV 85.9 87.4  PLT 212 164           Status is: Inpatient Patient requires at least 2 midnights for further evaluation and treatment of present condition.            Objective: Vitals:   06/18/21 0800 06/18/21 0900 06/18/21 1000 06/18/21 1100  BP: (!) 117/93 (!) 122/55 (!) 120/45 (!) 117/54  Pulse: 95 86 100 86  Resp: (!) 22 (!) 24 (!)  24 (!) 25  Temp: 98.8 F (37.1 C)     TempSrc: Oral     SpO2: 95% 100% 91% 98%  Weight:      Height:        Intake/Output Summary (Last 24 hours) at 06/18/2021 1250 Last data filed at 06/18/2021 1123 Gross per 24 hour  Intake 1781.61 ml  Output 1900 ml  Net -118.39 ml   Filed Weights   06/17/21 0055 06/17/21 0147 06/18/21 0355  Weight: 71.9 kg 71.9 kg 71.2 kg    Exam:  General: 76 y.o. year-old female  well developed well nourished in no acute distress.  Alert and oriented x3. Cardiovascular: Regular rate and rhythm with no rubs or gallops.  No thyromegaly or JVD noted.   Respiratory: Clear to auscultation with no wheezes or rales. Good inspiratory effort.  Tenderness with palpation of paranasal sinuses Abdomen: Soft nontender nondistended with normal bowel sounds x4 quadrants. Musculoskeletal: No lower extremity edema. 2/4 pulses in all 4 extremities. Skin: No ulcerative lesions noted or rashes, Psychiatry: Mood is appropriate for condition and setting   Data Reviewed: CBC: Recent Labs  Lab 06/16/21 1928 06/17/21 0423 06/18/21 0202  WBC 3.1* 2.8*   3.0* 3.2*  NEUTROABS 0.1* 0.1* 0.3*  HGB 11.7* 9.9*   10.0* 8.6*  HCT 35.2* 31.1*   30.4* 27.0*  MCV 85.9 88.6   87.4 87.9  PLT 212 186   164 993   Basic Metabolic Panel: Recent Labs  Lab 06/16/21 1928 06/17/21 0423 06/17/21 1959 06/18/21 0202  NA 125* 131* 133* 136  K 3.6 3.3* 3.8 3.5  CL 90* 101 104 105  CO2 24 22 21* 23  GLUCOSE 115* 111* 170* 115*  BUN 19 13 14 12   CREATININE 1.15* 0.93 0.85 0.81  CALCIUM 8.1* 7.4* 8.0* 8.2*  MG  --  2.0  --  2.2  PHOS  --  2.8  --  1.7*   GFR: Estimated Creatinine Clearance: 61.7 mL/min (by C-G formula based on SCr of 0.81 mg/dL). Liver Function Tests: Recent Labs  Lab 06/17/21 1037  AST 20  ALT 30  ALKPHOS 73  BILITOT 1.0  PROT 4.7*  ALBUMIN 2.4*   No results for input(s): LIPASE, AMYLASE in the last 168 hours. No results for input(s): AMMONIA in the last 168 hours. Coagulation Profile: No results for input(s): INR, PROTIME in the last 168 hours. Cardiac Enzymes: No results for input(s): CKTOTAL, CKMB, CKMBINDEX, TROPONINI in the last 168 hours. BNP (last 3 results) Recent Labs    07/01/20 1529  PROBNP 18.0   HbA1C: No results for input(s): HGBA1C in the last 72 hours. CBG: Recent Labs  Lab 06/17/21 0344 06/17/21 0815 06/17/21 1200 06/17/21 2113  06/18/21 0800  GLUCAP 92 161* 114* 120* 126*   Lipid Profile: No results for input(s): CHOL, HDL, LDLCALC, TRIG, CHOLHDL, LDLDIRECT in the last 72 hours. Thyroid Function Tests: Recent Labs    06/17/21 0112  TSH 2.281   Anemia Panel: No results for input(s): VITAMINB12, FOLATE, FERRITIN, TIBC, IRON, RETICCTPCT in the last 72 hours. Urine analysis:    Component Value Date/Time   COLORURINE YELLOW 06/17/2021 0207   APPEARANCEUR CLEAR 06/17/2021 0207   LABSPEC 1.005 06/17/2021 0207   PHURINE 6.0 06/17/2021 0207   GLUCOSEU NEGATIVE 06/17/2021 0207   HGBUR MODERATE (A) 06/17/2021 0207   BILIRUBINUR NEGATIVE 06/17/2021 0207   KETONESUR NEGATIVE 06/17/2021 0207   PROTEINUR NEGATIVE 06/17/2021 0207   NITRITE NEGATIVE 06/17/2021 0207   LEUKOCYTESUR NEGATIVE  06/17/2021 0207   Sepsis Labs: @LABRCNTIP (procalcitonin:4,lacticidven:4)  ) Recent Results (from the past 240 hour(s))  Culture, blood (routine x 2)     Status: Abnormal (Preliminary result)   Collection Time: 06/16/21  7:25 PM   Specimen: Right Antecubital; Blood  Result Value Ref Range Status   Specimen Description   Final    RIGHT ANTECUBITAL Performed at [facility], [address] [place], [place] [address]    Special Requests   Final    BOTTLES DRAWN AEROBIC AND ANAEROBIC Blood Culture adequate volume Performed at [facility], [address] [place], [place] [address]    Culture  Setup Time   Final    GRAM POSITIVE COCCI IN BOTH AEROBIC AND ANAEROBIC BOTTLES CRITICAL RESULT CALLED TO, READ BACK BY AND VERIFIED WITH: PHARMD L. POWELL [id number] @2251  FH     Culture (A)  Final    STAPHYLOCOCCUS EPIDERMIDIS THE SIGNIFICANCE OF ISOLATING THIS ORGANISM FROM A SINGLE SET OF BLOOD CULTURES WHEN MULTIPLE SETS ARE DRAWN IS UNCERTAIN. PLEASE NOTIFY THE MICROBIOLOGY DEPARTMENT WITHIN ONE WEEK IF SPECIATION AND SENSITIVITIES ARE REQUIRED. Performed at [facility], [place] [address],  [place], [place] [address]    Report Status PENDING  Incomplete  Blood Culture ID Panel (Reflexed)     Status: Abnormal   Collection Time: 06/16/21  7:25 PM  Result Value Ref Range Status   Enterococcus faecalis NOT DETECTED NOT DETECTED Final   Enterococcus Faecium NOT DETECTED NOT DETECTED Final   Listeria monocytogenes NOT DETECTED NOT DETECTED Final   Staphylococcus species DETECTED (A) NOT DETECTED Final    Comment: RBV PHARMD L. POWELL [id number] @2252  FH    Staphylococcus aureus (BCID) NOT DETECTED NOT DETECTED Final   Staphylococcus epidermidis DETECTED (A) NOT DETECTED Final    Comment: Methicillin (oxacillin) resistant coagulase negative staphylococcus. Possible blood culture contaminant (unless isolated from more than one blood culture draw or clinical case suggests pathogenicity). No antibiotic treatment is indicated for blood  culture contaminants. CRITICAL RESULT CALLED TO, READ BACK BY AND VERIFIED WITH: PHARMD L. POWELL [id number] @2252  FH    Staphylococcus lugdunensis NOT DETECTED NOT DETECTED Final   Streptococcus species NOT DETECTED NOT DETECTED Final   Streptococcus agalactiae NOT DETECTED NOT DETECTED Final   Streptococcus pneumoniae NOT DETECTED NOT DETECTED Final   Streptococcus pyogenes NOT DETECTED NOT DETECTED Final   A.calcoaceticus-baumannii NOT DETECTED NOT DETECTED Final   Bacteroides fragilis NOT DETECTED NOT DETECTED Final   Enterobacterales NOT DETECTED NOT DETECTED Final   Enterobacter cloacae complex NOT DETECTED NOT DETECTED Final   Escherichia coli NOT DETECTED NOT DETECTED Final   Klebsiella aerogenes NOT DETECTED NOT DETECTED Final   Klebsiella oxytoca NOT DETECTED NOT DETECTED Final   Klebsiella pneumoniae NOT DETECTED NOT DETECTED Final   Proteus species NOT DETECTED NOT DETECTED Final   Salmonella species NOT DETECTED NOT DETECTED Final   Serratia marcescens NOT DETECTED NOT DETECTED Final   Haemophilus influenzae NOT DETECTED NOT DETECTED Final    Neisseria meningitidis NOT DETECTED NOT DETECTED Final   Pseudomonas aeruginosa NOT DETECTED NOT DETECTED Final   Stenotrophomonas maltophilia NOT DETECTED NOT DETECTED Final   Candida albicans NOT DETECTED NOT DETECTED Final   Candida auris NOT DETECTED NOT DETECTED Final   Candida glabrata NOT DETECTED NOT DETECTED Final   Candida krusei NOT DETECTED NOT DETECTED Final   Candida parapsilosis NOT DETECTED NOT DETECTED Final   Candida tropicalis NOT DETECTED NOT DETECTED Final  Cryptococcus neoformans/gattii NOT DETECTED NOT DETECTED Final   Methicillin resistance mecA/C DETECTED (A) NOT DETECTED Final    Comment: CRITICAL RESULT CALLED TO, READ BACK BY AND VERIFIED WITH: PHARMD LFlorene Glen [id number] @2252  FH Performed at [facility], [address] [place], [place] [address]   Group A Strep by PCR     Status: None   Collection Time: 06/16/21  7:28 PM   Specimen: Throat; Sterile Swab  Result Value Ref Range Status   Group A Strep by PCR NOT DETECTED NOT DETECTED Final    Comment: Performed at [facility], [address] [place], [place] [address]  Resp Panel by RT-PCR (Flu A&B, Covid) Nasopharyngeal Swab     Status: None   Collection Time: 06/16/21  7:28 PM   Specimen: Nasopharyngeal Swab; Nasopharyngeal(NP) swabs in vial transport medium  Result Value Ref Range Status   SARS Coronavirus 2 by RT PCR NEGATIVE NEGATIVE Final    Comment: (NOTE) SARS-CoV-2 target nucleic acids are NOT DETECTED.  The SARS-CoV-2 RNA is generally detectable in upper respiratory specimens during the acute phase of infection. The lowest concentration of SARS-CoV-2 viral copies this assay can detect is 138 copies/mL. A negative result does not preclude SARS-Cov-2 infection and should not be used as the sole basis for treatment or other patient management decisions. A negative result may occur with  improper specimen collection/handling, submission of specimen other than nasopharyngeal swab,  presence of viral mutation(s) within the areas targeted by this assay, and inadequate number of viral copies(<138 copies/mL). A negative result must be combined with clinical observations, patient history, and epidemiological information. The expected result is Negative.  Fact Sheet for Patients:  EntrepreneurPulse.com.au  Fact Sheet for Healthcare Providers:  IncredibleEmployment.be  This test is no t yet approved or cleared by the [place] FDA and  has been authorized for detection and/or diagnosis of SARS-CoV-2 by FDA under an Emergency Use Authorization (EUA). This EUA will remain  in effect (meaning this test can be used) for the duration of the COVID-19 declaration under Section 564(b)(1) of the Act, 21 U.S.C.section 360bbb-3(b)(1), unless the authorization is terminated  or revoked sooner.       Influenza A by PCR NEGATIVE NEGATIVE Final   Influenza B by PCR NEGATIVE NEGATIVE Final    Comment: (NOTE) The Xpert Xpress SARS-CoV-2/FLU/RSV plus assay is intended as an aid in the diagnosis of influenza from Nasopharyngeal swab specimens and should not be used as a sole basis for treatment. Nasal washings and aspirates are unacceptable for Xpert Xpress SARS-CoV-2/FLU/RSV testing.  Fact Sheet for Patients: EntrepreneurPulse.com.au  Fact Sheet for Healthcare Providers: IncredibleEmployment.be  This test is not yet approved or cleared by the [place] FDA and has been authorized for detection and/or diagnosis of SARS-CoV-2 by FDA under an Emergency Use Authorization (EUA). This EUA will remain in effect (meaning this test can be used) for the duration of the COVID-19 declaration under Section 564(b)(1) of the Act, 21 U.S.C. section 360bbb-3(b)(1), unless the authorization is terminated or revoked.  Performed at [facility] [address] [place], [place] [address]   Culture,  blood (routine x 2)     Status: None (Preliminary result)   Collection Time: 06/16/21  7:30 PM   Specimen: BLOOD LEFT FOREARM  Result Value Ref Range Status   Specimen Description   Final    BLOOD LEFT FOREARM Performed at [facility], [address] [place],  [place] [address]    Special Requests   Final    BOTTLES DRAWN AEROBIC AND ANAEROBIC Blood Culture adequate volume Performed at [facility], [address] [place], [place] [address]    Culture   Final    NO GROWTH 2 DAYS Performed at [facility], [place] [address] [place], [place] [address]    Report Status PENDING  Incomplete  MRSA Next Gen by PCR, Nasal     Status: None   Collection Time: 06/17/21  2:07 AM   Specimen: Nasal Mucosa; Nasal Swab  Result Value Ref Range Status   MRSA by PCR Next Gen NOT DETECTED NOT DETECTED Final    Comment: (NOTE) The GeneXpert MRSA Assay (FDA approved for NASAL specimens only), is one component of a comprehensive MRSA colonization surveillance program. It is not intended to diagnose MRSA infection nor to guide or monitor treatment for MRSA infections. Test performance is not FDA approved in patients less than 31 years old. Performed at [facility], [place] [address], [place], [place] [address]   Respiratory (~20 pathogens) panel by PCR     Status: None   Collection Time: 06/17/21 10:48 AM   Specimen: Nasopharyngeal Swab; Respiratory  Result Value Ref Range Status   Adenovirus NOT DETECTED NOT DETECTED Final   Coronavirus 229E NOT DETECTED NOT DETECTED Final    Comment: (NOTE) The Coronavirus on the Respiratory Panel, DOES NOT test for the novel  Coronavirus (2019 nCoV)    Coronavirus HKU1 NOT DETECTED NOT DETECTED Final   Coronavirus NL63 NOT DETECTED NOT DETECTED Final   Coronavirus OC43 NOT DETECTED NOT DETECTED Final   Metapneumovirus NOT DETECTED NOT DETECTED Final   Rhinovirus / Enterovirus NOT DETECTED NOT DETECTED Final   Influenza A NOT  DETECTED NOT DETECTED Final   Influenza B NOT DETECTED NOT DETECTED Final   Parainfluenza Virus 1 NOT DETECTED NOT DETECTED Final   Parainfluenza Virus 2 NOT DETECTED NOT DETECTED Final   Parainfluenza Virus 3 NOT DETECTED NOT DETECTED Final   Parainfluenza Virus 4 NOT DETECTED NOT DETECTED Final   Respiratory Syncytial Virus NOT DETECTED NOT DETECTED Final   Bordetella pertussis NOT DETECTED NOT DETECTED Final   Bordetella Parapertussis NOT DETECTED NOT DETECTED Final   Chlamydophila pneumoniae NOT DETECTED NOT DETECTED Final   Mycoplasma pneumoniae NOT DETECTED NOT DETECTED Final    Comment: Performed at [facility], [place]. [address] [place], [place] [address]      Studies: CT MAXILLOFACIAL WO CONTRAST  Result Date: 06/17/2021 CLINICAL DATA:  Maxillary/facial abscess EXAM: CT MAXILLOFACIAL WITHOUT CONTRAST TECHNIQUE: Multidetector CT imaging of the maxillofacial structures was performed. Multiplanar CT image reconstructions were also generated. RADIATION DOSE REDUCTION: This exam was performed according to the departmental dose-optimization program which includes automated exposure control, adjustment of the mA and/or kV according to patient size and/or use of iterative reconstruction technique. COMPARISON:  None. FINDINGS: Osseous: No fracture or mandibular dislocation. No destructive process. No periapical lucency. Orbits: Negative. No traumatic or inflammatory finding. Sinuses: Partial opacification of the ethmoid air cells. Mucosal thickening of the bilateral maxillary sinuses. No air-fluid levels. Soft tissues: Negative. Limited intracranial: No significant or unexpected finding. IMPRESSION: 1. No displaced fracture or dislocation of the facial bones. 2. No periapical lucency. No evidence of soft tissue abscess. 3. Partial opacification of the ethmoid air cells and mucosal thickening of the bilateral maxillary sinuses. No air-fluid levels. Correlate for sinusitis. Electronically Signed    By: Cristie Hem  Cherly Beach M.D.   On: 06/17/2021 16:58    Scheduled Meds:  Chlorhexidine Gluconate Cloth  6 each Topical Daily   escitalopram  5 mg Oral Daily   feeding supplement  237 mL Oral BID BM   heparin  5,000 Units Subcutaneous Q8H   levothyroxine  25 mcg Oral Q0600   multivitamin with minerals  1 tablet Oral Daily   sodium chloride flush  10-40 mL Intracatheter Q12H    Continuous Infusions:  ceFEPime (MAXIPIME) IV 2 g (06/18/21 1123)   vancomycin 166.7 mL/hr at 06/18/21 1000     LOS: 1 day     Kayleen Memos, MD [facility] Pager [phone]  If 7PM-7AM, please contact night-coverage [email or web address] Password [facility] 06/18/2021, 12:50 PM

## 2021-06-18 NOTE — Plan of Care (Signed)
  Problem: Nutrition: Goal: Adequate nutrition will be maintained Outcome: Progressing   Problem: Pain Managment: Goal: General experience of comfort will improve Outcome: Progressing   Problem: Skin Integrity: Goal: Risk for impaired skin integrity will decrease Outcome: Progressing   

## 2021-06-19 LAB — CBC WITH DIFFERENTIAL/PLATELET
Abs Immature Granulocytes: 0.2 10*3/uL — ABNORMAL HIGH (ref 0.00–0.07)
Basophils Absolute: 0.1 10*3/uL (ref 0.0–0.1)
Basophils Relative: 1 %
Eosinophils Absolute: 0.2 10*3/uL (ref 0.0–0.5)
Eosinophils Relative: 3 %
HCT: 30.1 % — ABNORMAL LOW (ref 36.0–46.0)
Hemoglobin: 9.9 g/dL — ABNORMAL LOW (ref 12.0–15.0)
Lymphocytes Relative: 21 %
Lymphs Abs: 1.1 10*3/uL (ref 0.7–4.0)
MCH: 28.5 pg (ref 26.0–34.0)
MCHC: 32.9 g/dL (ref 30.0–36.0)
MCV: 86.7 fL (ref 80.0–100.0)
Metamyelocytes Relative: 1 %
Monocytes Absolute: 2.5 10*3/uL — ABNORMAL HIGH (ref 0.1–1.0)
Monocytes Relative: 47 %
Myelocytes: 1 %
Neutro Abs: 1.3 10*3/uL — ABNORMAL LOW (ref 1.7–7.7)
Neutrophils Relative %: 24 %
Platelets: 199 10*3/uL (ref 150–400)
Promyelocytes Relative: 2 %
RBC: 3.47 MIL/uL — ABNORMAL LOW (ref 3.87–5.11)
RDW: 14.5 % (ref 11.5–15.5)
WBC: 5.4 10*3/uL (ref 4.0–10.5)
nRBC: 0 % (ref 0.0–0.2)
nRBC: 0 /100 WBC

## 2021-06-19 LAB — BASIC METABOLIC PANEL
Anion gap: 8 (ref 5–15)
BUN: 8 mg/dL (ref 8–23)
CO2: 23 mmol/L (ref 22–32)
Calcium: 8.3 mg/dL — ABNORMAL LOW (ref 8.9–10.3)
Chloride: 104 mmol/L (ref 98–111)
Creatinine, Ser: 0.7 mg/dL (ref 0.44–1.00)
GFR, Estimated: >60 mL/min (ref >60)
Glucose, Bld: 113 mg/dL — ABNORMAL HIGH (ref 70–99)
Potassium: 3.4 mmol/L — ABNORMAL LOW (ref 3.5–5.1)
Sodium: 135 mmol/L (ref 135–145)

## 2021-06-19 LAB — MAGNESIUM: Magnesium: 2.3 mg/dL (ref 1.7–2.4)

## 2021-06-19 LAB — PHOSPHORUS: Phosphorus: 1.8 mg/dL — ABNORMAL LOW (ref 2.5–4.6)

## 2021-06-19 LAB — PROCALCITONIN: Procalcitonin: 0.44 ng/mL

## 2021-06-19 MED ORDER — POTASSIUM CHLORIDE CRYS ER 20 MEQ PO TBCR
40.0000 meq | EXTENDED_RELEASE_TABLET | Freq: Once | ORAL | Status: AC
  Administered 2021-06-19: 40 meq via ORAL
  Filled 2021-06-19: qty 2

## 2021-06-19 MED ORDER — POTASSIUM PHOSPHATES 15 MMOLE/5ML IV SOLN
30.0000 mmol | Freq: Once | INTRAVENOUS | Status: AC
  Administered 2021-06-19: 30 mmol via INTRAVENOUS
  Filled 2021-06-19: qty 10

## 2021-06-19 NOTE — Evaluation (Signed)
Physical Therapy Evaluation Patient Details Name: Nicole Campbell MRN: [id number] DOB: 05/05/1945 Today's Date: 06/19/2021  History of Present Illness  Nicole Campbell is a 76 y.o. 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 Alen Blew).  Clinical Impression   Pt admitted with above diagnosis. Lives at home with husband, in a 2-level home with 2 steps to enter; Prior to admission, pt was able to manage walking and ADLs independently; Presents to PT with generalized weakness and mild unsteadiness;  Pt currently with functional limitations due to the deficits listed below (see PT Problem List). Pt will benefit from skilled PT to increase their independence and safety with mobility to allow discharge to the venue listed below.          Recommendations for follow up therapy are one component of a multi-disciplinary discharge planning process, led by the attending physician.  Recommendations may be updated based on patient status, additional functional criteria and insurance authorization.  Follow Up Recommendations No PT follow up    Assistance Recommended at Discharge PRN  Patient can return home with the following  Assistance with cooking/housework    Equipment Recommendations None recommended by PT  Recommendations for Other Services       Functional Status Assessment Patient has had a recent decline in their functional status and demonstrates the ability to make significant improvements in function in a reasonable and predictable amount of time.     Precautions / Restrictions Precautions Precautions: None Restrictions Weight Bearing Restrictions: No      Mobility  Bed Mobility Overal bed mobility:  Independent                  Transfers Overall transfer level: Needs assistance Equipment used: None Transfers: Sit to/from Stand Sit to Stand: Supervision                Ambulation/Gait Ambulation/Gait assistance: Min guard Gait Distance (Feet): 120 Feet Assistive device: IV Pole Gait Pattern/deviations: Step-through pattern       General Gait Details: Mildly unsteday with erratic step width  Stairs            Wheelchair Mobility    Modified Rankin (Stroke Patients Only)       Balance Overall balance assessment: Mild deficits observed, not formally tested                                           Pertinent Vitals/Pain Pain Assessment Pain Assessment: No/denies pain    Home Living Family/patient expects to be discharged to:: Private residence Living Arrangements: Spouse/significant other Available Help at Discharge: Family;Available 24 hours/day Type of Home: House Home Access: Stairs to enter Entrance Stairs-Rails: Right Entrance Stairs-Number of Steps: 2   Home Layout: Able to live on main level with bedroom/bathroom;Two level Home Equipment: Shower seat - built in;Shower seat;BSC/3in1      Prior Function Prior Level of Function : Independent/Modified Independent                     Hand Dominance   Dominant Hand: Right    Extremity/Trunk Assessment   Upper Extremity  Assessment Upper Extremity Assessment: Defer to OT evaluation    Lower Extremity Assessment Lower Extremity Assessment: Generalized weakness (with sligth dyscoordination)       Communication   Communication: No difficulties  Cognition Arousal/Alertness: Awake/alert Behavior During Therapy: WFL for tasks assessed/performed Overall Cognitive Status: Within Functional Limits for tasks assessed                                          General Comments General comments (skin integrity, edema, etc.): Just feels week from  being in bed for 3 days    Exercises     Assessment/Plan    PT Assessment Patient needs continued PT services  PT Problem List Decreased strength;Decreased activity tolerance;Decreased balance;Decreased coordination       PT Treatment Interventions DME instruction;Gait training;Stair training;Functional mobility training;Therapeutic activities;Therapeutic exercise;Balance training    PT Goals (Current goals can be found in the Care Plan section)  Acute Rehab PT Goals Patient Stated Goal: Hopes to be home tomorrow PT Goal Formulation: With patient Time For Goal Achievement: 06/26/21 Potential to Achieve Goals: Good    Frequency Min 3X/week     Co-evaluation               AM-PAC PT "6 Clicks" Mobility  Outcome Measure Help needed turning from your back to your side while in a flat bed without using bedrails?: None Help needed moving from lying on your back to sitting on the side of a flat bed without using bedrails?: None Help needed moving to and from a bed to a chair (including a wheelchair)?: None Help needed standing up from a chair using your arms (e.g., wheelchair or bedside chair)?: None Help needed to walk in hospital room?: A Little Help needed climbing 3-5 steps with a railing? : A Little 6 Click Score: 22    End of Session Equipment Utilized During Treatment: Gait belt Activity Tolerance: Patient tolerated treatment well Patient left: in chair;with call bell/phone within reach Nurse Communication: Mobility status PT Visit Diagnosis: Unsteadiness on feet (R26.81)    Time: 0086-7619 PT Time Calculation (min) (ACUTE ONLY): 19 min   Charges:   PT Evaluation $PT Eval Low Complexity: [place], PT  Acute Rehabilitation Services Pager [phone] Office [place] 06/19/2021, 4:56 PM

## 2021-06-19 NOTE — Evaluation (Signed)
Occupational Therapy Evaluation and Discharge Patient Details Name: Nicole Campbell MRN: [id number] DOB: June 14, 1945 Today's Date: 06/19/2021   History of Present Illness Nicole Campbell is a 76 y.o. 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 Alen Blew).   Clinical Impression   This 76 yo female admitted with above presents to acute OT at close to her baseline of being independent (just feels weak from being in bed for 3 days). Husband is there 24/7. No further OT needs, we will sign off.      Recommendations for follow up therapy are one component of a multi-disciplinary discharge planning process, led by the attending physician.  Recommendations may be updated based on patient status, additional functional criteria and insurance authorization.   Follow Up Recommendations  No OT follow up    Assistance Recommended at Discharge None     Functional Status Assessment  Patient has had a recent decline in their functional status and demonstrates the ability to make significant improvements in function in a reasonable and predictable amount of time. (without follow up OT needed)  Equipment Recommendations  None recommended by OT       Precautions / Restrictions Precautions Precautions: None Restrictions Weight Bearing Restrictions: No      Mobility Bed Mobility Overal bed mobility: Independent                  Transfers Overall transfer level: Independent Equipment used: None                      Balance Overall balance assessment: Mild deficits observed, not formally tested                                         ADL either performed or assessed with clinical  judgement   ADL Overall ADL's : Independent                                             Vision Patient Visual Report: No change from baseline              Pertinent Vitals/Pain Pain Assessment Pain Assessment: No/denies pain     Hand Dominance Right   Extremity/Trunk Assessment Upper Extremity Assessment Upper Extremity Assessment: Overall WFL for tasks assessed           Communication Communication Communication: No difficulties   Cognition Arousal/Alertness: Awake/alert Behavior During Therapy: WFL for tasks assessed/performed Overall Cognitive Status: Within Functional Limits for tasks assessed                                       General Comments  Just feels week from being in bed for 3 days            Home Living Family/patient expects to be discharged to:: Private residence Living Arrangements: Spouse/significant other Available Help at Discharge: Family;Available 24 hours/day Type of Home: House Home Access: Stairs  to enter Entrance Stairs-Number of Steps: 2   Home Layout: Able to live on main level with bedroom/bathroom;Two level     Bathroom Shower/Tub: Walk-in shower;Door   [employer]: Standard     Home Equipment: Shower seat - built in;Shower seat;BSC/3in1          Prior Functioning/Environment Prior Level of Function : Independent/Modified Independent                         AM-PAC OT "6 Clicks" Daily Activity     Outcome Measure Help from another person eating meals?: None Help from another person taking care of personal grooming?: None Help from another person toileting, which includes using toliet, bedpan, or urinal?: None Help from another person bathing (including washing, rinsing, drying)?: None Help from another person to put on and taking off regular upper body clothing?: None Help from another person to put on and taking off regular lower body clothing?: None 6 Click  Score: 24   End of Session Equipment Utilized During Treatment: Gait belt  Activity Tolerance: Patient tolerated treatment well Patient left:  (with PT getting ready to ambulate)  OT Visit Diagnosis: Unsteadiness on feet (R26.81)                Time: 4709-2957 OT Time Calculation (min): 14 min Charges:  OT General Charges $OT Visit: 1 Visit OT Evaluation $OT Eval Moderate Complexity: 1 Mod  Golden Circle, OTR/L Acute [employer] Pager [phone] Office [phone]    Almon Register 06/19/2021, 1:53 PM

## 2021-06-19 NOTE — Progress Notes (Signed)
PROGRESS NOTE  Nicole Campbell KGY:[id number] DOB: 03/16/1946 DOA: 06/16/2021 PCP: [name], MD  HPI/Recap of past 24 hours: Nicole Campbell is a 76 y.o. 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 Alen Blew).  She presented to [facility] ED 2/16 with flu like symptoms x 5 days including sore throat, chills, congestion, myalgias, fevers, decreased appetite. She had called her PCP who prescribed Amoxicillin and Doxycycline for possible sinus infection; however, had no relief. Per her husband, she began to get weaker 2 days prior to ED presentation.  Due to refractory hypotension, PCCM was called for transfer to [facility] for further management.  Prior to transfer, BP improved and pt was not started on vasopressors.  Due to concern for chronic sinusitis CT maxillofacial was obtained and confirmed sinusitis.  ENT curbsided on 06/18/2021, Dr. Constance Holster, reviewed the CT scan, mild chronic sinusitis, he will see the patient outpatient.  Curb sided with medical oncology Dr. Marin Olp on 2/19.  Recommended to resume home CLL medication, Ibrutinib, in 1 week.  06/19/2021: Patient was seen at her bedside.  She states that she feels better today.  No new complaints.  She will be evaluated by PT OT for DC planning, possibly tomorrow on 06/20/2021.  Assessment/Plan: Principal Problem:   Febrile neutropenia (HCC) Active Problems:   Neutropenic fever (HCC)  Neutropenic fever with underlying CLL - Unclear etiology at this point.   Lactic acid normal 1.9 from 2.7. UA negative for pyuria. Chest x-ray no acute airspace disease. Procalcitonin is downtrending 0.44 on 06/19/21 from 0.65 on 06/17/2021. - Continue empiric abx with Vanc, Cefepime. Blood cultures: 06/16/2021 showed Staphylococcus epidermidis, Staphylococcus hominis, thought to be contaminants -Continue to hold home Imbruvica, resume in 1 week, 06/26/2021.   Mild  chronic sinusitis, seen on CT scan Confirmed on maxillofacial CT scan done without contrast on 06/17/2021 ENT, Dr. Constance Holster, curb sided, will follow-up outpatient Continue IV antibiotics for now, will complete tomorrow to 2023.  Resolving sepsis secondary to sinusitis; shock resolved. Normal cortisol (not drawn at 8AM). -con't antibiotics   Bilateral pleural effusions Continue incentive spirometer, flutter valve Home oxygen evaluation on 06/19/2021 for DC planning   Resolved hyponatremia with AKI - suspect hypovolemia, improving with volume. Had poor PO intake PTA. Serum sodium 135, creatinine 0.70 with GFR greater than 60. Continue to hold off home HCTZ  Refractory hypokalemia Serum potassium 3.4 Repleted orally. Magnesium 2.3.  Refractory hypophosphatemia Serum phosphorus 1.8 from 1.7. Repleted intravenously.  HTN, mitral valve prolapse. - Hold home Toprol-XL, spironolactone- HCTZ   Hypothyroidism; TSH WNL this admission -con't synthroid   RA (not on any meds). - No interventions required.  Positive blood culture, Staphylococcus epidermidis, Staphylococcus hominis from 06/16/2021. Suspect contaminant         Best practice (evaluated daily):  Diet/type: Regular consistency (see orders) DVT prophylaxis: prophylactic heparin SQ 3 times daily GI prophylaxis: N/A Lines: Central line Foley:  N/A Code Status:  full code Last date of multidisciplinary goals of care discussion: None yet. Consult: ENT, PCCM.   Labs   CBC: Last Labs       Recent Labs  Lab 06/16/21 1928 06/17/21 0423  WBC 3.1* 3.0*  NEUTROABS 0.1*  --   HGB 11.7* 10.0*  HCT 35.2* 30.4*  MCV 85.9 87.4  PLT 212 164           Status is: Inpatient Patient requires at  least 2 midnights for further evaluation and treatment of present condition.            Objective: Vitals:   06/18/21 1948 06/19/21 0008 06/19/21 0425 06/19/21 0731  BP: (!) 100/43 (!) 117/53 (!) 113/54 (!) 106/54   Pulse: 89 81 79 86  Resp: 16 14 15 18   Temp: 99 F (37.2 C) 98.2 F (36.8 C) 98.8 F (37.1 C) 97.8 F (36.6 C)  TempSrc: Oral Oral Oral Oral  SpO2: 96% 98% 96% 97%  Weight:      Height:        Intake/Output Summary (Last 24 hours) at 06/19/2021 1312 Last data filed at 06/19/2021 1043 Gross per 24 hour  Intake 854.58 ml  Output 1600 ml  Net -745.42 ml   Filed Weights   06/17/21 0055 06/17/21 0147 06/18/21 0355  Weight: 71.9 kg 71.9 kg 71.2 kg    Exam:  General: 76 y.o. year-old female well-developed well-nourished in no acute distress.  She is alert oriented x3.   Cardiovascular: Regular rate and rhythm no rubs or gallops. Respiratory: Clear to auscultation no wheezes or rales.   Abdomen: Soft nontender normal bowel sounds present.   Musculoskeletal: No lower extremity edema. 2/4 pulses in all 4 extremities. Skin: No ulcerative lesions or rashes. Psychiatry: Mood is appropriate for condition.   Data Reviewed: CBC: Recent Labs  Lab 06/16/21 1928 06/17/21 0423 06/18/21 0202 06/19/21 0043  WBC 3.1* 2.8*   3.0* 3.2* 5.4  NEUTROABS 0.1* 0.1* 0.3* 1.3*  HGB 11.7* 9.9*   10.0* 8.6* 9.9*  HCT 35.2* 31.1*   30.4* 27.0* 30.1*  MCV 85.9 88.6   87.4 87.9 86.7  PLT 212 186   164 159 846   Basic Metabolic Panel: Recent Labs  Lab 06/16/21 1928 06/17/21 0423 06/17/21 1959 06/18/21 0202 06/19/21 0043  NA 125* 131* 133* 136 135  K 3.6 3.3* 3.8 3.5 3.4*  CL 90* 101 104 105 104  CO2 24 22 21* 23 23  GLUCOSE 115* 111* 170* 115* 113*  BUN 19 13 14 12 8   CREATININE 1.15* 0.93 0.85 0.81 0.70  CALCIUM 8.1* 7.4* 8.0* 8.2* 8.3*  MG  --  2.0  --  2.2 2.3  PHOS  --  2.8  --  1.7* 1.8*   GFR: Estimated Creatinine Clearance: 62.4 mL/min (by C-G formula based on SCr of 0.7 mg/dL). Liver Function Tests: Recent Labs  Lab 06/17/21 1037  AST 20  ALT 30  ALKPHOS 73  BILITOT 1.0  PROT 4.7*  ALBUMIN 2.4*   No results for input(s): LIPASE, AMYLASE in the last 168 hours. No  results for input(s): AMMONIA in the last 168 hours. Coagulation Profile: No results for input(s): INR, PROTIME in the last 168 hours. Cardiac Enzymes: No results for input(s): CKTOTAL, CKMB, CKMBINDEX, TROPONINI in the last 168 hours. BNP (last 3 results) Recent Labs    07/01/20 1529  PROBNP 18.0   HbA1C: No results for input(s): HGBA1C in the last 72 hours. CBG: Recent Labs  Lab 06/17/21 0815 06/17/21 1200 06/17/21 2113 06/18/21 0800 06/18/21 1523  GLUCAP 161* 114* 120* 126* 141*   Lipid Profile: No results for input(s): CHOL, HDL, LDLCALC, TRIG, CHOLHDL, LDLDIRECT in the last 72 hours. Thyroid Function Tests: Recent Labs    06/17/21 0112  TSH 2.281   Anemia Panel: No results for input(s): VITAMINB12, FOLATE, FERRITIN, TIBC, IRON, RETICCTPCT in the last 72 hours. Urine analysis:    Component Value Date/Time   COLORURINE YELLOW  06/17/2021 0207   APPEARANCEUR CLEAR 06/17/2021 0207   LABSPEC 1.005 06/17/2021 0207   PHURINE 6.0 06/17/2021 0207   GLUCOSEU NEGATIVE 06/17/2021 0207   HGBUR MODERATE (A) 06/17/2021 0207   BILIRUBINUR NEGATIVE 06/17/2021 0207   KETONESUR NEGATIVE 06/17/2021 0207   PROTEINUR NEGATIVE 06/17/2021 0207   NITRITE NEGATIVE 06/17/2021 0207   LEUKOCYTESUR NEGATIVE 06/17/2021 0207   Sepsis Labs: @LABRCNTIP (procalcitonin:4,lacticidven:4)  ) Recent Results (from the past 240 hour(s))  Culture, blood (routine x 2)     Status: Abnormal (Preliminary result)   Collection Time: 06/16/21  7:25 PM   Specimen: Right Antecubital; Blood  Result Value Ref Range Status   Specimen Description   Final    RIGHT ANTECUBITAL Performed at [facility], [address] [place], [place] [address]    Special Requests   Final    BOTTLES DRAWN AEROBIC AND ANAEROBIC Blood Culture adequate volume Performed at [facility], [address] [place], [place] [address]    Culture  Setup Time   Final    GRAM POSITIVE COCCI IN BOTH AEROBIC AND  ANAEROBIC BOTTLES CRITICAL RESULT CALLED TO, READ BACK BY AND VERIFIED WITH: PHARMD L. POWELL [id number] @2251  FH     Culture (A)  Final    STAPHYLOCOCCUS EPIDERMIDIS STAPHYLOCOCCUS HOMINIS THE SIGNIFICANCE OF ISOLATING THIS ORGANISM FROM A SINGLE SET OF BLOOD CULTURES WHEN MULTIPLE SETS ARE DRAWN IS UNCERTAIN. PLEASE NOTIFY THE MICROBIOLOGY DEPARTMENT WITHIN ONE WEEK IF SPECIATION AND SENSITIVITIES ARE REQUIRED. Performed at [facility], [place] [address] [place], [place] [address]    Report Status PENDING  Incomplete  Blood Culture ID Panel (Reflexed)     Status: Abnormal   Collection Time: 06/16/21  7:25 PM  Result Value Ref Range Status   Enterococcus faecalis NOT DETECTED NOT DETECTED Final   Enterococcus Faecium NOT DETECTED NOT DETECTED Final   Listeria monocytogenes NOT DETECTED NOT DETECTED Final   Staphylococcus species DETECTED (A) NOT DETECTED Final    Comment: RBV PHARMD L. POWELL [id number] @2252  FH    Staphylococcus aureus (BCID) NOT DETECTED NOT DETECTED Final   Staphylococcus epidermidis DETECTED (A) NOT DETECTED Final    Comment: Methicillin (oxacillin) resistant coagulase negative staphylococcus. Possible blood culture contaminant (unless isolated from more than one blood culture draw or clinical case suggests pathogenicity). No antibiotic treatment is indicated for blood  culture contaminants. CRITICAL RESULT CALLED TO, READ BACK BY AND VERIFIED WITH: PHARMD L. POWELL [id number] @2252  FH    Staphylococcus lugdunensis NOT DETECTED NOT DETECTED Final   Streptococcus species NOT DETECTED NOT DETECTED Final   Streptococcus agalactiae NOT DETECTED NOT DETECTED Final   Streptococcus pneumoniae NOT DETECTED NOT DETECTED Final   Streptococcus pyogenes NOT DETECTED NOT DETECTED Final   A.calcoaceticus-baumannii NOT DETECTED NOT DETECTED Final   Bacteroides fragilis NOT DETECTED NOT DETECTED Final   Enterobacterales NOT DETECTED NOT DETECTED Final   Enterobacter cloacae complex  NOT DETECTED NOT DETECTED Final   Escherichia coli NOT DETECTED NOT DETECTED Final   Klebsiella aerogenes NOT DETECTED NOT DETECTED Final   Klebsiella oxytoca NOT DETECTED NOT DETECTED Final   Klebsiella pneumoniae NOT DETECTED NOT DETECTED Final   Proteus species NOT DETECTED NOT DETECTED Final   Salmonella species NOT DETECTED NOT DETECTED Final   Serratia marcescens NOT DETECTED NOT DETECTED Final   Haemophilus influenzae NOT DETECTED NOT DETECTED Final   Neisseria meningitidis NOT DETECTED NOT DETECTED Final   Pseudomonas aeruginosa NOT DETECTED NOT DETECTED Final  Stenotrophomonas maltophilia NOT DETECTED NOT DETECTED Final   Candida albicans NOT DETECTED NOT DETECTED Final   Candida auris NOT DETECTED NOT DETECTED Final   Candida glabrata NOT DETECTED NOT DETECTED Final   Candida krusei NOT DETECTED NOT DETECTED Final   Candida parapsilosis NOT DETECTED NOT DETECTED Final   Candida tropicalis NOT DETECTED NOT DETECTED Final   Cryptococcus neoformans/gattii NOT DETECTED NOT DETECTED Final   Methicillin resistance mecA/C DETECTED (A) NOT DETECTED Final    Comment: CRITICAL RESULT CALLED TO, READ BACK BY AND VERIFIED WITH: PHARMD LFlorene Glen [id number] @2252  FH Performed at [facility] [address], [place],  [address]   Group A Strep by PCR     Status: None   Collection Time: 06/16/21  7:28 PM   Specimen: Throat; Sterile Swab  Result Value Ref Range Status   Group A Strep by PCR NOT DETECTED NOT DETECTED Final    Comment: Performed at [facility], [address] [place], [place] [address]  Resp Panel by RT-PCR (Flu A&B, Covid) Nasopharyngeal Swab     Status: None   Collection Time: 06/16/21  7:28 PM   Specimen: Nasopharyngeal Swab; Nasopharyngeal(NP) swabs in vial transport medium  Result Value Ref Range Status   SARS Coronavirus 2 by RT PCR NEGATIVE NEGATIVE Final    Comment: (NOTE) SARS-CoV-2 target nucleic acids are NOT DETECTED.  The SARS-CoV-2  RNA is generally detectable in upper respiratory specimens during the acute phase of infection. The lowest concentration of SARS-CoV-2 viral copies this assay can detect is 138 copies/mL. A negative result does not preclude SARS-Cov-2 infection and should not be used as the sole basis for treatment or other patient management decisions. A negative result may occur with  improper specimen collection/handling, submission of specimen other than nasopharyngeal swab, presence of viral mutation(s) within the areas targeted by this assay, and inadequate number of viral copies(<138 copies/mL). A negative result must be combined with clinical observations, patient history, and epidemiological information. The expected result is Negative.  Fact Sheet for Patients:  EntrepreneurPulse.com.au  Fact Sheet for Healthcare Providers:  IncredibleEmployment.be  This test is no t yet approved or cleared by the [place] FDA and  has been authorized for detection and/or diagnosis of SARS-CoV-2 by FDA under an Emergency Use Authorization (EUA). This EUA will remain  in effect (meaning this test can be used) for the duration of the COVID-19 declaration under Section 564(b)(1) of the Act, 21 U.S.C.section 360bbb-3(b)(1), unless the authorization is terminated  or revoked sooner.       Influenza A by PCR NEGATIVE NEGATIVE Final   Influenza B by PCR NEGATIVE NEGATIVE Final    Comment: (NOTE) The Xpert Xpress SARS-CoV-2/FLU/RSV plus assay is intended as an aid in the diagnosis of influenza from Nasopharyngeal swab specimens and should not be used as a sole basis for treatment. Nasal washings and aspirates are unacceptable for Xpert Xpress SARS-CoV-2/FLU/RSV testing.  Fact Sheet for Patients: EntrepreneurPulse.com.au  Fact Sheet for Healthcare Providers: IncredibleEmployment.be  This test is not yet approved or cleared by the  [place] FDA and has been authorized for detection and/or diagnosis of SARS-CoV-2 by FDA under an Emergency Use Authorization (EUA). This EUA will remain in effect (meaning this test can be used) for the duration of the COVID-19 declaration under Section 564(b)(1) of the Act, 21 U.S.C. section 360bbb-3(b)(1), unless the authorization is terminated or revoked.  Performed at [facility], [address] [place], [place] [address]   Culture, blood (routine x 2)     Status: None (Preliminary result)   Collection Time: 06/16/21  7:30 PM   Specimen: BLOOD LEFT FOREARM  Result Value Ref Range Status   Specimen Description   Final    BLOOD LEFT FOREARM Performed at [facility], [address] [place], [place] [address]    Special Requests   Final    BOTTLES DRAWN AEROBIC AND ANAEROBIC Blood Culture adequate volume Performed at [facility], [address] [place], [place] [address]    Culture   Final    NO GROWTH 3 DAYS Performed at [facility], [address], [place], [place] [address]    Report Status PENDING  Incomplete  MRSA Next Gen by PCR, Nasal     Status: None   Collection Time: 06/17/21  2:07 AM   Specimen: Nasal Mucosa; Nasal Swab  Result Value Ref Range Status   MRSA by PCR Next Gen NOT DETECTED NOT DETECTED Final    Comment: (NOTE) The GeneXpert MRSA Assay (FDA approved for NASAL specimens only), is one component of a comprehensive MRSA colonization surveillance program. It is not intended to diagnose MRSA infection nor to guide or monitor treatment for MRSA infections. Test performance is not FDA approved in patients less than 80 years old. Performed at [facility], [place] [address] [place], [place] [address]   Respiratory (~20 pathogens) panel by PCR     Status: None   Collection Time: 06/17/21 10:48 AM   Specimen: Nasopharyngeal Swab; Respiratory  Result Value Ref Range Status   Adenovirus NOT DETECTED NOT  DETECTED Final   Coronavirus 229E NOT DETECTED NOT DETECTED Final    Comment: (NOTE) The Coronavirus on the Respiratory Panel, DOES NOT test for the novel  Coronavirus (2019 nCoV)    Coronavirus HKU1 NOT DETECTED NOT DETECTED Final   Coronavirus NL63 NOT DETECTED NOT DETECTED Final   Coronavirus OC43 NOT DETECTED NOT DETECTED Final   Metapneumovirus NOT DETECTED NOT DETECTED Final   Rhinovirus / Enterovirus NOT DETECTED NOT DETECTED Final   Influenza A NOT DETECTED NOT DETECTED Final   Influenza B NOT DETECTED NOT DETECTED Final   Parainfluenza Virus 1 NOT DETECTED NOT DETECTED Final   Parainfluenza Virus 2 NOT DETECTED NOT DETECTED Final   Parainfluenza Virus 3 NOT DETECTED NOT DETECTED Final   Parainfluenza Virus 4 NOT DETECTED NOT DETECTED Final   Respiratory Syncytial Virus NOT DETECTED NOT DETECTED Final   Bordetella pertussis NOT DETECTED NOT DETECTED Final   Bordetella Parapertussis NOT DETECTED NOT DETECTED Final   Chlamydophila pneumoniae NOT DETECTED NOT DETECTED Final   Mycoplasma pneumoniae NOT DETECTED NOT DETECTED Final    Comment: Performed at [facility], [place]. [address], [place], [place] [address]      Studies: No results found.  Scheduled Meds:  Chlorhexidine Gluconate Cloth  6 each Topical Daily   escitalopram  5 mg Oral Daily   feeding supplement  237 mL Oral BID BM   heparin  5,000 Units Subcutaneous Q8H   levothyroxine  25 mcg Oral Q0600   multivitamin with minerals  1 tablet Oral Daily   sodium chloride flush  10-40 mL Intracatheter Q12H    Continuous Infusions:  ceFEPime (MAXIPIME) IV 2 g (06/19/21 0946)   potassium PHOSPHATE IVPB (in mmol)     vancomycin 1,250 mg (06/19/21 1103)     LOS: 2 days  Kayleen Memos, MD [facility] Pager [phone]  If 7PM-7AM, please contact night-coverage [email or web address] Password [email or web address] 06/19/2021, 1:12 PM

## 2021-06-20 LAB — BASIC METABOLIC PANEL
Anion gap: 6 (ref 5–15)
BUN: 8 mg/dL (ref 8–23)
CO2: 24 mmol/L (ref 22–32)
Calcium: 8 mg/dL — ABNORMAL LOW (ref 8.9–10.3)
Chloride: 105 mmol/L (ref 98–111)
Creatinine, Ser: 0.71 mg/dL (ref 0.44–1.00)
GFR, Estimated: >60 mL/min (ref >60)
Glucose, Bld: 104 mg/dL — ABNORMAL HIGH (ref 70–99)
Potassium: 3.8 mmol/L (ref 3.5–5.1)
Sodium: 135 mmol/L (ref 135–145)

## 2021-06-20 LAB — CBC WITH DIFFERENTIAL/PLATELET
Abs Immature Granulocytes: 0.86 10*3/uL — ABNORMAL HIGH (ref 0.00–0.07)
Basophils Absolute: 0.1 10*3/uL (ref 0.0–0.1)
Basophils Relative: 2 %
Eosinophils Absolute: 0.1 10*3/uL (ref 0.0–0.5)
Eosinophils Relative: 2 %
HCT: 29 % — ABNORMAL LOW (ref 36.0–46.0)
Hemoglobin: 9.5 g/dL — ABNORMAL LOW (ref 12.0–15.0)
Immature Granulocytes: 11 %
Lymphocytes Relative: 24 %
Lymphs Abs: 1.9 10*3/uL (ref 0.7–4.0)
MCH: 28.5 pg (ref 26.0–34.0)
MCHC: 32.8 g/dL (ref 30.0–36.0)
MCV: 87.1 fL (ref 80.0–100.0)
Monocytes Absolute: 2.3 10*3/uL — ABNORMAL HIGH (ref 0.1–1.0)
Monocytes Relative: 29 %
Neutro Abs: 2.5 10*3/uL (ref 1.7–7.7)
Neutrophils Relative %: 32 %
Platelets: 210 10*3/uL (ref 150–400)
RBC: 3.33 MIL/uL — ABNORMAL LOW (ref 3.87–5.11)
RDW: 14.8 % (ref 11.5–15.5)
WBC: 7.7 10*3/uL (ref 4.0–10.5)
nRBC: 0 % (ref 0.0–0.2)

## 2021-06-20 LAB — CULTURE, BLOOD (ROUTINE X 2): Special Requests: ADEQUATE

## 2021-06-20 LAB — PHOSPHORUS: Phosphorus: 2.9 mg/dL (ref 2.5–4.6)

## 2021-06-20 LAB — MAGNESIUM: Magnesium: 2.2 mg/dL (ref 1.7–2.4)

## 2021-06-20 MED ORDER — ENSURE ENLIVE PO LIQD: 237.0000 mL | Freq: Two times a day (BID) | ORAL | 0 refills | Status: AC

## 2021-06-20 MED ORDER — ADULT MULTIVITAMIN W/MINERALS CH: 1.0000 | ORAL_TABLET | Freq: Every day | ORAL | 0 refills | Status: AC

## 2021-06-20 MED ORDER — POTASSIUM CHLORIDE CRYS ER 10 MEQ PO TBCR: 10.0000 meq | EXTENDED_RELEASE_TABLET | Freq: Every day | ORAL | 0 refills | Status: DC | PRN

## 2021-06-20 MED ORDER — FUROSEMIDE 20 MG PO TABS: 20.0000 mg | ORAL_TABLET | Freq: Every day | ORAL | 11 refills | Status: DC | PRN

## 2021-06-20 MED ORDER — IBRUTINIB 420 MG PO TABS: ORAL_TABLET | ORAL | 0 refills | Status: AC

## 2021-06-20 MED ORDER — POLYETHYLENE GLYCOL 3350 17 G PO PACK: 17.0000 g | PACK | Freq: Every day | ORAL | 0 refills | Status: DC | PRN

## 2021-06-20 NOTE — Discharge Summary (Signed)
Discharge Summary  Nicole Campbell BSW:[id number] DOB: 02-14-1946  PCP: [name], MD  Admit date: 06/16/2021 Discharge date: 06/20/2021  Time spent: 35 minutes   Recommendations for Outpatient Follow-up:  Follow up with hematology/oncology, Dr. Alen Blew, within a week. Follow-up with your cardiologist in 1 to 2 weeks. Follow up with your PCP in 1-2 weeks. Take your medications as prescribed.  Continue to hold off Ibrutinib until you see Dr. Alen Blew.  Discharge Diagnoses:  Active Hospital Problems   Diagnosis Date Noted   Febrile neutropenia ([place]) 06/16/2021   Neutropenic fever ([place]) 06/16/2021    Resolved Hospital Problems  No resolved problems to display.    Discharge Condition: Stable   Diet recommendation: Resume previous diet   Vitals:   06/20/21 0419 06/20/21 0728  BP: (!) 118/58 (!) 111/56  Pulse: 75 82  Resp: 15 17  Temp: 98.3 F (36.8 C) 99 F (37.2 C)  SpO2: 99% 97%    History of present illness:  Nicole Campbell is a 76 y.o. 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 Alen Blew) who presented to [facility] ED 06/16/21 with flu like symptoms of 5 days duration which included sore throat, chills, nasal congestion, myalgias, fevers, and poor oral intake with decreased appetite.  She had called her PCP who prescribed Amoxicillin and Doxycycline for possible sinus infection; however, had no relief.  She began to get weaker 2 days prior to her presentation to the ED.  Due to refractory hypotension, PCCM was called for direct transfer to [facility] for further management.  Prior to transfer, BP improved and pt did not require vasopressors.    She was admitted for neutropenic fever and severe sepsis thought secondary to sinusitis with suspicion for community-acquired pneumonia.  She had a chest x-ray done which showed bilateral pleural effusions improved from prior chest x-ray.  No clear evidence of  infiltrates to suggest pneumonia.  Procalcitonin was elevated greater than 0.6.  Peripheral blood cultures were obtained and she was started on broad-spectrum empiric IV antibiotics, cefepime and IV vancomycin.   CT maxillofacial confirmed sinusitis.  ENT curbsided on 06/18/2021, Dr. Constance Holster, reviewed the CT scan, felt chronic sinusitis appeared mild on CT scan.  He will see the patient in the clinic outpatient.   Curb sided with medical oncology Dr. Marin Olp on 06/19/21.  Recommended to resume home CLL medication, Ibrutinib, in 1 week.    Discussed with Dr. Alen Blew, via phone on 06/20/21, recommended to hold off on restarting ibrutinib until he sees the patient in the office.  CBC will need to be repeated at that time.  Informed the patient and her husband at bedside.  They both understand and agree with the plan.   06/20/2021: Seen and examined at bedside.  No acute events overnight.  She states she feels a lot better.  She has no new complaints.  She is eager to go home.  Hospital Course:  Principal Problem:   Febrile neutropenia (HCC) Active Problems:   Neutropenic fever (HCC)  Severe sepsis secondary to sinusitis with suspicion for early community-acquired pneumonia/neutropenic fever with underlying CLL - UA negative for pyuria. Chest x-ray no acute airspace disease but concern for the possibility of early community-acquired pneumonia particularly on the right lower lobe. Procalcitonin is downtrending 0.44 on 06/19/21 from 0.65 on 06/17/2021. Received empiric abx with IV vancomycin, Cefepime. Blood cultures: 06/16/2021 showed Staphylococcus epidermidis, Staphylococcus hominis, thought to be contaminants. Held  Home Imbruvica.  Hematology oncology Dr. Alen Blew recommended to hold off until he sees the patient in the office.   Mild chronic sinusitis, seen on CT scan Confirmed on maxillofacial CT scan done without contrast on 06/17/2021 ENT, Dr. Constance Holster, curb sided, will follow-up outpatient Received 5  days of IV antibiotics.  Mitral valve prolapse Patient on metoprolol, held due to soft BPs to avoid hypotension. Advised to follow-up with her cardiologist in 1 to 2 weeks.  Patient understands and agrees with the plan. BP prior to discharge 111/56, pulse 82, respiratory 17, O2 saturation 99% on room air.   Bilateral pleural effusions, improved from prior chest x-ray. Continue incentive spirometer, flutter valve Oxygen saturation 99% on room air.   Resolved hyponatremia with prerenal AKI - suspect multifactorial hypovolemia and side effect from HCTZ.   Had poor PO intake prior to admission.  Also was on HCTZ prior to admission. The day of discharge, serum sodium 135, creatinine 0.71 with GFR greater than 60.   Resolved post repletion: Refractory hypokalemia Serum potassium 3.8 from 3.3, with serum magnesium of 2.2.   Resolved post repletion: Refractory hypophosphatemia Serum phosphorus 2.9 from 1.7.   Hypothyroidism;  TSH WNL this admission -con't home synthroid   Positive blood culture, Staphylococcus epidermidis, Staphylococcus hominis from 06/16/2021. Thought to be contaminant Received IV cefepime and IV vancomycin for neutropenic fever.  Chronic diastolic CHF Chronic bilateral lower extremity edema Hold metoprolol, HCTZ, spironolactone held due to soft BPs. Added p.o. Lasix 20 mg daily as needed for lower extremity edema along with p.o. KCl 10 mEq daily while taking Lasix to avoid hypokalemia. Follow-up with your cardiologist in 1 to 2 weeks.    Procedures: 2D echo 06/17/2021.  Consultations: PCCM  ENT  Discharge Exam: BP (!) 111/56 (BP Location: Left Arm)    Pulse 82    Temp 99 F (37.2 C) (Oral)    Resp 17    Ht 5\' 6"  (1.676 m)    Wt 71.2 kg    SpO2 97%    BMI 25.34 kg/m  General: 76 y.o. year-old female well developed well nourished in no acute distress.  Alert and oriented x3. Cardiovascular: Regular rate and rhythm with no rubs or gallops.  No thyromegaly or JVD  noted.   Respiratory: Clear to auscultation with no wheezes or rales. Good inspiratory effort. Abdomen: Soft nontender nondistended with normal bowel sounds x4 quadrants. Musculoskeletal: Trace edema in lower extremities bilaterally. Skin: No ulcerative lesions noted or rashes. Psychiatry: Mood is appropriate for condition and setting  Discharge Instructions You were cared for by a hospitalist during your hospital stay. If you have any questions about your discharge medications or the care you received while you were in the hospital after you are discharged, you can call the unit and asked to speak with the hospitalist on call if the hospitalist that took care of you is not available. Once you are discharged, your primary care physician will handle any further medical issues. Please note that NO REFILLS for any discharge medications will be authorized once you are discharged, as it is imperative that you return to your primary care physician (or establish a relationship with a primary care physician if you do not have one) for your aftercare needs so that they can reassess your need for medications and monitor your lab values.   Allergies as of 06/20/2021       Reactions   Methotrexate Other (See Comments)   Other reaction(s): horrible lower back pain  Erythromycin Palpitations        Medication List     STOP taking these medications    metoprolol succinate 25 MG 24 hr tablet Commonly known as: Toprol XL   spironolactone-hydrochlorothiazide 25-25 MG tablet Commonly known as: ALDACTAZIDE       TAKE these medications    acetaminophen 650 MG CR tablet Commonly known as: TYLENOL Take 1,300 mg by mouth every 8 (eight) hours as needed for pain.   escitalopram 5 MG tablet Commonly known as: LEXAPRO Take 5 mg by mouth daily.   feeding supplement Liqd Take 237 mLs by mouth 2 (two) times daily between meals for 7 days.   furosemide 20 MG tablet Commonly known as: Lasix Take 1  tablet (20 mg total) by mouth daily as needed.   ibrutinib 420 MG tablet Commonly known as: Imbruvica TAKE 1 TABLET (420 MG) BY MOUTH DAILY. Start taking on: June 27, 2021 What changed: These instructions start on June 27, 2021. If you are unsure what to do until then, ask your doctor or other care provider.   levothyroxine 25 MCG tablet Commonly known as: SYNTHROID Take 25 mcg by mouth daily before breakfast.   multivitamin with minerals Tabs tablet Take 1 tablet by mouth daily.   polyethylene glycol 17 g packet Commonly known as: MIRALAX / GLYCOLAX Take 17 g by mouth daily as needed for moderate constipation.   potassium chloride 10 MEQ tablet Commonly known as: KLOR-CON M Take 1 tablet (10 mEq total) by mouth daily as needed. Pease take along with lasix if needed for legs swelling.       Allergies  Allergen Reactions   Methotrexate Other (See Comments)    Other reaction(s): horrible lower back pain   Erythromycin Palpitations    Follow-up Information     Izora Gala, MD. Call today.   Specialty: Otolaryngology Why: Please call for a posthospital follow-up appointment. Contact information: [address] [place] [address] [phone]         [name], MD. Call today.   Specialty: Family Medicine Why: Please call for a posthospital follow-up appointment. Contact information: [place] [address] [phone]         Wyatt Portela, MD. Call today.   Specialty: Oncology Why: Please call for a posthospital follow-up appointment. Contact information: [place] [address] [phone]                  The results of significant diagnostics from this hospitalization (including imaging, microbiology, ancillary and laboratory) are listed below for reference.    Significant Diagnostic Studies: DG Chest 2 View  Result Date: 06/16/2021 CLINICAL DATA:  Flu  like illness for 5 days, lethargy EXAM: CHEST - 2 VIEW COMPARISON:  03/16/2021 FINDINGS: Frontal and lateral views of the chest demonstrate chronic bilateral pleural effusions, right greater than left, decreased in volume since prior study. No acute airspace disease or pneumothorax. No acute bony abnormalities. IMPRESSION: 1. Small bilateral pleural effusions, decreased since prior study. 2. No acute airspace disease. Electronically Signed   By: Randa Ngo M.D.   On: 06/16/2021 19:48   ECHOCARDIOGRAM LIMITED  Result Date: 06/17/2021    ECHOCARDIOGRAM LIMITED REPORT   Patient Name:   Nicole Campbell Date of Exam: 06/17/2021 Medical Rec #:  [id number]           Height:       66.0 in Accession #:  [id number]          Weight:       158.5 lb Date of Birth:  [birth year]           BSA:          1.812 m Patient Age:    44 years            BP:           103/49 mmHg Patient Gender: F                   HR:           88 bpm. Exam Location:  Inpatient Procedure: Cardiac Doppler, Strain Analysis, Limited Echo and Limited Color            Doppler Indications:    CHF  History:        Patient has prior history of Echocardiogram examinations, most                 recent 09/08/2020. Signs/Symptoms:Murmur and Dyspnea; Risk                 Factors:Hypertension.  Sonographer:    Luisa Hart RDCS Referring Phys: [place]  1. Left ventricular ejection fraction, by estimation, is 60 to 65%. The left ventricle has normal function. The left ventricle has no regional wall motion abnormalities. mild focal basal septal left ventricular hypertrophy. Left ventricular diastolic parameters were normal. The average left ventricular global longitudinal strain is -18.4 %. The global longitudinal strain is normal.  2. Right ventricular systolic function is normal. The right ventricular size is normal. There is normal pulmonary artery systolic pressure. The estimated right ventricular systolic pressure is 94.4 mmHg.   3. The mitral valve is normal in structure. Trivial mitral valve regurgitation. No evidence of mitral stenosis.  4. The aortic valve is tricuspid. Aortic valve regurgitation is not visualized. No aortic stenosis is present.  5. The inferior vena cava is normal in size with greater than 50% respiratory variability, suggesting right atrial pressure of 3 mmHg. FINDINGS  Left Ventricle: Left ventricular ejection fraction, by estimation, is 60 to 65%. The left ventricle has normal function. The left ventricle has no regional wall motion abnormalities. The average left ventricular global longitudinal strain is -18.4 %. The global longitudinal strain is normal. Mild focal basal septal left ventricular hypertrophy. Left ventricular diastolic parameters were normal. Right Ventricle: The right ventricular size is normal. No increase in right ventricular wall thickness. Right ventricular systolic function is normal. There is normal pulmonary artery systolic pressure. The tricuspid regurgitant velocity is 2.70 m/s, and  with an assumed right atrial pressure of 3 mmHg, the estimated right ventricular systolic pressure is 96.7 mmHg. Left Atrium: Left atrial size was normal in size. Right Atrium: Right atrial size was normal in size. Pericardium: There is no evidence of pericardial effusion. Mitral Valve: The mitral valve is normal in structure. Mild to moderate mitral annular calcification. Trivial mitral valve regurgitation. No evidence of mitral valve stenosis. MV peak gradient, 7.3 mmHg. The mean mitral valve gradient is 2.0 mmHg. Tricuspid Valve: The tricuspid valve is normal in structure. Tricuspid valve regurgitation is trivial. Aortic Valve: The aortic valve is tricuspid. Aortic valve regurgitation is not visualized. No aortic stenosis is present. Aortic valve mean gradient measures 7.0 mmHg. Aortic valve peak gradient measures 12.0 mmHg. Aortic valve area, by VTI measures 2.28  cm. Aorta: The aortic root is normal in  size  and structure. Venous: The inferior vena cava is normal in size with greater than 50% respiratory variability, suggesting right atrial pressure of 3 mmHg. IAS/Shunts: No atrial level shunt detected by color flow Doppler. LEFT VENTRICLE PLAX 2D LVIDd:         4.00 cm     Diastology LVIDs:         2.60 cm     LV e' medial:    11.60 cm/s LV PW:         1.00 cm     LV E/e' medial:  10.3 LV IVS:        1.00 cm     LV e' lateral:   12.00 cm/s LVOT diam:     1.90 cm     LV E/e' lateral: 9.9 LV SV:         76 LV SV Index:   42          2D Longitudinal Strain LVOT Area:     2.84 cm    2D Strain GLS Avg:     -18.4 %  LV Volumes (MOD) LV vol d, MOD A2C: 54.7 ml LV vol d, MOD A4C: 58.2 ml LV vol s, MOD A2C: 20.6 ml LV vol s, MOD A4C: 21.3 ml LV SV MOD A2C:     34.1 ml LV SV MOD A4C:     58.2 ml LV SV MOD BP:      35.8 ml RIGHT VENTRICLE RV Basal diam:  2.80 cm RV Mid diam:    2.10 cm RV S prime:     16.00 cm/s TAPSE (M-mode): 3.3 cm LEFT ATRIUM             Index        RIGHT ATRIUM           Index LA Vol (A2C):   51.7 ml 28.54 ml/m  RA Area:     12.60 cm LA Vol (A4C):   35.0 ml 19.32 ml/m  RA Volume:   27.80 ml  15.35 ml/m LA Biplane Vol: 44.1 ml 24.34 ml/m  AORTIC VALVE                     PULMONIC VALVE AV Area (Vmax):    2.14 cm      PV Vmax:       0.86 m/s AV Area (Vmean):   2.23 cm      PV Vmean:      63.000 cm/s AV Area (VTI):     2.28 cm      PV VTI:        0.168 m AV Vmax:           173.50 cm/s   PV Peak grad:  3.0 mmHg AV Vmean:          122.000 cm/s  PV Mean grad:  2.0 mmHg AV VTI:            0.332 m AV Peak Grad:      12.0 mmHg AV Mean Grad:      7.0 mmHg LVOT Vmax:         131.00 cm/s LVOT Vmean:        95.850 cm/s LVOT VTI:          0.267 m LVOT/AV VTI ratio: 0.81  AORTA Ao Root diam: 2.80 cm Ao Asc diam:  2.80 cm MITRAL VALVE                TRICUSPID VALVE  MV Area (PHT): 3.85 cm     TR Peak grad:   29.2 mmHg MV Area VTI:   3.13 cm     TR Vmax:        270.00 cm/s MV Peak grad:  7.3 mmHg MV Mean  grad:  2.0 mmHg     SHUNTS MV Vmax:       1.35 m/s     Systemic VTI:  0.27 m MV Vmean:      68.4 cm/s    Systemic Diam: 1.90 cm MV Decel Time: 197 msec MR Peak grad: 49.7 mmHg MR Vmax:      352.50 cm/s MV E velocity: 119.00 cm/s MV A velocity: 90.70 cm/s MV E/A ratio:  1.31 Dalton McleanMD Electronically signed by Franki Monte Signature Date/Time: 06/17/2021/3:29:44 PM    Final    CT MAXILLOFACIAL WO CONTRAST  Result Date: 06/17/2021 CLINICAL DATA:  Maxillary/facial abscess EXAM: CT MAXILLOFACIAL WITHOUT CONTRAST TECHNIQUE: Multidetector CT imaging of the maxillofacial structures was performed. Multiplanar CT image reconstructions were also generated. RADIATION DOSE REDUCTION: This exam was performed according to the departmental dose-optimization program which includes automated exposure control, adjustment of the mA and/or kV according to patient size and/or use of iterative reconstruction technique. COMPARISON:  None. FINDINGS: Osseous: No fracture or mandibular dislocation. No destructive process. No periapical lucency. Orbits: Negative. No traumatic or inflammatory finding. Sinuses: Partial opacification of the ethmoid air cells. Mucosal thickening of the bilateral maxillary sinuses. No air-fluid levels. Soft tissues: Negative. Limited intracranial: No significant or unexpected finding. IMPRESSION: 1. No displaced fracture or dislocation of the facial bones. 2. No periapical lucency. No evidence of soft tissue abscess. 3. Partial opacification of the ethmoid air cells and mucosal thickening of the bilateral maxillary sinuses. No air-fluid levels. Correlate for sinusitis. Electronically Signed   By: Delanna Ahmadi M.D.   On: 06/17/2021 16:58    Microbiology: Recent Results (from the past 240 hour(s))  Culture, blood (routine x 2)     Status: Abnormal   Collection Time: 06/16/21  7:25 PM   Specimen: Right Antecubital; Blood  Result Value Ref Range Status   Specimen Description   Final    RIGHT  ANTECUBITAL Performed at [facility], [address] [place], [place] [address]    Special Requests   Final    BOTTLES DRAWN AEROBIC AND ANAEROBIC Blood Culture adequate volume Performed at [facility], [address] [place], [place] [address]    Culture  Setup Time   Final    GRAM POSITIVE COCCI IN BOTH AEROBIC AND ANAEROBIC BOTTLES CRITICAL RESULT CALLED TO, READ BACK BY AND VERIFIED WITH: PHARMD L. POWELL [id number] @2251  FH     Culture (A)  Final    STAPHYLOCOCCUS EPIDERMIDIS STAPHYLOCOCCUS HOMINIS THE SIGNIFICANCE OF ISOLATING THIS ORGANISM FROM A SINGLE SET OF BLOOD CULTURES WHEN MULTIPLE SETS ARE DRAWN IS UNCERTAIN. PLEASE NOTIFY THE MICROBIOLOGY DEPARTMENT WITHIN ONE WEEK IF SPECIATION AND SENSITIVITIES ARE REQUIRED. Performed at [facility], [place] [address], [place],  [address]    Report Status 06/20/2021 FINAL  Final  Blood Culture ID Panel (Reflexed)     Status: Abnormal   Collection Time: 06/16/21  7:25 PM  Result Value Ref Range Status   Enterococcus faecalis NOT DETECTED NOT DETECTED Final   Enterococcus Faecium NOT DETECTED NOT DETECTED Final   Listeria monocytogenes NOT DETECTED NOT DETECTED Final   Staphylococcus species DETECTED (A) NOT DETECTED Final    Comment: RBV  PHARMD L. POWELL [id number] @2252  FH    Staphylococcus aureus (BCID) NOT DETECTED NOT DETECTED Final   Staphylococcus epidermidis DETECTED (A) NOT DETECTED Final    Comment: Methicillin (oxacillin) resistant coagulase negative staphylococcus. Possible blood culture contaminant (unless isolated from more than one blood culture draw or clinical case suggests pathogenicity). No antibiotic treatment is indicated for blood  culture contaminants. CRITICAL RESULT CALLED TO, READ BACK BY AND VERIFIED WITH: PHARMD L. POWELL [id number] @2252  FH    Staphylococcus lugdunensis NOT DETECTED NOT DETECTED Final   Streptococcus species NOT DETECTED NOT DETECTED Final   Streptococcus  agalactiae NOT DETECTED NOT DETECTED Final   Streptococcus pneumoniae NOT DETECTED NOT DETECTED Final   Streptococcus pyogenes NOT DETECTED NOT DETECTED Final   A.calcoaceticus-baumannii NOT DETECTED NOT DETECTED Final   Bacteroides fragilis NOT DETECTED NOT DETECTED Final   Enterobacterales NOT DETECTED NOT DETECTED Final   Enterobacter cloacae complex NOT DETECTED NOT DETECTED Final   Escherichia coli NOT DETECTED NOT DETECTED Final   Klebsiella aerogenes NOT DETECTED NOT DETECTED Final   Klebsiella oxytoca NOT DETECTED NOT DETECTED Final   Klebsiella pneumoniae NOT DETECTED NOT DETECTED Final   Proteus species NOT DETECTED NOT DETECTED Final   Salmonella species NOT DETECTED NOT DETECTED Final   Serratia marcescens NOT DETECTED NOT DETECTED Final   Haemophilus influenzae NOT DETECTED NOT DETECTED Final   Neisseria meningitidis NOT DETECTED NOT DETECTED Final   Pseudomonas aeruginosa NOT DETECTED NOT DETECTED Final   Stenotrophomonas maltophilia NOT DETECTED NOT DETECTED Final   Candida albicans NOT DETECTED NOT DETECTED Final   Candida auris NOT DETECTED NOT DETECTED Final   Candida glabrata NOT DETECTED NOT DETECTED Final   Candida krusei NOT DETECTED NOT DETECTED Final   Candida parapsilosis NOT DETECTED NOT DETECTED Final   Candida tropicalis NOT DETECTED NOT DETECTED Final   Cryptococcus neoformans/gattii NOT DETECTED NOT DETECTED Final   Methicillin resistance mecA/C DETECTED (A) NOT DETECTED Final    Comment: CRITICAL RESULT CALLED TO, READ BACK BY AND VERIFIED WITH: PHARMD LFlorene Glen [id number] @2252  FH Performed at [facility], [address] [place], [place] [address]   Group A Strep by PCR     Status: None   Collection Time: 06/16/21  7:28 PM   Specimen: Throat; Sterile Swab  Result Value Ref Range Status   Group A Strep by PCR NOT DETECTED NOT DETECTED Final    Comment: Performed at [facility], [address] [place], [place] [address]  Resp Panel by  RT-PCR (Flu A&B, Covid) Nasopharyngeal Swab     Status: None   Collection Time: 06/16/21  7:28 PM   Specimen: Nasopharyngeal Swab; Nasopharyngeal(NP) swabs in vial transport medium  Result Value Ref Range Status   SARS Coronavirus 2 by RT PCR NEGATIVE NEGATIVE Final    Comment: (NOTE) SARS-CoV-2 target nucleic acids are NOT DETECTED.  The SARS-CoV-2 RNA is generally detectable in upper respiratory specimens during the acute phase of infection. The lowest concentration of SARS-CoV-2 viral copies this assay can detect is 138 copies/mL. A negative result does not preclude SARS-Cov-2 infection and should not be used as the sole basis for treatment or other patient management decisions. A negative result may occur with  improper specimen collection/handling, submission of specimen other than nasopharyngeal swab, presence of viral mutation(s) within the areas targeted by this assay, and inadequate number of viral copies(<138 copies/mL). A negative result must be combined with clinical observations, patient history, and epidemiological information. The  expected result is Negative.  Fact Sheet for Patients:  EntrepreneurPulse.com.au  Fact Sheet for Healthcare Providers:  IncredibleEmployment.be  This test is no t yet approved or cleared by the [place] FDA and  has been authorized for detection and/or diagnosis of SARS-CoV-2 by FDA under an Emergency Use Authorization (EUA). This EUA will remain  in effect (meaning this test can be used) for the duration of the COVID-19 declaration under Section 564(b)(1) of the Act, 21 U.S.C.section 360bbb-3(b)(1), unless the authorization is terminated  or revoked sooner.       Influenza A by PCR NEGATIVE NEGATIVE Final   Influenza B by PCR NEGATIVE NEGATIVE Final    Comment: (NOTE) The Xpert Xpress SARS-CoV-2/FLU/RSV plus assay is intended as an aid in the diagnosis of influenza from Nasopharyngeal swab  specimens and should not be used as a sole basis for treatment. Nasal washings and aspirates are unacceptable for Xpert Xpress SARS-CoV-2/FLU/RSV testing.  Fact Sheet for Patients: EntrepreneurPulse.com.au  Fact Sheet for Healthcare Providers: IncredibleEmployment.be  This test is not yet approved or cleared by the [place] FDA and has been authorized for detection and/or diagnosis of SARS-CoV-2 by FDA under an Emergency Use Authorization (EUA). This EUA will remain in effect (meaning this test can be used) for the duration of the COVID-19 declaration under Section 564(b)(1) of the Act, 21 U.S.C. section 360bbb-3(b)(1), unless the authorization is terminated or revoked.  Performed at [facility], [address] [place], [place] [address]   Culture, blood (routine x 2)     Status: None (Preliminary result)   Collection Time: 06/16/21  7:30 PM   Specimen: BLOOD LEFT FOREARM  Result Value Ref Range Status   Specimen Description   Final    BLOOD LEFT FOREARM Performed at [facility], [address] [place], [place] [address]    Special Requests   Final    BOTTLES DRAWN AEROBIC AND ANAEROBIC Blood Culture adequate volume Performed at [facility], [address] [place], [place] [address]    Culture   Final    NO GROWTH 4 DAYS Performed at [facility], [place] [address], [place], [place] [address]    Report Status PENDING  Incomplete  MRSA Next Gen by PCR, Nasal     Status: None   Collection Time: 06/17/21  2:07 AM   Specimen: Nasal Mucosa; Nasal Swab  Result Value Ref Range Status   MRSA by PCR Next Gen NOT DETECTED NOT DETECTED Final    Comment: (NOTE) The GeneXpert MRSA Assay (FDA approved for NASAL specimens only), is one component of a comprehensive MRSA colonization surveillance program. It is not intended to diagnose MRSA infection nor to guide or monitor treatment for MRSA  infections. Test performance is not FDA approved in patients less than 7 years old. Performed at [facility], [place] [address], [place], [place] [address]   Respiratory (~20 pathogens) panel by PCR     Status: None   Collection Time: 06/17/21 10:48 AM   Specimen: Nasopharyngeal Swab; Respiratory  Result Value Ref Range Status   Adenovirus NOT DETECTED NOT DETECTED Final   Coronavirus 229E NOT DETECTED NOT DETECTED Final    Comment: (NOTE) The Coronavirus on the Respiratory Panel, DOES NOT test for the novel  Coronavirus (2019 nCoV)    Coronavirus HKU1 NOT DETECTED NOT DETECTED Final   Coronavirus NL63 NOT DETECTED NOT DETECTED Final   Coronavirus OC43 NOT DETECTED NOT  DETECTED Final   Metapneumovirus NOT DETECTED NOT DETECTED Final   Rhinovirus / Enterovirus NOT DETECTED NOT DETECTED Final   Influenza A NOT DETECTED NOT DETECTED Final   Influenza B NOT DETECTED NOT DETECTED Final   Parainfluenza Virus 1 NOT DETECTED NOT DETECTED Final   Parainfluenza Virus 2 NOT DETECTED NOT DETECTED Final   Parainfluenza Virus 3 NOT DETECTED NOT DETECTED Final   Parainfluenza Virus 4 NOT DETECTED NOT DETECTED Final   Respiratory Syncytial Virus NOT DETECTED NOT DETECTED Final   Bordetella pertussis NOT DETECTED NOT DETECTED Final   Bordetella Parapertussis NOT DETECTED NOT DETECTED Final   Chlamydophila pneumoniae NOT DETECTED NOT DETECTED Final   Mycoplasma pneumoniae NOT DETECTED NOT DETECTED Final    Comment: Performed at [facility], [place] [address], [place], [place] [address]     Labs: Basic Metabolic Panel: Recent Labs  Lab 06/17/21 0423 06/17/21 1959 06/18/21 0202 06/19/21 0043 06/20/21 0052  NA 131* 133* 136 135 135  K 3.3* 3.8 3.5 3.4* 3.8  CL 101 104 105 104 105  CO2 22 21* 23 23 24   GLUCOSE 111* 170* 115* 113* 104*  BUN 13 14 12 8 8   CREATININE 0.93 0.85 0.81 0.70 0.71  CALCIUM 7.4* 8.0* 8.2* 8.3* 8.0*  MG 2.0  --  2.2 2.3 2.2  PHOS 2.8  --  1.7* 1.8* 2.9    Liver Function Tests: Recent Labs  Lab 06/17/21 1037  AST 20  ALT 30  ALKPHOS 73  BILITOT 1.0  PROT 4.7*  ALBUMIN 2.4*   No results for input(s): LIPASE, AMYLASE in the last 168 hours. No results for input(s): AMMONIA in the last 168 hours. CBC: Recent Labs  Lab 06/16/21 1928 06/17/21 0423 06/18/21 0202 06/19/21 0043 06/20/21 0052  WBC 3.1* 2.8*   3.0* 3.2* 5.4 7.7  NEUTROABS 0.1* 0.1* 0.3* 1.3* 2.5  HGB 11.7* 9.9*   10.0* 8.6* 9.9* 9.5*  HCT 35.2* 31.1*   30.4* 27.0* 30.1* 29.0*  MCV 85.9 88.6   87.4 87.9 86.7 87.1  PLT 212 186   164 159 199 210   Cardiac Enzymes: No results for input(s): CKTOTAL, CKMB, CKMBINDEX, TROPONINI in the last 168 hours. BNP: BNP (last 3 results) No results for input(s): BNP in the last 8760 hours.  ProBNP (last 3 results) Recent Labs    07/01/20 1529  PROBNP 18.0    CBG: Recent Labs  Lab 06/17/21 0815 06/17/21 1200 06/17/21 2113 06/18/21 0800 06/18/21 1523  GLUCAP 161* 114* 120* 126* 141*       Signed:  Kayleen Memos, MD [facility] 06/20/2021, 12:01 PM

## 2021-06-20 NOTE — Plan of Care (Signed)
Pt understanding of discharge instructions  

## 2021-06-21 LAB — CULTURE, BLOOD (ROUTINE X 2)
Culture: NO GROWTH
Special Requests: ADEQUATE

## 2021-06-21 LAB — PATHOLOGIST SMEAR REVIEW

## 2021-06-22 ENCOUNTER — Other Ambulatory Visit (HOSPITAL_COMMUNITY): Payer: Self-pay

## 2021-06-23 ENCOUNTER — Other Ambulatory Visit (HOSPITAL_COMMUNITY): Payer: Self-pay

## 2021-06-23 DIAGNOSIS — C9110 Chronic lymphocytic leukemia of B-cell type not having achieved remission: Secondary | ICD-10-CM | POA: Diagnosis not present

## 2021-06-23 DIAGNOSIS — J320 Chronic maxillary sinusitis: Secondary | ICD-10-CM | POA: Diagnosis not present

## 2021-06-23 DIAGNOSIS — D709 Neutropenia, unspecified: Secondary | ICD-10-CM | POA: Diagnosis not present

## 2021-06-23 DIAGNOSIS — E039 Hypothyroidism, unspecified: Secondary | ICD-10-CM | POA: Diagnosis not present

## 2021-06-23 DIAGNOSIS — J329 Chronic sinusitis, unspecified: Secondary | ICD-10-CM | POA: Diagnosis not present

## 2021-06-23 DIAGNOSIS — Z09 Encounter for follow-up examination after completed treatment for conditions other than malignant neoplasm: Secondary | ICD-10-CM | POA: Diagnosis not present

## 2021-06-23 DIAGNOSIS — F419 Anxiety disorder, unspecified: Secondary | ICD-10-CM | POA: Diagnosis not present

## 2021-06-23 DIAGNOSIS — R5081 Fever presenting with conditions classified elsewhere: Secondary | ICD-10-CM | POA: Diagnosis not present

## 2021-06-27 ENCOUNTER — Other Ambulatory Visit: Payer: Self-pay

## 2021-06-27 ENCOUNTER — Other Ambulatory Visit (HOSPITAL_COMMUNITY): Payer: Self-pay

## 2021-06-27 ENCOUNTER — Inpatient Hospital Stay: Payer: PPO | Admitting: Oncology

## 2021-06-27 ENCOUNTER — Other Ambulatory Visit: Payer: Self-pay | Admitting: Oncology

## 2021-06-27 ENCOUNTER — Inpatient Hospital Stay: Payer: PPO | Attending: Oncology

## 2021-06-27 VITALS — BP 134/57 | HR 84 | Temp 97.9°F | Resp 17 | Ht 66.0 in | Wt 154.2 lb

## 2021-06-27 DIAGNOSIS — M069 Rheumatoid arthritis, unspecified: Secondary | ICD-10-CM | POA: Diagnosis not present

## 2021-06-27 DIAGNOSIS — D630 Anemia in neoplastic disease: Secondary | ICD-10-CM | POA: Insufficient documentation

## 2021-06-27 DIAGNOSIS — D638 Anemia in other chronic diseases classified elsewhere: Secondary | ICD-10-CM | POA: Diagnosis not present

## 2021-06-27 DIAGNOSIS — Z79899 Other long term (current) drug therapy: Secondary | ICD-10-CM | POA: Insufficient documentation

## 2021-06-27 DIAGNOSIS — C9110 Chronic lymphocytic leukemia of B-cell type not having achieved remission: Secondary | ICD-10-CM | POA: Insufficient documentation

## 2021-06-27 LAB — CMP (CANCER CENTER ONLY)
ALT: 15 U/L (ref 0–44)
AST: 14 U/L — ABNORMAL LOW (ref 15–41)
Albumin: 3.6 g/dL (ref 3.5–5.0)
Alkaline Phosphatase: 74 U/L (ref 38–126)
Anion gap: 3 — ABNORMAL LOW (ref 5–15)
BUN: 21 mg/dL (ref 8–23)
CO2: 32 mmol/L (ref 22–32)
Calcium: 9.3 mg/dL (ref 8.9–10.3)
Chloride: 105 mmol/L (ref 98–111)
Creatinine: 0.71 mg/dL (ref 0.44–1.00)
GFR, Estimated: >60 mL/min (ref >60)
Glucose, Bld: 90 mg/dL (ref 70–99)
Potassium: 4.7 mmol/L (ref 3.5–5.1)
Sodium: 140 mmol/L (ref 135–145)
Total Bilirubin: 0.4 mg/dL (ref 0.3–1.2)
Total Protein: 6.1 g/dL — ABNORMAL LOW (ref 6.5–8.1)

## 2021-06-27 LAB — CBC WITH DIFFERENTIAL (CANCER CENTER ONLY)
Abs Immature Granulocytes: 0.07 10*3/uL (ref 0.00–0.07)
Basophils Absolute: 0.1 10*3/uL (ref 0.0–0.1)
Basophils Relative: 1 %
Eosinophils Absolute: 0.2 10*3/uL (ref 0.0–0.5)
Eosinophils Relative: 4 %
HCT: 34.5 % — ABNORMAL LOW (ref 36.0–46.0)
Hemoglobin: 11.1 g/dL — ABNORMAL LOW (ref 12.0–15.0)
Immature Granulocytes: 1 %
Lymphocytes Relative: 19 %
Lymphs Abs: 1.1 10*3/uL (ref 0.7–4.0)
MCH: 28.4 pg (ref 26.0–34.0)
MCHC: 32.2 g/dL (ref 30.0–36.0)
MCV: 88.2 fL (ref 80.0–100.0)
Monocytes Absolute: 0.8 10*3/uL (ref 0.1–1.0)
Monocytes Relative: 14 %
Neutro Abs: 3.3 10*3/uL (ref 1.7–7.7)
Neutrophils Relative %: 61 %
Platelet Count: 401 10*3/uL — ABNORMAL HIGH (ref 150–400)
RBC: 3.91 MIL/uL (ref 3.87–5.11)
RDW: 15 % (ref 11.5–15.5)
WBC Count: 5.5 10*3/uL (ref 4.0–10.5)
nRBC: 0 % (ref 0.0–0.2)

## 2021-06-27 NOTE — Progress Notes (Signed)
Hematology and Oncology Follow Up Visit  Nicole Campbell [id number] July 25, 1945 76 y.o. 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  Prior therapy:   She is status post rituximab weekly for 4 weeks completed on August 21, 2018. She is status post thoracentesis completed on August 12, 2019 with positive cytology. Rituximab 375 mg per metered square weekly started on October 13 of 2020 and completed on 03/04/2019.  This will be repeated every 3 months last treatment completed in March 2021. Bendamustine and rituximab started on Sep 03, 2019.  She is here for cycle 3 of therapy.  Current therapy: Ibrutinib 420 mg daily started in July 2020.      Interim History: Ms. Nicole Campbell returns today for a follow-up evaluation.  Since the last visit, she was hospitalized for fever and found to have neutropenia that spontaneously resolved.  Since her discharge, she reports feeling well without any major complaints.  She denies any nausea, vomiting or abdominal pain.  She denies any fevers or chills.  She denies any palpitation or dyspnea.  She does report lower extremity edema level of the ankle.         .    Medications: Updated on review. Current Outpatient Medications  Medication Sig Dispense Refill   acetaminophen (TYLENOL) 650 MG CR tablet Take 1,300 mg by mouth every 8 (eight) hours as needed for pain.     escitalopram (LEXAPRO) 5 MG tablet Take 5 mg by mouth daily.     feeding supplement (ENSURE ENLIVE / ENSURE PLUS) LIQD Take 237 mLs by mouth 2 (two) times daily between meals for 7 days. 3318 mL 0   furosemide (LASIX) 20 MG tablet Take 1 tablet (20 mg total) by mouth daily as needed. 60 tablet 11   ibrutinib (IMBRUVICA) 420 MG tablet TAKE 1 TABLET (420 MG) BY MOUTH DAILY. 28 tablet 0   levothyroxine (SYNTHROID, LEVOTHROID) 25 MCG tablet  Take 25 mcg by mouth daily before breakfast.      Multiple Vitamin (MULTIVITAMIN WITH MINERALS) TABS tablet Take 1 tablet by mouth daily. 90 tablet 0   polyethylene glycol (MIRALAX / GLYCOLAX) 17 g packet Take 17 g by mouth daily as needed for moderate constipation. 14 each 0   potassium chloride (KLOR-CON M) 10 MEQ tablet Take 1 tablet (10 mEq total) by mouth daily as needed. Pease take along with lasix if needed for legs swelling. 30 tablet 0   No current facility-administered medications for this visit.     Allergies:  Allergies  Allergen Reactions   Methotrexate Other (See Comments)    Other reaction(s): horrible lower back pain   Erythromycin Palpitations        Physical Exam:         Blood pressure (!) 134/57, pulse 84, temperature 97.9 F (36.6 C), temperature source Temporal, resp. rate 17, height '5\' 6"'  (1.676 m), weight 154 lb 3.2 oz (69.9 kg), SpO2 100 %.     ECOG:1      General appearance: Comfortable appearing without any discomfort Head: Normocephalic without any trauma Oropharynx: Mucous membranes are moist and pink without any thrush or ulcers. Eyes: Pupils are equal and round reactive to light. Lymph nodes: No cervical, supraclavicular, inguinal or axillary lymphadenopathy.   Heart:regular rate and rhythm.  S1 and S2 without leg edema. Lung: Clear without any rhonchi or wheezes.  No dullness to percussion. Abdomin:  Soft, nontender, nondistended with good bowel sounds.  No hepatosplenomegaly. Musculoskeletal: No joint deformity or effusion.  Full range of motion noted. Neurological: No deficits noted on motor, sensory and deep tendon reflex exam. Skin: No petechial rash or dryness.  Appeared moist.                         Lab Results: Lab Results  Component Value Date   WBC 7.7 06/20/2021   HGB 9.5 (L) 06/20/2021   HCT 29.0 (L) 06/20/2021   MCV 87.1 06/20/2021   PLT 210 06/20/2021     Chemistry      Component Value  Date/Time   NA 135 06/20/2021 0052   K 3.8 06/20/2021 0052   CL 105 06/20/2021 0052   CO2 24 06/20/2021 0052   BUN 8 06/20/2021 0052   CREATININE 0.71 06/20/2021 0052   CREATININE 0.82 04/21/2021 0931      Component Value Date/Time   CALCIUM 8.0 (L) 06/20/2021 0052   ALKPHOS 73 06/17/2021 1037   AST 20 06/17/2021 1037   AST 15 04/21/2021 0931   ALT 30 06/17/2021 1037   ALT 13 04/21/2021 0931   BILITOT 1.0 06/17/2021 1037   BILITOT 0.7 04/21/2021 09337     :   76 year old woman with:  1.  CLL presented with lymphocytosis and adenopathy in 2019.    She is currently on ibrutinib with excellent response.  CBC obtained on June 20, 2021 showed a white cell count of 7.7, hemoglobin of 9.5 and a platelet count of 148 with normal lymphocytosis and mild neutropenia.  Risks and benefits of continuing this treatment were discussed and alternative treatment options such as Cal[place]ence were reiterated.   After discussion today, I have recommended proceeding with ibrutinib therapy.  CBC was reviewed today and showed normal neutrophil count.   2.  Rheumatoid arthritis: She is not on any current therapy without any recent exacerbation.   3.  Neutropenic fever: Her neutropenia has resolved and will resume ibrutinib at this time.  4.  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  Hemoglobin is adequate.  5.  Follow-up: She will return in 4 weeks for repeat follow-up.   30  minutes were spent on this encounter.  The time was dedicated to reviewing laboratory data, disease status update and outlining future plan of care.   Zola Button, MD 2/27/202310:28 AM

## 2021-06-28 ENCOUNTER — Other Ambulatory Visit: Payer: Self-pay

## 2021-06-28 ENCOUNTER — Encounter (HOSPITAL_BASED_OUTPATIENT_CLINIC_OR_DEPARTMENT_OTHER): Payer: Self-pay

## 2021-06-28 ENCOUNTER — Emergency Department (HOSPITAL_BASED_OUTPATIENT_CLINIC_OR_DEPARTMENT_OTHER)
Admission: EM | Admit: 2021-06-28 | Discharge: 2021-06-29 | Disposition: A | Discharge status: Home / Self Care | Payer: PPO | Attending: Emergency Medicine | Admitting: Emergency Medicine

## 2021-06-28 ENCOUNTER — Emergency Department (HOSPITAL_BASED_OUTPATIENT_CLINIC_OR_DEPARTMENT_OTHER): Payer: PPO

## 2021-06-28 ENCOUNTER — Encounter: Payer: Self-pay | Admitting: Oncology

## 2021-06-28 DIAGNOSIS — R112 Nausea with vomiting, unspecified: Secondary | ICD-10-CM | POA: Insufficient documentation

## 2021-06-28 DIAGNOSIS — R197 Diarrhea, unspecified: Secondary | ICD-10-CM | POA: Insufficient documentation

## 2021-06-28 DIAGNOSIS — J90 Pleural effusion, not elsewhere classified: Secondary | ICD-10-CM | POA: Diagnosis not present

## 2021-06-28 DIAGNOSIS — J449 Chronic obstructive pulmonary disease, unspecified: Secondary | ICD-10-CM | POA: Diagnosis not present

## 2021-06-28 DIAGNOSIS — I959 Hypotension, unspecified: Secondary | ICD-10-CM | POA: Diagnosis not present

## 2021-06-28 DIAGNOSIS — D8489 Other immunodeficiencies: Secondary | ICD-10-CM | POA: Diagnosis not present

## 2021-06-28 DIAGNOSIS — R110 Nausea: Secondary | ICD-10-CM | POA: Diagnosis not present

## 2021-06-28 DIAGNOSIS — Z20822 Contact with and (suspected) exposure to covid-19: Secondary | ICD-10-CM | POA: Insufficient documentation

## 2021-06-28 DIAGNOSIS — R509 Fever, unspecified: Secondary | ICD-10-CM | POA: Diagnosis not present

## 2021-06-28 DIAGNOSIS — C9110 Chronic lymphocytic leukemia of B-cell type not having achieved remission: Secondary | ICD-10-CM | POA: Insufficient documentation

## 2021-06-28 DIAGNOSIS — Z79620 Long term (current) use of immunosuppressive biologic: Secondary | ICD-10-CM | POA: Insufficient documentation

## 2021-06-28 LAB — RESP PANEL BY RT-PCR (FLU A&B, COVID) ARPGX2
Influenza A by PCR: NEGATIVE
Influenza B by PCR: NEGATIVE
SARS Coronavirus 2 by RT PCR: NEGATIVE

## 2021-06-28 LAB — CBC WITH DIFFERENTIAL/PLATELET
Abs Immature Granulocytes: 0.18 10*3/uL — ABNORMAL HIGH (ref 0.00–0.07)
Basophils Absolute: 0 10*3/uL (ref 0.0–0.1)
Basophils Relative: 0 %
Eosinophils Absolute: 0.2 10*3/uL (ref 0.0–0.5)
Eosinophils Relative: 1 %
HCT: 36.1 % (ref 36.0–46.0)
Hemoglobin: 11.6 g/dL — ABNORMAL LOW (ref 12.0–15.0)
Immature Granulocytes: 1 %
Lymphocytes Relative: 2 %
Lymphs Abs: 0.6 10*3/uL — ABNORMAL LOW (ref 0.7–4.0)
MCH: 28.6 pg (ref 26.0–34.0)
MCHC: 32.1 g/dL (ref 30.0–36.0)
MCV: 89.1 fL (ref 80.0–100.0)
Monocytes Absolute: 1.4 10*3/uL — ABNORMAL HIGH (ref 0.1–1.0)
Monocytes Relative: 6 %
Neutro Abs: 22.2 10*3/uL — ABNORMAL HIGH (ref 1.7–7.7)
Neutrophils Relative %: 90 %
Platelets: 432 10*3/uL — ABNORMAL HIGH (ref 150–400)
RBC: 4.05 MIL/uL (ref 3.87–5.11)
RDW: 14.9 % (ref 11.5–15.5)
WBC: 24.6 10*3/uL — ABNORMAL HIGH (ref 4.0–10.5)
nRBC: 0 % (ref 0.0–0.2)

## 2021-06-28 LAB — COMPREHENSIVE METABOLIC PANEL
ALT: 18 U/L (ref 0–44)
AST: 17 U/L (ref 15–41)
Albumin: 3.3 g/dL — ABNORMAL LOW (ref 3.5–5.0)
Alkaline Phosphatase: 74 U/L (ref 38–126)
Anion gap: 7 (ref 5–15)
BUN: 24 mg/dL — ABNORMAL HIGH (ref 8–23)
CO2: 28 mmol/L (ref 22–32)
Calcium: 8.6 mg/dL — ABNORMAL LOW (ref 8.9–10.3)
Chloride: 99 mmol/L (ref 98–111)
Creatinine, Ser: 0.81 mg/dL (ref 0.44–1.00)
GFR, Estimated: >60 mL/min (ref >60)
Glucose, Bld: 142 mg/dL — ABNORMAL HIGH (ref 70–99)
Potassium: 4.1 mmol/L (ref 3.5–5.1)
Sodium: 134 mmol/L — ABNORMAL LOW (ref 135–145)
Total Bilirubin: 0.6 mg/dL (ref 0.3–1.2)
Total Protein: 6.1 g/dL — ABNORMAL LOW (ref 6.5–8.1)

## 2021-06-28 LAB — LIPASE, BLOOD: Lipase: 73 U/L — ABNORMAL HIGH (ref 11–51)

## 2021-06-28 MED ORDER — LIDOCAINE HCL (PF) 1 % IJ SOLN
INTRAMUSCULAR | Status: AC
  Filled 2021-06-28: qty 5

## 2021-06-28 MED ORDER — IBRUTINIB 420 MG PO TABS
ORAL_TABLET | ORAL | 0 refills | Status: DC
  Filled 2021-06-29: qty 28, 28d supply, fill #0

## 2021-06-28 MED ORDER — LACTATED RINGERS IV BOLUS: 500.0000 mL | Freq: Once | INTRAVENOUS | Status: DC

## 2021-06-28 NOTE — Progress Notes (Signed)
? ? ? ? ?HPI:FU palpitations. CTA August 2020 showed no aneurysm or dissection.  There was increase in lymphadenopathy (Pt with known CLL).  Venous Dopplers April 2021 showed no DVT.  Chest CT April 2021 showed marked progression of the adenopathy consistent with lymphoma.  There was note of new small to moderate bilateral pleural effusions. Patient had thoracentesis and cytology consistent with lymphoma. Monitor May 2021 showed sinus with occasional PACs, brief PAT and PVCs. Echocardiogram February 2023 showed normal LV function.  Since last seen her dyspnea has improved.  She denies orthopnea, PND, exertional chest pain or syncope.  Occasional palpitations that are not particular bothersome.  Note her metoprolol was discontinued as she had problems with blood pressure at the time of a neutropenic fever.  She has occasional mild pedal edema. ? ?Current Outpatient Medications  ?Medication Sig Dispense Refill  ? acetaminophen (TYLENOL) 650 MG CR tablet Take 1,300 mg by mouth every 8 (eight) hours as needed for pain.    ? escitalopram (LEXAPRO) 5 MG tablet Take 5 mg by mouth daily.    ? feeding supplement (BOOST HIGH PROTEIN) LIQD Take 1 Container by mouth daily with breakfast.    ? furosemide (LASIX) 20 MG tablet Take 1 tablet (20 mg total) by mouth daily as needed. 60 tablet 11  ? ibrutinib (IMBRUVICA) 420 MG tablet TAKE 1 TABLET (420 MG) BY MOUTH DAILY. 28 tablet 0  ? levothyroxine (SYNTHROID, LEVOTHROID) 25 MCG tablet Take 25 mcg by mouth daily before breakfast.     ? Multiple Vitamin (MULTIVITAMIN WITH MINERALS) TABS tablet Take 1 tablet by mouth daily. 90 tablet 0  ? ondansetron (ZOFRAN-ODT) 4 MG disintegrating tablet Take 1 tablet (4 mg total) by mouth every 8 (eight) hours as needed for nausea or vomiting. 12 tablet 0  ? polyethylene glycol (MIRALAX / GLYCOLAX) 17 g packet Take 17 g by mouth daily as needed for moderate constipation. 14 each 0  ? potassium chloride (KLOR-CON M) 10 MEQ tablet Take 1 tablet (10  mEq total) by mouth daily as needed. Pease take along with lasix if needed for legs swelling. 30 tablet 0  ? ?No current facility-administered medications for this visit.  ? ? ? ?Past Medical History:  ?Diagnosis Date  ? CLL (chronic lymphocytic leukemia) ([place])   ? Hypertension   ? Hypothyroid   ? MVP (mitral valve prolapse)   ? Rheumatoid arthritis ([place])   ? ? ?Past Surgical History:  ?Procedure Laterality Date  ? ABDOMINAL HYSTERECTOMY    ? IR THORACENTESIS ASP PLEURAL SPACE W/IMG GUIDE  11/21/2019  ? IR THORACENTESIS ASP PLEURAL SPACE W/IMG GUIDE  07/05/2020  ? TONSILLECTOMY    ? ? ?Social History  ? ?Socioeconomic History  ? Marital status: Married  ?  Spouse name: Not on file  ? Number of children: 3  ? Years of education: Not on file  ? Highest education level: Not on file  ?Occupational History  ? Not on file  ?Tobacco Use  ? Smoking status: Never  ? Smokeless tobacco: Never  ?Vaping Use  ? Vaping Use: Never used  ?Substance and Sexual Activity  ? Alcohol use: Yes  ?  Comment: Rare  ? Drug use: Never  ? Sexual activity: Not on file  ?Other Topics Concern  ? Not on file  ?Social History Narrative  ? Not on file  ? ?Social Determinants of Health  ? ?Financial Resource Strain: Not on file  ?Food Insecurity: Not on file  ?Transportation Needs: Not  on file  ?Physical Activity: Not on file  ?Stress: Not on file  ?Social Connections: Not on file  ?Intimate Partner Violence: Not on file  ? ? ?Family History  ?Problem Relation Age of Onset  ? Congestive Heart Failure Father   ? ? ?ROS: no fevers or chills, productive cough, hemoptysis, dysphasia, odynophagia, melena, hematochezia, dysuria, hematuria, rash, seizure activity, orthopnea, PND, pedal edema, claudication. Remaining systems are negative. ? ?Physical Exam: ?Well-developed well-nourished in no acute distress.  ?Skin is warm and dry.  ?HEENT is normal.  ?Neck is supple.  ?Chest is clear to auscultation with normal expansion.  ?Cardiovascular exam is regular rate  and rhythm.  ?Abdominal exam nontender or distended. No masses palpated. ?Extremities show trace edema. ?neuro grossly intact ? ? ?A/P ? ?1 palpitations-symptoms are controlled at present.  Beta-blocker discontinued previously due to hypotension in the setting of neutropenic fever.  I have instructed her to take this occasionally as needed if palpitations are severe. ? ?2 history of mitral valve prolapse-not evident on most recent echocardiogram. ? ?3 lymphoma-Per oncology.  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 ? ?Kirk Ruths, MD ? ? ? ?

## 2021-06-28 NOTE — ED Provider Notes (Signed)
[facility] EMERGENCY DEPARTMENT Provider Note   CSN: [id number] Arrival date & time: 06/28/21  2202     History  Chief Complaint  Patient presents with   Emesis    Nicole Campbell is a 76 y.o. female.  The history is provided by the patient and medical records.  Emesis Nicole Campbell is a 76 y.o. female who presents to the Emergency Department complaining of N/V.  Around 7 PM she developed nausea and vomiting.  She started feeling unwell around 6:30 PM.  She has a history of CLL and was admitted to the hospital with neutropenic fever 1 week ago.  She has been feeling well since hospital discharge.  She saw PCP, ENT and hematology this week.  Her Imbruvica for CLL was restarted yesterday.  No associated fevers, cough, sore throat.  No known sick contacts.  No known bad food exposures.  Lives with husband and he ate the same meal today without any symptoms.  She reports 4 episodes of nonbloody emesis as well as 6 small-volume soft brown stools.  No abdominal pain.  Received zofran PTA by EMS.      Home Medications Prior to Admission medications   Medication Sig Start Date End Date Taking? Authorizing Provider  ondansetron (ZOFRAN-ODT) 4 MG disintegrating tablet Take 1 tablet (4 mg total) by mouth every 8 (eight) hours as needed for nausea or vomiting. 06/29/21  Yes Quintella Reichert, MD  acetaminophen (TYLENOL) 650 MG CR tablet Take 1,300 mg by mouth every 8 (eight) hours as needed for pain.    [provider]  escitalopram (LEXAPRO) 5 MG tablet Take 5 mg by mouth daily. 12/15/20   [provider]  furosemide (LASIX) 20 MG tablet Take 1 tablet (20 mg total) by mouth daily as needed. 06/20/21 07/20/21  Kayleen Memos, DO  ibrutinib (IMBRUVICA) 420 MG tablet TAKE 1 TABLET (420 MG) BY MOUTH DAILY. 06/27/21 06/27/22  Kayleen Memos, DO  ibrutinib (IMBRUVICA) 420 MG tablet TAKE 1 TABLET (420 MG) BY MOUTH DAILY. 06/28/21 06/28/22  Wyatt Portela, MD  levothyroxine  (SYNTHROID, LEVOTHROID) 25 MCG tablet Take 25 mcg by mouth daily before breakfast.  04/30/17   [provider]  Multiple Vitamin (MULTIVITAMIN WITH MINERALS) TABS tablet Take 1 tablet by mouth daily. 06/20/21 09/18/21  Kayleen Memos, DO  polyethylene glycol (MIRALAX / GLYCOLAX) 17 g packet Take 17 g by mouth daily as needed for moderate constipation. 06/20/21   Kayleen Memos, DO  potassium chloride (KLOR-CON M) 10 MEQ tablet Take 1 tablet (10 mEq total) by mouth daily as needed. Pease take along with lasix if needed for legs swelling. 06/20/21 07/20/21  Kayleen Memos, DO      Allergies    Methotrexate and Erythromycin    Review of Systems   Review of Systems  Gastrointestinal:  Positive for vomiting.  All other systems reviewed and are negative.  Physical Exam Updated Vital Signs BP (!) 113/53    Pulse 80    Temp 97.9 F (36.6 C) (Oral)    Resp 17    Ht 5' 6.5" (1.689 m)    Wt 70.3 kg    SpO2 100%    BMI 24.64 kg/m  Physical Exam Vitals and nursing note reviewed.  Constitutional:      Appearance: She is well-developed.  HENT:     Head: Normocephalic and atraumatic.  Cardiovascular:     Rate and Rhythm: Normal rate and regular rhythm.  Heart sounds: No murmur heard. Pulmonary:     Effort: Pulmonary effort is normal. No respiratory distress.     Breath sounds: Normal breath sounds.  Abdominal:     Palpations: Abdomen is soft.     Tenderness: There is no abdominal tenderness. There is no guarding or rebound.  Musculoskeletal:        General: No tenderness.     Comments: 2+ DP pulses.  2+ pitting edema to BLE.  Venous stasis changes to BLE, right greater than left (pt reports chronic).  Skin:    General: Skin is warm and dry.  Neurological:     Mental Status: She is alert and oriented to person, place, and time.  Psychiatric:        Behavior: Behavior normal.    ED Results / Procedures / Treatments   Labs (all labs ordered are listed, but only abnormal results are  displayed) Labs Reviewed  LIPASE, BLOOD - Abnormal; Notable for the following components:      Result Value   Lipase 73 (*)    All other components within normal limits  COMPREHENSIVE METABOLIC PANEL - Abnormal; Notable for the following components:   Sodium 134 (*)    Glucose, Bld 142 (*)    BUN 24 (*)    Calcium 8.6 (*)    Total Protein 6.1 (*)    Albumin 3.3 (*)    All other components within normal limits  URINALYSIS, ROUTINE W REFLEX MICROSCOPIC - Abnormal; Notable for the following components:   Hgb urine dipstick TRACE (*)    All other components within normal limits  CBC WITH DIFFERENTIAL/PLATELET - Abnormal; Notable for the following components:   WBC 24.6 (*)    Hemoglobin 11.6 (*)    Platelets 432 (*)    Neutro Abs 22.2 (*)    Lymphs Abs 0.6 (*)    Monocytes Absolute 1.4 (*)    Abs Immature Granulocytes 0.18 (*)    All other components within normal limits  URINALYSIS, MICROSCOPIC (REFLEX) - Abnormal; Notable for the following components:   Bacteria, UA RARE (*)    All other components within normal limits  RESP PANEL BY RT-PCR (FLU A&B, COVID) ARPGX2  GASTROINTESTINAL PANEL BY PCR, STOOL (REPLACES STOOL CULTURE)  C DIFFICILE QUICK SCREEN W PCR REFLEX    CULTURE, BLOOD (ROUTINE X 2)  CULTURE, BLOOD (ROUTINE X 2)  LACTIC ACID, PLASMA    EKG None  Radiology DG Chest Port 1 View  Result Date: 06/28/2021 CLINICAL DATA:  Vomiting EXAM: PORTABLE CHEST 1 VIEW COMPARISON:  06/16/2021 FINDINGS: The lungs are hyperinflated in keeping with changes of underlying COPD. Small bilateral pleural effusions are present, stable since prior examination. No pneumothorax. Cardiac size within normal limits. Pulmonary vascularity is normal. No acute bone abnormality. Degenerative changes noted within the shoulders bilaterally. IMPRESSION: Stable small bilateral pleural effusions. COPD. Electronically Signed   By: Fidela Salisbury M.D.   On: 06/28/2021 23:29    Procedures Procedures     Medications Ordered in ED Medications - No data to display   ED Course/ Medical Decision Making/ A&P                           Medical Decision Making Amount and/or Complexity of Data Reviewed Labs: ordered. Radiology: ordered.  Risk Prescription drug management.   Patient with history of CLL here for evaluation of nausea, vomiting, diarrhea.  Patient nontoxic-appearing on evaluation.  She received EMS prehospital  and her nausea has resolved.  Abdominal examination is benign with no abdominal tenderness.  CBC significant for leukocytosis to 24,000.  Records reviewed in epic, her last white count was 5.5 yesterday.  No bands on differential.  Labs with stable renal function.  Her lactic acid is within normal limits.  She was given a trial of oral fluids in the department and able to tolerate fluids without difficulty with no recurrent nausea, vomiting or abdominal pain.  Suspect that patient has gastroenteritis versus foodborne illness.  Stool samples are pending at time of discharge given patient's recent antibiotic exposure.  Plan to discharge home with close outpatient follow-up as well as return precautions.  Current clinical picture is not consistent with serious bacterial infection, sepsis.        Final Clinical Impression(s) / ED Diagnoses Final diagnoses:  Nausea vomiting and diarrhea    Rx / DC Orders ED Discharge Orders          Ordered    ondansetron (ZOFRAN-ODT) 4 MG disintegrating tablet  Every 8 hours PRN        06/29/21 0148              Quintella Reichert, MD 06/29/21 0230

## 2021-06-28 NOTE — ED Triage Notes (Addendum)
PER EMS: pt is from home with c/o n/v/d onset tonight at 7pm after dinner. She has leukemia and last took chemo pill this morning at 0930 after not taking it for 10 days - recent d/c last Monday from [facility] for neutropenia. Pt currently denies pain, denies fever.   HR-89, 99% RA, CBG 112, 125/71  Pt received 4 mg zofran and 200 cc NaCl en route

## 2021-06-29 ENCOUNTER — Other Ambulatory Visit (HOSPITAL_COMMUNITY): Payer: Self-pay

## 2021-06-29 LAB — URINALYSIS, MICROSCOPIC (REFLEX)

## 2021-06-29 LAB — GASTROINTESTINAL PANEL BY PCR, STOOL (REPLACES STOOL CULTURE)

## 2021-06-29 LAB — URINALYSIS, ROUTINE W REFLEX MICROSCOPIC
Bilirubin Urine: NEGATIVE
Glucose, UA: NEGATIVE mg/dL
Ketones, ur: NEGATIVE mg/dL
Leukocytes,Ua: NEGATIVE
Nitrite: NEGATIVE
Protein, ur: NEGATIVE mg/dL
Specific Gravity, Urine: 1.02 (ref 1.005–1.030)
pH: 7 (ref 5.0–8.0)

## 2021-06-29 LAB — C DIFFICILE QUICK SCREEN W PCR REFLEX
C Diff antigen: NEGATIVE
C Diff interpretation: NOT DETECTED
C Diff toxin: NEGATIVE

## 2021-06-29 LAB — LACTIC ACID, PLASMA: Lactic Acid, Venous: 1.9 mmol/L (ref 0.5–1.9)

## 2021-06-29 MED ORDER — ONDANSETRON 4 MG PO TBDP: 4.0000 mg | ORAL_TABLET | Freq: Three times a day (TID) | ORAL | 0 refills | Status: DC | PRN

## 2021-06-29 NOTE — Discharge Instructions (Addendum)
Your white blood cell count was high today.  Please follow up with your family doctor or hematologist for recheck.   ? ?Get rechecked if you develop pain, fevers, uncontrolled symptoms or new concerning symptoms.   ?

## 2021-06-29 NOTE — ED Notes (Signed)
Provider aware of inability to get second blood culture. ?

## 2021-07-04 LAB — CULTURE, BLOOD (ROUTINE X 2)
Culture: NO GROWTH
Special Requests: ADEQUATE

## 2021-07-05 ENCOUNTER — Other Ambulatory Visit (HOSPITAL_COMMUNITY): Payer: Self-pay

## 2021-07-11 ENCOUNTER — Other Ambulatory Visit: Payer: Self-pay

## 2021-07-11 ENCOUNTER — Encounter: Payer: Self-pay | Admitting: Cardiology

## 2021-07-11 ENCOUNTER — Ambulatory Visit: Payer: PPO | Admitting: Cardiology

## 2021-07-11 VITALS — BP 134/70 | HR 70 | Ht 66.5 in | Wt 151.1 lb

## 2021-07-11 DIAGNOSIS — R002 Palpitations: Secondary | ICD-10-CM

## 2021-07-11 DIAGNOSIS — I341 Nonrheumatic mitral (valve) prolapse: Secondary | ICD-10-CM | POA: Diagnosis not present

## 2021-07-11 NOTE — Patient Instructions (Signed)

## 2021-07-15 ENCOUNTER — Emergency Department
Admission: EM | Admit: 2021-07-15 | Discharge: 2021-07-15 | Discharge status: Against Medical Advice | Payer: PPO | Source: Home / Self Care

## 2021-07-15 ENCOUNTER — Other Ambulatory Visit: Payer: Self-pay

## 2021-07-15 DIAGNOSIS — H1031 Unspecified acute conjunctivitis, right eye: Secondary | ICD-10-CM | POA: Diagnosis not present

## 2021-07-21 ENCOUNTER — Other Ambulatory Visit: Payer: Self-pay

## 2021-07-21 ENCOUNTER — Inpatient Hospital Stay: Payer: PPO | Attending: Oncology

## 2021-07-21 ENCOUNTER — Inpatient Hospital Stay: Payer: PPO | Admitting: Oncology

## 2021-07-21 VITALS — BP 136/62 | HR 69 | Temp 97.8°F | Resp 16 | Ht 66.5 in | Wt 153.0 lb

## 2021-07-21 DIAGNOSIS — H35371 Puckering of macula, right eye: Secondary | ICD-10-CM | POA: Diagnosis not present

## 2021-07-21 DIAGNOSIS — C9110 Chronic lymphocytic leukemia of B-cell type not having achieved remission: Secondary | ICD-10-CM | POA: Insufficient documentation

## 2021-07-21 DIAGNOSIS — H0288B Meibomian gland dysfunction left eye, upper and lower eyelids: Secondary | ICD-10-CM | POA: Diagnosis not present

## 2021-07-21 DIAGNOSIS — M069 Rheumatoid arthritis, unspecified: Secondary | ICD-10-CM | POA: Diagnosis not present

## 2021-07-21 DIAGNOSIS — Z79899 Other long term (current) drug therapy: Secondary | ICD-10-CM | POA: Diagnosis not present

## 2021-07-21 DIAGNOSIS — H04123 Dry eye syndrome of bilateral lacrimal glands: Secondary | ICD-10-CM | POA: Diagnosis not present

## 2021-07-21 DIAGNOSIS — H0288A Meibomian gland dysfunction right eye, upper and lower eyelids: Secondary | ICD-10-CM | POA: Diagnosis not present

## 2021-07-21 LAB — CMP (CANCER CENTER ONLY)
ALT: 17 U/L (ref 0–44)
AST: 18 U/L (ref 15–41)
Albumin: 4 g/dL (ref 3.5–5.0)
Alkaline Phosphatase: 68 U/L (ref 38–126)
Anion gap: 4 — ABNORMAL LOW (ref 5–15)
BUN: 23 mg/dL (ref 8–23)
CO2: 31 mmol/L (ref 22–32)
Calcium: 9.6 mg/dL (ref 8.9–10.3)
Chloride: 106 mmol/L (ref 98–111)
Creatinine: 0.83 mg/dL (ref 0.44–1.00)
GFR, Estimated: >60 mL/min (ref >60)
Glucose, Bld: 86 mg/dL (ref 70–99)
Potassium: 4.4 mmol/L (ref 3.5–5.1)
Sodium: 141 mmol/L (ref 135–145)
Total Bilirubin: 0.5 mg/dL (ref 0.3–1.2)
Total Protein: 6.1 g/dL — ABNORMAL LOW (ref 6.5–8.1)

## 2021-07-21 LAB — CBC WITH DIFFERENTIAL (CANCER CENTER ONLY)
Abs Immature Granulocytes: 0.01 10*3/uL (ref 0.00–0.07)
Basophils Absolute: 0.1 10*3/uL (ref 0.0–0.1)
Basophils Relative: 1 %
Eosinophils Absolute: 0.3 10*3/uL (ref 0.0–0.5)
Eosinophils Relative: 5 %
HCT: 40.2 % (ref 36.0–46.0)
Hemoglobin: 13 g/dL (ref 12.0–15.0)
Immature Granulocytes: 0 %
Lymphocytes Relative: 24 %
Lymphs Abs: 1.4 10*3/uL (ref 0.7–4.0)
MCH: 28.8 pg (ref 26.0–34.0)
MCHC: 32.3 g/dL (ref 30.0–36.0)
MCV: 89.1 fL (ref 80.0–100.0)
Monocytes Absolute: 1 10*3/uL (ref 0.1–1.0)
Monocytes Relative: 16 %
Neutro Abs: 3.3 10*3/uL (ref 1.7–7.7)
Neutrophils Relative %: 54 %
Platelet Count: 215 10*3/uL (ref 150–400)
RBC: 4.51 MIL/uL (ref 3.87–5.11)
RDW: 14.5 % (ref 11.5–15.5)
WBC Count: 6 10*3/uL (ref 4.0–10.5)
nRBC: 0 % (ref 0.0–0.2)

## 2021-07-21 NOTE — Progress Notes (Signed)
Hematology and Oncology Follow Up Visit ? ?Nicole Campbell ?[id number] ?[birth year] 76 y.o. ?07/21/2021 8:08 AM ?Nicole Campbell, MDSmith, [name], MD  ? 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  She presented with leukocytosis, lymphadenopathy, p53 deletion and deletion of ATM. ? ?Prior therapy:  ? ?She is status post rituximab weekly for 4 weeks completed on August 21, 2018. ?She is status post thoracentesis completed on August 12, 2019 with positive cytology. ?Rituximab 375 mg per metered square weekly started on October 13 of 2020 and completed on 03/04/2019.  This will be repeated every 3 months last treatment completed in March 2021. ?Bendamustine and rituximab started on Sep 03, 2019.  She is here for cycle 3 of therapy. ? ?Current therapy: Ibrutinib 420 mg daily started in July 2020. ? ? ? ? ? ?Interim History: Nicole Campbell is here for a follow-up visit.  Since last visit, she reports feeling well without any major complaints.  She denies any fevers, chills or sweats.  She denies any hospitalizations.  She did have 1 episode of acute illness and emesis last month but has resolved.  She has reported some irritation in her eye at this time and was diagnosed with conjunctivitis.  No bleeding or bruising complications. ? ? ? ? ? ?. ? ? ? ?Medications: Reviewed without changes. ?Current Outpatient Medications  ?Medication Sig Dispense Refill  ? acetaminophen (TYLENOL) 650 MG CR tablet Take 1,300 mg by mouth every 8 (eight) hours as needed for pain.    ? escitalopram (LEXAPRO) 5 MG tablet Take 5 mg by mouth daily.    ? feeding supplement (BOOST HIGH PROTEIN) LIQD Take 1 Container by mouth daily with breakfast.    ? furosemide (LASIX) 20 MG tablet Take 1 tablet (20 mg total) by mouth daily as needed. 60 tablet 11  ? ibrutinib (IMBRUVICA) 420 MG tablet TAKE 1 TABLET (420 MG) BY MOUTH DAILY. 28 tablet 0  ? levothyroxine (SYNTHROID, LEVOTHROID) 25 MCG tablet Take 25 mcg by mouth daily before  breakfast.     ? Multiple Vitamin (MULTIVITAMIN WITH MINERALS) TABS tablet Take 1 tablet by mouth daily. 90 tablet 0  ? ondansetron (ZOFRAN-ODT) 4 MG disintegrating tablet Take 1 tablet (4 mg total) by mouth every 8 (eight) hours as needed for nausea or vomiting. 12 tablet 0  ? polyethylene glycol (MIRALAX / GLYCOLAX) 17 g packet Take 17 g by mouth daily as needed for moderate constipation. 14 each 0  ? potassium chloride (KLOR-CON M) 10 MEQ tablet Take 1 tablet (10 mEq total) by mouth daily as needed. Pease take along with lasix if needed for legs swelling. 30 tablet 0  ? ?No current facility-administered medications for this visit.  ? ? ? ?Allergies:  ?Allergies  ?Allergen Reactions  ? Methotrexate Other (See Comments)  ?  Other reaction(s): horrible lower back pain  ? Erythromycin Palpitations  ? ? ? ? ? ? ?Physical Exam: ? ? ? ? ? ? ? ? ?Blood pressure 136/62, pulse 69, temperature 97.8 ?F (36.6 ?C), temperature source Temporal, resp. rate 16, height 5' 6.5" (1.689 m), weight 153 lb (69.4 kg), SpO2 100 %. ? ? ? ? ?ECOG:1  ?  ? ?General appearance: Alert, awake without any distress. ?Head: Atraumatic without abnormalities ?Oropharynx: Without any thrush or ulcers. ?Eyes: No scleral icterus. ?Lymph nodes: No lymphadenopathy noted in the cervical, supraclavicular, or axillary nodes ?Heart:regular rate and rhythm, without any murmurs or gallops.   ?Lung: Clear to auscultation without any rhonchi, wheezes  or dullness to percussion. ?Abdomin: Soft, nontender without any shifting dullness or ascites. ?Musculoskeletal: No clubbing or cyanosis. ?Neurological: No motor or sensory deficits. ?Skin: No rashes or lesions. ? ? ? ? ? ? ? ? ? ? ? ? ? ? ? ? ? ? ? ? ? ? ?Lab Results: ?Lab Results  ?Component Value Date  ? WBC 24.6 (H) 06/28/2021  ? HGB 11.6 (L) 06/28/2021  ? HCT 36.1 06/28/2021  ? MCV 89.1 06/28/2021  ? PLT 432 (H) 06/28/2021  ? ?  Chemistry   ?   ?Component Value Date/Time  ? NA 134 (L) 06/28/2021 2239  ? K 4.1  06/28/2021 2239  ? CL 99 06/28/2021 2239  ? CO2 28 06/28/2021 2239  ? BUN 24 (H) 06/28/2021 2239  ? CREATININE 0.81 06/28/2021 2239  ? CREATININE 0.71 06/27/2021 1058  ?    ?Component Value Date/Time  ? CALCIUM 8.6 (L) 06/28/2021 2239  ? ALKPHOS 74 06/28/2021 2239  ? AST 17 06/28/2021 2239  ? AST 14 (L) 06/27/2021 1058  ? ALT 18 06/28/2021 2239  ? ALT 15 06/27/2021 1058  ? BILITOT 0.6 06/28/2021 2239  ? BILITOT 0.4 06/27/2021 1058  ?  ? ? ? ? ?76 year old woman with: ? ?1.  CLL diagnosed in 2019 after presenting with lymphocytosis and adenopathy.   ? ?She continues to be on ibrutinib without any major complications.  Risks and benefits of continuing this treatments were reiterated.  She continues to have reasonable hematological response at this time.  Laboratory data from today reviewed and showed a complete hematological response.  She is agreeable to continue at this time. ? ?2.  Rheumatoid arthritis: No recent exacerbation noted at this time. ? ?3.  Neutropenic fever: Resolved at this time without any residual complaints. ? ?4.  Anemia: Resolved with normalization of her hemoglobin. ? ?5.  Follow-up: In 3 months for repeat follow-up. ? ? ?30  minutes were dedicated to this visit.  The time was spent on reviewing laboratory data, disease status update and outlining future plan of care discussion all ? ? ?Nicole Button, MD ?3/23/20238:08 AM  ?

## 2021-08-02 ENCOUNTER — Other Ambulatory Visit (HOSPITAL_COMMUNITY): Payer: Self-pay

## 2021-08-02 ENCOUNTER — Other Ambulatory Visit: Payer: Self-pay | Admitting: Oncology

## 2021-08-02 DIAGNOSIS — C9110 Chronic lymphocytic leukemia of B-cell type not having achieved remission: Secondary | ICD-10-CM

## 2021-08-02 MED ORDER — IBRUTINIB 420 MG PO TABS
ORAL_TABLET | ORAL | 0 refills | Status: DC
  Filled 2021-08-02: qty 28, 28d supply, fill #0

## 2021-08-04 ENCOUNTER — Other Ambulatory Visit (HOSPITAL_COMMUNITY): Payer: Self-pay

## 2021-08-08 DIAGNOSIS — L03011 Cellulitis of right finger: Secondary | ICD-10-CM | POA: Diagnosis not present

## 2021-08-26 ENCOUNTER — Other Ambulatory Visit: Payer: Self-pay | Admitting: Oncology

## 2021-08-26 ENCOUNTER — Other Ambulatory Visit (HOSPITAL_COMMUNITY): Payer: Self-pay

## 2021-08-26 DIAGNOSIS — C9110 Chronic lymphocytic leukemia of B-cell type not having achieved remission: Secondary | ICD-10-CM

## 2021-08-26 MED ORDER — IBRUTINIB 420 MG PO TABS
ORAL_TABLET | ORAL | 0 refills | Status: DC
  Filled 2021-08-26: qty 28, 28d supply, fill #0

## 2021-08-31 ENCOUNTER — Other Ambulatory Visit (HOSPITAL_COMMUNITY): Payer: Self-pay

## 2021-08-31 DIAGNOSIS — Z86010 Personal history of colonic polyps: Secondary | ICD-10-CM | POA: Diagnosis not present

## 2021-08-31 DIAGNOSIS — R195 Other fecal abnormalities: Secondary | ICD-10-CM | POA: Diagnosis not present

## 2021-08-31 DIAGNOSIS — K649 Unspecified hemorrhoids: Secondary | ICD-10-CM | POA: Diagnosis not present

## 2021-09-13 ENCOUNTER — Ambulatory Visit: Payer: PPO | Admitting: Internal Medicine

## 2021-09-20 ENCOUNTER — Encounter: Payer: Self-pay | Admitting: Internal Medicine

## 2021-09-20 ENCOUNTER — Ambulatory Visit (INDEPENDENT_AMBULATORY_CARE_PROVIDER_SITE_OTHER): Payer: PPO | Admitting: Internal Medicine

## 2021-09-20 DIAGNOSIS — J90 Pleural effusion, not elsewhere classified: Secondary | ICD-10-CM

## 2021-09-20 NOTE — Patient Instructions (Addendum)
Follow up as needed if  note difficulty lying flat - aldactone  is still a good option (it does contain the "zide" that lowers sodium )  and does not usually lower your potassium

## 2021-09-20 NOTE — Progress Notes (Signed)
Nicole Campbell, female    DOB: [birth year],    MRN: [id number]   Brief patient profile:  75yowf never smoker with RA onset 2016 while in [place] and moved to [place] rx mtx and various other meds Nicole Campbell then noted elevated wbc Nov 2019 dx CLL observation rx intially then Rituxan started 07/2018 which helped the CLL > RA symptoms tried mtx again >> "horrible low back pain" with CT 12/12/2018 neg for pl effusion or ILD  better p pred rx then tapered off by Feb 1st covid shot Feb 12th 2021, then feb 20th 2021 new dry cough indolent onset present daily > March 12 eval at [facility] cxr showed fluid rec no rx per pt   referred to pulmonary clinic 08/07/2019 by Dr   Nicole Campbell  2nd covid  shot March 7th 2021 > rx 10 days abx for ? Infection at site  = doxycycline   History of Present Illness  08/07/2019  Pulmonary/ 1st office eval/Kaine Mcquillen RA pt with effusion but RA symptoms are better  Chief Complaint  Patient presents with   Consult    Referred by Dr. Carol Campbell for pleural effusion and cough. Non productive cough.   Dyspnea:  No MMRC2 = can't walk a nl pace on a flat grade s sob but does fine slow and flat [employer] Cough: dry/ worse with talking/ laughing  Sleep: avg 45 degrees x 6 months due to breathing better elevated SABA use: none Worse than usual swelling R > L first noted in 2016  rec Add:  No obvious dx with bilateral effusions likely worse on L > proceed with dx/therapeutic L t centesis  > pos for lymphoma     07/01/2020    ov/Nicole Campbell re: still 3 days left on levaquin, mucus is clear  Was slt green prior to levaquin Chief Complaint  Patient presents with   Acute Visit    Had covid 19 end of Dec 2021- She states has had cough since then. She has had SOB over the past 2 wks- currently on levaquin 500 per PCP for PNA. Her cough is occ prod with clear to yellow sputum.    Dyspnea: walking room to room inside house, no grocery in over a week  Cough: rattling /clear mucus  Sleeping:  almost upright x 2 m SABA use: none  02: none  Covid status:   vax x 3 last  12/29/19  rec For cough > hydromet  1-2 tsp every 4-6 hours Ordered R Tcentesis 07/05/2020 :  800 cc: neg for lymphoma/ more transudative features though borderline      Echo  09/08/2020   wnl    09/23/2020  f/u ov/Mirza Fessel re:  B pleural effusions/ peripheral  Edema some better on 50 mg hctz daily x one week Chief Complaint  Patient presents with   Follow-up    Continuing to have shortness of breath with exertion.   Dyspnea:  Some worse crossing parking lot/ [employer] Cough: much better on 100 mg bid gabapentin mostly daytime throat clearing Sleeping: still 45 degrees  SABA use: none 02: none  Covid status:   [employer] x 3  Rec Change back to aldactazide 25/25 one daily and weigh every days with target about 2 lbs less      11/05/2020  f/u ov/Nicole Campbell re:  Bilateral effusions, cough is gone  - stopped aldactazide completely and swelling returned  Dyspnea:  mainly when talk a lot/ still doing [employer] about the same pace  Cough: gone Sleeping: almost flat  SABA use: none  02: none Covid status:   vax x 4  Rec Please remember to go to the lab and x-ray department  for your tests - we will call you with the results when they are available. Please schedule a follow up office visit in 6 weeks, call sooner if needed with cxr on return      09/20/2021  f/u ov/Nicole Campbell re: bilateral effusions / lymphoma  maint on imbruvica   Chief Complaint  Patient presents with   Follow-up    Breathing is better.  Was in the hospital in February for Neutrapenic fever.  Cough is gone, has not had in a while.  Dyspnea:  none Cough: none  Sleeping: flat bed, 2 pillows  SABA use: none  02: none    No obvious day to day or daytime variability or assoc excess/ purulent sputum or mucus plugs or hemoptysis or cp or chest tightness, subjective wheeze or overt sinus or hb symptoms.   Sleeping flat fine  without nocturnal  or early am  exacerbation  of respiratory  c/o's or need for noct saba. Also denies any obvious fluctuation of symptoms with weather or environmental changes or other aggravating or alleviating factors except as outlined above   No unusual exposure hx or h/o childhood pna/ asthma or knowledge of premature birth.  Current Allergies, Complete Past Medical History, Past Surgical History, Family History, and Social History were reviewed in [employer] record.  ROS  The following are not active complaints unless bolded Hoarseness, sore throat, dysphagia, dental problems, itching, sneezing,  nasal congestion or discharge of excess mucus or purulent secretions, ear ache,   fever, chills, sweats, unintended wt loss or wt gain, classically pleuritic or exertional cp,  orthopnea pnd or arm/hand swelling  or leg swelling R> L  presyncope, palpitations, abdominal pain, anorexia, nausea, vomiting, diarrhea  or change in bowel habits or change in bladder habits, change in stools or change in urine, dysuria, hematuria,  rash, arthralgias, visual complaints, headache, numbness, weakness or ataxia or problems with walking or coordination,  change in mood or  memory.        Current Meds  Medication Sig   escitalopram (LEXAPRO) 5 MG tablet Take 5 mg by mouth daily.   feeding supplement (BOOST HIGH PROTEIN) LIQD Take 1 Container by mouth daily with breakfast.   ibrutinib (IMBRUVICA) 420 MG tablet TAKE 1 TABLET (420 MG) BY MOUTH DAILY.   levothyroxine (SYNTHROID, LEVOTHROID) 25 MCG tablet Take 25 mcg by mouth daily before breakfast.    Multiple Vitamin (MULTIVITAMIN ADULT PO) Take 1 tablet by mouth daily.              Past Medical History:  Diagnosis Date   CLL (chronic lymphocytic leukemia) ([place])    Hypertension    Hypothyroid    MVP (mitral valve prolapse)    Rheumatoid arthritis ([place])        Objective:    Wts  09/20/2021        156  03/16/2021      144   12/17/2020        141  11/05/2020           142 09/23/2020        151 08/26/2020        152 07/28/2020        144 07/12/2020        159  07/01/20 163 lb 6.4 oz (74.1  kg)  05/20/20 164 lb (74.4 kg)  03/17/20 163 lb (73.9 kg)     Vital signs reviewed  09/20/2021  - Note at rest 02 sats  98% on RA   General appearance:    amb wf all smiles    HEENT : Oropharynx  clear  Nasal turbintes nl   NECK :  without  appent JVD/ palpable Nodes/TM    LUNGS: no acc muscle use,  Nl contour chest which is clear to A and P bilaterally without cough on insp or exp maneuvers   CV:  RRR  no s3 or murmur or increase in P2, and  trace pitting edema  R > L  LE's  ABD:  soft and nontender with nl inspiratory excursion in the supine position. No bruits or organomegaly appreciated   MS:  Nl gait/ ext warm without deformities Or obvious joint restrictions  calf tenderness, cyanosis or clubbing     SKIN: warm and dry without lesions    NEURO:  Campbell, approp, nl sensorium with  no motor or cerebellar deficits apparent.              Assessment

## 2021-09-20 NOTE — Assessment & Plan Note (Signed)
Onset of symptoms Feb 2021  -R thoracentesis 08/12/2019 :  1 liter   Exudative, nl glucose,  11k lymphs > flow cyt c/w lymphoma > referred back to oncology and hold off on R tap as feeling much better as of 08/14/2019  - venous dopplers rec due to D dimer 0.95 >>>  08/14/2019 neg bilaterally  - R Tcentesis 07/05/2020 :  800 cc: neg for lymphoma/ more transudative features though borderline - 3/24 /22 start Aldactazide  25/25 one twice daily  - repeat cxr 07/28/2020 > improved so continue aldactazide 25/25 one daily > "too dry" - 08/26/2020 recurrent  R > L  efffusions off rx so restart hctz 25 mg daily  -  Echo  09/08/2020   wnl  - 09/23/2020 fluid building back up with R pseudotumor on 50 mg hctz daily so try restart aldactazide 25-25 one daily  - 11/05/2020 pseudotumor resolved but effusions persist - not taking aldactazide consistently  - 12/17/2020 fluid slt reduced on aldatazide 25/25 one every 3d day but slt increase in R > L leg edema  - 03/16/2021 improved on Aldactazide 25/25 "most days"   Pt reports had to stop aldactazide due to low na but not cc large KCl pills on lasix and could resume aldactone if needed but doing well encough at this point just to use prn lasix/K supplements as directed   Pulmonary f/u can be prn worse orthopnea as indicated.          Each maintenance medication was reviewed in detail including emphasizing most importantly the difference between maintenance and prns and under what circumstances the prns are to be triggered using an action plan format where appropriate.  Total time for H and P, chart review, counseling,  and generating customized AVS unique to this office visit / same day charting = 25 min

## 2021-09-21 ENCOUNTER — Other Ambulatory Visit: Payer: Self-pay | Admitting: Oncology

## 2021-09-21 ENCOUNTER — Other Ambulatory Visit (HOSPITAL_COMMUNITY): Payer: Self-pay

## 2021-09-21 DIAGNOSIS — C9110 Chronic lymphocytic leukemia of B-cell type not having achieved remission: Secondary | ICD-10-CM

## 2021-09-21 MED ORDER — IBRUTINIB 420 MG PO TABS
ORAL_TABLET | ORAL | 0 refills | Status: DC
  Filled 2021-09-21: qty 28, 28d supply, fill #0

## 2021-09-27 ENCOUNTER — Other Ambulatory Visit (HOSPITAL_COMMUNITY): Payer: Self-pay

## 2021-09-28 ENCOUNTER — Telehealth: Payer: Self-pay | Admitting: Oncology

## 2021-09-28 NOTE — Telephone Encounter (Signed)
Called patient regarding upcoming June appointments, patient is notified.  

## 2021-10-04 ENCOUNTER — Other Ambulatory Visit: Payer: Self-pay | Admitting: Nurse Practitioner

## 2021-10-20 ENCOUNTER — Encounter: Payer: Self-pay | Admitting: Internal Medicine

## 2021-10-21 ENCOUNTER — Inpatient Hospital Stay: Payer: PPO | Attending: Oncology

## 2021-10-21 ENCOUNTER — Other Ambulatory Visit: Payer: Self-pay | Admitting: Oncology

## 2021-10-21 ENCOUNTER — Inpatient Hospital Stay (HOSPITAL_BASED_OUTPATIENT_CLINIC_OR_DEPARTMENT_OTHER): Payer: PPO | Admitting: Oncology

## 2021-10-21 ENCOUNTER — Other Ambulatory Visit (HOSPITAL_COMMUNITY): Payer: Self-pay

## 2021-10-21 ENCOUNTER — Other Ambulatory Visit: Payer: Self-pay

## 2021-10-21 VITALS — BP 139/60 | HR 98 | Temp 97.6°F | Resp 18 | Ht 66.5 in | Wt 158.6 lb

## 2021-10-21 DIAGNOSIS — M069 Rheumatoid arthritis, unspecified: Secondary | ICD-10-CM | POA: Diagnosis not present

## 2021-10-21 DIAGNOSIS — C9110 Chronic lymphocytic leukemia of B-cell type not having achieved remission: Secondary | ICD-10-CM | POA: Diagnosis not present

## 2021-10-21 DIAGNOSIS — Z79899 Other long term (current) drug therapy: Secondary | ICD-10-CM | POA: Insufficient documentation

## 2021-10-21 DIAGNOSIS — C9111 Chronic lymphocytic leukemia of B-cell type in remission: Secondary | ICD-10-CM | POA: Insufficient documentation

## 2021-10-21 LAB — CMP (CANCER CENTER ONLY)
ALT: 17 U/L (ref 0–44)
AST: 17 U/L (ref 15–41)
Albumin: 3.9 g/dL (ref 3.5–5.0)
Alkaline Phosphatase: 65 U/L (ref 38–126)
Anion gap: 4 — ABNORMAL LOW (ref 5–15)
BUN: 25 mg/dL — ABNORMAL HIGH (ref 8–23)
CO2: 30 mmol/L (ref 22–32)
Calcium: 9.4 mg/dL (ref 8.9–10.3)
Chloride: 106 mmol/L (ref 98–111)
Creatinine: 0.89 mg/dL (ref 0.44–1.00)
GFR, Estimated: >60 mL/min (ref >60)
Glucose, Bld: 92 mg/dL (ref 70–99)
Potassium: 4.5 mmol/L (ref 3.5–5.1)
Sodium: 140 mmol/L (ref 135–145)
Total Bilirubin: 0.5 mg/dL (ref 0.3–1.2)
Total Protein: 5.8 g/dL — ABNORMAL LOW (ref 6.5–8.1)

## 2021-10-21 LAB — CBC WITH DIFFERENTIAL (CANCER CENTER ONLY)
Abs Immature Granulocytes: 0.01 10*3/uL (ref 0.00–0.07)
Basophils Absolute: 0.1 10*3/uL (ref 0.0–0.1)
Basophils Relative: 1 %
Eosinophils Absolute: 0.7 10*3/uL — ABNORMAL HIGH (ref 0.0–0.5)
Eosinophils Relative: 11 %
HCT: 38.6 % (ref 36.0–46.0)
Hemoglobin: 12.8 g/dL (ref 12.0–15.0)
Immature Granulocytes: 0 %
Lymphocytes Relative: 28 %
Lymphs Abs: 1.8 10*3/uL (ref 0.7–4.0)
MCH: 30 pg (ref 26.0–34.0)
MCHC: 33.2 g/dL (ref 30.0–36.0)
MCV: 90.4 fL (ref 80.0–100.0)
Monocytes Absolute: 0.9 10*3/uL (ref 0.1–1.0)
Monocytes Relative: 15 %
Neutro Abs: 2.7 10*3/uL (ref 1.7–7.7)
Neutrophils Relative %: 45 %
Platelet Count: 186 10*3/uL (ref 150–400)
RBC: 4.27 MIL/uL (ref 3.87–5.11)
RDW: 12.9 % (ref 11.5–15.5)
WBC Count: 6.3 10*3/uL (ref 4.0–10.5)
nRBC: 0 % (ref 0.0–0.2)

## 2021-10-21 MED ORDER — IBRUTINIB 420 MG PO TABS
ORAL_TABLET | ORAL | 0 refills | Status: DC
  Filled 2021-10-21: qty 28, 28d supply, fill #0

## 2021-10-21 MED ORDER — SPIRONOLACTONE 25 MG PO TABS: 25.0000 mg | ORAL_TABLET | Freq: Every day | ORAL | 2 refills | Status: DC

## 2021-10-28 ENCOUNTER — Encounter: Payer: Self-pay | Admitting: Oncology

## 2021-10-28 ENCOUNTER — Emergency Department (HOSPITAL_BASED_OUTPATIENT_CLINIC_OR_DEPARTMENT_OTHER)
Admission: EM | Admit: 2021-10-28 | Discharge: 2021-10-28 | Disposition: A | Discharge status: Home / Self Care | Payer: PPO | Attending: Emergency Medicine | Admitting: Emergency Medicine

## 2021-10-28 ENCOUNTER — Other Ambulatory Visit (HOSPITAL_BASED_OUTPATIENT_CLINIC_OR_DEPARTMENT_OTHER): Payer: Self-pay

## 2021-10-28 ENCOUNTER — Other Ambulatory Visit (HOSPITAL_COMMUNITY): Payer: Self-pay

## 2021-10-28 ENCOUNTER — Other Ambulatory Visit: Payer: Self-pay

## 2021-10-28 ENCOUNTER — Encounter (HOSPITAL_BASED_OUTPATIENT_CLINIC_OR_DEPARTMENT_OTHER): Payer: Self-pay | Admitting: Emergency Medicine

## 2021-10-28 DIAGNOSIS — L03115 Cellulitis of right lower limb: Secondary | ICD-10-CM | POA: Insufficient documentation

## 2021-10-28 DIAGNOSIS — M7989 Other specified soft tissue disorders: Secondary | ICD-10-CM | POA: Diagnosis present

## 2021-10-28 DIAGNOSIS — L0390 Cellulitis, unspecified: Secondary | ICD-10-CM

## 2021-10-28 MED ORDER — CLINDAMYCIN HCL 300 MG PO CAPS
300.0000 mg | ORAL_CAPSULE | Freq: Three times a day (TID) | ORAL | 0 refills | Status: AC
  Filled 2021-10-28: qty 30, 10d supply, fill #0

## 2021-10-28 NOTE — ED Triage Notes (Signed)
Patient presents to ED via POV from home. Here with spider bite to inner aspect of right lower leg. Skin it taut, red and inflamed. Patient reports drainage from site.

## 2021-10-28 NOTE — ED Provider Notes (Signed)
[facility] EMERGENCY DEPARTMENT Provider Note   CSN: [id number] Arrival date & time: 10/28/21  1035     History  Chief Complaint  Patient presents with   Insect Bite    Nicole Campbell is a 76 y.o. female.  Patient here with concern for infection to the right lower leg.  Thinks that she is bit by an insect and now has infectious process.  History of CLL on chronic immunotherapy.  Has had bulla and bad infections locally in her legs from prior insect bites.  She has been using topical antibiotics with improvement.  She has been using antibiotic dressing as well.  Denies any fevers or chills.  Bug bite has been there for the last 10 to 12 days.  Nothing makes it worse or better.  No other systemic symptoms.  The history is provided by the patient.       Home Medications Prior to Admission medications   Medication Sig Start Date End Date Taking? Authorizing Provider  clindamycin (CLEOCIN) 300 MG capsule Take 1 capsule (300 mg total) by mouth 3 (three) times daily for 10 days. 10/28/21 11/07/21 Yes Nicole [place]burg, DO  acetaminophen (TYLENOL) 650 MG CR tablet Take 1,300 mg by mouth every 8 (eight) hours as needed for pain.    [provider]  escitalopram (LEXAPRO) 5 MG tablet Take 5 mg by mouth daily. 12/15/20   [provider]  feeding supplement (BOOST HIGH PROTEIN) LIQD Take 1 Container by mouth daily with breakfast.    [provider]  furosemide (LASIX) 20 MG tablet Take 1 tablet (20 mg total) by mouth daily as needed. 06/20/21 07/20/21  Nicole Memos, DO  ibrutinib (IMBRUVICA) 420 MG tablet TAKE 1 TABLET (420 MG) BY MOUTH DAILY. 06/27/21 06/27/22  Nicole Memos, DO  ibrutinib (IMBRUVICA) 420 MG tablet TAKE 1 TABLET (420 MG) BY MOUTH DAILY. 10/21/21 10/21/22  Nicole Portela, MD  levothyroxine (SYNTHROID, LEVOTHROID) 25 MCG tablet Take 25 mcg by mouth daily before breakfast.  04/30/17   [provider]  Multiple Vitamin (MULTIVITAMIN  ADULT PO) Take 1 tablet by mouth daily.    [provider]  potassium chloride (KLOR-CON M) 10 MEQ tablet Take 1 tablet (10 mEq total) by mouth daily as needed. Pease take along with lasix if needed for legs swelling. 06/20/21 07/20/21  Nicole Memos, DO  spironolactone (ALDACTONE) 25 MG tablet Take 1 tablet (25 mg total) by mouth daily. 10/21/21   Nicole Rockers, MD      Allergies    Methotrexate and Erythromycin    Review of Systems   Review of Systems  Physical Exam Updated Vital Signs BP (!) 153/62 (BP Location: Left Arm)   Pulse 75   Temp 98.4 F (36.9 C) (Oral)   Resp 18   SpO2 100%  Physical Exam Vitals and nursing note reviewed.  Constitutional:      General: She is not in acute distress.    Appearance: She is well-developed.  HENT:     Head: Normocephalic and atraumatic.  Eyes:     Conjunctiva/sclera: Conjunctivae normal.  Cardiovascular:     Rate and Rhythm: Normal rate and regular rhythm.     Heart sounds: No murmur heard. Pulmonary:     Effort: Pulmonary effort is normal. No respiratory distress.     Breath sounds: Normal breath sounds.  Abdominal:     Palpations: Abdomen is soft.     Tenderness: There is no abdominal tenderness.  Musculoskeletal:        General: No swelling.     Cervical back: Neck supple.  Skin:    General: Skin is warm and dry.     Capillary Refill: Capillary refill takes less than 2 seconds.     Findings: Erythema present.     Comments: Patient has about a 5 x 5 circular lesion to the right shin with surrounding erythema but no purulent drainage  Neurological:     Mental Status: She is alert.  Psychiatric:        Mood and Affect: Mood normal.     ED Results / Procedures / Treatments   Labs (all labs ordered are listed, but only abnormal results are displayed) Labs Reviewed - No data to display  EKG None  Radiology No results found.  Procedures Procedures    Medications Ordered in ED Medications - No data to  display  ED Course/ Medical Decision Making/ A&P                           Medical Decision Making Risk Prescription drug management.   Nicole Campbell is here with right lower leg wound.  Normal vitals.  No fever.  History of CLL on chronic immunotherapy.  History of infections from bug bites in the past.  She thinks that she was bit by something about 10 to 12 days ago.  Has been using topical antibiotics antibiotic dressing with some improvement.  She has some mild erythema around a 5 x 5 circular area of infection.  Seems that she had a bulla that has now popped.  Overall think that there is likely a cellulitis surrounding this process.  This is likely an infected insect bite.  Will start her on oral antibiotics and have her continue topical antibiotics and have her follow-up for wound recheck in 48 hours to primary care doctor.  Discharged in good condition.  No concern for systemic process at this time.  This chart was dictated using voice recognition software.  Despite best efforts to proofread,  errors can occur which can change the documentation meaning.         Final Clinical Impression(s) / ED Diagnoses Final diagnoses:  Cellulitis, unspecified cellulitis site    Rx / DC Orders ED Discharge Orders          Ordered    clindamycin (CLEOCIN) 300 MG capsule  3 times daily        10/28/21 Campbell, [place], DO 10/28/21 1136

## 2021-11-03 ENCOUNTER — Telehealth: Payer: Self-pay | Admitting: Pharmacy Technician

## 2021-11-03 ENCOUNTER — Other Ambulatory Visit (HOSPITAL_COMMUNITY): Payer: Self-pay

## 2021-11-03 NOTE — Telephone Encounter (Signed)
Oral Oncology Patient Advocate Encounter  Was successful in securing patient a $8,000 grant from [employer] to provide copayment coverage for Imbruvica.  This will keep the out of pocket expense at $0.     [employer] ID: [id number]    The billing information is as follows and has been shared with [facility].    RxBin: [id number] PCN: [id number] Member ID: [id number] Group ID: [id number]  Dates of Eligibility: 10/18/21 through 10/18/2022  Fund:  [place], CPhT-Adv Pharmacy Patient Advocate Specialist  Patient Advocate Team Direct Number: [phone]  Fax: [phone]

## 2021-11-09 ENCOUNTER — Telehealth: Payer: Self-pay | Admitting: Oncology

## 2021-11-09 NOTE — Telephone Encounter (Signed)
Called patient regarding upcoming September appointments, patient is notified.  

## 2021-11-10 DIAGNOSIS — E039 Hypothyroidism, unspecified: Secondary | ICD-10-CM | POA: Diagnosis not present

## 2021-11-10 DIAGNOSIS — E7800 Pure hypercholesterolemia, unspecified: Secondary | ICD-10-CM | POA: Diagnosis not present

## 2021-11-10 DIAGNOSIS — Z1331 Encounter for screening for depression: Secondary | ICD-10-CM | POA: Diagnosis not present

## 2021-11-10 DIAGNOSIS — R238 Other skin changes: Secondary | ICD-10-CM | POA: Diagnosis not present

## 2021-11-10 DIAGNOSIS — C9110 Chronic lymphocytic leukemia of B-cell type not having achieved remission: Secondary | ICD-10-CM | POA: Diagnosis not present

## 2021-11-10 DIAGNOSIS — F419 Anxiety disorder, unspecified: Secondary | ICD-10-CM | POA: Diagnosis not present

## 2021-11-10 DIAGNOSIS — Z0000 Encounter for general adult medical examination without abnormal findings: Secondary | ICD-10-CM | POA: Diagnosis not present

## 2021-11-11 ENCOUNTER — Encounter: Payer: Self-pay | Admitting: Oncology

## 2021-11-11 ENCOUNTER — Other Ambulatory Visit: Payer: Self-pay

## 2021-11-11 DIAGNOSIS — C9110 Chronic lymphocytic leukemia of B-cell type not having achieved remission: Secondary | ICD-10-CM

## 2021-11-11 NOTE — Progress Notes (Signed)
ambu

## 2021-11-22 ENCOUNTER — Other Ambulatory Visit (HOSPITAL_COMMUNITY): Payer: Self-pay

## 2021-11-23 ENCOUNTER — Other Ambulatory Visit: Payer: Self-pay | Admitting: Oncology

## 2021-11-23 ENCOUNTER — Other Ambulatory Visit (HOSPITAL_COMMUNITY): Payer: Self-pay

## 2021-11-23 DIAGNOSIS — C9110 Chronic lymphocytic leukemia of B-cell type not having achieved remission: Secondary | ICD-10-CM

## 2021-11-23 MED ORDER — IBRUTINIB 420 MG PO TABS
ORAL_TABLET | ORAL | 0 refills | Status: DC
  Filled 2021-11-23: qty 28, 28d supply, fill #0

## 2021-11-25 ENCOUNTER — Other Ambulatory Visit (HOSPITAL_COMMUNITY): Payer: Self-pay

## 2021-11-28 DIAGNOSIS — Z09 Encounter for follow-up examination after completed treatment for conditions other than malignant neoplasm: Secondary | ICD-10-CM | POA: Diagnosis not present

## 2021-11-28 DIAGNOSIS — K648 Other hemorrhoids: Secondary | ICD-10-CM | POA: Diagnosis not present

## 2021-11-28 DIAGNOSIS — Q438 Other specified congenital malformations of intestine: Secondary | ICD-10-CM | POA: Diagnosis not present

## 2021-11-28 DIAGNOSIS — Z86010 Personal history of colonic polyps: Secondary | ICD-10-CM | POA: Diagnosis not present

## 2021-11-28 DIAGNOSIS — K635 Polyp of colon: Secondary | ICD-10-CM | POA: Diagnosis not present

## 2021-11-28 DIAGNOSIS — Z800 Family history of malignant neoplasm of digestive organs: Secondary | ICD-10-CM | POA: Diagnosis not present

## 2021-11-30 DIAGNOSIS — K635 Polyp of colon: Secondary | ICD-10-CM | POA: Diagnosis not present

## 2021-12-12 ENCOUNTER — Other Ambulatory Visit (HOSPITAL_COMMUNITY): Payer: Self-pay

## 2021-12-23 ENCOUNTER — Other Ambulatory Visit: Payer: Self-pay | Admitting: Oncology

## 2021-12-23 ENCOUNTER — Other Ambulatory Visit (HOSPITAL_COMMUNITY): Payer: Self-pay

## 2021-12-23 DIAGNOSIS — C9110 Chronic lymphocytic leukemia of B-cell type not having achieved remission: Secondary | ICD-10-CM

## 2021-12-23 MED ORDER — IBRUTINIB 420 MG PO TABS
ORAL_TABLET | ORAL | 0 refills | Status: DC
  Filled 2021-12-23: qty 28, 28d supply, fill #0

## 2021-12-26 ENCOUNTER — Other Ambulatory Visit (HOSPITAL_COMMUNITY): Payer: Self-pay

## 2021-12-27 ENCOUNTER — Other Ambulatory Visit (HOSPITAL_COMMUNITY): Payer: Self-pay

## 2022-01-05 DIAGNOSIS — H0288A Meibomian gland dysfunction right eye, upper and lower eyelids: Secondary | ICD-10-CM | POA: Diagnosis not present

## 2022-01-05 DIAGNOSIS — Z961 Presence of intraocular lens: Secondary | ICD-10-CM | POA: Diagnosis not present

## 2022-01-05 DIAGNOSIS — H35371 Puckering of macula, right eye: Secondary | ICD-10-CM | POA: Diagnosis not present

## 2022-01-05 DIAGNOSIS — H04123 Dry eye syndrome of bilateral lacrimal glands: Secondary | ICD-10-CM | POA: Diagnosis not present

## 2022-01-05 DIAGNOSIS — D3131 Benign neoplasm of right choroid: Secondary | ICD-10-CM | POA: Diagnosis not present

## 2022-01-05 DIAGNOSIS — H43811 Vitreous degeneration, right eye: Secondary | ICD-10-CM | POA: Diagnosis not present

## 2022-01-05 DIAGNOSIS — H0288B Meibomian gland dysfunction left eye, upper and lower eyelids: Secondary | ICD-10-CM | POA: Diagnosis not present

## 2022-01-09 DIAGNOSIS — R238 Other skin changes: Secondary | ICD-10-CM | POA: Diagnosis not present

## 2022-01-17 ENCOUNTER — Other Ambulatory Visit (HOSPITAL_COMMUNITY): Payer: Self-pay

## 2022-01-17 ENCOUNTER — Other Ambulatory Visit: Payer: Self-pay | Admitting: Oncology

## 2022-01-17 DIAGNOSIS — C9110 Chronic lymphocytic leukemia of B-cell type not having achieved remission: Secondary | ICD-10-CM

## 2022-01-17 MED ORDER — IBRUTINIB 420 MG PO TABS
ORAL_TABLET | ORAL | 0 refills | Status: DC
  Filled 2022-01-17: qty 28, 28d supply, fill #0

## 2022-01-20 ENCOUNTER — Ambulatory Visit: Payer: PPO | Admitting: Oncology

## 2022-01-20 ENCOUNTER — Other Ambulatory Visit: Payer: PPO

## 2022-01-26 ENCOUNTER — Other Ambulatory Visit (HOSPITAL_COMMUNITY): Payer: Self-pay

## 2022-01-27 ENCOUNTER — Other Ambulatory Visit: Payer: Self-pay

## 2022-01-27 ENCOUNTER — Inpatient Hospital Stay (HOSPITAL_BASED_OUTPATIENT_CLINIC_OR_DEPARTMENT_OTHER): Payer: PPO | Admitting: Oncology

## 2022-01-27 ENCOUNTER — Inpatient Hospital Stay: Payer: PPO | Attending: Oncology

## 2022-01-27 VITALS — BP 154/63 | HR 65 | Temp 97.0°F | Resp 14 | Wt 164.3 lb

## 2022-01-27 DIAGNOSIS — M069 Rheumatoid arthritis, unspecified: Secondary | ICD-10-CM | POA: Diagnosis not present

## 2022-01-27 DIAGNOSIS — L139 Bullous disorder, unspecified: Secondary | ICD-10-CM | POA: Insufficient documentation

## 2022-01-27 DIAGNOSIS — C9110 Chronic lymphocytic leukemia of B-cell type not having achieved remission: Secondary | ICD-10-CM

## 2022-01-27 DIAGNOSIS — Z79899 Other long term (current) drug therapy: Secondary | ICD-10-CM | POA: Insufficient documentation

## 2022-01-27 LAB — CMP (CANCER CENTER ONLY)
ALT: 15 U/L (ref 0–44)
AST: 15 U/L (ref 15–41)
Albumin: 3.9 g/dL (ref 3.5–5.0)
Alkaline Phosphatase: 65 U/L (ref 38–126)
Anion gap: 2 — ABNORMAL LOW (ref 5–15)
BUN: 20 mg/dL (ref 8–23)
CO2: 32 mmol/L (ref 22–32)
Calcium: 9.1 mg/dL (ref 8.9–10.3)
Chloride: 107 mmol/L (ref 98–111)
Creatinine: 0.81 mg/dL (ref 0.44–1.00)
GFR, Estimated: >60 mL/min (ref >60)
Glucose, Bld: 78 mg/dL (ref 70–99)
Potassium: 4.2 mmol/L (ref 3.5–5.1)
Sodium: 141 mmol/L (ref 135–145)
Total Bilirubin: 0.7 mg/dL (ref 0.3–1.2)
Total Protein: 6 g/dL — ABNORMAL LOW (ref 6.5–8.1)

## 2022-01-27 LAB — CBC WITH DIFFERENTIAL (CANCER CENTER ONLY)
Abs Immature Granulocytes: 0.02 10*3/uL (ref 0.00–0.07)
Basophils Absolute: 0.1 10*3/uL (ref 0.0–0.1)
Basophils Relative: 2 %
Eosinophils Absolute: 0.5 10*3/uL (ref 0.0–0.5)
Eosinophils Relative: 8 %
HCT: 39 % (ref 36.0–46.0)
Hemoglobin: 13 g/dL (ref 12.0–15.0)
Immature Granulocytes: 0 %
Lymphocytes Relative: 34 %
Lymphs Abs: 1.9 10*3/uL (ref 0.7–4.0)
MCH: 30.7 pg (ref 26.0–34.0)
MCHC: 33.3 g/dL (ref 30.0–36.0)
MCV: 92 fL (ref 80.0–100.0)
Monocytes Absolute: 0.8 10*3/uL (ref 0.1–1.0)
Monocytes Relative: 14 %
Neutro Abs: 2.4 10*3/uL (ref 1.7–7.7)
Neutrophils Relative %: 42 %
Platelet Count: 188 10*3/uL (ref 150–400)
RBC: 4.24 MIL/uL (ref 3.87–5.11)
RDW: 12.7 % (ref 11.5–15.5)
WBC Count: 5.8 10*3/uL (ref 4.0–10.5)
nRBC: 0 % (ref 0.0–0.2)

## 2022-01-27 NOTE — Progress Notes (Signed)
Hematology and Oncology Follow Up Visit  Nicole Campbell [id number] January 19, 1946 76 y.o. 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  Prior therapy:   She is status post rituximab weekly for 4 weeks completed on August 21, 2018. She is status post thoracentesis completed on August 12, 2019 with positive cytology. Rituximab 375 mg per metered square weekly started on October 13 of 2020 and completed on 03/04/2019.  This will be repeated every 3 months last treatment completed in March 2021. Bendamustine and rituximab started on Sep 03, 2019.  She is here for cycle 3 of therapy.  Current therapy: Ibrutinib 420 mg daily started in July 2020.      Interim History: Nicole Campbell presents today for a follow-up visit.  Since last visit, she reports more dermatological issues predominantly blisters noted on her upper and lower extremities.  These have been intermittent and occurring in the last 2 to 3 years which could have coincided with ibrutinib.  She was evaluated by dermatology although she declined a biopsy.  She denies any other complications related to ibrutinib.  She denies any nausea vomiting or lymphadenopathy.  She denies any hospitalizations or illnesses.      .    Medications: Reviewed without changes. Current Outpatient Medications  Medication Sig Dispense Refill   acetaminophen (TYLENOL) 650 MG CR tablet Take 1,300 mg by mouth every 8 (eight) hours as needed for pain.     escitalopram (LEXAPRO) 5 MG tablet Take 5 mg by mouth daily.     feeding supplement (BOOST HIGH PROTEIN) LIQD Take 1 Container by mouth daily with breakfast.     furosemide (LASIX) 20 MG tablet Take 1 tablet (20 mg total) by mouth daily as needed. 60 tablet 11   ibrutinib (IMBRUVICA) 420 MG tablet TAKE 1 TABLET (420 MG) BY MOUTH DAILY. 28 tablet 0   ibrutinib (IMBRUVICA) 420 MG  tablet TAKE 1 TABLET (420 MG) BY MOUTH DAILY. 28 tablet 0   levothyroxine (SYNTHROID, LEVOTHROID) 25 MCG tablet Take 25 mcg by mouth daily before breakfast.      Multiple Vitamin (MULTIVITAMIN ADULT PO) Take 1 tablet by mouth daily.     potassium chloride (KLOR-CON M) 10 MEQ tablet Take 1 tablet (10 mEq total) by mouth daily as needed. Pease take along with lasix if needed for legs swelling. 30 tablet 0   spironolactone (ALDACTONE) 25 MG tablet Take 1 tablet (25 mg total) by mouth daily. 30 tablet 2   No current facility-administered medications for this visit.     Allergies:  Allergies  Allergen Reactions   Methotrexate Other (See Comments)    Other reaction(s): horrible lower back pain   Erythromycin Palpitations        Physical Exam:       ECOG:1     General appearance: Alert, awake without any distress. Head: Atraumatic without abnormalities Oropharynx: Without any thrush or ulcers. Eyes: No scleral icterus. Lymph nodes: No lymphadenopathy noted in the cervical, supraclavicular, or axillary nodes Heart:regular rate and rhythm, without any murmurs or gallops.   Lung: Clear to auscultation without any rhonchi, wheezes or dullness to percussion. Abdomin: Soft, nontender without any shifting dullness or ascites. Musculoskeletal: No clubbing or cyanosis. Neurological: No motor or sensory deficits. Skin: Bullous lesion noted on her left upper extremity and right foot.  Mild erythema noted.  Lab Results: Lab Results  Component Value Date   WBC 6.3 10/21/2021   HGB 12.8 10/21/2021   HCT 38.6 10/21/2021   MCV 90.4 10/21/2021   PLT 186 10/21/2021     Chemistry      Component Value Date/Time   NA 140 10/21/2021 1253   K 4.5 10/21/2021 1253   CL 106 10/21/2021 1253   CO2 30 10/21/2021 1253   BUN 25 (H) 10/21/2021 1253   CREATININE 0.89 10/21/2021 1253      Component Value Date/Time   CALCIUM 9.4 10/21/2021 1253    ALKPHOS 65 10/21/2021 1253   AST 17 10/21/2021 1253   ALT 17 10/21/2021 1253   BILITOT 0.5 10/21/2021 2163        76 year old woman with:  1.  CLL diagnosed in 2019 after presenting with adenopathy and lymphocytosis.   Her disease status was updated at this time and treatment choices were reviewed.  She has a complete response doing well improved now although she may be experiencing dermatological toxicities.  Bullous pemphigoid is a possibility due to ibrutinib.  Treatment options moving forward were discussed including switching to Calquence or dose reduction of ibrutinib.  After discussion today, she elected to take ibrutinib every other day as a dose reduction and will evaluate in 2 months.  Potentially we can switch to Calquence if she has no significant improvement in her symptoms.  2.  Rheumatoid arthritis: No exacerbation noted at this time.  3.  Bullous lesions on her upper and lower extremities: Bullous pemphigoid is a possibility as the consequences of improvement.  Continues to follow with dermatology and will make steps to titrate her off ibrutinib and possible switch to Calquence.  4.  Follow-up: In 2 months for follow-up.   30  minutes were dedicated to this visit.  The time was spent on updating disease status, treatment choices and outlining future plan of care review.  Zola Button, MD 9/29/202312:52 PM

## 2022-02-06 ENCOUNTER — Other Ambulatory Visit: Payer: Self-pay | Admitting: Family Medicine

## 2022-02-06 DIAGNOSIS — Z1231 Encounter for screening mammogram for malignant neoplasm of breast: Secondary | ICD-10-CM

## 2022-02-14 ENCOUNTER — Other Ambulatory Visit (HOSPITAL_COMMUNITY): Payer: Self-pay

## 2022-02-18 DIAGNOSIS — H1031 Unspecified acute conjunctivitis, right eye: Secondary | ICD-10-CM | POA: Diagnosis not present

## 2022-03-01 DIAGNOSIS — H04123 Dry eye syndrome of bilateral lacrimal glands: Secondary | ICD-10-CM | POA: Diagnosis not present

## 2022-03-01 DIAGNOSIS — H0288B Meibomian gland dysfunction left eye, upper and lower eyelids: Secondary | ICD-10-CM | POA: Diagnosis not present

## 2022-03-01 DIAGNOSIS — H43811 Vitreous degeneration, right eye: Secondary | ICD-10-CM | POA: Diagnosis not present

## 2022-03-01 DIAGNOSIS — H5711 Ocular pain, right eye: Secondary | ICD-10-CM | POA: Diagnosis not present

## 2022-03-01 DIAGNOSIS — H35371 Puckering of macula, right eye: Secondary | ICD-10-CM | POA: Diagnosis not present

## 2022-03-01 DIAGNOSIS — H0288A Meibomian gland dysfunction right eye, upper and lower eyelids: Secondary | ICD-10-CM | POA: Diagnosis not present

## 2022-03-01 DIAGNOSIS — D3131 Benign neoplasm of right choroid: Secondary | ICD-10-CM | POA: Diagnosis not present

## 2022-03-08 ENCOUNTER — Other Ambulatory Visit (HOSPITAL_COMMUNITY): Payer: Self-pay

## 2022-03-08 ENCOUNTER — Other Ambulatory Visit: Payer: Self-pay | Admitting: Oncology

## 2022-03-08 DIAGNOSIS — C9110 Chronic lymphocytic leukemia of B-cell type not having achieved remission: Secondary | ICD-10-CM

## 2022-03-08 MED ORDER — IBRUTINIB 420 MG PO TABS
ORAL_TABLET | ORAL | 0 refills | Status: DC
  Filled 2022-03-08: qty 28, 28d supply, fill #0

## 2022-03-15 ENCOUNTER — Ambulatory Visit
Admission: RE | Admit: 2022-03-15 | Discharge: 2022-03-15 | Disposition: A | Discharge status: Home / Self Care | Payer: PPO | Source: Ambulatory Visit | Attending: Family Medicine | Admitting: Family Medicine

## 2022-03-15 DIAGNOSIS — Z1231 Encounter for screening mammogram for malignant neoplasm of breast: Secondary | ICD-10-CM

## 2022-03-18 ENCOUNTER — Telehealth: Payer: Self-pay | Admitting: Oncology

## 2022-03-18 NOTE — Telephone Encounter (Signed)
Called patient regarding upcoming December appointments, patient is notified. 

## 2022-03-27 ENCOUNTER — Other Ambulatory Visit (HOSPITAL_COMMUNITY): Payer: Self-pay

## 2022-03-31 ENCOUNTER — Other Ambulatory Visit: Payer: PPO

## 2022-03-31 ENCOUNTER — Ambulatory Visit: Payer: PPO | Admitting: Oncology

## 2022-04-05 DIAGNOSIS — H0288A Meibomian gland dysfunction right eye, upper and lower eyelids: Secondary | ICD-10-CM | POA: Diagnosis not present

## 2022-04-05 DIAGNOSIS — H0288B Meibomian gland dysfunction left eye, upper and lower eyelids: Secondary | ICD-10-CM | POA: Diagnosis not present

## 2022-04-05 DIAGNOSIS — H04123 Dry eye syndrome of bilateral lacrimal glands: Secondary | ICD-10-CM | POA: Diagnosis not present

## 2022-04-05 DIAGNOSIS — D3131 Benign neoplasm of right choroid: Secondary | ICD-10-CM | POA: Diagnosis not present

## 2022-04-05 DIAGNOSIS — H5711 Ocular pain, right eye: Secondary | ICD-10-CM | POA: Diagnosis not present

## 2022-04-05 DIAGNOSIS — H35371 Puckering of macula, right eye: Secondary | ICD-10-CM | POA: Diagnosis not present

## 2022-04-05 DIAGNOSIS — H43811 Vitreous degeneration, right eye: Secondary | ICD-10-CM | POA: Diagnosis not present

## 2022-04-06 ENCOUNTER — Inpatient Hospital Stay (HOSPITAL_BASED_OUTPATIENT_CLINIC_OR_DEPARTMENT_OTHER): Payer: PPO | Admitting: Oncology

## 2022-04-06 ENCOUNTER — Inpatient Hospital Stay: Payer: PPO | Attending: Oncology

## 2022-04-06 VITALS — BP 137/53 | HR 64 | Temp 97.8°F | Resp 16 | Ht 66.5 in | Wt 166.0 lb

## 2022-04-06 DIAGNOSIS — C9110 Chronic lymphocytic leukemia of B-cell type not having achieved remission: Secondary | ICD-10-CM | POA: Insufficient documentation

## 2022-04-06 DIAGNOSIS — M069 Rheumatoid arthritis, unspecified: Secondary | ICD-10-CM | POA: Diagnosis not present

## 2022-04-06 DIAGNOSIS — Z79899 Other long term (current) drug therapy: Secondary | ICD-10-CM | POA: Insufficient documentation

## 2022-04-06 LAB — CBC WITH DIFFERENTIAL (CANCER CENTER ONLY)
Abs Immature Granulocytes: 0.02 10*3/uL (ref 0.00–0.07)
Basophils Absolute: 0.1 10*3/uL (ref 0.0–0.1)
Basophils Relative: 1 %
Eosinophils Absolute: 0.4 10*3/uL (ref 0.0–0.5)
Eosinophils Relative: 6 %
HCT: 38.3 % (ref 36.0–46.0)
Hemoglobin: 12.6 g/dL (ref 12.0–15.0)
Immature Granulocytes: 0 %
Lymphocytes Relative: 39 %
Lymphs Abs: 2.3 10*3/uL (ref 0.7–4.0)
MCH: 30.4 pg (ref 26.0–34.0)
MCHC: 32.9 g/dL (ref 30.0–36.0)
MCV: 92.5 fL (ref 80.0–100.0)
Monocytes Absolute: 0.7 10*3/uL (ref 0.1–1.0)
Monocytes Relative: 12 %
Neutro Abs: 2.5 10*3/uL (ref 1.7–7.7)
Neutrophils Relative %: 42 %
Platelet Count: 222 10*3/uL (ref 150–400)
RBC: 4.14 MIL/uL (ref 3.87–5.11)
RDW: 13.2 % (ref 11.5–15.5)
WBC Count: 5.9 10*3/uL (ref 4.0–10.5)
nRBC: 0 % (ref 0.0–0.2)

## 2022-04-06 LAB — CMP (CANCER CENTER ONLY)
ALT: 16 U/L (ref 0–44)
AST: 17 U/L (ref 15–41)
Albumin: 3.7 g/dL (ref 3.5–5.0)
Alkaline Phosphatase: 73 U/L (ref 38–126)
Anion gap: 4 — ABNORMAL LOW (ref 5–15)
BUN: 18 mg/dL (ref 8–23)
CO2: 32 mmol/L (ref 22–32)
Calcium: 9.5 mg/dL (ref 8.9–10.3)
Chloride: 107 mmol/L (ref 98–111)
Creatinine: 0.86 mg/dL (ref 0.44–1.00)
GFR, Estimated: >60 mL/min (ref >60)
Glucose, Bld: 96 mg/dL (ref 70–99)
Potassium: 4.2 mmol/L (ref 3.5–5.1)
Sodium: 143 mmol/L (ref 135–145)
Total Bilirubin: 0.4 mg/dL (ref 0.3–1.2)
Total Protein: 5.6 g/dL — ABNORMAL LOW (ref 6.5–8.1)

## 2022-04-06 NOTE — Progress Notes (Signed)
Hematology and Oncology Follow Up Visit  Nicole Campbell [id number] May 12, 1945 76 y.o. 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  She presented with leukocytosis, ATM patient and p53 deletion in 2019.  Prior therapy:   She is status post rituximab weekly for 4 weeks completed on August 21, 2018. She is status post thoracentesis completed on August 12, 2019 with positive cytology. Rituximab 375 mg per metered square weekly started on October 13 of 2020 and completed on 03/04/2019.  This will be repeated every 3 months last treatment completed in March 2021. Bendamustine and rituximab started on Sep 03, 2019.  She is here for cycle 3 of therapy.  Current therapy: Ibrutinib 420 mg daily started in July 2020.  Therapy switched to every other day dosing started and October 2023 due to presumed dermatological toxicity.      Interim History: Nicole Campbell returns today for repeat evaluation.  Since last visit, she reports feeling reasonably well without any complaints.  Her blisters on the upper and lower extremity has completely resolved after switching to every other day dosing of ibrutinib.  She did report occasional pruritus and eczema-like rash and she uses topical cream.  She denies any nausea, vomiting or abdominal pain.  She denies any hospitalizations or illnesses.  She denies any shortness of breath difficulty breathing or lymphadenopathy.  She denies any constitutional symptoms.  She denies any exacerbation of her arthritis at this time.      .    Medications: Updated on review. Current Outpatient Medications  Medication Sig Dispense Refill   acetaminophen (TYLENOL) 650 MG CR tablet Take 1,300 mg by mouth every 8 (eight) hours as needed for pain.     escitalopram (LEXAPRO) 5 MG tablet Take 5 mg by mouth daily.     feeding supplement (BOOST HIGH PROTEIN) LIQD Take 1 Container by mouth daily with  breakfast.     furosemide (LASIX) 20 MG tablet Take 1 tablet (20 mg total) by mouth daily as needed. 60 tablet 11   ibrutinib (IMBRUVICA) 420 MG tablet TAKE 1 TABLET (420 MG) BY MOUTH DAILY. 28 tablet 0   ibrutinib (IMBRUVICA) 420 MG tablet TAKE 1 TABLET (420 MG) BY MOUTH DAILY. 28 tablet 0   levothyroxine (SYNTHROID, LEVOTHROID) 25 MCG tablet Take 25 mcg by mouth daily before breakfast.      Multiple Vitamin (MULTIVITAMIN ADULT PO) Take 1 tablet by mouth daily.     potassium chloride (KLOR-CON M) 10 MEQ tablet Take 1 tablet (10 mEq total) by mouth daily as needed. Pease take along with lasix if needed for legs swelling. 30 tablet 0   spironolactone (ALDACTONE) 25 MG tablet Take 1 tablet (25 mg total) by mouth daily. 30 tablet 2   No current facility-administered medications for this visit.     Allergies:  Allergies  Allergen Reactions   Methotrexate Other (See Comments)    Other reaction(s): horrible lower back pain   Erythromycin Palpitations        Physical Exam:    Blood pressure (!) 137/53, pulse 64, temperature 97.8 F (36.6 C), temperature source Temporal, resp. rate 16, height 5' 6.5" (1.689 m), weight 166 lb (75.3 kg), SpO2 100 %.    ECOG:1     General appearance: Comfortable appearing without any discomfort Head: Normocephalic without any trauma Oropharynx: Mucous membranes are moist and pink without any thrush or ulcers. Eyes: Pupils are equal and round reactive to light.  Lymph nodes: No cervical, supraclavicular, inguinal or axillary lymphadenopathy.   Heart:regular rate and rhythm.  S1 and S2 without leg edema. Lung: Clear without any rhonchi or wheezes.  No dullness to percussion. Abdomin: Soft, nontender, nondistended with good bowel sounds.  No hepatosplenomegaly. Musculoskeletal: No joint deformity or effusion.  Full range of motion noted. Neurological: No deficits noted on motor, sensory and deep tendon reflex exam. Skin: No petechial rash or dryness.   Appeared moist.                         Lab Results: Lab Results  Component Value Date   WBC 5.8 01/27/2022   HGB 13.0 01/27/2022   HCT 39.0 01/27/2022   MCV 92.0 01/27/2022   PLT 188 01/27/2022     Chemistry      Component Value Date/Time   NA 141 01/27/2022 1248   K 4.2 01/27/2022 1248   CL 107 01/27/2022 1248   CO2 32 01/27/2022 1248   BUN 20 01/27/2022 1248   CREATININE 0.81 01/27/2022 1248      Component Value Date/Time   CALCIUM 9.1 01/27/2022 1248   ALKPHOS 65 01/27/2022 1248   AST 15 01/27/2022 1248   ALT 15 01/27/2022 1248   BILITOT 0.7 01/27/2022 5943        76 year old woman with:  1.  CLL presented with lymphocytosis and adenopathy diagnosed in 2019.   The natural course of this disease and treatment choices were reviewed at this time.  Treatment choices including continuing ibrutinib versus switching to Calquence or venetoclax in addition to systemic chemotherapy options were reviewed.  CBC personally reviewed today and continues to show normal findings including normal white cell count and lymphocytes.  After discussion today, she opted to continue with current dose and schedule and will be evaluated closely.  Switching to Calquence or any other agent could be considered if she has relapsed disease or worsening dermatological toxicity.  2.  Rheumatoid arthritis: She remains without any recent exacerbation.  3.  Bullous lesions on her upper and lower extremities: Bullous pemphigoid is a possibility related to treatment with Imbruvica.  Improved at this time with dose reduction.  I recommended dermatology follow-up regarding her eczema rash.  4.  Follow-up: In 2 months for a follow-up.   30  minutes were spent on this encounter.  Time was dedicated to updating disease status, treatment choices and addressing complication related to her condition and therapy.  Zola Button, MD 12/7/202310:07 AM

## 2022-04-18 ENCOUNTER — Other Ambulatory Visit: Payer: Self-pay

## 2022-04-27 IMAGING — CR DG CHEST 2V
2 series · 2 of 2 positions shown · non-contrast
Comparison: Chest radiographs 11/21/2019 and CT 11/13/2019. No
recent prior studies.

CLINICAL DATA: Persistent cough post [id number] in [REDACTED].

EXAM:
CHEST - 2 VIEW

[w chest pa]
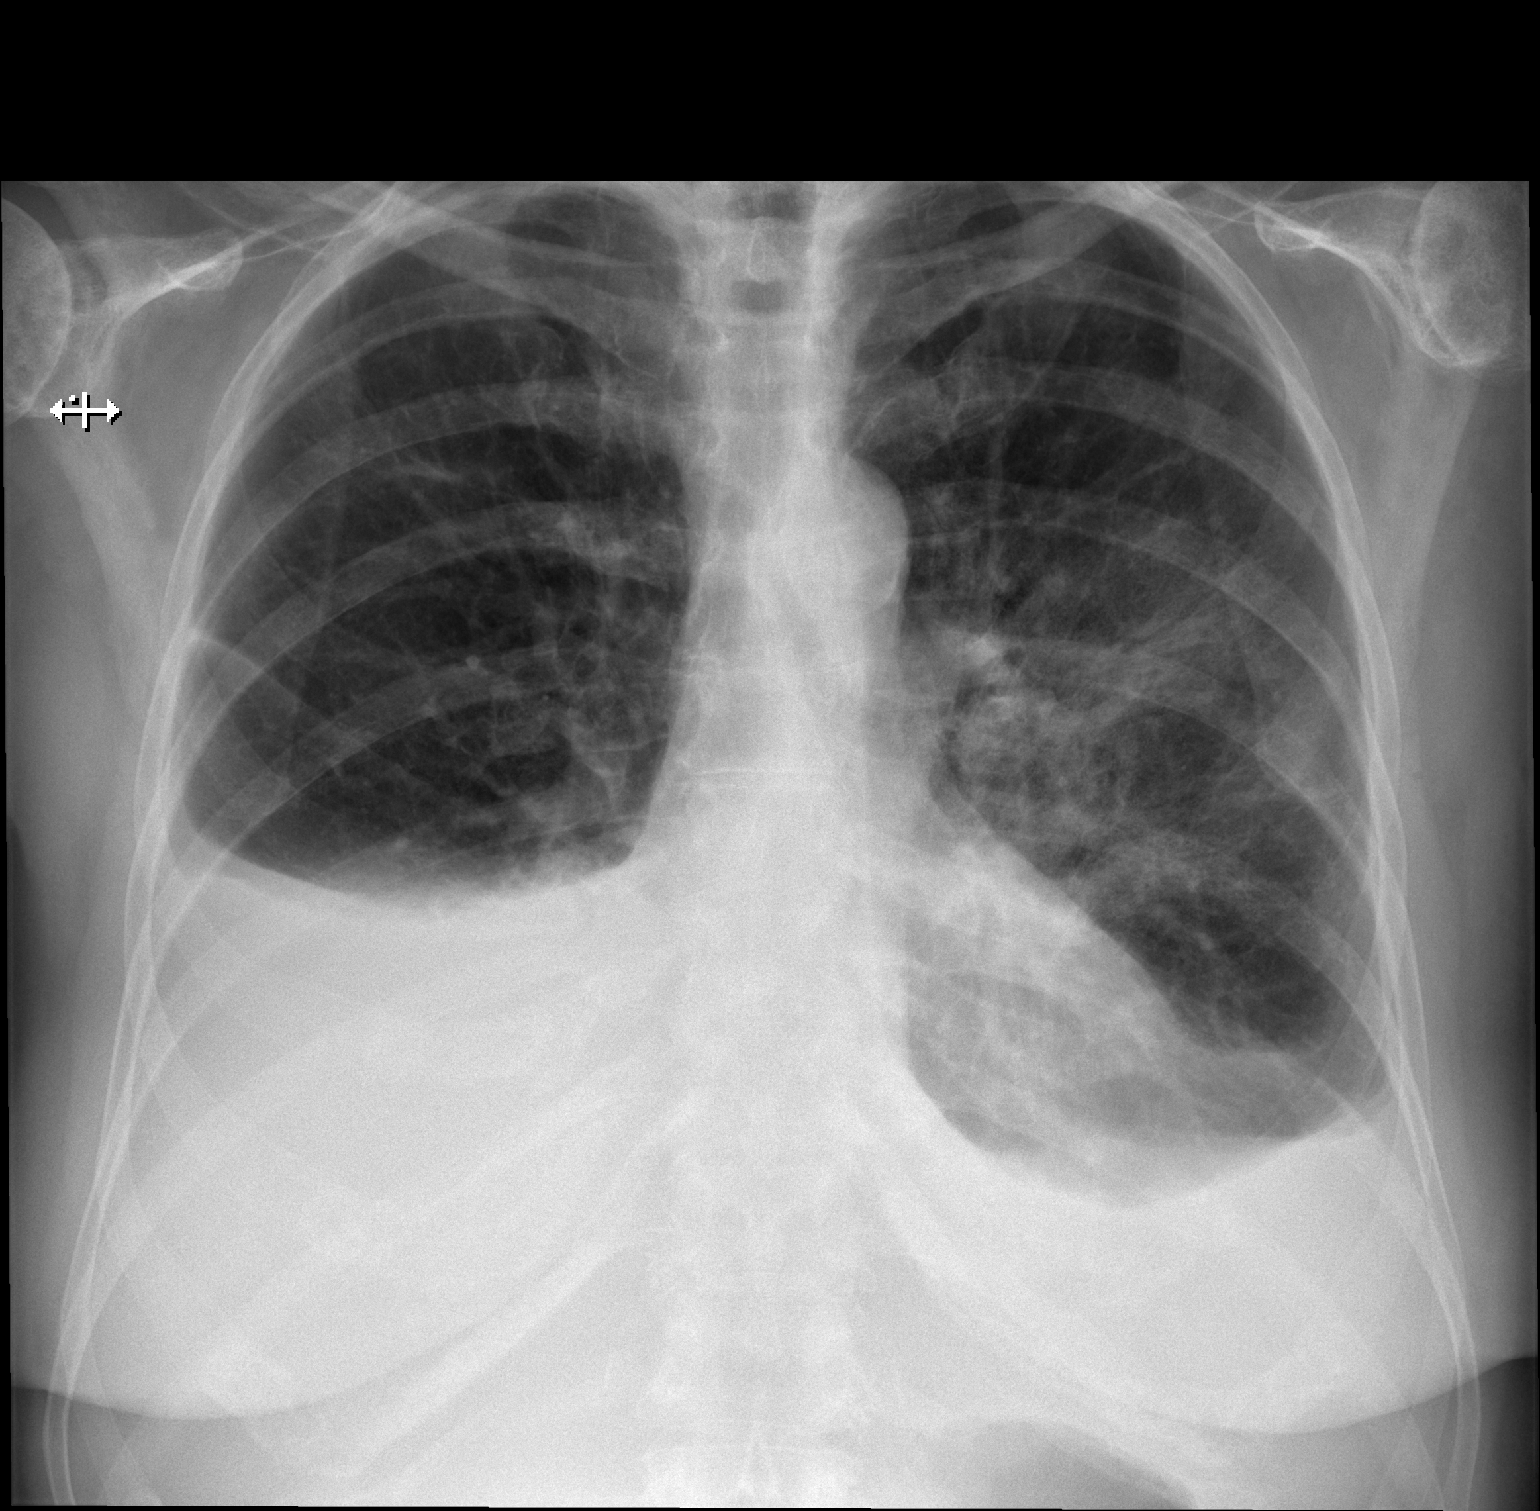

[w chest lat]
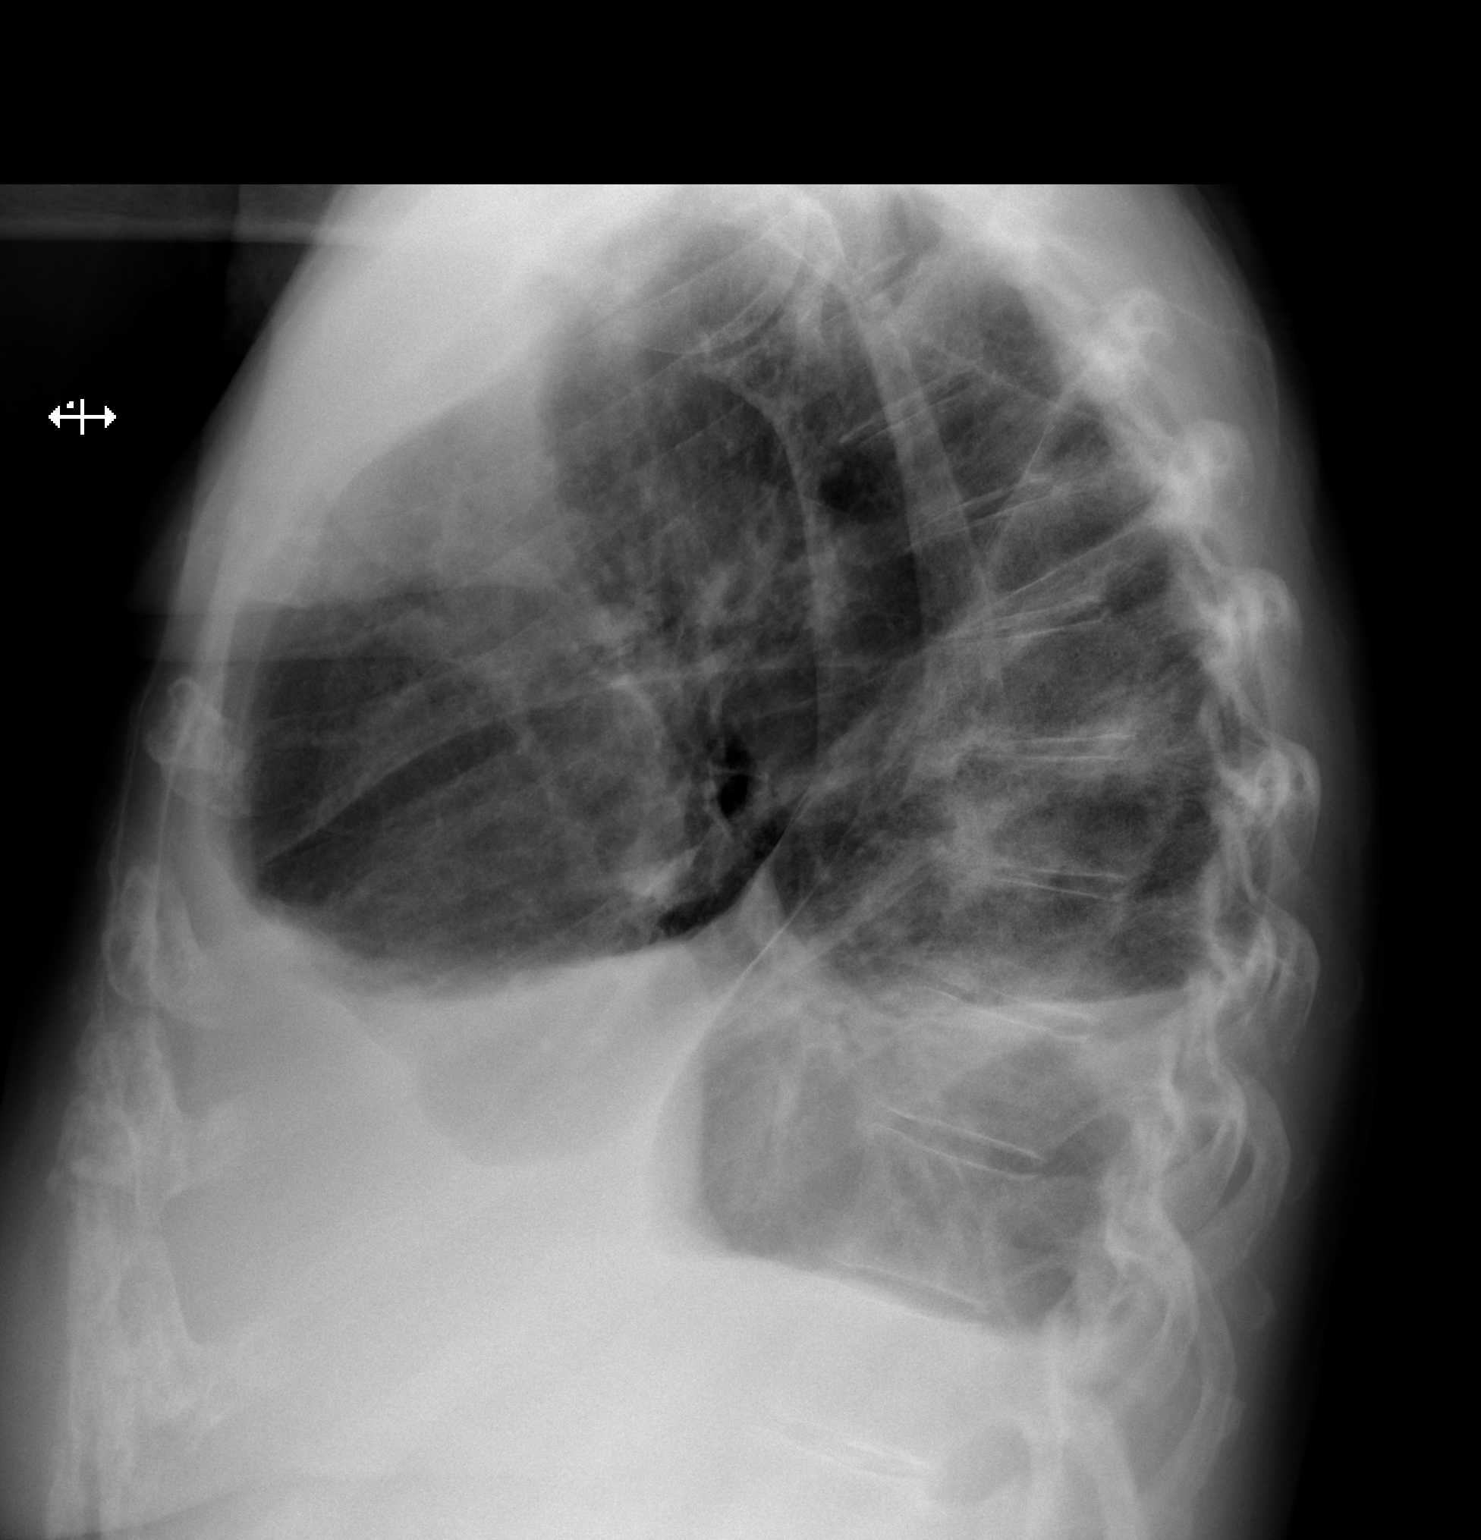

[2 of 2 positions shown; findings below may reference images not displayed]

FINDINGS: The heart size and mediastinal contours are stable. Moderate-sized
right pleural effusion has enlarged compared with the prior
radiographs. A small left pleural effusion has improved. There is
new airspace disease on the left which is primarily in the superior
segment of the lower lobe based on the lateral view mild dependent
atelectasis is present at both lung bases. There is no pneumothorax
or acute osseous abnormality.
IMPRESSION: 1. New superior segment left lower lobe airspace disease consistent
with pneumonia. Followup PA and lateral chest X-ray is recommended
in 3-4 weeks following trial of antibiotic therapy to ensure
resolution and exclude underlying malignancy.
2. Enlarging right pleural effusion. Improved small left pleural
effusion.

## 2022-05-02 DIAGNOSIS — M26622 Arthralgia of left temporomandibular joint: Secondary | ICD-10-CM | POA: Diagnosis not present

## 2022-05-02 DIAGNOSIS — R519 Headache, unspecified: Secondary | ICD-10-CM | POA: Diagnosis not present

## 2022-05-06 IMAGING — DX DG CHEST 2V
2 series · 2 of 2 positions shown · non-contrast
Comparison: 06/22/2020

CLINICAL DATA: Follow-up effusion

EXAM:
CHEST - 2 VIEW

[chest pa]
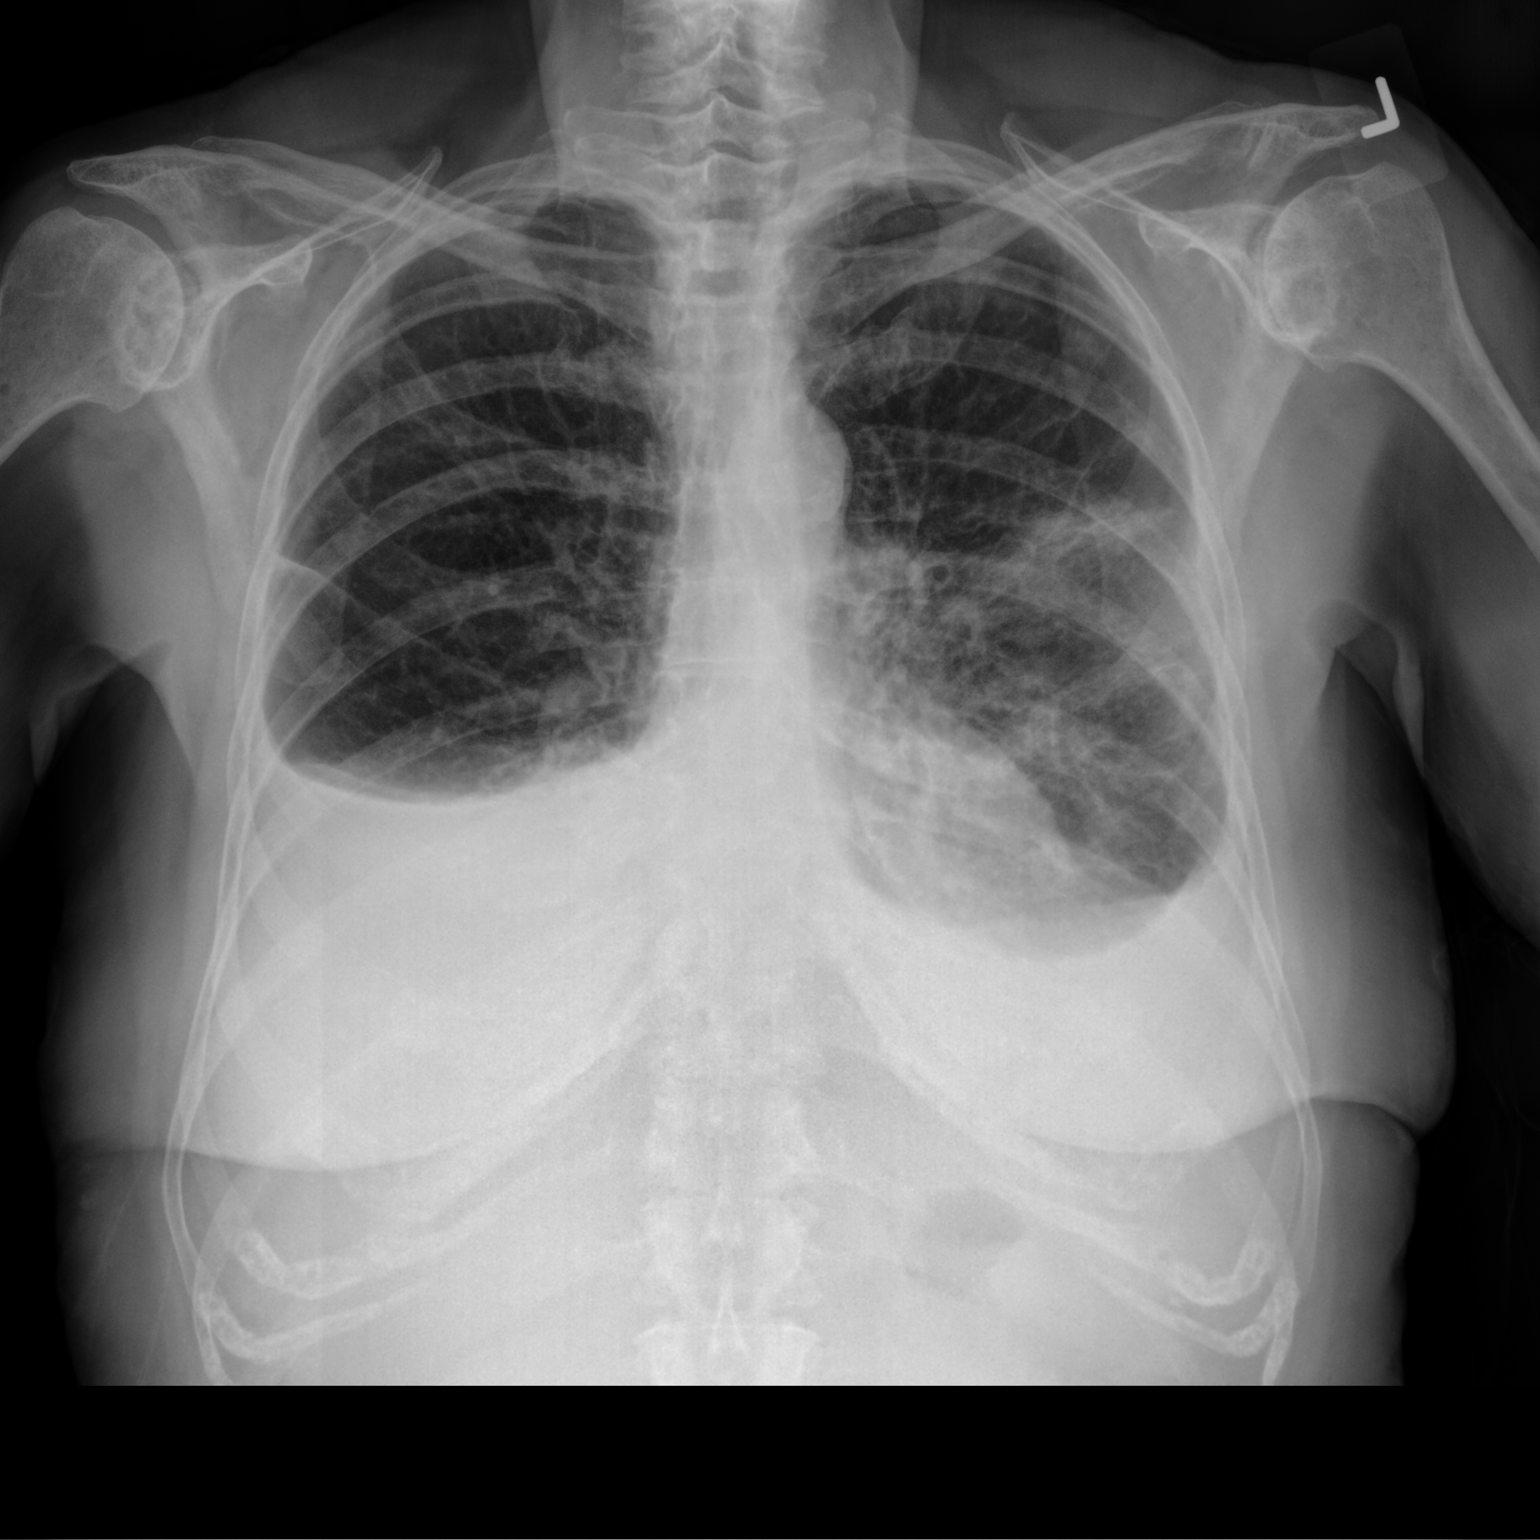

[chest lat]
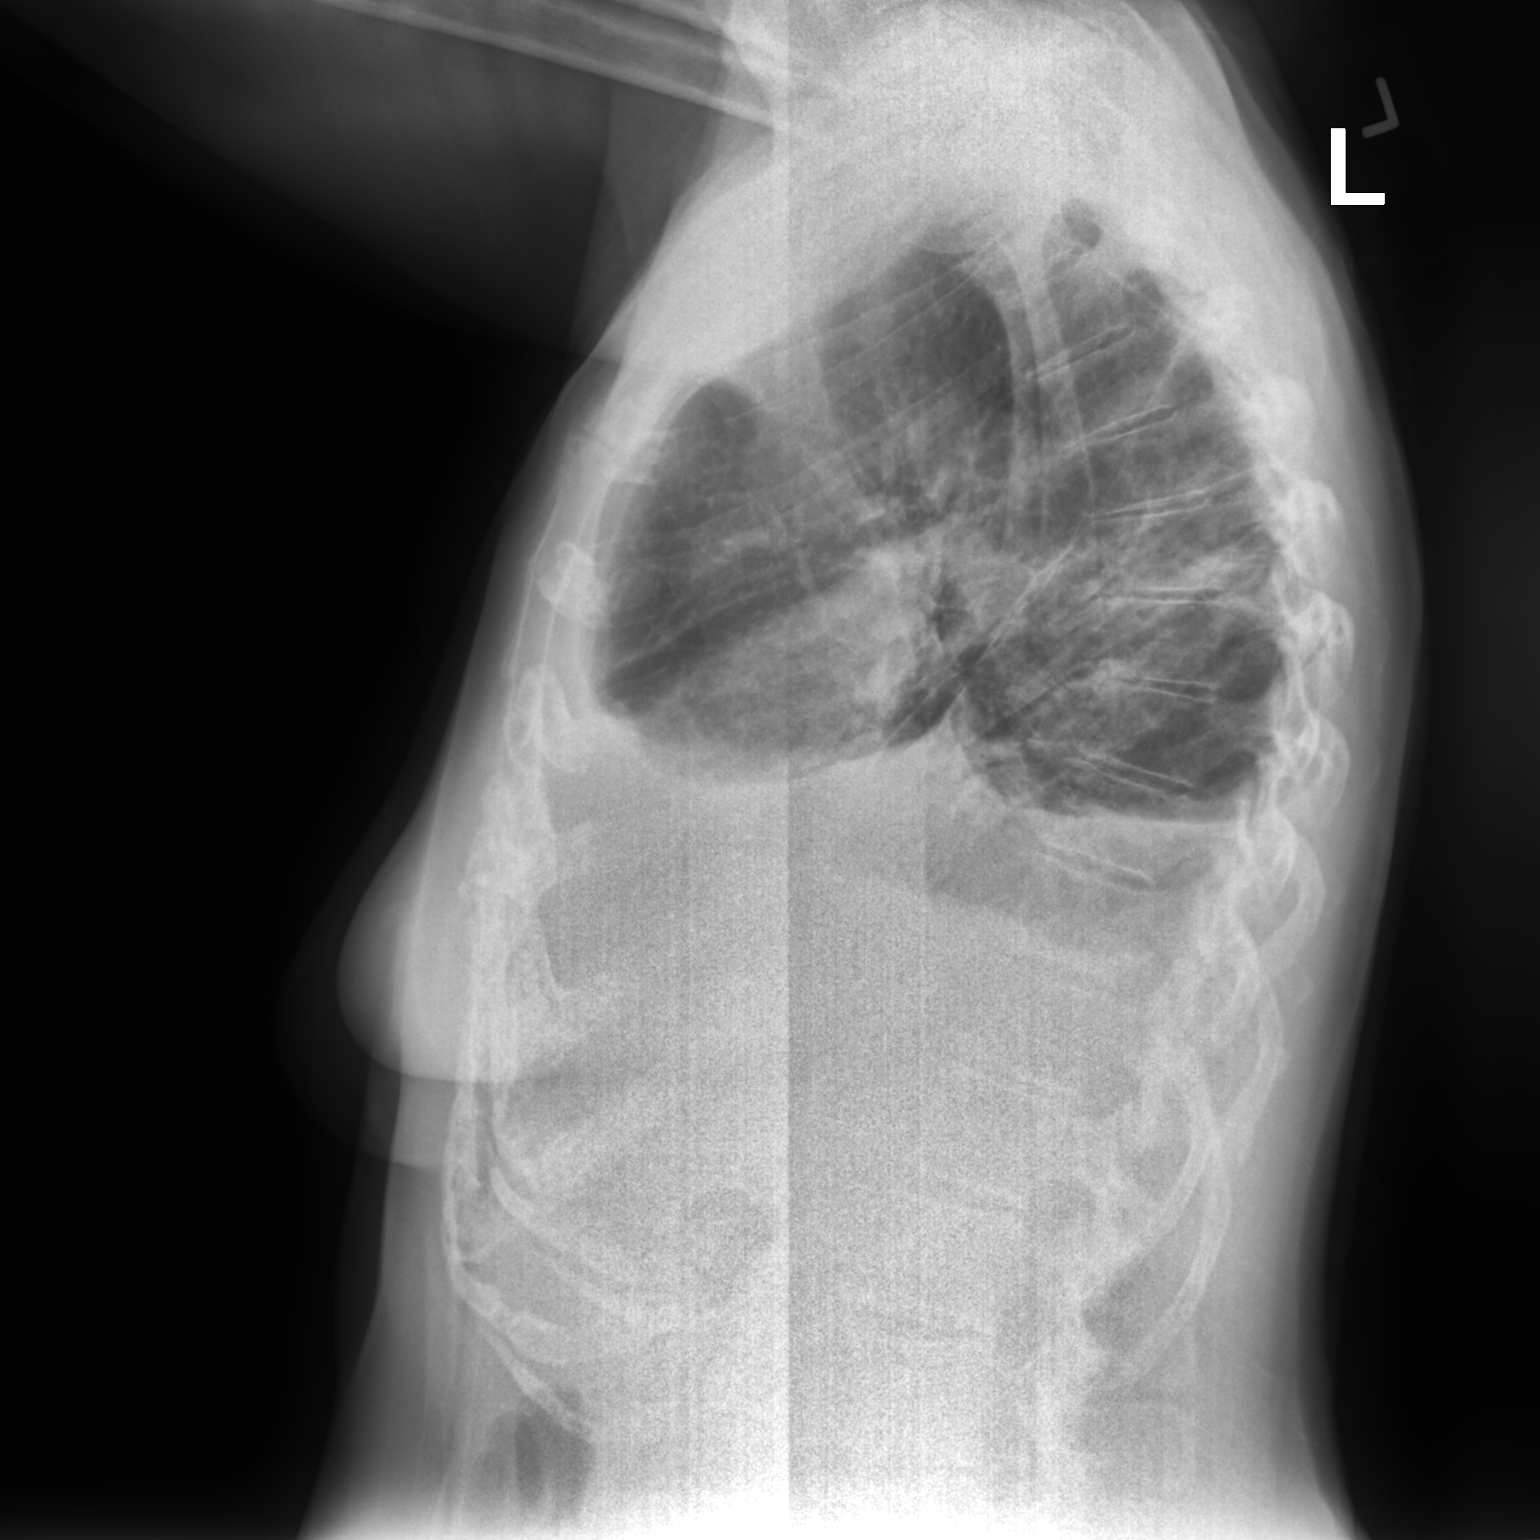

[2 of 2 positions shown; findings below may reference images not displayed]

FINDINGS: Small left pleural effusion. Persistent left lower lobe airspace
disease. Stable moderate right pleural effusion. No pneumothorax.
Stable cardiomediastinal silhouette. No aggressive osseous lesion.
Severe osteoarthritis of bilateral glenohumeral joints.
IMPRESSION: 1. Small left and moderate right pleural effusions without interval
change. Persistent left lower lobe airspace disease concerning for
pneumonia

## 2022-05-10 ENCOUNTER — Other Ambulatory Visit (HOSPITAL_COMMUNITY): Payer: Self-pay

## 2022-05-10 ENCOUNTER — Other Ambulatory Visit: Payer: Self-pay

## 2022-05-10 ENCOUNTER — Other Ambulatory Visit: Payer: Self-pay | Admitting: Oncology

## 2022-05-10 DIAGNOSIS — C9110 Chronic lymphocytic leukemia of B-cell type not having achieved remission: Secondary | ICD-10-CM

## 2022-05-10 MED ORDER — IBRUTINIB 420 MG PO TABS
ORAL_TABLET | ORAL | 0 refills | Status: DC
  Filled 2022-05-10: qty 28, 28d supply, fill #0

## 2022-05-15 ENCOUNTER — Other Ambulatory Visit (HOSPITAL_COMMUNITY): Payer: Self-pay

## 2022-05-17 ENCOUNTER — Other Ambulatory Visit (HOSPITAL_COMMUNITY): Payer: Self-pay

## 2022-06-05 ENCOUNTER — Inpatient Hospital Stay (HOSPITAL_BASED_OUTPATIENT_CLINIC_OR_DEPARTMENT_OTHER): Payer: PPO | Admitting: Hematology

## 2022-06-05 ENCOUNTER — Inpatient Hospital Stay: Payer: PPO | Attending: Oncology

## 2022-06-05 VITALS — BP 142/62 | HR 69 | Temp 97.7°F | Resp 16 | Wt 169.8 lb

## 2022-06-05 DIAGNOSIS — M069 Rheumatoid arthritis, unspecified: Secondary | ICD-10-CM | POA: Insufficient documentation

## 2022-06-05 DIAGNOSIS — C9110 Chronic lymphocytic leukemia of B-cell type not having achieved remission: Secondary | ICD-10-CM

## 2022-06-05 DIAGNOSIS — L139 Bullous disorder, unspecified: Secondary | ICD-10-CM | POA: Insufficient documentation

## 2022-06-05 DIAGNOSIS — Z79899 Other long term (current) drug therapy: Secondary | ICD-10-CM | POA: Insufficient documentation

## 2022-06-05 LAB — CBC WITH DIFFERENTIAL (CANCER CENTER ONLY)
Abs Immature Granulocytes: 0.02 10*3/uL (ref 0.00–0.07)
Basophils Absolute: 0.1 10*3/uL (ref 0.0–0.1)
Basophils Relative: 2 %
Eosinophils Absolute: 0.4 10*3/uL (ref 0.0–0.5)
Eosinophils Relative: 7 %
HCT: 39.6 % (ref 36.0–46.0)
Hemoglobin: 13.1 g/dL (ref 12.0–15.0)
Immature Granulocytes: 0 %
Lymphocytes Relative: 40 %
Lymphs Abs: 1.9 10*3/uL (ref 0.7–4.0)
MCH: 30.2 pg (ref 26.0–34.0)
MCHC: 33.1 g/dL (ref 30.0–36.0)
MCV: 91.2 fL (ref 80.0–100.0)
Monocytes Absolute: 0.8 10*3/uL (ref 0.1–1.0)
Monocytes Relative: 17 %
Neutro Abs: 1.6 10*3/uL — ABNORMAL LOW (ref 1.7–7.7)
Neutrophils Relative %: 34 %
Platelet Count: 209 10*3/uL (ref 150–400)
RBC: 4.34 MIL/uL (ref 3.87–5.11)
RDW: 12.6 % (ref 11.5–15.5)
WBC Count: 4.8 10*3/uL (ref 4.0–10.5)
nRBC: 0 % (ref 0.0–0.2)

## 2022-06-05 LAB — CMP (CANCER CENTER ONLY)
ALT: 16 U/L (ref 0–44)
AST: 16 U/L (ref 15–41)
Albumin: 3.7 g/dL (ref 3.5–5.0)
Alkaline Phosphatase: 65 U/L (ref 38–126)
Anion gap: 3 — ABNORMAL LOW (ref 5–15)
BUN: 25 mg/dL — ABNORMAL HIGH (ref 8–23)
CO2: 31 mmol/L (ref 22–32)
Calcium: 9.4 mg/dL (ref 8.9–10.3)
Chloride: 106 mmol/L (ref 98–111)
Creatinine: 0.86 mg/dL (ref 0.44–1.00)
GFR, Estimated: >60 mL/min (ref >60)
Glucose, Bld: 91 mg/dL (ref 70–99)
Potassium: 4.2 mmol/L (ref 3.5–5.1)
Sodium: 140 mmol/L (ref 135–145)
Total Bilirubin: 0.6 mg/dL (ref 0.3–1.2)
Total Protein: 5.6 g/dL — ABNORMAL LOW (ref 6.5–8.1)

## 2022-06-05 NOTE — Progress Notes (Signed)
HEMATOLOGY/ONCOLOGY CLINIC NOTE  Date of Service: 06/05/2022  Patient Care Team: [name], MD as PCP - General (Family Medicine) Izora Gala, MD as Consulting Physician (Otolaryngology)  CHIEF COMPLAINTS/PURPOSE OF CONSULTATION:  CLL (chronic lymphocytic leukemia) [facility])  Prior therapy:    She is status post rituximab weekly for 4 weeks completed on August 21, 2018. She is status post thoracentesis completed on August 12, 2019 with positive cytology. Rituximab 375 mg per metered square weekly started on October 13 of 2020 and completed on 03/04/2019.  This will be repeated every 3 months last treatment completed in March 2021. Bendamustine and rituximab started on Sep 03, 2019.  She is here for cycle 3 of therapy.  HISTORY OF PRESENTING ILLNESS:   Nicole Campbell is a wonderful 77 y.o. female who is here for continued evaluation and management of CLL. Patient was following up with Dr. Alen Blew and is transferring her care to me. She was first diagnosed with CLL in 2019 where she presented with leukocytosis with p53 deletion.   Patient's current treatment is Ibrutinib 420 mg every other day. She was first started on Ibrutinib daily in July 2020, but was switched to Ibrutinib every other day in September 2023 due to presumed dermatological toxicity.  Patient notes that she was getting insect bites which was causing her bruising and scaring in September 2023, which is the reason Dr. Alen Blew changed her treatment to Ibrutinib to every other day.   Patient was last seen by me on 04/06/2022 and she was doing well overall. She did report occasional pruritus and eczema-like rash.   Patient is here with her husband during this visit. She reports she has been doing well without any new medical concerns since our last visit. She notes that her rheumatoid arthritis is stable without any new concerns.   She reports that her bruising has been getting better since Dr. Alen Blew changed her Ibrutinib  to every other day. She notes that she is still has scars. Patient notes that skin rashes and blisters were below knee and in her arms.   She notes she was recently diagnosed with eczema.   Patient is currently taking single Ibrutinib 420 mg every other day.   Patient denies any new infection issues, fever, chills, night sweats, unexpected fever, unexpected weight loss, abdominal pain, abnormal bowel movement, urination problem, chest pain, or leg swelling. She reports that she did have neutropenic fever around February 2023, but she is unsure if her neutropenia was due to Ibrutinib. Patient reports that physicians reported that her neutropenic fever was probably due to sinus infection. She notes her fever resolved without changing or reducing the dose of Ibrutinib.   MEDICAL HISTORY:  Past Medical History:  Diagnosis Date   CLL (chronic lymphocytic leukemia) (HCC)    Hypertension    Hypothyroid    MVP (mitral valve prolapse)    Rheumatoid arthritis ([place])     SURGICAL HISTORY: Past Surgical History:  Procedure Laterality Date   ABDOMINAL HYSTERECTOMY     IR THORACENTESIS ASP PLEURAL SPACE W/IMG GUIDE  11/21/2019   IR THORACENTESIS ASP PLEURAL SPACE W/IMG GUIDE  07/05/2020   TONSILLECTOMY      SOCIAL HISTORY: Social History   Socioeconomic History   Marital status: Married    Spouse name: Not on file   Number of children: 3   Years of education: Not on file   Highest education level: Not on file  Occupational History   Not on file  Tobacco  Use   Smoking status: Never   Smokeless tobacco: Never  Vaping Use   Vaping Use: Never used  Substance and Sexual Activity   Alcohol use: Yes    Comment: Rare   Drug use: Never   Sexual activity: Not on file  Other Topics Concern   Not on file  Social History Narrative   Not on file   Social Determinants of Health   Financial Resource Strain: Not on file  Food Insecurity: Not on file  Transportation Needs: Not on file  Physical  Activity: Not on file  Stress: Not on file  Social Connections: Not on file  Intimate Partner Violence: Not on file    FAMILY HISTORY: Family History  Problem Relation Age of Onset   Congestive Heart Failure Father     ALLERGIES:  is allergic to methotrexate and erythromycin.  MEDICATIONS:  Current Outpatient Medications  Medication Sig Dispense Refill   acetaminophen (TYLENOL) 650 MG CR tablet Take 1,300 mg by mouth every 8 (eight) hours as needed for pain.     escitalopram (LEXAPRO) 5 MG tablet Take 5 mg by mouth daily.     feeding supplement (BOOST HIGH PROTEIN) LIQD Take 1 Container by mouth daily with breakfast.     furosemide (LASIX) 20 MG tablet Take 1 tablet (20 mg total) by mouth daily as needed. 60 tablet 11   ibrutinib (IMBRUVICA) 420 MG tablet TAKE 1 TABLET (420 MG) BY MOUTH DAILY. 28 tablet 0   ibrutinib (IMBRUVICA) 420 MG tablet TAKE 1 TABLET (420 MG) BY MOUTH DAILY. 28 tablet 0   levothyroxine (SYNTHROID, LEVOTHROID) 25 MCG tablet Take 25 mcg by mouth daily before breakfast.      Multiple Vitamin (MULTIVITAMIN ADULT PO) Take 1 tablet by mouth daily.     potassium chloride (KLOR-CON M) 10 MEQ tablet Take 1 tablet (10 mEq total) by mouth daily as needed. Pease take along with lasix if needed for legs swelling. 30 tablet 0   spironolactone (ALDACTONE) 25 MG tablet Take 1 tablet (25 mg total) by mouth daily. 30 tablet 2   No current facility-administered medications for this visit.    REVIEW OF SYSTEMS:    10 Point review of Systems was done is negative except as noted above.  PHYSICAL EXAMINATION: ECOG PERFORMANCE STATUS: 1 - Symptomatic but completely ambulatory  . Vitals:   06/05/22 0935  BP: (!) 142/62  Pulse: 69  Resp: 16  Temp: 97.7 F (36.5 C)  SpO2: 100%   Filed Weights   06/05/22 0935  Weight: 169 lb 12.8 oz (77 kg)   .Body mass index is 27 kg/m.  GENERAL:alert, in no acute distress and comfortable SKIN: no acute rashes, no significant  lesions EYES: conjunctiva are pink and non-injected, sclera anicteric OROPHARYNX: MMM, no exudates, no oropharyngeal erythema or ulceration NECK: supple, no JVD LYMPH:  no palpable lymphadenopathy in the cervical, axillary or inguinal regions LUNGS: clear to auscultation b/l with normal respiratory effort HEART: regular rate & rhythm ABDOMEN:  normoactive bowel sounds , non tender, not distended. Extremity: no pedal edema PSYCH: alert & oriented x 3 with fluent speech NEURO: no focal motor/sensory deficits  LABORATORY DATA:  I have reviewed the data as listed  .    Latest Ref Rng & Units 06/05/2022    9:14 AM 04/06/2022   10:06 AM 01/27/2022   12:48 PM  CBC  WBC 4.0 - 10.5 K/uL 4.8  5.9  5.8   Hemoglobin 12.0 - 15.0 g/dL 13.1  12.6  13.0   Hematocrit 36.0 - 46.0 % 39.6  38.3  39.0   Platelets 150 - 400 K/uL 209  222  188    .    Latest Ref Rng & Units 06/05/2022    9:14 AM 04/06/2022   10:06 AM 01/27/2022   12:48 PM  CMP  Glucose 70 - 99 mg/dL 91  96  78   BUN 8 - 23 mg/dL 25  18  20   $ Creatinine 0.44 - 1.00 mg/dL 0.86  0.86  0.81   Sodium 135 - 145 mmol/L 140  143  141   Potassium 3.5 - 5.1 mmol/L 4.2  4.2  4.2   Chloride 98 - 111 mmol/L 106  107  107   CO2 22 - 32 mmol/L 31  32  32   Calcium 8.9 - 10.3 mg/dL 9.4  9.5  9.1   Total Protein 6.5 - 8.1 g/dL 5.6  5.6  6.0   Total Bilirubin 0.3 - 1.2 mg/dL 0.6  0.4  0.7   Alkaline Phos 38 - 126 U/L 65  73  65   AST 15 - 41 U/L 16  17  15   $ ALT 0 - 44 U/L 16  16  15     $ RADIOGRAPHIC STUDIES: I have personally reviewed the radiological images as listed and agreed with the findings in the report. No results found.  ASSESSMENT & PLAN:   77 year old woman with:   1.  CLL presented with lymphocytosis and adenopathy diagnosed in 2019. 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  Rheumatoid arthritis: She remains without any recent exacerbation.  3.  Bullous lesions on her upper and lower extremities: Possibly related to skin  reaction associated with Irbutinib or the patient autoimmune disorder (RA)  PLAN: -confirm all per previous treatment history and other details with the patient. -Discussed lab results from today, 06/05/2022, with the patient and her husband. CBC and CMP are stable.  -Discussed the option of reducing the dose of Ibrutinib daily or discussed the option of starting acalabrutinib twice daily.  -Educated the patient on risks and benefits of acalabrutinib.  -Discussed with the patient that her skin rashes/blisters/bruising is not only due to Ibrutinib. -Patient wants to continue Ibrutinib 420 mg every other day for now.   FOLLOW-UP: RTC with Dr Irene Limbo with labs in 2 months.  .The total time spent in the appointment was 41 minutes* .  All of the patient's questions were answered with apparent satisfaction. The patient knows to call the clinic with any problems, questions or concerns.   Sullivan Lone MD MS AAHIVMS [facility] Hematology/Oncology [facility]  .*Total Encounter Time as defined by the Centers for Medicare and Medicaid Services includes, in addition to the face-to-face time of a patient visit (documented in the note above) non-face-to-face time: obtaining and reviewing outside history, ordering and reviewing medications, tests or procedures, care coordination (communications with other health care professionals or caregivers) and documentation in the medical record.   06/05/2022 9:17 AM   I, Cleda Mccreedy, am acting as a Education administrator for Sullivan Lone, MD. .I have reviewed the above documentation for accuracy and completeness, and I agree with the above.  Brunetta Genera MD

## 2022-06-06 ENCOUNTER — Telehealth: Payer: Self-pay | Admitting: Hematology

## 2022-06-06 NOTE — Telephone Encounter (Signed)
Called patient to confirm appointments. Patient notified.

## 2022-06-20 ENCOUNTER — Other Ambulatory Visit: Payer: Self-pay | Admitting: Hematology

## 2022-06-20 ENCOUNTER — Other Ambulatory Visit (HOSPITAL_COMMUNITY): Payer: Self-pay

## 2022-06-20 DIAGNOSIS — C9110 Chronic lymphocytic leukemia of B-cell type not having achieved remission: Secondary | ICD-10-CM

## 2022-06-21 ENCOUNTER — Other Ambulatory Visit (HOSPITAL_COMMUNITY): Payer: Self-pay

## 2022-06-21 ENCOUNTER — Other Ambulatory Visit: Payer: Self-pay

## 2022-06-21 MED ORDER — IBRUTINIB 420 MG PO TABS
ORAL_TABLET | ORAL | 0 refills | Status: DC
  Filled 2022-06-21: qty 28, 28d supply, fill #0

## 2022-07-13 DIAGNOSIS — K130 Diseases of lips: Secondary | ICD-10-CM | POA: Diagnosis not present

## 2022-07-13 DIAGNOSIS — H1030 Unspecified acute conjunctivitis, unspecified eye: Secondary | ICD-10-CM | POA: Diagnosis not present

## 2022-08-09 ENCOUNTER — Other Ambulatory Visit: Payer: Self-pay

## 2022-08-09 DIAGNOSIS — C9110 Chronic lymphocytic leukemia of B-cell type not having achieved remission: Secondary | ICD-10-CM

## 2022-08-11 ENCOUNTER — Inpatient Hospital Stay (HOSPITAL_BASED_OUTPATIENT_CLINIC_OR_DEPARTMENT_OTHER): Payer: PPO | Admitting: Hematology

## 2022-08-11 ENCOUNTER — Inpatient Hospital Stay: Payer: PPO | Attending: Oncology

## 2022-08-11 VITALS — BP 143/68 | HR 82 | Temp 97.0°F | Resp 20 | Wt 170.8 lb

## 2022-08-11 DIAGNOSIS — L139 Bullous disorder, unspecified: Secondary | ICD-10-CM | POA: Insufficient documentation

## 2022-08-11 DIAGNOSIS — Z79899 Other long term (current) drug therapy: Secondary | ICD-10-CM | POA: Insufficient documentation

## 2022-08-11 DIAGNOSIS — C9110 Chronic lymphocytic leukemia of B-cell type not having achieved remission: Secondary | ICD-10-CM

## 2022-08-11 DIAGNOSIS — M069 Rheumatoid arthritis, unspecified: Secondary | ICD-10-CM | POA: Insufficient documentation

## 2022-08-11 LAB — CBC WITH DIFFERENTIAL (CANCER CENTER ONLY)
Abs Immature Granulocytes: 0.02 10*3/uL (ref 0.00–0.07)
Basophils Absolute: 0 10*3/uL (ref 0.0–0.1)
Basophils Relative: 0 %
Eosinophils Absolute: 0.2 10*3/uL (ref 0.0–0.5)
Eosinophils Relative: 3 %
HCT: 39.5 % (ref 36.0–46.0)
Hemoglobin: 13.5 g/dL (ref 12.0–15.0)
Immature Granulocytes: 0 %
Lymphocytes Relative: 23 %
Lymphs Abs: 1.6 10*3/uL (ref 0.7–4.0)
MCH: 30.7 pg (ref 26.0–34.0)
MCHC: 34.2 g/dL (ref 30.0–36.0)
MCV: 89.8 fL (ref 80.0–100.0)
Monocytes Absolute: 0.8 10*3/uL (ref 0.1–1.0)
Monocytes Relative: 12 %
Neutro Abs: 4.2 10*3/uL (ref 1.7–7.7)
Neutrophils Relative %: 62 %
Platelet Count: 193 10*3/uL (ref 150–400)
RBC: 4.4 MIL/uL (ref 3.87–5.11)
RDW: 12.2 % (ref 11.5–15.5)
WBC Count: 6.9 10*3/uL (ref 4.0–10.5)
nRBC: 0 % (ref 0.0–0.2)

## 2022-08-11 LAB — CMP (CANCER CENTER ONLY)
ALT: 16 U/L (ref 0–44)
AST: 15 U/L (ref 15–41)
Albumin: 4 g/dL (ref 3.5–5.0)
Alkaline Phosphatase: 59 U/L (ref 38–126)
Anion gap: 3 — ABNORMAL LOW (ref 5–15)
BUN: 29 mg/dL — ABNORMAL HIGH (ref 8–23)
CO2: 32 mmol/L (ref 22–32)
Calcium: 9.6 mg/dL (ref 8.9–10.3)
Chloride: 106 mmol/L (ref 98–111)
Creatinine: 0.96 mg/dL (ref 0.44–1.00)
GFR, Estimated: >60 mL/min (ref >60)
Glucose, Bld: 94 mg/dL (ref 70–99)
Potassium: 4.6 mmol/L (ref 3.5–5.1)
Sodium: 141 mmol/L (ref 135–145)
Total Bilirubin: 0.6 mg/dL (ref 0.3–1.2)
Total Protein: 6 g/dL — ABNORMAL LOW (ref 6.5–8.1)

## 2022-08-11 NOTE — Progress Notes (Signed)
HEMATOLOGY/ONCOLOGY CLINIC NOTE  Date of Service: 08/11/2022  Patient Care Team: Merri Brunette, MD as PCP - General (Family Medicine) Serena Colonel, MD as Consulting Physician (Otolaryngology)  CHIEF COMPLAINTS/PURPOSE OF CONSULTATION:  CLL (chronic lymphocytic leukemia) [facility])  Prior therapy:    She is status post rituximab weekly for 4 weeks completed on August 21, 2018. She is status post thoracentesis completed on August 12, 2019 with positive cytology. Rituximab 375 mg per metered square weekly started on October 13 of 2020 and completed on 03/04/2019.  This will be repeated every 3 months last treatment completed in March 2021. Bendamustine and rituximab started on Sep 03, 2019.  She is here for cycle 3 of therapy.  HISTORY OF PRESENTING ILLNESS:   Nicole Campbell is a wonderful 77 y.o. female who is here for continued evaluation and management of CLL. Patient was following up with Dr. Clelia Croft and has transferried her care to me. She was first diagnosed with CLL in 2019 where she presented with leukocytosis with p53 deletion.   Patient's current treatment is Ibrutinib 420 mg every other day. She was first started on Ibrutinib daily in July 2020, but was switched to Ibrutinib every other day in September 2023 due to presumed dermatological toxicity.  Patient notes that she was getting insect bites which was causing her bruising and scaring in September 2023, which is the reason Dr. Clelia Croft changed her treatment to Ibrutinib to every other day.   INTERVAL HISTORY:  Nicole Campbell is a 76 y.o. female here for continued evaluation and management of chronic lymphocytic leukemia. Patient was last seen by me on 06/05/2022 and reported that she continued to have scars, but was doing well overall.  Today, she is accompanied by her husband. She reports that she has been feeling well overall. Patient is tolerating her Ibrutinib 420 mg every other day treatment regimen well with no  toxicities.  Patient continues to experience bumps/bruises, but this has not been too significant. She denies any new leg swelling, unexplained fevers, chills, night sweats, new lumps/bumps, change in breathing, chest pain, SOB, abdominal pain/distention, or change in urination.  She does report having digestion issues over the last 3-4 weeks, and has endorsed diarrhea, but this has improved. Patient reports that she no longer takes nutritional supplements, potassium, or spirolactone.  Patient reports that she is not currently taking any medication for her rheumatoid arthritis at this time. She notes her fingers are stiff at times. She denies any recent changes in medications.  MEDICAL HISTORY:  Past Medical History:  Diagnosis Date   CLL (chronic lymphocytic leukemia) (HCC)    Hypertension    Hypothyroid    MVP (mitral valve prolapse)    Rheumatoid arthritis (HCC)     SURGICAL HISTORY: Past Surgical History:  Procedure Laterality Date   ABDOMINAL HYSTERECTOMY     IR THORACENTESIS ASP PLEURAL SPACE W/IMG GUIDE  11/21/2019   IR THORACENTESIS ASP PLEURAL SPACE W/IMG GUIDE  07/05/2020   TONSILLECTOMY      SOCIAL HISTORY: Social History   Socioeconomic History   Marital status: Married    Spouse name: Not on file   Number of children: 3   Years of education: Not on file   Highest education level: Not on file  Occupational History   Not on file  Tobacco Use   Smoking status: Never   Smokeless tobacco: Never  Vaping Use   Vaping Use: Never used  Substance and Sexual Activity   Alcohol use:  Yes    Comment: Rare   Drug use: Never   Sexual activity: Not on file  Other Topics Concern   Not on file  Social History Narrative   Not on file   Social Determinants of Health   Financial Resource Strain: Not on file  Food Insecurity: Not on file  Transportation Needs: Not on file  Physical Activity: Not on file  Stress: Not on file  Social Connections: Not on file  Intimate  Partner Violence: Not on file    FAMILY HISTORY: Family History  Problem Relation Age of Onset   Congestive Heart Failure Father     ALLERGIES:  is allergic to methotrexate and erythromycin.  MEDICATIONS:  Current Outpatient Medications  Medication Sig Dispense Refill   acetaminophen (TYLENOL) 650 MG CR tablet Take 1,300 mg by mouth every 8 (eight) hours as needed for pain.     escitalopram (LEXAPRO) 5 MG tablet Take 5 mg by mouth daily.     feeding supplement (BOOST HIGH PROTEIN) LIQD Take 1 Container by mouth daily with breakfast. (Patient not taking: Reported on 06/05/2022)     furosemide (LASIX) 20 MG tablet Take 1 tablet (20 mg total) by mouth daily as needed. 60 tablet 11   ibrutinib (IMBRUVICA) 420 MG tablet TAKE 1 TABLET (420 MG) BY MOUTH DAILY. 28 tablet 0   levothyroxine (SYNTHROID, LEVOTHROID) 25 MCG tablet Take 25 mcg by mouth daily before breakfast.      Multiple Vitamin (MULTIVITAMIN ADULT PO) Take 1 tablet by mouth daily. (Patient not taking: Reported on 06/05/2022)     potassium chloride (KLOR-CON M) 10 MEQ tablet Take 1 tablet (10 mEq total) by mouth daily as needed. Pease take along with lasix if needed for legs swelling. 30 tablet 0   spironolactone (ALDACTONE) 25 MG tablet Take 1 tablet (25 mg total) by mouth daily. 30 tablet 2   No current facility-administered medications for this visit.    REVIEW OF SYSTEMS:    10 Point review of Systems was done is negative except as noted above.   PHYSICAL EXAMINATION: ECOG PERFORMANCE STATUS: 1 - Symptomatic but completely ambulatory  . Vitals:   08/11/22 1015  BP: (!) 143/68  Pulse: 82  Resp: 20  Temp: (!) 97 F (36.1 C)  SpO2: 100%    Filed Weights   08/11/22 1015  Weight: 170 lb 12.8 oz (77.5 kg)    .Body mass index is 27.15 kg/m.  GENERAL:alert, in no acute distress and comfortable SKIN: no acute rashes, no significant lesions EYES: conjunctiva are pink and non-injected, sclera anicteric OROPHARYNX:  MMM, no exudates, no oropharyngeal erythema or ulceration NECK: supple, no JVD LYMPH:  no palpable lymphadenopathy in the cervical, axillary or inguinal regions LUNGS: clear to auscultation b/l with normal respiratory effort HEART: regular rate & rhythm ABDOMEN:  normoactive bowel sounds , non tender, not distended. No palpable hepatosplenomegaly. Extremity: no pedal edema PSYCH: alert & oriented x 3 with fluent speech NEURO: no focal motor/sensory deficits   LABORATORY DATA:  I have reviewed the data as listed  .    Latest Ref Rng & Units 08/11/2022    9:24 AM 06/05/2022    9:14 AM 04/06/2022   10:06 AM  CBC  WBC 4.0 - 10.5 K/uL 6.9  4.8  5.9   Hemoglobin 12.0 - 15.0 g/dL 16.1  09.6  04.5   Hematocrit 36.0 - 46.0 % 39.5  39.6  38.3   Platelets 150 - 400 K/uL 193  209  222    .    Latest Ref Rng & Units 08/11/2022    9:24 AM 06/05/2022    9:14 AM 04/06/2022   10:06 AM  CMP  Glucose 70 - 99 mg/dL 94  91  96   BUN 8 - 23 mg/dL Creatinine 0.44 - 1.00 mg/dL 1.61  0.96  0.45   Sodium 135 - 145 mmol/L 141  140  143   Potassium 3.5 - 5.1 mmol/L 4.6  4.2  4.2   Chloride 98 - 111 mmol/L 106  106  107   CO2 22 - 32 mmol/L 32  31  32   Calcium 8.9 - 10.3 mg/dL 9.6  9.4  9.5   Total Protein 6.5 - 8.1 g/dL 6.0  5.6  5.6   Total Bilirubin 0.3 - 1.2 mg/dL 0.6  0.6  0.4   Alkaline Phos 38 - 126 U/L 59  65  73   AST 15 - 41 U/L ALT 0 - 44 U/L RADIOGRAPHIC STUDIES: I have personally reviewed the radiological images as listed and agreed with the findings in the report. No results found.  ASSESSMENT & PLAN:   77 year old woman with:   1.  CLL presented with lymphocytosis and adenopathy diagnosed in 2019. 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  Rheumatoid arthritis: She remains without any recent exacerbation.  3.  Bullous lesions on her upper and lower extremities: Possibly related to skin reaction associated with Irbutinib or the patient  autoimmune disorder (RA)  PLAN:  -Discussed lab results on 08/11/2022 with patient. CBC showed WBC of 6.9, hemoglobin improved to 13.5, and platelets of 193K. -no anemia -neutrophils improved to 4.2 -Lymphocytes improved to 1.6K -CMP reveals minimal dehydration, otherwise normal -Patient has no major new concerning symptoms at this time -Patient is tolerating her Ibrutinib 420 mg every other day treatment regimen well with no toxicities.  -Patient wants to continue Ibrutinib 420 mg every other day for now.  -Patient may consider changing medication regiment depending on if her blisters are stable. Patient's symptoms are stable and tolerable at this time. -advised patient to use sun screen for optimal skin protection  FOLLOW-UP: RTC with Dr Candise Che with labs in 2 months.  The total time spent in the appointment was 21 minutes* .  All of the patient's questions were answered with apparent satisfaction. The patient knows to call the clinic with any problems, questions or concerns.   Wyvonnia Lora MD MS AAHIVMS [facility] Hematology/Oncology Physician [facility]  .*Total Encounter Time as defined by the Centers for Medicare and Medicaid Services includes, in addition to the face-to-face time of a patient visit (documented in the note above) non-face-to-face time: obtaining and reviewing outside history, ordering and reviewing medications, tests or procedures, care coordination (communications with other health care professionals or caregivers) and documentation in the medical record.   I,Mitra Faeizi,acting as a Neurosurgeon for Wyvonnia Lora, MD.,have documented all relevant documentation on the behalf of Wyvonnia Lora, MD,as directed by  Wyvonnia Lora, MD while in the presence of Wyvonnia Lora, MD.  .I have reviewed the above documentation for accuracy and completeness, and I agree with the above. Johney Maine MD

## 2022-08-14 ENCOUNTER — Telehealth: Payer: Self-pay | Admitting: Hematology

## 2022-08-14 NOTE — Telephone Encounter (Signed)
Called patient per 4/12 los notes to schedule f/u. Left voicemail with new appointment information and contact details if needing to reschedule.  

## 2022-09-06 ENCOUNTER — Other Ambulatory Visit (HOSPITAL_COMMUNITY): Payer: Self-pay

## 2022-09-06 ENCOUNTER — Other Ambulatory Visit: Payer: Self-pay

## 2022-09-06 ENCOUNTER — Other Ambulatory Visit: Payer: Self-pay | Admitting: Hematology

## 2022-09-06 DIAGNOSIS — C9110 Chronic lymphocytic leukemia of B-cell type not having achieved remission: Secondary | ICD-10-CM

## 2022-09-06 MED ORDER — IBRUTINIB 420 MG PO TABS
ORAL_TABLET | ORAL | 0 refills | Status: DC
  Filled 2022-09-06: qty 28, 28d supply, fill #0

## 2022-09-12 ENCOUNTER — Other Ambulatory Visit: Payer: Self-pay

## 2022-09-14 ENCOUNTER — Other Ambulatory Visit (HOSPITAL_COMMUNITY): Payer: Self-pay

## 2022-10-09 ENCOUNTER — Other Ambulatory Visit: Payer: Self-pay

## 2022-10-09 ENCOUNTER — Other Ambulatory Visit (HOSPITAL_COMMUNITY): Payer: Self-pay

## 2022-10-16 ENCOUNTER — Other Ambulatory Visit: Payer: Self-pay

## 2022-10-19 ENCOUNTER — Other Ambulatory Visit: Payer: Self-pay

## 2022-10-19 DIAGNOSIS — C9110 Chronic lymphocytic leukemia of B-cell type not having achieved remission: Secondary | ICD-10-CM

## 2022-10-20 ENCOUNTER — Other Ambulatory Visit: Payer: Self-pay

## 2022-10-20 ENCOUNTER — Inpatient Hospital Stay (HOSPITAL_BASED_OUTPATIENT_CLINIC_OR_DEPARTMENT_OTHER): Payer: PPO | Admitting: Hematology

## 2022-10-20 ENCOUNTER — Inpatient Hospital Stay: Payer: PPO | Attending: Oncology

## 2022-10-20 VITALS — BP 145/63 | HR 70 | Temp 98.3°F | Resp 20 | Wt 172.0 lb

## 2022-10-20 DIAGNOSIS — C9110 Chronic lymphocytic leukemia of B-cell type not having achieved remission: Secondary | ICD-10-CM | POA: Insufficient documentation

## 2022-10-20 DIAGNOSIS — Z79899 Other long term (current) drug therapy: Secondary | ICD-10-CM | POA: Insufficient documentation

## 2022-10-20 DIAGNOSIS — M069 Rheumatoid arthritis, unspecified: Secondary | ICD-10-CM | POA: Insufficient documentation

## 2022-10-20 DIAGNOSIS — L139 Bullous disorder, unspecified: Secondary | ICD-10-CM | POA: Insufficient documentation

## 2022-10-20 DIAGNOSIS — R609 Edema, unspecified: Secondary | ICD-10-CM | POA: Diagnosis not present

## 2022-10-20 LAB — CMP (CANCER CENTER ONLY)
ALT: 11 U/L (ref 0–44)
AST: 15 U/L (ref 15–41)
Albumin: 3.9 g/dL (ref 3.5–5.0)
Alkaline Phosphatase: 61 U/L (ref 38–126)
Anion gap: 6 (ref 5–15)
BUN: 24 mg/dL — ABNORMAL HIGH (ref 8–23)
CO2: 28 mmol/L (ref 22–32)
Calcium: 9.5 mg/dL (ref 8.9–10.3)
Chloride: 106 mmol/L (ref 98–111)
Creatinine: 0.98 mg/dL (ref 0.44–1.00)
GFR, Estimated: >60 mL/min (ref >60)
Glucose, Bld: 92 mg/dL (ref 70–99)
Potassium: 4.3 mmol/L (ref 3.5–5.1)
Sodium: 140 mmol/L (ref 135–145)
Total Bilirubin: 0.6 mg/dL (ref 0.3–1.2)
Total Protein: 5.9 g/dL — ABNORMAL LOW (ref 6.5–8.1)

## 2022-10-20 LAB — CBC WITH DIFFERENTIAL (CANCER CENTER ONLY)
Abs Immature Granulocytes: 0.02 10*3/uL (ref 0.00–0.07)
Basophils Absolute: 0.1 10*3/uL (ref 0.0–0.1)
Basophils Relative: 2 %
Eosinophils Absolute: 0.4 10*3/uL (ref 0.0–0.5)
Eosinophils Relative: 7 %
HCT: 40.3 % (ref 36.0–46.0)
Hemoglobin: 13 g/dL (ref 12.0–15.0)
Immature Granulocytes: 0 %
Lymphocytes Relative: 30 %
Lymphs Abs: 1.8 10*3/uL (ref 0.7–4.0)
MCH: 29.3 pg (ref 26.0–34.0)
MCHC: 32.3 g/dL (ref 30.0–36.0)
MCV: 91 fL (ref 80.0–100.0)
Monocytes Absolute: 0.8 10*3/uL (ref 0.1–1.0)
Monocytes Relative: 14 %
Neutro Abs: 2.9 10*3/uL (ref 1.7–7.7)
Neutrophils Relative %: 47 %
Platelet Count: 208 10*3/uL (ref 150–400)
RBC: 4.43 MIL/uL (ref 3.87–5.11)
RDW: 12.1 % (ref 11.5–15.5)
WBC Count: 6.1 10*3/uL (ref 4.0–10.5)
nRBC: 0 % (ref 0.0–0.2)

## 2022-10-20 MED ORDER — DOXYCYCLINE HYCLATE 100 MG PO TABS: 100.0000 mg | ORAL_TABLET | Freq: Two times a day (BID) | ORAL | 0 refills | Status: AC

## 2022-10-20 NOTE — Progress Notes (Signed)
HEMATOLOGY/ONCOLOGY CLINIC NOTE  Date of Service: 10/20/2022  Patient Care Team: Merri Brunette, MD as PCP - General (Family Medicine) Serena Colonel, MD as Consulting Physician (Otolaryngology)  CHIEF COMPLAINTS/PURPOSE OF CONSULTATION:  CLL (chronic lymphocytic leukemia) [facility])  Prior therapy:    She is status post rituximab weekly for 4 weeks completed on August 21, 2018. She is status post thoracentesis completed on August 12, 2019 with positive cytology. Rituximab 375 mg per metered square weekly started on October 13 of 2020 and completed on 03/04/2019.  This will be repeated every 3 months last treatment completed in March 2021. Bendamustine and rituximab started on Sep 03, 2019.  She is here for cycle 3 of therapy.  HISTORY OF PRESENTING ILLNESS:   Nicole Campbell is a wonderful 77 y.o. female who is here for continued evaluation and management of CLL. Patient was following up with Dr. Clelia Croft and has transferried her care to me. She was first diagnosed with CLL in 2019 where she presented with leukocytosis with p53 deletion.   Patient's current treatment is Ibrutinib 420 mg every other day. She was first started on Ibrutinib daily in July 2020, but was switched to Ibrutinib every other day in September 2023 due to presumed dermatological toxicity.  Patient notes that she was getting insect bites which was causing her bruising and scaring in September 2023, which is the reason Dr. Clelia Croft changed her treatment to Ibrutinib to every other day.   INTERVAL HISTORY:  Nicole Campbell is a 77 y.o. female here for continued evaluation and management of chronic lymphocytic leukemia. Patient was last seen by me on 08/11/2022 and complained of mild bumps/bruises, digestions issues, mild diarrhea, and finger stiffness.   Today, she is accompanied by her husband. She complain blisters in her bilateral lower and upper extremities  Patient did have a biopsy of her right lower extremity and  was told that she had elevated eosinophils in her skin tissue.  She denies endorsing any blistering skin lesions prior to ibrutinib and has not experienced any prior to her CLL diagnosis.   She reports that her skin blisters appear to begin similar to insect bites with a mild bumb. One of patient's largest blisters measures 3"x3"  in her left lower extremity. She complains of worsening facial bumps.  She does endorse severe itchiness with her skin blisters. She has applied alcohol, Benadryl creme, and cortisol which not improve symptoms. She generally avoids any sun exposure.   Patient reports that her RA is stable and does not follow rheumatologist at this time. She did previously receive infusions which improved her RA. She notes that Rituxan did not improve RA, but Ibrininib did clear her arthritis.   Patient reports edema in her left lower extremity and denies pain in the area. Her swelling does mildly improve during the nighttime. She does sometimes use eczema lotion.   She denies any fever, chills, night sweats, new lumps/bumps, new painful swollen joints, unexpected weight loss, or abdominal pain. Her energy levels have been stable.   MEDICAL HISTORY:  Past Medical History:  Diagnosis Date   CLL (chronic lymphocytic leukemia) (HCC)    Hypertension    Hypothyroid    MVP (mitral valve prolapse)    Rheumatoid arthritis (HCC)     SURGICAL HISTORY: Past Surgical History:  Procedure Laterality Date   ABDOMINAL HYSTERECTOMY     IR THORACENTESIS ASP PLEURAL SPACE W/IMG GUIDE  11/21/2019   IR THORACENTESIS ASP PLEURAL SPACE W/IMG GUIDE  07/05/2020  TONSILLECTOMY      SOCIAL HISTORY: Social History   Socioeconomic History   Marital status: Married    Spouse name: Not on file   Number of children: 3   Years of education: Not on file   Highest education level: Not on file  Occupational History   Not on file  Tobacco Use   Smoking status: Never   Smokeless tobacco: Never   Vaping Use   Vaping Use: Never used  Substance and Sexual Activity   Alcohol use: Yes    Comment: Rare   Drug use: Never   Sexual activity: Not on file  Other Topics Concern   Not on file  Social History Narrative   Not on file   Social Determinants of Health   Financial Resource Strain: Not on file  Food Insecurity: Not on file  Transportation Needs: Not on file  Physical Activity: Not on file  Stress: Not on file  Social Connections: Not on file  Intimate Partner Violence: Not on file    FAMILY HISTORY: Family History  Problem Relation Age of Onset   Congestive Heart Failure Father     ALLERGIES:  is allergic to methotrexate and erythromycin.  MEDICATIONS:  Current Outpatient Medications  Medication Sig Dispense Refill   acetaminophen (TYLENOL) 650 MG CR tablet Take 1,300 mg by mouth every 8 (eight) hours as needed for pain.     escitalopram (LEXAPRO) 5 MG tablet Take 5 mg by mouth daily.     feeding supplement (BOOST HIGH PROTEIN) LIQD Take 1 Container by mouth daily with breakfast.     furosemide (LASIX) 20 MG tablet Take 1 tablet (20 mg total) by mouth daily as needed. 60 tablet 11   ibrutinib (IMBRUVICA) 420 MG tablet TAKE 1 TABLET (420 MG) BY MOUTH DAILY. 28 tablet 0   levothyroxine (SYNTHROID, LEVOTHROID) 25 MCG tablet Take 25 mcg by mouth daily before breakfast.      Multiple Vitamin (MULTIVITAMIN ADULT PO) Take 1 tablet by mouth daily.     potassium chloride (KLOR-CON M) 10 MEQ tablet Take 1 tablet (10 mEq total) by mouth daily as needed. Pease take along with lasix if needed for legs swelling. 30 tablet 0   spironolactone (ALDACTONE) 25 MG tablet Take 1 tablet (25 mg total) by mouth daily. 30 tablet 2   No current facility-administered medications for this visit.    REVIEW OF SYSTEMS:    10 Point review of Systems was done is negative except as noted above.   PHYSICAL EXAMINATION: ECOG PERFORMANCE STATUS: 1 - Symptomatic but completely  ambulatory  . Vitals:   10/20/22 0955  BP: (!) 145/63  Pulse: 70  Resp: 20  Temp: 98.3 F (36.8 C)  SpO2: 99%     Filed Weights   10/20/22 0955  Weight: 172 lb (78 kg)     .Body mass index is 27.35 kg/m.   GENERAL:alert, in no acute distress and comfortable SKIN: no acute rashes, no significant lesions EYES: conjunctiva are pink and non-injected, sclera anicteric OROPHARYNX: MMM, no exudates, no oropharyngeal erythema or ulceration NECK: supple, no JVD LYMPH:  no palpable lymphadenopathy in the cervical, axillary or inguinal regions LUNGS: clear to auscultation b/l with normal respiratory effort HEART: regular rate & rhythm ABDOMEN:  normoactive bowel sounds , non tender, not distended. Extremity: no pedal edema PSYCH: alert & oriented x 3 with fluent speech NEURO: no focal motor/sensory deficits   LABORATORY DATA:  I have reviewed the data as listed  .  Latest Ref Rng & Units 10/20/2022    9:28 AM 08/11/2022    9:24 AM 06/05/2022    9:14 AM  CBC  WBC 4.0 - 10.5 K/uL 6.1  6.9  4.8   Hemoglobin 12.0 - 15.0 g/dL 16.1  09.6  04.5   Hematocrit 36.0 - 46.0 % 40.3  39.5  39.6   Platelets 150 - 400 K/uL 208  193  209    .    Latest Ref Rng & Units 08/11/2022    9:24 AM 06/05/2022    9:14 AM 04/06/2022   10:06 AM  CMP  Glucose 70 - 99 mg/dL 94  91  96   BUN 8 - 23 mg/dL 29  25  18    Creatinine 0.44 - 1.00 mg/dL 4.09  8.11  9.14   Sodium 135 - 145 mmol/L 141  140  143   Potassium 3.5 - 5.1 mmol/L 4.6  4.2  4.2   Chloride 98 - 111 mmol/L 106  106  107   CO2 22 - 32 mmol/L 32  31  32   Calcium 8.9 - 10.3 mg/dL 9.6  9.4  9.5   Total Protein 6.5 - 8.1 g/dL 6.0  5.6  5.6   Total Bilirubin 0.3 - 1.2 mg/dL 0.6  0.6  0.4   Alkaline Phos 38 - 126 U/L 59  65  73   AST 15 - 41 U/L 15  16  17    ALT 0 - 44 U/L 16  16  16      RADIOGRAPHIC STUDIES: I have personally reviewed the radiological images as listed and agreed with the findings in the report. No results  found.  ASSESSMENT & PLAN:   77 year old woman with:   1.  CLL presented with lymphocytosis and adenopathy diagnosed in 2019. 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  Rheumatoid arthritis: She remains without any recent exacerbation.  3.  Bullous lesions on her upper and lower extremities: Possibly related to skin reaction associated with Irbutinib or the patient autoimmune disorder (RA)  PLAN:  -Discussed lab results on 10/20/2022 in detail with patient. CBC normal, showed WBC of 6.1K, hemoglobin of 13.0, and platelets of 208K. -will hold 420 mg Ibrutinib at this time to evaluate for any role in the development of skin blisters. No other toxicity issues with 420 mg Ibrutinib.  -informed patient that some skin reactions may be idiosyncratic -Lower extremity edema may be concerning for acute infection. Redness in the area may be due to local trauma, but may progress to cellulitis -will order Doxycycline to address left lower extremity cellulitis. -informed patient that venous insufficiency does increase the risk of skin breakdown -recommend patient to use compression socks to reduce the risk of stasis dermatitis -recommend patient to receive Shingrix vaccination due to age and CLL -informed patient that scratching blisters with fingers may cause secondary injuries, would recommend spreading cream with the palm to avoid any scarring -informed patient that alcohol may worsen skin blisters -recommend anti-itch Sarna lotion, spray with calamine and lidocaine, or burn gel with allovera to improve skin blisters -informed patient that she may endorse autoimmune conditions such as rheumitoid arthritis or Bullous pemphigoid -advised patient to regularly use sun protection -will monitor with labs in 6-8 weeks -continue to follow with dermatologist, who may consider a biopsy for further evaluation  FOLLOW-UP: RTC with Dr Candise Che with labs in 8 weeks  The total time spent in the appointment was 25  minutes* .  All  of the patient's questions were answered with apparent satisfaction. The patient knows to call the clinic with any problems, questions or concerns.   Wyvonnia Lora MD MS AAHIVMS [facility] Hematology/Oncology Physician [facility]  .*Total Encounter Time as defined by the Centers for Medicare and Medicaid Services includes, in addition to the face-to-face time of a patient visit (documented in the note above) non-face-to-face time: obtaining and reviewing outside history, ordering and reviewing medications, tests or procedures, care coordination (communications with other health care professionals or caregivers) and documentation in the medical record.    I,Mitra Faeizi,acting as a Neurosurgeon for Wyvonnia Lora, MD.,have documented all relevant documentation on the behalf of Wyvonnia Lora, MD,as directed by  Wyvonnia Lora, MD while in the presence of Wyvonnia Lora, MD.  .I have reviewed the above documentation for accuracy and completeness, and I agree with the above. Johney Maine MD

## 2022-10-23 ENCOUNTER — Telehealth: Payer: Self-pay | Admitting: Hematology

## 2022-10-23 ENCOUNTER — Other Ambulatory Visit: Payer: Self-pay

## 2022-11-08 DIAGNOSIS — L821 Other seborrheic keratosis: Secondary | ICD-10-CM | POA: Diagnosis not present

## 2022-11-10 ENCOUNTER — Encounter: Payer: Self-pay | Admitting: Hematology

## 2022-11-14 ENCOUNTER — Telehealth: Payer: Self-pay

## 2022-11-14 NOTE — Telephone Encounter (Signed)
Contacted pt regarding MyChart message and pt stated her rash had totally resolved.

## 2022-11-23 DIAGNOSIS — K5900 Constipation, unspecified: Secondary | ICD-10-CM | POA: Diagnosis not present

## 2022-11-23 DIAGNOSIS — C9110 Chronic lymphocytic leukemia of B-cell type not having achieved remission: Secondary | ICD-10-CM | POA: Diagnosis not present

## 2022-11-23 DIAGNOSIS — Z0000 Encounter for general adult medical examination without abnormal findings: Secondary | ICD-10-CM | POA: Diagnosis not present

## 2022-11-23 DIAGNOSIS — Z1331 Encounter for screening for depression: Secondary | ICD-10-CM | POA: Diagnosis not present

## 2022-11-23 DIAGNOSIS — R519 Headache, unspecified: Secondary | ICD-10-CM | POA: Diagnosis not present

## 2022-11-23 DIAGNOSIS — F419 Anxiety disorder, unspecified: Secondary | ICD-10-CM | POA: Diagnosis not present

## 2022-11-23 DIAGNOSIS — E7800 Pure hypercholesterolemia, unspecified: Secondary | ICD-10-CM | POA: Diagnosis not present

## 2022-11-23 DIAGNOSIS — M069 Rheumatoid arthritis, unspecified: Secondary | ICD-10-CM | POA: Diagnosis not present

## 2022-11-23 DIAGNOSIS — E039 Hypothyroidism, unspecified: Secondary | ICD-10-CM | POA: Diagnosis not present

## 2022-12-19 ENCOUNTER — Other Ambulatory Visit: Payer: Self-pay

## 2022-12-19 DIAGNOSIS — C9110 Chronic lymphocytic leukemia of B-cell type not having achieved remission: Secondary | ICD-10-CM

## 2022-12-20 ENCOUNTER — Inpatient Hospital Stay (HOSPITAL_BASED_OUTPATIENT_CLINIC_OR_DEPARTMENT_OTHER): Payer: PPO | Admitting: Hematology

## 2022-12-20 ENCOUNTER — Inpatient Hospital Stay: Payer: PPO | Attending: Oncology

## 2022-12-20 VITALS — BP 126/63 | HR 68 | Temp 98.2°F | Resp 18 | Ht 66.5 in | Wt 167.6 lb

## 2022-12-20 DIAGNOSIS — M069 Rheumatoid arthritis, unspecified: Secondary | ICD-10-CM | POA: Insufficient documentation

## 2022-12-20 DIAGNOSIS — L139 Bullous disorder, unspecified: Secondary | ICD-10-CM | POA: Insufficient documentation

## 2022-12-20 DIAGNOSIS — C9110 Chronic lymphocytic leukemia of B-cell type not having achieved remission: Secondary | ICD-10-CM | POA: Diagnosis not present

## 2022-12-20 DIAGNOSIS — Z79899 Other long term (current) drug therapy: Secondary | ICD-10-CM | POA: Diagnosis not present

## 2022-12-20 LAB — CMP (CANCER CENTER ONLY)
ALT: 18 U/L (ref 0–44)
AST: 19 U/L (ref 15–41)
Albumin: 4.1 g/dL (ref 3.5–5.0)
Alkaline Phosphatase: 86 U/L (ref 38–126)
Anion gap: 2 — ABNORMAL LOW (ref 5–15)
BUN: 25 mg/dL — ABNORMAL HIGH (ref 8–23)
CO2: 33 mmol/L — ABNORMAL HIGH (ref 22–32)
Calcium: 9.6 mg/dL (ref 8.9–10.3)
Chloride: 106 mmol/L (ref 98–111)
Creatinine: 0.95 mg/dL (ref 0.44–1.00)
GFR, Estimated: >60 mL/min (ref >60)
Glucose, Bld: 95 mg/dL (ref 70–99)
Potassium: 4.7 mmol/L (ref 3.5–5.1)
Sodium: 141 mmol/L (ref 135–145)
Total Bilirubin: 0.5 mg/dL (ref 0.3–1.2)
Total Protein: 6.3 g/dL — ABNORMAL LOW (ref 6.5–8.1)

## 2022-12-20 LAB — CBC WITH DIFFERENTIAL (CANCER CENTER ONLY)
Abs Immature Granulocytes: 0.01 10*3/uL (ref 0.00–0.07)
Basophils Absolute: 0.1 10*3/uL (ref 0.0–0.1)
Basophils Relative: 1 %
Eosinophils Absolute: 0.5 10*3/uL (ref 0.0–0.5)
Eosinophils Relative: 10 %
HCT: 39.9 % (ref 36.0–46.0)
Hemoglobin: 13.1 g/dL (ref 12.0–15.0)
Immature Granulocytes: 0 %
Lymphocytes Relative: 32 %
Lymphs Abs: 1.7 10*3/uL (ref 0.7–4.0)
MCH: 29.7 pg (ref 26.0–34.0)
MCHC: 32.8 g/dL (ref 30.0–36.0)
MCV: 90.5 fL (ref 80.0–100.0)
Monocytes Absolute: 0.7 10*3/uL (ref 0.1–1.0)
Monocytes Relative: 12 %
Neutro Abs: 2.4 10*3/uL (ref 1.7–7.7)
Neutrophils Relative %: 45 %
Platelet Count: 192 10*3/uL (ref 150–400)
RBC: 4.41 MIL/uL (ref 3.87–5.11)
RDW: 12.7 % (ref 11.5–15.5)
WBC Count: 5.4 10*3/uL (ref 4.0–10.5)
nRBC: 0 % (ref 0.0–0.2)

## 2022-12-20 NOTE — Progress Notes (Signed)
HEMATOLOGY/ONCOLOGY CLINIC NOTE  Date of Service: 12/20/2022  Patient Care Team: Merri Brunette, MD as PCP - General (Family Medicine) Serena Colonel, MD as Consulting Physician (Otolaryngology)  CHIEF COMPLAINTS/PURPOSE OF CONSULTATION:  CLL (chronic lymphocytic leukemia) [facility])  Prior therapy:    She is status post rituximab weekly for 4 weeks completed on August 21, 2018. She is status post thoracentesis completed on August 12, 2019 with positive cytology. Rituximab 375 mg per metered square weekly started on October 13 of 2020 and completed on 03/04/2019.  This will be repeated every 3 months last treatment completed in March 2021. Bendamustine and rituximab started on Sep 03, 2019.  She is here for cycle 3 of therapy.  HISTORY OF PRESENTING ILLNESS:   Nicole Campbell is a wonderful 77 y.o. female who is here for continued evaluation and management of CLL. Patient was following up with Dr. Clelia Croft and has transferried her care to me. She was first diagnosed with CLL in 2019 where she presented with leukocytosis with p53 deletion.   Patient's current treatment is Ibrutinib 420 mg every other day. She was first started on Ibrutinib daily in July 2020, but was switched to Ibrutinib every other day in September 2023 due to presumed dermatological toxicity.  Patient notes that she was getting insect bites which was causing her bruising and scaring in September 2023, which is the reason Dr. Clelia Croft changed her treatment to Ibrutinib to every other day.   INTERVAL HISTORY:  Nicole Campbell is a 77 y.o. female here for continued evaluation and management of chronic lymphocytic leukemia. Patient was last seen by me on 10/20/2022 and complained of blisters in bilateral lower and upper extremities with severe itchiness, worsening facial bumps, and edema in her left lower extremity. She reported that she had a recent biopsy of her right lower extremity and was told to have elevated eosinophils  in her skin tissue.   Today, she is accompanied by her husband. Patient complains of worsened skin blisters in lower and upper extremities. She reports that scarring tends to occur in her legs only. Patient notes that stopping Ibrutinib did not improve symptoms. She does endorse some itchiness with her skin lesions which she manages with cortisone.   Her RA has been stable and does not take any medication to manage this. Patient denies any joint issues or significant fatigue. Her energy levels fairly stable and she notes that she recently began taking a multivitamin.   She was seen by a GP and was told that there were findings of a mild sound in her lungs after auscultation. Patient complains of congestion since last month with clear rhinorrhea and headache symptoms. She denies any fever, chills, night sweats, or abdominal pain/distention.   Patient reports that she will be traveling to [place] on September 1st, 2024 to attend a wedding.  MEDICAL HISTORY:  Past Medical History:  Diagnosis Date   CLL (chronic lymphocytic leukemia) (HCC)    Hypertension    Hypothyroid    MVP (mitral valve prolapse)    Rheumatoid arthritis (HCC)     SURGICAL HISTORY: Past Surgical History:  Procedure Laterality Date   ABDOMINAL HYSTERECTOMY     IR THORACENTESIS ASP PLEURAL SPACE W/IMG GUIDE  11/21/2019   IR THORACENTESIS ASP PLEURAL SPACE W/IMG GUIDE  07/05/2020   TONSILLECTOMY      SOCIAL HISTORY: Social History   Socioeconomic History   Marital status: Married    Spouse name: Not on file   Number of  children: 3   Years of education: Not on file   Highest education level: Not on file  Occupational History   Not on file  Tobacco Use   Smoking status: Never   Smokeless tobacco: Never  Vaping Use   Vaping status: Never Used  Substance and Sexual Activity   Alcohol use: Yes    Comment: Rare   Drug use: Never   Sexual activity: Not on file  Other Topics Concern   Not on file  Social  History Narrative   Not on file   Social Determinants of Health   Financial Resource Strain: Not on file  Food Insecurity: Not on file  Transportation Needs: Not on file  Physical Activity: Not on file  Stress: Not on file  Social Connections: Not on file  Intimate Partner Violence: Not on file    FAMILY HISTORY: Family History  Problem Relation Age of Onset   Congestive Heart Failure Father     ALLERGIES:  is allergic to methotrexate and erythromycin.  MEDICATIONS:  Current Outpatient Medications  Medication Sig Dispense Refill   acetaminophen (TYLENOL) 650 MG CR tablet Take 1,300 mg by mouth every 8 (eight) hours as needed for pain.     escitalopram (LEXAPRO) 5 MG tablet Take 5 mg by mouth daily.     feeding supplement (BOOST HIGH PROTEIN) LIQD Take 1 Container by mouth daily with breakfast.     furosemide (LASIX) 20 MG tablet Take 1 tablet (20 mg total) by mouth daily as needed. 60 tablet 11   ibrutinib (IMBRUVICA) 420 MG tablet TAKE 1 TABLET (420 MG) BY MOUTH DAILY. 28 tablet 0   levothyroxine (SYNTHROID, LEVOTHROID) 25 MCG tablet Take 25 mcg by mouth daily before breakfast.      Multiple Vitamin (MULTIVITAMIN ADULT PO) Take 1 tablet by mouth daily.     potassium chloride (KLOR-CON M) 10 MEQ tablet Take 1 tablet (10 mEq total) by mouth daily as needed. Pease take along with lasix if needed for legs swelling. 30 tablet 0   spironolactone (ALDACTONE) 25 MG tablet Take 1 tablet (25 mg total) by mouth daily. 30 tablet 2   No current facility-administered medications for this visit.    REVIEW OF SYSTEMS:    10 Point review of Systems was done is negative except as noted above.   PHYSICAL EXAMINATION: ECOG PERFORMANCE STATUS: 1 - Symptomatic but completely ambulatory  Vitals:   12/20/22 1043  BP: 126/63  Pulse: 68  Resp: 18  Temp: 98.2 F (36.8 C)  SpO2: 99%    Filed Weights   12/20/22 1043  Weight: 167 lb 9.6 oz (76 kg)    .Body mass index is 26.65  kg/m.   GENERAL:alert, in no acute distress and comfortable SKIN: no acute rashes, no significant lesions EYES: conjunctiva are pink and non-injected, sclera anicteric OROPHARYNX: MMM, no exudates, no oropharyngeal erythema or ulceration NECK: supple, no JVD LYMPH:  no palpable lymphadenopathy in the cervical, axillary or inguinal regions LUNGS: clear to auscultation b/l with normal respiratory effort HEART: regular rate & rhythm ABDOMEN:  normoactive bowel sounds , non tender, not distended. Extremity: no pedal edema PSYCH: alert & oriented x 3 with fluent speech NEURO: no focal motor/sensory deficits   LABORATORY DATA:  I have reviewed the data as listed  .    Latest Ref Rng & Units 12/20/2022   10:18 AM 10/20/2022    9:28 AM 08/11/2022    9:24 AM  CBC  WBC 4.0 - 10.5 K/uL  5.4  6.1  6.9   Hemoglobin 12.0 - 15.0 g/dL 52.8  41.3  24.4   Hematocrit 36.0 - 46.0 % 39.9  40.3  39.5   Platelets 150 - 400 K/uL 192  208  193    .    Latest Ref Rng & Units 12/20/2022   10:18 AM 10/20/2022    9:28 AM 08/11/2022    9:24 AM  CMP  Glucose 70 - 99 mg/dL 95  92  94   BUN 8 - 23 mg/dL 25  24  29    Creatinine 0.44 - 1.00 mg/dL 0.10  2.72  5.36   Sodium 135 - 145 mmol/L 141  140  141   Potassium 3.5 - 5.1 mmol/L 4.7  4.3  4.6   Chloride 98 - 111 mmol/L 106  106  106   CO2 22 - 32 mmol/L 33  28  32   Calcium 8.9 - 10.3 mg/dL 9.6  9.5  9.6   Total Protein 6.5 - 8.1 g/dL 6.3  5.9  6.0   Total Bilirubin 0.3 - 1.2 mg/dL 0.5  0.6  0.6   Alkaline Phos 38 - 126 U/L 86  61  59   AST 15 - 41 U/L 19  15  15    ALT 0 - 44 U/L 18  11  16      RADIOGRAPHIC STUDIES: I have personally reviewed the radiological images as listed and agreed with the findings in the report. No results found.  ASSESSMENT & PLAN:   77 year old woman with:   1.  CLL presented with lymphocytosis and adenopathy diagnosed in 2019. 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  Rheumatoid arthritis: She remains without any  recent exacerbation.  3.  Bullous lesions on her upper and lower extremities: Possibly related to skin reaction associated with Irbutinib or the patient autoimmune disorder (RA)  PLAN:  -Discussed lab results on 12/20/2022 in detail with patient. CBC normal, showed WBC of 5.4K, hemoglobin of 13.1, and platelets of 192K. -CMP stable -her skin lesions are not a primary toxicity of ibrutinib as she has been off of Ibrutinib for two months and her skin blisters continue to persist.  -will continue to hold 420 mg Ibrutinib at this time as CLL has been stable. Will plan for CT scan in a couple months. Reasonable to monitor CLL unless any repeat treatment is needed -patient's skin lesions may be related to an autoimmune process related to RA -will order imaging study for further evaluation -okay to receive RSV and influenza vaccine -answered all of patient's questions in detail -Patient has a scheduled appointment with dermatologist on January 15, 2023 -continue to follow with rheumatologist and dermatologist for further evaluation and management  FOLLOW-UP: CT chest/abd/pelvis in 10 weeks RTC with Dr Candise Che with labs in 12 weeks  The total time spent in the appointment was 21 minutes* .  All of the patient's questions were answered with apparent satisfaction. The patient knows to call the clinic with any problems, questions or concerns.   Wyvonnia Lora MD MS AAHIVMS [facility] Hematology/Oncology Physician [facility]  .*Total Encounter Time as defined by the Centers for Medicare and Medicaid Services includes, in addition to the face-to-face time of a patient visit (documented in the note above) non-face-to-face time: obtaining and reviewing outside history, ordering and reviewing medications, tests or procedures, care coordination (communications with other health care professionals or caregivers) and documentation in the medical record.    I,Mitra Faeizi,acting  as a Neurosurgeon for  Wyvonnia Lora, MD.,have documented all relevant documentation on the behalf of Wyvonnia Lora, MD,as directed by  Wyvonnia Lora, MD while in the presence of Wyvonnia Lora, MD.  .I have reviewed the above documentation for accuracy and completeness, and I agree with the above. Johney Maine MD

## 2022-12-21 ENCOUNTER — Encounter: Payer: Self-pay | Admitting: Hematology

## 2022-12-22 ENCOUNTER — Telehealth: Payer: Self-pay | Admitting: Hematology

## 2022-12-22 NOTE — Telephone Encounter (Signed)
Patient is aware of scheduled appointment times/dates

## 2023-01-15 DIAGNOSIS — D485 Neoplasm of uncertain behavior of skin: Secondary | ICD-10-CM | POA: Diagnosis not present

## 2023-01-15 DIAGNOSIS — L308 Other specified dermatitis: Secondary | ICD-10-CM | POA: Diagnosis not present

## 2023-02-20 ENCOUNTER — Other Ambulatory Visit: Payer: Self-pay | Admitting: Family Medicine

## 2023-02-20 DIAGNOSIS — Z1231 Encounter for screening mammogram for malignant neoplasm of breast: Secondary | ICD-10-CM

## 2023-02-28 ENCOUNTER — Ambulatory Visit (HOSPITAL_COMMUNITY)
Admission: RE | Admit: 2023-02-28 | Discharge: 2023-02-28 | Disposition: A | Discharge status: Home / Self Care | Payer: PPO | Source: Ambulatory Visit | Attending: Hematology | Admitting: Hematology

## 2023-02-28 ENCOUNTER — Encounter (HOSPITAL_COMMUNITY): Payer: Self-pay

## 2023-02-28 DIAGNOSIS — J90 Pleural effusion, not elsewhere classified: Secondary | ICD-10-CM | POA: Diagnosis not present

## 2023-02-28 DIAGNOSIS — C9110 Chronic lymphocytic leukemia of B-cell type not having achieved remission: Secondary | ICD-10-CM | POA: Insufficient documentation

## 2023-02-28 DIAGNOSIS — R590 Localized enlarged lymph nodes: Secondary | ICD-10-CM | POA: Diagnosis not present

## 2023-02-28 MED ORDER — IOHEXOL 300 MG/ML  SOLN
100.0000 mL | Freq: Once | INTRAMUSCULAR | Status: AC | PRN
  Administered 2023-02-28: 100 mL via INTRAVENOUS

## 2023-02-28 NOTE — Progress Notes (Signed)
Pt s/p CT this AM with subsequent extravasation of saline/contrast (approx 20 mL) into right dorsal wrist venous acess site.  Area currently soft but edematous, NT, intact distal pulses and neuro fxn ok.  Recommend cool compress (can alternate with warm compresses as needed) to site and arm elevation as much as possible over next 24 hrs to promote adequate venous drainage.  Mark margins of affected area with pen. RN made aware.  Will follow-up tomorrow.    Loyce Dys, MS RD PA-C

## 2023-03-07 DIAGNOSIS — B029 Zoster without complications: Secondary | ICD-10-CM | POA: Diagnosis not present

## 2023-03-09 ENCOUNTER — Other Ambulatory Visit: Payer: Self-pay

## 2023-03-09 DIAGNOSIS — C9110 Chronic lymphocytic leukemia of B-cell type not having achieved remission: Secondary | ICD-10-CM

## 2023-03-12 ENCOUNTER — Inpatient Hospital Stay: Payer: PPO | Attending: Oncology | Admitting: Hematology

## 2023-03-12 ENCOUNTER — Other Ambulatory Visit: Payer: Self-pay

## 2023-03-12 ENCOUNTER — Emergency Department (HOSPITAL_COMMUNITY)
Admission: EM | Admit: 2023-03-12 | Discharge: 2023-03-12 | Discharge status: Against Medical Advice | Payer: PPO | Attending: Emergency Medicine | Admitting: Emergency Medicine

## 2023-03-12 ENCOUNTER — Inpatient Hospital Stay: Payer: PPO

## 2023-03-12 ENCOUNTER — Encounter (HOSPITAL_COMMUNITY): Payer: Self-pay

## 2023-03-12 VITALS — BP 128/64 | HR 95 | Temp 97.7°F | Resp 17 | Wt 165.0 lb

## 2023-03-12 DIAGNOSIS — Z5321 Procedure and treatment not carried out due to patient leaving prior to being seen by health care provider: Secondary | ICD-10-CM | POA: Diagnosis not present

## 2023-03-12 DIAGNOSIS — R5383 Other fatigue: Secondary | ICD-10-CM | POA: Diagnosis not present

## 2023-03-12 DIAGNOSIS — C9110 Chronic lymphocytic leukemia of B-cell type not having achieved remission: Secondary | ICD-10-CM

## 2023-03-12 DIAGNOSIS — R531 Weakness: Secondary | ICD-10-CM | POA: Diagnosis not present

## 2023-03-12 LAB — CBC WITH DIFFERENTIAL (CANCER CENTER ONLY)
Abs Immature Granulocytes: 0.04 10*3/uL (ref 0.00–0.07)
Basophils Absolute: 0 10*3/uL (ref 0.0–0.1)
Basophils Relative: 0 %
Eosinophils Absolute: 0.3 10*3/uL (ref 0.0–0.5)
Eosinophils Relative: 6 %
HCT: 38.6 % (ref 36.0–46.0)
Hemoglobin: 13 g/dL (ref 12.0–15.0)
Immature Granulocytes: 1 %
Lymphocytes Relative: 35 %
Lymphs Abs: 1.8 10*3/uL (ref 0.7–4.0)
MCH: 29.5 pg (ref 26.0–34.0)
MCHC: 33.7 g/dL (ref 30.0–36.0)
MCV: 87.7 fL (ref 80.0–100.0)
Monocytes Absolute: 0.2 10*3/uL (ref 0.1–1.0)
Monocytes Relative: 3 %
Neutro Abs: 3 10*3/uL (ref 1.7–7.7)
Neutrophils Relative %: 55 %
Platelet Count: 196 10*3/uL (ref 150–400)
RBC: 4.4 MIL/uL (ref 3.87–5.11)
RDW: 13 % (ref 11.5–15.5)
WBC Count: 5.3 10*3/uL (ref 4.0–10.5)
nRBC: 0 % (ref 0.0–0.2)

## 2023-03-12 LAB — CMP (CANCER CENTER ONLY)
ALT: 46 U/L — ABNORMAL HIGH (ref 0–44)
AST: 26 U/L (ref 15–41)
Albumin: 3.6 g/dL (ref 3.5–5.0)
Alkaline Phosphatase: 100 U/L (ref 38–126)
Anion gap: 4 — ABNORMAL LOW (ref 5–15)
BUN: 21 mg/dL (ref 8–23)
CO2: 34 mmol/L — ABNORMAL HIGH (ref 22–32)
Calcium: 12.6 mg/dL — ABNORMAL HIGH (ref 8.9–10.3)
Chloride: 97 mmol/L — ABNORMAL LOW (ref 98–111)
Creatinine: 1.1 mg/dL — ABNORMAL HIGH (ref 0.44–1.00)
GFR, Estimated: 52 mL/min — ABNORMAL LOW (ref >60)
Glucose, Bld: 127 mg/dL — ABNORMAL HIGH (ref 70–99)
Potassium: 4 mmol/L (ref 3.5–5.1)
Sodium: 135 mmol/L (ref 135–145)
Total Bilirubin: 0.5 mg/dL (ref <1.2)
Total Protein: 5.9 g/dL — ABNORMAL LOW (ref 6.5–8.1)

## 2023-03-12 NOTE — ED Triage Notes (Signed)
Patient is here for evaluation of elevated calcium level. Reports Oncologist wanted her to be seen here. Pt reports fatigue.

## 2023-03-12 NOTE — ED Provider Triage Note (Signed)
Emergency Medicine Provider Triage Evaluation Note  Nicole Campbell , a 77 y.o. female  was evaluated in triage.  Pt complains of high calcium.  Review of Systems  Positive: Feels generally weak - being treated for shingles Negative: Fever, vomiting, pain  Physical Exam  BP 126/75 (BP Location: Left Arm)   Pulse 88   Temp 98.4 F (36.9 C) (Oral)   Resp 18   Ht 5' 6.5" (1.689 m)   Wt 74.8 kg   SpO2 99%   BMI 26.22 kg/m  Gen:   Awake, no distress   Resp:  Normal effort  MSK:   Moves extremities without difficulty  Other:    Medical Decision Making  Medically screening exam initiated at 2:40 PM.  Appropriate orders placed.  Nicole Campbell was informed that the remainder of the evaluation will be completed by another provider, this initial triage assessment does not replace that evaluation, and the importance of remaining in the ED until their evaluation is complete.  Sent from cancer center for further evaluation and management of calcium. Labs obtained at cancer center for review.    Nicole Anis, PA-C 03/12/23 1441

## 2023-03-12 NOTE — Progress Notes (Signed)
HEMATOLOGY/ONCOLOGY CLINIC NOTE  Date of Service: 03/12/2023  Patient Care Team: Merri Brunette, MD as PCP - General (Family Medicine) Serena Colonel, MD as Consulting Physician (Otolaryngology)  CHIEF COMPLAINTS/PURPOSE OF CONSULTATION:  CLL (chronic lymphocytic leukemia) [facility])  Prior therapy:    She is status post rituximab weekly for 4 weeks completed on August 21, 2018. She is status post thoracentesis completed on August 12, 2019 with positive cytology. Rituximab 375 mg per metered square weekly started on October 13 of 2020 and completed on 03/04/2019.  This will be repeated every 3 months last treatment completed in March 2021. Bendamustine and rituximab started on Sep 03, 2019.  She is here for cycle 3 of therapy.  HISTORY OF PRESENTING ILLNESS:   Nicole Campbell is a wonderful 77 y.o. female who is here for continued evaluation and management of CLL. Patient was following up with Dr. Clelia Croft and has transferried her care to me. She was first diagnosed with CLL in 2019 where she presented with leukocytosis with p53 deletion.   Patient's current treatment is Ibrutinib 420 mg every other day. She was first started on Ibrutinib daily in July 2020, but was switched to Ibrutinib every other day in September 2023 due to presumed dermatological toxicity.  Patient notes that she was getting insect bites which was causing her bruising and scaring in September 2023, which is the reason Dr. Clelia Croft changed her treatment to Ibrutinib to every other day.   INTERVAL HISTORY:  Nicole Campbell is a 77 y.o. female here for continued evaluation and management of chronic lymphocytic leukemia.   Patient was last seen by me on 12/20/2022 and she complained of worsened skin blisters in lower and upper extremities, mild occasional itchiness with skin lesions, congestion, and headache.   Patient notes she has been doing well overall since our last visit. She notes that she currently has Shingles  below her chest area and throughout her back. She denies pain or burning sensation. She does complain of fatigue, lethargy, appetite loss, and weakness.   Patient has been prescribed Valtrex 500 mg 3 times daily for her shingles. She was prescribed the medication around last week. She has past medical history of chicken pox. She did not receive Shingles vaccine.   Patient also complains of left-sided headaches, which started when she got Shingles.   She denies any fever, chills, night sweats, bone pain, back pain, chest pain, abdominal pain, or leg swelling. She has lost around 2-3 lbs since our last visit.   She is not taking vitamin-D nor calcium supplement.  Patient denies any past medical history of parathyroid issues. Patient notes she has been staying well-hydrated.   Patient notes she did have another skin lesion om her right leg, which was biopsied by her Dermatologist. Biopsy results are not showing up in care-everywhere and patient is unsure of the results.   MEDICAL HISTORY:  Past Medical History:  Diagnosis Date   CLL (chronic lymphocytic leukemia) (HCC)    Hypertension    Hypothyroid    MVP (mitral valve prolapse)    Rheumatoid arthritis (HCC)     SURGICAL HISTORY: Past Surgical History:  Procedure Laterality Date   ABDOMINAL HYSTERECTOMY     IR THORACENTESIS ASP PLEURAL SPACE W/IMG GUIDE  11/21/2019   IR THORACENTESIS ASP PLEURAL SPACE W/IMG GUIDE  07/05/2020   TONSILLECTOMY      SOCIAL HISTORY: Social History   Socioeconomic History   Marital status: Married    Spouse name: Not  on file   Number of children: 3   Years of education: Not on file   Highest education level: Not on file  Occupational History   Not on file  Tobacco Use   Smoking status: Never   Smokeless tobacco: Never  Vaping Use   Vaping status: Never Used  Substance and Sexual Activity   Alcohol use: Yes    Comment: Rare   Drug use: Never   Sexual activity: Not on file  Other Topics  Concern   Not on file  Social History Narrative   Not on file   Social Determinants of Health   Financial Resource Strain: Not on file  Food Insecurity: Not on file  Transportation Needs: Not on file  Physical Activity: Not on file  Stress: Not on file  Social Connections: Not on file  Intimate Partner Violence: Not on file    FAMILY HISTORY: Family History  Problem Relation Age of Onset   Congestive Heart Failure Father     ALLERGIES:  is allergic to methotrexate and erythromycin.  MEDICATIONS:  Current Outpatient Medications  Medication Sig Dispense Refill   acetaminophen (TYLENOL) 650 MG CR tablet Take 1,300 mg by mouth every 8 (eight) hours as needed for pain.     escitalopram (LEXAPRO) 5 MG tablet Take 5 mg by mouth daily.     feeding supplement (BOOST HIGH PROTEIN) LIQD Take 1 Container by mouth daily with breakfast.     furosemide (LASIX) 20 MG tablet Take 1 tablet (20 mg total) by mouth daily as needed. 60 tablet 11   ibrutinib (IMBRUVICA) 420 MG tablet TAKE 1 TABLET (420 MG) BY MOUTH DAILY. 28 tablet 0   levothyroxine (SYNTHROID, LEVOTHROID) 25 MCG tablet Take 25 mcg by mouth daily before breakfast.      Multiple Vitamin (MULTIVITAMIN ADULT PO) Take 1 tablet by mouth daily.     potassium chloride (KLOR-CON M) 10 MEQ tablet Take 1 tablet (10 mEq total) by mouth daily as needed. Pease take along with lasix if needed for legs swelling. 30 tablet 0   spironolactone (ALDACTONE) 25 MG tablet Take 1 tablet (25 mg total) by mouth daily. 30 tablet 2   No current facility-administered medications for this visit.    REVIEW OF SYSTEMS:    10 Point review of Systems was done is negative except as noted above.   PHYSICAL EXAMINATION: ECOG PERFORMANCE STATUS: 1 - Symptomatic but completely ambulatory  There were no vitals filed for this visit.   Filed Weights   03/12/23 1252  Weight: 165 lb (74.8 kg)     .Body mass index is 26.23 kg/m.   GENERAL:alert, in no  acute distress and comfortable SKIN: no acute rashes, no significant lesions EYES: conjunctiva are pink and non-injected, sclera anicteric OROPHARYNX: MMM, no exudates, no oropharyngeal erythema or ulceration NECK: supple, no JVD LYMPH:  no palpable lymphadenopathy in the cervical, axillary or inguinal regions LUNGS: clear to auscultation b/l with normal respiratory effort HEART: regular rate & rhythm ABDOMEN:  normoactive bowel sounds , non tender, not distended. Extremity: no pedal edema PSYCH: alert & oriented x 3 with fluent speech NEURO: no focal motor/sensory deficits   LABORATORY DATA:  I have reviewed the data as listed Component     Latest Ref Rng 03/12/2023  WBC     4.0 - 10.5 K/uL 5.3   RBC     3.87 - 5.11 MIL/uL 4.40   Hemoglobin     12.0 - 15.0 g/dL 13.0  HCT     36.0 - 46.0 % 38.6   MCV     80.0 - 100.0 fL 87.7   MCH     26.0 - 34.0 pg 29.5   MCHC     30.0 - 36.0 g/dL 40.1   RDW     02.7 - 25.3 % 13.0   Platelets     150 - 400 K/uL 196   nRBC     0.0 - 0.2 % 0.0   Neutrophils     % 55   NEUT#     1.7 - 7.7 K/uL 3.0   Lymphocytes     % 35   Lymphs Abs     0.7 - 4.0 K/uL 1.8   Monocytes Relative     % 3   Monocyte #     0.1 - 1.0 K/uL 0.2   Eosinophil     % 6   Eosinophils Absolute     0.0 - 0.5 K/uL 0.3   Basophil     % 0   Basophils Absolute     0.0 - 0.1 K/uL 0.0   Immature Granulocytes     % 1   Abs Immature Granulocytes     0.00 - 0.07 K/uL 0.04   Sodium     135 - 145 mmol/L 135   Potassium     3.5 - 5.1 mmol/L 4.0   Chloride     98 - 111 mmol/L 97 (L)   CO2     22 - 32 mmol/L 34 (H)   Glucose     70 - 99 mg/dL 664 (H)   BUN     8 - 23 mg/dL 21   Creatinine     4.03 - 1.00 mg/dL 4.74 (H)   Calcium     8.9 - 10.3 mg/dL 25.9 (H)   Total Protein     6.5 - 8.1 g/dL 5.9 (L)   Albumin     3.5 - 5.0 g/dL 3.6   AST     15 - 41 U/L 26   ALT     0 - 44 U/L 46 (H)   Alkaline Phosphatase     38 - 126 U/L 100   Total  Bilirubin     <1.2 mg/dL 0.5   GFR, Est Non African American     >60 mL/min 52 (L)   Anion gap     5 - 15  4 (L)   Magnesium     1.7 - 2.4 mg/dL     Legend: (L) Low (H) High RADIOGRAPHIC STUDIES: I have personally reviewed the radiological images as listed and agreed with the findings in the report. CT CHEST ABDOMEN PELVIS W CONTRAST  Result Date: 02/28/2023 CLINICAL DATA:  History of CLL, follow-up treatment response. * Tracking Code: BO * EXAM: CT CHEST, ABDOMEN, AND PELVIS WITH CONTRAST TECHNIQUE: Multidetector CT imaging of the chest, abdomen and pelvis was performed following the standard protocol during bolus administration of intravenous contrast. RADIATION DOSE REDUCTION: This exam was performed according to the departmental dose-optimization program which includes automated exposure control, adjustment of the mA and/or kV according to patient size and/or use of iterative reconstruction technique. CONTRAST:  OMNIPAQUE IOHEXOL 300 MG/ML  SOLN COMPARISON:  Multiple priors including CT November 13, 2019. FINDINGS: CT CHEST FINDINGS Cardiovascular: Aortic atherosclerosis. No central pulmonary embolus on this nondedicated study. Normal size heart. Coronary artery calcifications. No significant pericardial effusion/thickening. Mediastinum/Nodes: No suspicious thyroid nodule. No pathologically enlarged  mediastinal, hilar or axillary lymph nodes. Prominent mediastinal lymph nodes for instance a precarinal lymph node measuring 8 mm in short axis on image 26/2 previously this measured 17 mm in short axis. The esophagus is grossly unremarkable. Lungs/Pleura: Bilateral pleural thickening with trace pleural fluid on the right. Biapical pleuroparenchymal scarring. No suspicious pulmonary nodules or masses. Musculoskeletal: No aggressive lytic or blastic lesion of bone. Multilevel degenerative change. Degenerative change of the bilateral shoulders. CT ABDOMEN PELVIS FINDINGS Hepatobiliary: No suspicious  hepatic lesion. Gallbladder is unremarkable. No biliary ductal dilation. Pancreas: No pancreatic ductal dilation or evidence of acute inflammation. Spleen: No splenomegaly. Adrenals/Urinary Tract: Bilateral adrenal glands are within normal limits. Bilateral fluid density renal lesions are compatible with cysts and considered benign requiring no independent imaging follow-up. No solid enhancing renal mass identified. Urinary bladder is unremarkable for degree of distension. Stomach/Bowel: No radiopaque enteric contrast material was administered. Stomach is minimally distended limiting evaluation. No pathologic dilation of small or large bowel. Moderate volume of formed stool in the colon. No evidence of acute bowel inflammation. Vascular/Lymphatic: Aortic atherosclerosis. The portal, splenic and superior mesenteric veins are patent. Enlarged retroperitoneal lymph nodes are significantly decreased in size in comparison to the most recent examination available. For reference: -left periaortic lymph node measures 2.7 x 1.4 cm on image 75/2 previously this was part of a large lymph node conglomerate measuring 7.1 x 3.9 cm. Reproductive: Status post hysterectomy. No adnexal masses. Other: No significant abdominopelvic free fluid. Musculoskeletal: No aggressive lytic or blastic lesion of bone. Multilevel degenerative changes spine. Degenerative change of the bilateral hips, SI joints and pubic symphysis. Right hemi transitional lumbosacral anatomy. IMPRESSION: 1. Prominent mediastinal and enlarged retroperitoneal lymph nodes are significantly decreased in size from remote prior imaging. 2. No splenomegaly. 3. Bilateral pleural thickening with trace pleural fluid on the right, nonspecific. 4. Moderate volume of formed stool in the colon. 5.  Aortic Atherosclerosis (ICD10-I70.0). Electronically Signed   By: Maudry Mayhew M.D.   On: 02/28/2023 14:13    ASSESSMENT & PLAN:   77 year old woman with:   1.  CLL presented  with lymphocytosis and adenopathy diagnosed in 2019. 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  Rheumatoid arthritis: She remains without any recent exacerbation.  3.  Bullous lesions on her upper and lower extremities: Possibly related to skin reaction associated with Irbutinib or the patient autoimmune disorder (RA)  PLAN: -Discussed lab results from today, 03/12/2023, in detail with the patient. CBC is stable. CMP shows elevated creatinine of 1.10, elevated calcium level of 12.6, decreased total protein level of 5.9, and slightly elevated ALT of 46.  -Discussed CT chest scan results from 02/28/2023 in detail. Showed Prominent mediastinal and enlarged retroperitoneal lymph nodes are significantly decreased in size from remote prior imaging. No splenomegaly. Bilateral pleural thickening with trace pleural fluid on the right, nonspecific. Moderate volume of formed stool in the colon. -Discussed the causes of high calcium levels which includes dehydration, parathyroid issues, and lasix.  -Recommend to go to the ED regarding high calcium levels. Possible need bone scan and other lab workup. Patient agrees.  -Patient will be transferred to the ED.   -Answered all of patient's questions regarding shingles, elevated calcium levels, and CLL. -Continue to follow-up with Dermatologist and PCP. -will continue to hold 420 mg Ibrutinib at this time as CLL has been stable.   FOLLOW-UP: Referred to ED for evaluation of significant new hypercalcemia  The total time spent in the appointment was 23 minutes* .  All of the patient's questions were answered with apparent satisfaction. The patient knows to call the clinic with any problems, questions or concerns.   Wyvonnia Lora MD MS AAHIVMS [facility] Hematology/Oncology Physician [facility]  .*Total Encounter Time as defined by the Centers for Medicare and Medicaid Services includes, in addition to the face-to-face time of a patient visit  (documented in the note above) non-face-to-face time: obtaining and reviewing outside history, ordering and reviewing medications, tests or procedures, care coordination (communications with other health care professionals or caregivers) and documentation in the medical record.   I,Param Shah,acting as a Neurosurgeon for Wyvonnia Lora, MD.,have documented all relevant documentation on the behalf of Wyvonnia Lora, MD,as directed by  Wyvonnia Lora, MD while in the presence of Wyvonnia Lora, MD.   .I have reviewed the above documentation for accuracy and completeness, and I agree with the above. Johney Maine MD

## 2023-03-12 NOTE — ED Notes (Signed)
I called patient to recheck vitals and no one responded 

## 2023-03-13 ENCOUNTER — Encounter: Payer: Self-pay | Admitting: Hematology

## 2023-03-13 LAB — PTH, INTACT AND CALCIUM
Calcium, Total (PTH): 12.4 mg/dL — ABNORMAL HIGH (ref 8.7–10.3)
PTH: 5 pg/mL — ABNORMAL LOW (ref 15–65)

## 2023-03-16 ENCOUNTER — Encounter (HOSPITAL_BASED_OUTPATIENT_CLINIC_OR_DEPARTMENT_OTHER): Payer: Self-pay | Admitting: Emergency Medicine

## 2023-03-16 ENCOUNTER — Other Ambulatory Visit: Payer: Self-pay

## 2023-03-16 ENCOUNTER — Inpatient Hospital Stay (HOSPITAL_COMMUNITY): Payer: PPO

## 2023-03-16 ENCOUNTER — Emergency Department (HOSPITAL_BASED_OUTPATIENT_CLINIC_OR_DEPARTMENT_OTHER): Payer: PPO

## 2023-03-16 ENCOUNTER — Inpatient Hospital Stay (HOSPITAL_BASED_OUTPATIENT_CLINIC_OR_DEPARTMENT_OTHER)
Admission: EM | Admit: 2023-03-16 | Discharge: 2023-03-18 | DRG: 641 | Disposition: A | Discharge status: Home / Self Care | Payer: PPO | Attending: Internal Medicine | Admitting: Internal Medicine

## 2023-03-16 DIAGNOSIS — M069 Rheumatoid arthritis, unspecified: Secondary | ICD-10-CM | POA: Diagnosis not present

## 2023-03-16 DIAGNOSIS — M47812 Spondylosis without myelopathy or radiculopathy, cervical region: Secondary | ICD-10-CM | POA: Diagnosis not present

## 2023-03-16 DIAGNOSIS — I10 Essential (primary) hypertension: Secondary | ICD-10-CM | POA: Diagnosis not present

## 2023-03-16 DIAGNOSIS — K5900 Constipation, unspecified: Secondary | ICD-10-CM | POA: Diagnosis not present

## 2023-03-16 DIAGNOSIS — B029 Zoster without complications: Secondary | ICD-10-CM | POA: Diagnosis present

## 2023-03-16 DIAGNOSIS — C9110 Chronic lymphocytic leukemia of B-cell type not having achieved remission: Secondary | ICD-10-CM | POA: Diagnosis not present

## 2023-03-16 DIAGNOSIS — Z888 Allergy status to other drugs, medicaments and biological substances status: Secondary | ICD-10-CM | POA: Diagnosis not present

## 2023-03-16 DIAGNOSIS — Z79890 Hormone replacement therapy (postmenopausal): Secondary | ICD-10-CM | POA: Diagnosis not present

## 2023-03-16 DIAGNOSIS — E876 Hypokalemia: Secondary | ICD-10-CM | POA: Diagnosis not present

## 2023-03-16 DIAGNOSIS — Z881 Allergy status to other antibiotic agents status: Secondary | ICD-10-CM

## 2023-03-16 DIAGNOSIS — Z79899 Other long term (current) drug therapy: Secondary | ICD-10-CM

## 2023-03-16 DIAGNOSIS — Z90710 Acquired absence of both cervix and uterus: Secondary | ICD-10-CM | POA: Diagnosis not present

## 2023-03-16 DIAGNOSIS — I878 Other specified disorders of veins: Secondary | ICD-10-CM | POA: Diagnosis not present

## 2023-03-16 DIAGNOSIS — R110 Nausea: Secondary | ICD-10-CM | POA: Diagnosis not present

## 2023-03-16 DIAGNOSIS — E782 Mixed hyperlipidemia: Secondary | ICD-10-CM | POA: Diagnosis not present

## 2023-03-16 DIAGNOSIS — R079 Chest pain, unspecified: Secondary | ICD-10-CM | POA: Diagnosis not present

## 2023-03-16 DIAGNOSIS — K589 Irritable bowel syndrome without diarrhea: Secondary | ICD-10-CM | POA: Insufficient documentation

## 2023-03-16 DIAGNOSIS — R5383 Other fatigue: Secondary | ICD-10-CM | POA: Diagnosis not present

## 2023-03-16 DIAGNOSIS — I341 Nonrheumatic mitral (valve) prolapse: Secondary | ICD-10-CM | POA: Insufficient documentation

## 2023-03-16 DIAGNOSIS — C9111 Chronic lymphocytic leukemia of B-cell type in remission: Secondary | ICD-10-CM | POA: Diagnosis not present

## 2023-03-16 DIAGNOSIS — E039 Hypothyroidism, unspecified: Secondary | ICD-10-CM | POA: Diagnosis present

## 2023-03-16 DIAGNOSIS — Z8249 Family history of ischemic heart disease and other diseases of the circulatory system: Secondary | ICD-10-CM | POA: Diagnosis not present

## 2023-03-16 LAB — COMPREHENSIVE METABOLIC PANEL
ALT: 47 U/L — ABNORMAL HIGH (ref 0–44)
AST: 37 U/L (ref 15–41)
Albumin: 3.8 g/dL (ref 3.5–5.0)
Alkaline Phosphatase: 107 U/L (ref 38–126)
Anion gap: 8 (ref 5–15)
BUN: 17 mg/dL (ref 8–23)
CO2: 32 mmol/L (ref 22–32)
Calcium: 14.5 mg/dL (ref 8.9–10.3)
Chloride: 96 mmol/L — ABNORMAL LOW (ref 98–111)
Creatinine, Ser: 1.1 mg/dL — ABNORMAL HIGH (ref 0.44–1.00)
GFR, Estimated: 52 mL/min — ABNORMAL LOW (ref >60)
Glucose, Bld: 121 mg/dL — ABNORMAL HIGH (ref 70–99)
Potassium: 3.8 mmol/L (ref 3.5–5.1)
Sodium: 136 mmol/L (ref 135–145)
Total Bilirubin: 0.6 mg/dL (ref <1.2)
Total Protein: 6.6 g/dL (ref 6.5–8.1)

## 2023-03-16 LAB — URINALYSIS, MICROSCOPIC (REFLEX)

## 2023-03-16 LAB — CBC WITH DIFFERENTIAL/PLATELET
Abs Immature Granulocytes: 0.14 10*3/uL — ABNORMAL HIGH (ref 0.00–0.07)
Basophils Absolute: 0.1 10*3/uL (ref 0.0–0.1)
Basophils Relative: 1 %
Eosinophils Absolute: 0.5 10*3/uL (ref 0.0–0.5)
Eosinophils Relative: 7 %
HCT: 42 % (ref 36.0–46.0)
Hemoglobin: 14 g/dL (ref 12.0–15.0)
Immature Granulocytes: 2 %
Lymphocytes Relative: 44 %
Lymphs Abs: 2.8 10*3/uL (ref 0.7–4.0)
MCH: 28.9 pg (ref 26.0–34.0)
MCHC: 33.3 g/dL (ref 30.0–36.0)
MCV: 86.6 fL (ref 80.0–100.0)
Monocytes Absolute: 0.2 10*3/uL (ref 0.1–1.0)
Monocytes Relative: 3 %
Neutro Abs: 2.8 10*3/uL (ref 1.7–7.7)
Neutrophils Relative %: 43 %
Platelets: 256 10*3/uL (ref 150–400)
RBC: 4.85 MIL/uL (ref 3.87–5.11)
RDW: 13 % (ref 11.5–15.5)
WBC: 6.5 10*3/uL (ref 4.0–10.5)
nRBC: 0 % (ref 0.0–0.2)

## 2023-03-16 LAB — BASIC METABOLIC PANEL
Anion gap: 10 (ref 5–15)
BUN: 17 mg/dL (ref 8–23)
CO2: 28 mmol/L (ref 22–32)
Calcium: 14.4 mg/dL (ref 8.9–10.3)
Chloride: 100 mmol/L (ref 98–111)
Creatinine, Ser: 0.88 mg/dL (ref 0.44–1.00)
GFR, Estimated: >60 mL/min (ref >60)
Glucose, Bld: 174 mg/dL — ABNORMAL HIGH (ref 70–99)
Potassium: 3.7 mmol/L (ref 3.5–5.1)
Sodium: 138 mmol/L (ref 135–145)

## 2023-03-16 LAB — TROPONIN I (HIGH SENSITIVITY)
Troponin I (High Sensitivity): 6 ng/L (ref <18)
Troponin I (High Sensitivity): 9 ng/L (ref <18)

## 2023-03-16 LAB — URINALYSIS, ROUTINE W REFLEX MICROSCOPIC
Bilirubin Urine: NEGATIVE
Glucose, UA: NEGATIVE mg/dL
Ketones, ur: NEGATIVE mg/dL
Leukocytes,Ua: NEGATIVE
Nitrite: NEGATIVE
Protein, ur: NEGATIVE mg/dL
Specific Gravity, Urine: 1.015 (ref 1.005–1.030)
pH: 7 (ref 5.0–8.0)

## 2023-03-16 LAB — PHOSPHORUS: Phosphorus: 4.2 mg/dL (ref 2.5–4.6)

## 2023-03-16 LAB — LACTATE DEHYDROGENASE: LDH: 195 U/L — ABNORMAL HIGH (ref 98–192)

## 2023-03-16 LAB — OCCULT BLOOD X 1 CARD TO LAB, STOOL: Fecal Occult Bld: NEGATIVE

## 2023-03-16 LAB — MAGNESIUM: Magnesium: 1.9 mg/dL (ref 1.7–2.4)

## 2023-03-16 LAB — VITAMIN D 25 HYDROXY (VIT D DEFICIENCY, FRACTURES): Vit D, 25-Hydroxy: 36.26 ng/mL (ref 30–100)

## 2023-03-16 MED ORDER — ENOXAPARIN SODIUM 40 MG/0.4ML IJ SOSY
40.0000 mg | PREFILLED_SYRINGE | INTRAMUSCULAR | Status: DC
  Administered 2023-03-16 – 2023-03-17 (×2): 40 mg via SUBCUTANEOUS
  Filled 2023-03-16 (×2): qty 0.4

## 2023-03-16 MED ORDER — PREDNISONE 50 MG PO TABS
60.0000 mg | ORAL_TABLET | Freq: Once | ORAL | Status: AC
  Administered 2023-03-16: 60 mg via ORAL
  Filled 2023-03-16: qty 1

## 2023-03-16 MED ORDER — SODIUM CHLORIDE 0.9 % IV BOLUS
1000.0000 mL | Freq: Once | INTRAVENOUS | Status: AC
  Administered 2023-03-16: 1000 mL via INTRAVENOUS

## 2023-03-16 MED ORDER — VALACYCLOVIR HCL 500 MG PO TABS
1000.0000 mg | ORAL_TABLET | Freq: Three times a day (TID) | ORAL | Status: DC
  Administered 2023-03-16 – 2023-03-18 (×6): 1000 mg via ORAL
  Filled 2023-03-16 (×6): qty 2

## 2023-03-16 MED ORDER — PREDNISONE 50 MG PO TABS
60.0000 mg | ORAL_TABLET | Freq: Every day | ORAL | Status: DC
Start: 2023-03-17 — End: 2023-03-18
  Administered 2023-03-17 – 2023-03-18 (×2): 60 mg via ORAL
  Filled 2023-03-16 (×2): qty 1

## 2023-03-16 MED ORDER — KCL IN DEXTROSE-NACL 20-5-0.9 MEQ/L-%-% IV SOLN
INTRAVENOUS | Status: AC
  Filled 2023-03-16: qty 1000

## 2023-03-16 MED ORDER — ORAL CARE MOUTH RINSE: 15.0000 mL | OROMUCOSAL | Status: DC | PRN

## 2023-03-16 MED ORDER — PREDNISONE 20 MG PO TABS: 20.0000 mg | ORAL_TABLET | Freq: Every day | ORAL | Status: DC

## 2023-03-16 MED ORDER — ACETAMINOPHEN 325 MG PO TABS: 650.0000 mg | ORAL_TABLET | Freq: Four times a day (QID) | ORAL | Status: DC | PRN

## 2023-03-16 MED ORDER — ONDANSETRON HCL 4 MG PO TABS: 4.0000 mg | ORAL_TABLET | Freq: Four times a day (QID) | ORAL | Status: DC | PRN

## 2023-03-16 MED ORDER — ONDANSETRON HCL 4 MG/2ML IJ SOLN: 4.0000 mg | Freq: Four times a day (QID) | INTRAMUSCULAR | Status: DC | PRN

## 2023-03-16 MED ORDER — FUROSEMIDE 10 MG/ML IJ SOLN
20.0000 mg | Freq: Once | INTRAMUSCULAR | Status: AC
  Administered 2023-03-16: 20 mg via INTRAVENOUS
  Filled 2023-03-16: qty 2

## 2023-03-16 MED ORDER — ACETAMINOPHEN 325 MG PO TABS
650.0000 mg | ORAL_TABLET | Freq: Once | ORAL | Status: AC
  Administered 2023-03-16: 650 mg via ORAL
  Filled 2023-03-16: qty 2

## 2023-03-16 MED ORDER — ZOLEDRONIC ACID 4 MG/100ML IV SOLN
4.0000 mg | Freq: Once | INTRAVENOUS | Status: AC
  Administered 2023-03-16: 4 mg via INTRAVENOUS
  Filled 2023-03-16: qty 100

## 2023-03-16 MED ORDER — ACETAMINOPHEN 650 MG RE SUPP: 650.0000 mg | Freq: Four times a day (QID) | RECTAL | Status: DC | PRN

## 2023-03-16 MED ORDER — MAGNESIUM HYDROXIDE 400 MG/5ML PO SUSP
30.0000 mL | Freq: Every day | ORAL | Status: DC | PRN
  Administered 2023-03-16: 30 mL via ORAL
  Filled 2023-03-16: qty 30

## 2023-03-16 MED ORDER — ENSURE ENLIVE PO LIQD
237.0000 mL | Freq: Two times a day (BID) | ORAL | Status: DC
  Administered 2023-03-17 – 2023-03-18 (×3): 237 mL via ORAL

## 2023-03-16 MED ORDER — MAGNESIUM HYDROXIDE NICU ORAL SYRINGE 400 MG/5 ML: 30.0000 mL | Freq: Every day | ORAL | Status: DC | PRN

## 2023-03-16 MED ORDER — LEVOTHYROXINE SODIUM 25 MCG PO TABS
25.0000 ug | ORAL_TABLET | Freq: Every day | ORAL | Status: DC
  Administered 2023-03-17 – 2023-03-18 (×2): 25 ug via ORAL
  Filled 2023-03-16 (×2): qty 1

## 2023-03-16 MED ORDER — MAGNESIUM HYDROXIDE 400 MG/5ML PO SUSP
30.0000 mL | Freq: Once | ORAL | Status: AC
  Administered 2023-03-16: 30 mL via ORAL

## 2023-03-16 MED ORDER — ESCITALOPRAM OXALATE 10 MG PO TABS
5.0000 mg | ORAL_TABLET | Freq: Every day | ORAL | Status: DC
Start: 2023-03-16 — End: 2023-03-18
  Administered 2023-03-17 – 2023-03-18 (×2): 5 mg via ORAL
  Filled 2023-03-16 (×3): qty 1

## 2023-03-16 NOTE — ED Notes (Signed)
Patient now endorses constipation.

## 2023-03-16 NOTE — Progress Notes (Signed)
HEMATOLOGY/ONCOLOGY INPATIENT PROGRESS NOTE  Date of Service: 03/16/2023  Inpatient Attending: .Bobette Mo, MD   SUBJECTIVE  Nicole Campbell is a 77 y.o. female who is an established patient followed for management of CLL.  I referred her to the ED on 03/12/2023 after findings of elevated calcium at 12.4. Patient left the emergency department without the completion of her care.  Patient has been readmitted with increasing fatigue nausea worsening hypercalcemia with a calcium of 14.4. She was seen with her husband at bedside.  Notes no new lumps or bumps.  No new skin rashes.  No new bone pains. She has been receiving IV fluids will be receiving Zometa Feels like she is mentally little cloudy but no overt altered mental status and is appropriate and oriented.  OBJECTIVE:  NAD  PHYSICAL EXAMINATION: . Vitals:   03/16/23 1200 03/16/23 1228 03/16/23 1328 03/16/23 1330  BP: (!) 172/84 (!) 170/80  (!) 169/83  Pulse: 85 85  (!) 102  Resp: 17 16  18   Temp: 98.1 F (36.7 C) 98.3 F (36.8 C)  97.6 F (36.4 C)  TempSrc:  Oral  Oral  SpO2: 96% 96%  99%  Weight:   72.5 kg   Height:   5\' 6"  (1.676 m)    Filed Weights   03/16/23 0404 03/16/23 1328  Weight: 73.5 kg 72.5 kg   .Body mass index is 25.8 kg/m.  GENERAL:alert, in no acute distress and comfortable SKIN: skin color, texture, turgor are normal, no rashes or significant lesions EYES: normal, conjunctiva are pink and non-injected, sclera clear OROPHARYNX:no exudate, no erythema and lips, buccal mucosa, and tongue normal  NECK: supple, no JVD, thyroid normal size, non-tender, without nodularity LYMPH:  no palpable lymphadenopathy in the cervical, axillary or inguinal LUNGS: clear to auscultation with normal respiratory effort HEART: regular rate & rhythm,  no murmurs and no lower extremity edema ABDOMEN: abdomen soft, non-tender, normoactive bowel sounds  Musculoskeletal: no cyanosis of digits and no  clubbing  PSYCH: alert & oriented x 3 with fluent speech NEURO: no focal motor/sensory deficits  MEDICAL HISTORY:  Past Medical History:  Diagnosis Date   CLL (chronic lymphocytic leukemia) (HCC)    Hypertension    Hypothyroid    MVP (mitral valve prolapse)    Rheumatoid arthritis (HCC)     SURGICAL HISTORY: Past Surgical History:  Procedure Laterality Date   ABDOMINAL HYSTERECTOMY     IR THORACENTESIS ASP PLEURAL SPACE W/IMG GUIDE  11/21/2019   IR THORACENTESIS ASP PLEURAL SPACE W/IMG GUIDE  07/05/2020   TONSILLECTOMY      SOCIAL HISTORY: Social History   Socioeconomic History   Marital status: Married    Spouse name: Not on file   Number of children: 3   Years of education: Not on file   Highest education level: Not on file  Occupational History   Not on file  Tobacco Use   Smoking status: Never   Smokeless tobacco: Never  Vaping Use   Vaping status: Never Used  Substance and Sexual Activity   Alcohol use: Yes    Comment: Rare   Drug use: Never   Sexual activity: Not on file  Other Topics Concern   Not on file  Social History Narrative   Not on file   Social Determinants of Health   Financial Resource Strain: Not on file  Food Insecurity: No Food Insecurity (03/16/2023)   Hunger Vital Sign    Worried About Running Out of Food in the  Last Year: Never true    Ran Out of Food in the Last Year: Never true  Transportation Needs: No Transportation Needs (03/16/2023)   PRAPARE - Administrator, Civil Service (Medical): No    Lack of Transportation (Non-Medical): No  Physical Activity: Not on file  Stress: Not on file  Social Connections: Not on file  Intimate Partner Violence: Not At Risk (03/16/2023)   Humiliation, Afraid, Rape, and Kick questionnaire    Fear of Current or Ex-Partner: No    Emotionally Abused: No    Physically Abused: No    Sexually Abused: No    FAMILY HISTORY: Family History  Problem Relation Age of Onset   Congestive  Heart Failure Father     ALLERGIES:  is allergic to methotrexate and erythromycin.  MEDICATIONS:  Scheduled Meds:  enoxaparin (LOVENOX) injection  40 mg Subcutaneous Q24H   escitalopram  5 mg Oral Daily   [START ON 03/17/2023] levothyroxine  25 mcg Oral QAC breakfast   magnesium hydroxide  30 mL Oral Once   valACYclovir  1,000 mg Oral TID   zoledronic acid (ZOMETA) IVPB Oncology  4 mg Intravenous Once   Continuous Infusions:  dextrose 5 % and 0.9 % NaCl with KCl 20 mEq/L     PRN Meds:.acetaminophen **OR** acetaminophen, magnesium hydroxide, ondansetron **OR** ondansetron (ZOFRAN) IV  REVIEW OF SYSTEMS:    10 Point review of Systems was done is negative except as noted above.   LABORATORY DATA:  I have reviewed the data as listed  .    Latest Ref Rng & Units 03/16/2023    4:35 AM 03/12/2023   12:13 PM 12/20/2022   10:18 AM  CBC  WBC 4.0 - 10.5 K/uL 6.5  5.3  5.4   Hemoglobin 12.0 - 15.0 g/dL 16.1  09.6  04.5   Hematocrit 36.0 - 46.0 % 42.0  38.6  39.9   Platelets 150 - 400 K/uL 256  196  192     .    Latest Ref Rng & Units 03/16/2023    1:54 PM 03/16/2023    4:35 AM 03/12/2023    3:55 PM  CMP  Glucose 70 - 99 mg/dL 409  811    BUN 8 - 23 mg/dL 17  17    Creatinine 9.14 - 1.00 mg/dL 7.82  9.56    Sodium 213 - 145 mmol/L 138  136    Potassium 3.5 - 5.1 mmol/L 3.7  3.8    Chloride 98 - 111 mmol/L 100  96    CO2 22 - 32 mmol/L 28  32    Calcium 8.9 - 10.3 mg/dL 08.6  57.8  46.9   Total Protein 6.5 - 8.1 g/dL  6.6    Total Bilirubin <1.2 mg/dL  0.6    Alkaline Phos 38 - 126 U/L  107    AST 15 - 41 U/L  37    ALT 0 - 44 U/L  47       RADIOGRAPHIC STUDIES: I have personally reviewed the radiological images as listed and agreed with the findings in the report. DG Abd 2 Views  Result Date: 03/16/2023 CLINICAL DATA:  Constipation EXAM: ABDOMEN - 2 VIEW COMPARISON:  CT of the abdomen 02/28/2023 FINDINGS: Formed stool throughout much of the colon but no rectal  impaction or signs of obstruction. No over distension of the colon. Phleboliths are present. No concerning mass effect or gas collection. IMPRESSION: Moderate stool without obstructive pattern or rectal impaction.  Electronically Signed   By: Tiburcio Pea M.D.   On: 03/16/2023 07:39   DG Chest 2 View  Result Date: 03/16/2023 CLINICAL DATA:  Chest pain.  Shingles for a week EXAM: CHEST - 2 VIEW COMPARISON:  06/28/2021 FINDINGS: Chronic hyperinflation and costophrenic sulcus blunting from scarring by CT. There is no edema, consolidation, effusion, or pneumothorax. Normal heart size and aortic contours. IMPRESSION: No acute finding when compared to prior. Electronically Signed   By: Tiburcio Pea M.D.   On: 03/16/2023 07:38   CT CHEST ABDOMEN PELVIS W CONTRAST  Result Date: 02/28/2023 CLINICAL DATA:  History of CLL, follow-up treatment response. * Tracking Code: BO * EXAM: CT CHEST, ABDOMEN, AND PELVIS WITH CONTRAST TECHNIQUE: Multidetector CT imaging of the chest, abdomen and pelvis was performed following the standard protocol during bolus administration of intravenous contrast. RADIATION DOSE REDUCTION: This exam was performed according to the departmental dose-optimization program which includes automated exposure control, adjustment of the mA and/or kV according to patient size and/or use of iterative reconstruction technique. CONTRAST:  OMNIPAQUE IOHEXOL 300 MG/ML  SOLN COMPARISON:  Multiple priors including CT November 13, 2019. FINDINGS: CT CHEST FINDINGS Cardiovascular: Aortic atherosclerosis. No central pulmonary embolus on this nondedicated study. Normal size heart. Coronary artery calcifications. No significant pericardial effusion/thickening. Mediastinum/Nodes: No suspicious thyroid nodule. No pathologically enlarged mediastinal, hilar or axillary lymph nodes. Prominent mediastinal lymph nodes for instance a precarinal lymph node measuring 8 mm in short axis on image 26/2 previously this  measured 17 mm in short axis. The esophagus is grossly unremarkable. Lungs/Pleura: Bilateral pleural thickening with trace pleural fluid on the right. Biapical pleuroparenchymal scarring. No suspicious pulmonary nodules or masses. Musculoskeletal: No aggressive lytic or blastic lesion of bone. Multilevel degenerative change. Degenerative change of the bilateral shoulders. CT ABDOMEN PELVIS FINDINGS Hepatobiliary: No suspicious hepatic lesion. Gallbladder is unremarkable. No biliary ductal dilation. Pancreas: No pancreatic ductal dilation or evidence of acute inflammation. Spleen: No splenomegaly. Adrenals/Urinary Tract: Bilateral adrenal glands are within normal limits. Bilateral fluid density renal lesions are compatible with cysts and considered benign requiring no independent imaging follow-up. No solid enhancing renal mass identified. Urinary bladder is unremarkable for degree of distension. Stomach/Bowel: No radiopaque enteric contrast material was administered. Stomach is minimally distended limiting evaluation. No pathologic dilation of small or large bowel. Moderate volume of formed stool in the colon. No evidence of acute bowel inflammation. Vascular/Lymphatic: Aortic atherosclerosis. The portal, splenic and superior mesenteric veins are patent. Enlarged retroperitoneal lymph nodes are significantly decreased in size in comparison to the most recent examination available. For reference: -left periaortic lymph node measures 2.7 x 1.4 cm on image 75/2 previously this was part of a large lymph node conglomerate measuring 7.1 x 3.9 cm. Reproductive: Status post hysterectomy. No adnexal masses. Other: No significant abdominopelvic free fluid. Musculoskeletal: No aggressive lytic or blastic lesion of bone. Multilevel degenerative changes spine. Degenerative change of the bilateral hips, SI joints and pubic symphysis. Right hemi transitional lumbosacral anatomy. IMPRESSION: 1. Prominent mediastinal and enlarged  retroperitoneal lymph nodes are significantly decreased in size from remote prior imaging. 2. No splenomegaly. 3. Bilateral pleural thickening with trace pleural fluid on the right, nonspecific. 4. Moderate volume of formed stool in the colon. 5.  Aortic Atherosclerosis (ICD10-I70.0). Electronically Signed   By: Maudry Mayhew M.D.   On: 02/28/2023 14:13    ASSESSMENT & PLAN:  77 y.o. female with:  1.  CLL presented with lymphocytosis and adenopathy diagnosed in 2019. 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  Rheumatoid arthritis: She remains without any recent exacerbation.   3.  Bullous lesions on her upper and lower extremities: Possibly related to skin reaction associated with Ibrutinib or the patient autoimmune disorder (RA)  4. Non PTH hypercalcemia PLAN:  -Discussed lab results on 03/16/2023 in detail with patient. CBC showed WBC of 6.5K, hemoglobin of 14.0, and platelets of 256K. -her calcium level increased from 12.4 on 03/12/2023 to 14.4 currently.  -no PTH hypercalcemia workup ordered -Bone met survey -IV fluids per hospitalist -Agree with Zometa IV to help with hypercalcemia -Prednisone 40 mg daily for at least 5 days -We discussed that though she does not have any overt evidence of CLL progression based on her recent scans and blood tests that there could be some element of rebound of the ibrutinib.  Also there is possibility that her underlying autoimmune condition can flare off the ibrutinib. -Now that we know that her bullous lesions have resolved and are likely related to her autoimmune condition and not the ibrutinib we discussed that once her metabolic parameters improve that she can restart her Ibrutinib. - appreciate excellent hospital medicine cares -  The total time spent in the appointment was 50 minutes* .  All of the patient's questions were answered with apparent satisfaction. The patient knows to call the clinic with any problems, questions or  concerns.   Wyvonnia Lora MD MS AAHIVMS [facility] Hematology/Oncology Physician [facility]  .*Total Encounter Time as defined by the Centers for Medicare and Medicaid Services includes, in addition to the face-to-face time of a patient visit (documented in the note above) non-face-to-face time: obtaining and reviewing outside history, ordering and reviewing medications, tests or procedures, care coordination (communications with other health care professionals or caregivers) and documentation in the medical record.    I,Mitra Faeizi,acting as a Neurosurgeon for No name on file.,have documented all relevant documentation on the behalf of No name on file,as directed by  No name on file while in the presence of No name on file.  .I have reviewed the above documentation for accuracy and completeness, and I agree with the above. Wyvonnia Lora MD MS

## 2023-03-16 NOTE — Progress Notes (Signed)
ONoclogy short note  K/h/o CLL admitted with worsening/new hypercalcemia. CT CAP recently with improved CLL. Has been off Imbruvica due to skin bullous lesions now proven by skin bx to be not related to Imbruvica and have resolved. Plan IVF per hospital medicine -prednisone 60mg  po daily x 5 -Non parathyroid hypercalcemia workup ordered -receiving Zometa IV today -will restart Imbruvica on discharge if no other etiology of hypercalcemia apparent. -oncology will follow.  Wyvonnia Lora MD Kathie Rhodes

## 2023-03-16 NOTE — ED Notes (Signed)
Will be after 7a ( shift change when a truck will be en route to get the patient).

## 2023-03-16 NOTE — ED Provider Notes (Addendum)
[facility] Provider Note   CSN: [id number] Arrival date & time: 03/16/23  0346     History  Chief Complaint  Patient presents with   Back Pain    Nicole Campbell is a 77 y.o. female.  Patient with a history of CLL in remission, hypertension, rheumatoid arthritis presenting with mid back pain and rash associated with shingles.  States she is been having pain for about 2 weeks developed a shingles rash on November 6.  She has been on Valtrex 3 times daily and taking Tylenol for pain.  Comes in tonight because the pain in her mid back is more severe preventing her from sleeping.  She reports this is the same pain she has had since she had the shingles but is progressively worsening.  Taking acetaminophen but no other pain medications.  No cough, fever, chest pain other than where her rashes.  No leg pain or leg swelling.  No abdominal pain, vomiting or diarrhea.  She saw her oncologist last week and was sent to the ED on November 11 when her calcium level was over 12.  She left without completion of care.  States her aches and pains and flulike symptoms have been progressively worsening as well as pain to her mid back where her shingles rash is.  She wonders if her high calcium could be related.  Not currently receiving any chemotherapy or radiation  No weakness or numbness in her legs.  No bowel or bladder incontinence.  No fever or vomiting.  No history of IV drug abuse.  No history of chronic back problems.  No previous back surgeries.  She recently had a staging CT of the end of October of her chest abdomen pelvis that showed lymphadenopathy.  She also reports not being he will have a bowel movement for the past 1 week despite using laxatives at home.  Normally moves every other day.  Feels like something is there but she cannot go.  No vomiting.  The history is provided by the patient and the spouse.  Back Pain Associated symptoms:  chest pain, headaches and weakness   Associated symptoms: no abdominal pain, no dysuria and no fever        Home Medications Prior to Admission medications   Medication Sig Start Date End Date Taking? Authorizing Provider  acetaminophen (TYLENOL) 650 MG CR tablet Take 1,300 mg by mouth every 8 (eight) hours as needed for pain.    [provider]  escitalopram (LEXAPRO) 5 MG tablet Take 5 mg by mouth daily. 12/15/20   [provider]  feeding supplement (BOOST HIGH PROTEIN) LIQD Take 1 Container by mouth daily with breakfast.    [provider]  FLUZONE HIGH-DOSE 0.5 ML injection Inject 0.5 mLs into the muscle once. 12/21/22   [provider]  furosemide (LASIX) 20 MG tablet Take 1 tablet (20 mg total) by mouth daily as needed. 06/20/21 07/20/21  Darlin Drop, DO  ibrutinib (IMBRUVICA) 420 MG tablet TAKE 1 TABLET (420 MG) BY MOUTH DAILY. 09/06/22 09/06/23  Johney Maine, MD  levothyroxine (SYNTHROID, LEVOTHROID) 25 MCG tablet Take 25 mcg by mouth daily before breakfast.  04/30/17   [provider]  Multiple Vitamin (MULTIVITAMIN ADULT PO) Take 1 tablet by mouth daily.    [provider]  potassium chloride (KLOR-CON M) 10 MEQ tablet Take 1 tablet (10 mEq total) by mouth daily as needed. Pease take along with lasix if needed for  legs swelling. 06/20/21 07/20/21  Darlin Drop, DO  spironolactone (ALDACTONE) 25 MG tablet Take 1 tablet (25 mg total) by mouth daily. 10/21/21   Nyoka Cowden, MD  valACYclovir (VALTREX) 500 MG tablet Take 1,000 mg by mouth 3 (three) times daily. 03/07/23   [provider]      Allergies    Methotrexate and Erythromycin    Review of Systems   Review of Systems  Constitutional:  Positive for activity change, appetite change and fatigue. Negative for fever.  HENT:  Negative for congestion and rhinorrhea.   Respiratory:  Positive for chest tightness. Negative for shortness of breath.   Cardiovascular:   Positive for chest pain.  Gastrointestinal:  Negative for abdominal pain and vomiting.  Genitourinary:  Negative for dysuria and hematuria.  Musculoskeletal:  Positive for back pain.  Skin:  Positive for rash.  Neurological:  Positive for weakness and headaches. Negative for dizziness.   all other systems are negative except as noted in the HPI and PMH.    Physical Exam Updated Vital Signs BP (!) 176/81   Pulse 95   Temp 97.8 F (36.6 C) (Oral)   Resp 16   Ht 5' 6.5" (1.689 m)   Wt 73.5 kg   SpO2 100%   BMI 25.76 kg/m  Physical Exam Vitals and nursing note reviewed.  Constitutional:      General: She is not in acute distress.    Appearance: Normal appearance. She is well-developed and normal weight. She is not ill-appearing.  HENT:     Head: Normocephalic and atraumatic.     Mouth/Throat:     Pharynx: No oropharyngeal exudate.  Eyes:     Conjunctiva/sclera: Conjunctivae normal.     Pupils: Pupils are equal, round, and reactive to light.  Neck:     Comments: No meningismus. Cardiovascular:     Rate and Rhythm: Normal rate and regular rhythm.     Heart sounds: Normal heart sounds. No murmur heard. Pulmonary:     Effort: Pulmonary effort is normal. No respiratory distress.     Breath sounds: Normal breath sounds.  Chest:     Chest wall: Tenderness present.  Abdominal:     Palpations: Abdomen is soft.     Tenderness: There is no abdominal tenderness. There is no guarding or rebound.  Genitourinary:    Comments: Chaperone present Electrical engineer.  Small external hemorrhoid, no bleeding.  No palpable stool on fingertip. Musculoskeletal:        General: No tenderness. Normal range of motion.     Cervical back: Normal range of motion and neck supple.  Skin:    General: Skin is warm.     Findings: Erythema and rash present.     Comments: Healing vesicular rash to left mid back, chest area underneath breast with blistering. Chaperone present Engineer, building services.  Neurological:      Mental Status: She is alert and oriented to person, place, and time.     Cranial Nerves: No cranial nerve deficit.     Motor: No abnormal muscle tone.     Coordination: Coordination normal.     Comments:  5/5 strength throughout. CN 2-12 intact.Equal grip strength.   Psychiatric:        Behavior: Behavior normal.     ED Results / Procedures / Treatments   Labs (all labs ordered are listed, but only abnormal results are displayed) Labs Reviewed  CBC WITH DIFFERENTIAL/PLATELET - Abnormal; Notable for the following components:  Result Value   Abs Immature Granulocytes 0.14 (*)    All other components within normal limits  COMPREHENSIVE METABOLIC PANEL - Abnormal; Notable for the following components:   Chloride 96 (*)    Glucose, Bld 121 (*)    Creatinine, Ser 1.10 (*)    Calcium 14.5 (*)    ALT 47 (*)    GFR, Estimated 52 (*)    All other components within normal limits  URINALYSIS, ROUTINE W REFLEX MICROSCOPIC - Abnormal; Notable for the following components:   Hgb urine dipstick TRACE (*)    All other components within normal limits  URINALYSIS, MICROSCOPIC (REFLEX) - Abnormal; Notable for the following components:   Bacteria, UA RARE (*)    All other components within normal limits  OCCULT BLOOD X 1 CARD TO LAB, STOOL  TROPONIN I (HIGH SENSITIVITY)  TROPONIN I (HIGH SENSITIVITY)    EKG EKG Interpretation Date/Time:  Friday March 16 2023 05:11:23 EST Ventricular Rate:  88 PR Interval:  196 QRS Duration:  94 QT Interval:  328 QTC Calculation: 396 R Axis:   7  Text Interpretation: Normal sinus rhythm Nonspecific ST abnormality Abnormal ECG When compared with ECG of 28-Jun-2021 22:36, PREVIOUS ECG IS PRESENT No significant change was found Confirmed by Glynn Octave [phone]) on 03/16/2023 5:49:59 AM  Radiology No results found.  Procedures Procedures    Medications Ordered in ED Medications  acetaminophen (TYLENOL) tablet 650 mg (has no administration  in time range)  predniSONE (DELTASONE) tablet 60 mg (has no administration in time range)    ED Course/ Medical Decision Making/ A&P                                 Medical Decision Making Amount and/or Complexity of Data Reviewed Independent Historian: spouse Labs: ordered. Decision-making details documented in ED Course. Radiology: ordered and independent interpretation performed. Decision-making details documented in ED Course. ECG/medicine tests: ordered and independent interpretation performed. Decision-making details documented in ED Course.  Risk OTC drugs. Prescription drug management. Decision regarding hospitalization.  Mid back pain associated with shingles rash for the past several weeks.  No fever.  Vitals are stable.  No distress or fever  Intact distal strength, sensation, pulses and reflexes.  Low suspicion for cord compression or cauda equina.  Pain may be due to shingles itself as well as her hyper calcium.  Calcium has increased to 14 from 12 on November 11.  Will initiate IV fluids. No fecal impaction on digital exam.  Will obtain abdominal x-ray.  Recent CT scan of abdomen pelvis was reassuring. Hypercalcemia likely contributing to her constipation  She does have pain at the site of her shingles rash but hypercalcemia is likely contributing to her myalgias.  Creatinine is normal.  Will plan admission for further investigation of her hypercalcemia.  Low suspicion for ACS, PE, aortic dissection.  She did have a CT of her chest October 30 that was reassuring.  Calcium elevated today likely source of her myalgias.  Will hydrate and give IV Lasix.  Low suspicion for ACS, PE, arctic dissection. She is also given a dose of prednisone to help with her shingles pain.  Continue Valtrex.  Admission for IV fluids and further workup of her hypercalcemia discussed with Dr. Margo Aye.       Final Clinical Impression(s) / ED Diagnoses Final diagnoses:  Hypercalcemia   Herpes zoster without complication    Rx / DC Orders  ED Discharge Orders     None         Tanishi Nault, Jeannett Senior, MD 03/16/23 Sandi Mealy, MD 03/16/23 [phone]

## 2023-03-16 NOTE — ED Notes (Signed)
[facility] called for transport , No Current ETA.. ED Nurse will call floor for report Called @ 06:16 am

## 2023-03-16 NOTE — H&P (Signed)
History and Physical    Patient: Nicole Campbell MWU:[id number] DOB: Feb 15, 1946 DOA: 03/16/2023 DOS: the patient was seen and examined on 03/16/2023 PCP: Merri Brunette, MD  Patient coming from: Home  Chief Complaint:  Chief Complaint  Patient presents with   Back Pain   HPI: Nicole Campbell is a 77 y.o. female with medical history significant of CLL, hypertension, hypothyroidism, febrile neutropenia, chronic sinusitis, mitral valve prolapse, mixed hyperlipidemia, rheumatoid arthritis who presented with complaints of back pain, active herpes zoster currently on Valtrex and prednisone who presented to the emergency department with complaints of back pain in the area affected by her herpes zoster and was incidentally found to be hypercalcemic. No fever, chills or night sweats, but has been feeling fatigued. No sore throat, rhinorrhea, dyspnea, wheezing or hemoptysis. No chest pain, palpitations, diaphoresis, PND, orthopnea or pitting edema of the lower extremities. No appetite changes, abdominal pain, diarrhea,melena or hematochezia has been feeling constipated.  No flank pain, dysuria, frequency or hematuria.  No polyuria, polydipsia, polyphagia or blurred vision.  Lab work: Urine analysis showed trace hemoglobin and rare bacteria, but was otherwise unremarkable.  CBC was normal.  Troponin x 2 unremarkable.  Negative fecal occult blood.  CMP showed a chloride of 96 mmol/L, glucose 121, BUN 17, creatinine 1.10 and calcium 14.5 mg/dL.  The rest of the electrolytes were normal.  LFTs were unremarkable with EF section of an ALT level of 47 units/L.  Imaging: 2 view chest radiograph with no acute finding.  Normal heart size and aortic contours.  Abdominal x-ray showed moderate stool burden without obstructive pattern or rectal impaction.   ED course: Initial vital signs were temperature 97.8 F, pulse 95, respirations 16, BP 176/81 mmHg and O2 sat 100% on room air.  The patient received  acetaminophen 650 mg p.o. x 1, prednisone 60 mg p.o. x 1 2000 mL of normal saline bolus and 20 mg of furosemide IVP.  Review of Systems: As mentioned in the history of present illness. All other systems reviewed and are negative. Past Medical History:  Diagnosis Date   CLL (chronic lymphocytic leukemia) (HCC)    Hypertension    Hypothyroid    MVP (mitral valve prolapse)    Rheumatoid arthritis (HCC)    Past Surgical History:  Procedure Laterality Date   ABDOMINAL HYSTERECTOMY     IR THORACENTESIS ASP PLEURAL SPACE W/IMG GUIDE  11/21/2019   IR THORACENTESIS ASP PLEURAL SPACE W/IMG GUIDE  07/05/2020   TONSILLECTOMY     Social History:  reports that she has never smoked. She has never used smokeless tobacco. She reports current alcohol use. She reports that she does not use drugs.  Allergies  Allergen Reactions   Methotrexate Other (See Comments)    horrible lower back pain   Erythromycin Palpitations    Family History  Problem Relation Age of Onset   Congestive Heart Failure Father     Prior to Admission medications   Medication Sig Start Date End Date Taking? Authorizing Provider  acetaminophen (TYLENOL) 650 MG CR tablet Take 1,300 mg by mouth every 8 (eight) hours as needed for pain.   Yes [provider]  escitalopram (LEXAPRO) 5 MG tablet Take 5 mg by mouth daily. 12/15/20  Yes [provider]  levothyroxine (SYNTHROID, LEVOTHROID) 25 MCG tablet Take 25 mcg by mouth daily before breakfast.  04/30/17  Yes [provider]  valACYclovir (VALTREX) 500 MG tablet Take 1,000 mg by mouth 3 (three) times daily. 03/07/23  Yes  [provider]    Physical Exam: Vitals:   03/16/23 0750 03/16/23 1054 03/16/23 1200 03/16/23 1228  BP:  (!) 173/92 (!) 172/84 (!) 170/80  Pulse: 96 (!) 103 85 85  Resp: 18 13 17 16   Temp: (!) 97.1 F (36.2 C) 98.1 F (36.7 C) 98.1 F (36.7 C) 98.3 F (36.8 C)  TempSrc: Oral Oral  Oral  SpO2: 97% 98% 96% 96%  Weight:       Height:       Physical Exam Vitals and nursing note reviewed.  Constitutional:      General: She is awake. She is not in acute distress.    Appearance: Normal appearance. She is ill-appearing.  HENT:     Head: Normocephalic.     Nose: No rhinorrhea.     Mouth/Throat:     Mouth: Mucous membranes are moist.  Eyes:     General: No scleral icterus.    Pupils: Pupils are equal, round, and reactive to light.  Neck:     Vascular: No JVD.  Cardiovascular:     Rate and Rhythm: Normal rate and regular rhythm.     Heart sounds: S1 normal and S2 normal.  Pulmonary:     Effort: Pulmonary effort is normal.     Breath sounds: Normal breath sounds. No wheezing, rhonchi or rales.  Abdominal:     General: Bowel sounds are normal. There is no distension.     Palpations: Abdomen is soft.     Tenderness: There is no abdominal tenderness.  Musculoskeletal:     Cervical back: Neck supple.     Right lower leg: No edema.     Left lower leg: No edema.  Skin:    General: Skin is warm and dry.  Neurological:     General: No focal deficit present.     Mental Status: She is alert and oriented to person, place, and time.  Psychiatric:        Mood and Affect: Mood normal.        Behavior: Behavior normal. Behavior is cooperative.    Data Reviewed:  Results are pending, will review when available. 06/17/2021 Limited transthoracic echocardiogram IMPRESSIONS:   1. Left ventricular ejection fraction, by estimation, is 60 to 65%. The  left ventricle has normal function. The left ventricle has no regional  wall motion abnormalities. mild focal basal septal left ventricular  hypertrophy. Left ventricular diastolic  parameters were normal. The average left ventricular global longitudinal  strain is -18.4 %. The global longitudinal strain is normal.   2. Right ventricular systolic function is normal. The right ventricular  size is normal. There is normal pulmonary artery systolic pressure. The   estimated right ventricular systolic pressure is 32.2 mmHg.   3. The mitral valve is normal in structure. Trivial mitral valve  regurgitation. No evidence of mitral stenosis.   4. The aortic valve is tricuspid. Aortic valve regurgitation is not  visualized. No aortic stenosis is present.   5. The inferior vena cava is normal in size with greater than 50%  respiratory variability, suggesting right atrial pressure of 3 mmHg.   EKG: Vent. rate 88 BPM PR interval 196 ms QRS duration 94 ms QT/QTcB 328/396 ms P-R-T axes 72 7 34 Normal sinus rhythm Nonspecific ST abnormality Abnormal ECG  Assessment and Plan: Principal Problem:   Hypercalcemia PCU/inpatient. Continue IV fluids. Zoledronic acid 4 mg IVPB x 1. Discussed with Dr. Candise Che. -Recommendations and consult appreciated. Check vitamin D level, calcitriol, PTH  level. -Multiple myeloma panel, kappa/lambda chains, ACE level. Follow calcium level in AM.  Active Problems:   CLL (chronic lymphocytic leukemia) (HCC) Following with Dr. Candise Che.    Hypothyroidism, acquired Continue levothyroxine 25 mcg p.o. daily.    Mixed hyperlipidemia Continue lifestyle modifications. Follow-up with PCP.    Herpes zoster infection Continue valacyclovir. Continue prednisone. Analgesics as needed. Antiemetics as needed.    Advance Care Planning:   Code Status: Full Code   Consults: Hematology/oncology (Dr. Candise Che).  Family Communication:   Severity of Illness: The appropriate patient status for this patient is INPATIENT. Inpatient status is judged to be reasonable and necessary in order to provide the required intensity of service to ensure the patient's safety. The patient's presenting symptoms, physical exam findings, and initial radiographic and laboratory data in the context of their chronic comorbidities is felt to place them at high risk for further clinical deterioration. Furthermore, it is not anticipated that the patient will be  medically stable for discharge from the hospital within 2 midnights of admission.   * I certify that at the point of admission it is my clinical judgment that the patient will require inpatient hospital care spanning beyond 2 midnights from the point of admission due to high intensity of service, high risk for further deterioration and high frequency of surveillance required.*  Author: Bobette Mo, MD 03/16/2023 1:18 PM  For on call review [email or web address].   This document was prepared using Dragon voice recognition software and may contain some unintended transcription errors.

## 2023-03-16 NOTE — ED Notes (Signed)
Bathroom via wheelchair

## 2023-03-16 NOTE — ED Triage Notes (Signed)
Pt states has shingles since 11/6. Is on valtrex and other meds. Took them last night about 09:30 pm with no relief.

## 2023-03-17 DIAGNOSIS — E782 Mixed hyperlipidemia: Secondary | ICD-10-CM | POA: Diagnosis not present

## 2023-03-17 DIAGNOSIS — E039 Hypothyroidism, unspecified: Secondary | ICD-10-CM | POA: Diagnosis not present

## 2023-03-17 DIAGNOSIS — C9110 Chronic lymphocytic leukemia of B-cell type not having achieved remission: Secondary | ICD-10-CM | POA: Diagnosis not present

## 2023-03-17 LAB — COMPREHENSIVE METABOLIC PANEL
ALT: 32 U/L (ref 0–44)
AST: 18 U/L (ref 15–41)
Albumin: 3 g/dL — ABNORMAL LOW (ref 3.5–5.0)
Alkaline Phosphatase: 77 U/L (ref 38–126)
Anion gap: 6 (ref 5–15)
BUN: 21 mg/dL (ref 8–23)
CO2: 27 mmol/L (ref 22–32)
Calcium: 12.8 mg/dL — ABNORMAL HIGH (ref 8.9–10.3)
Chloride: 98 mmol/L (ref 98–111)
Creatinine, Ser: 0.86 mg/dL (ref 0.44–1.00)
GFR, Estimated: >60 mL/min (ref >60)
Glucose, Bld: 134 mg/dL — ABNORMAL HIGH (ref 70–99)
Potassium: 3.3 mmol/L — ABNORMAL LOW (ref 3.5–5.1)
Sodium: 131 mmol/L — ABNORMAL LOW (ref 135–145)
Total Bilirubin: 0.6 mg/dL (ref <1.2)
Total Protein: 5 g/dL — ABNORMAL LOW (ref 6.5–8.1)

## 2023-03-17 LAB — CBC
HCT: 39.2 % (ref 36.0–46.0)
Hemoglobin: 12.9 g/dL (ref 12.0–15.0)
MCH: 29.5 pg (ref 26.0–34.0)
MCHC: 32.9 g/dL (ref 30.0–36.0)
MCV: 89.5 fL (ref 80.0–100.0)
Platelets: 234 10*3/uL (ref 150–400)
RBC: 4.38 MIL/uL (ref 3.87–5.11)
RDW: 13.2 % (ref 11.5–15.5)
WBC: 6.9 10*3/uL (ref 4.0–10.5)
nRBC: 0 % (ref 0.0–0.2)

## 2023-03-17 LAB — BETA 2 MICROGLOBULIN, SERUM: Beta-2 Microglobulin: 3.3 mg/L — ABNORMAL HIGH (ref 0.6–2.4)

## 2023-03-17 LAB — ANGIOTENSIN CONVERTING ENZYME: Angiotensin-Converting Enzyme: 44 U/L (ref 14–82)

## 2023-03-17 MED ORDER — KCL IN DEXTROSE-NACL 20-5-0.9 MEQ/L-%-% IV SOLN
INTRAVENOUS | Status: AC
  Filled 2023-03-17 (×3): qty 1000

## 2023-03-17 NOTE — Plan of Care (Signed)

## 2023-03-17 NOTE — Progress Notes (Signed)
PROGRESS NOTE    Nicole Campbell  XLK:[id number] DOB: [birth year] DOA: 03/16/2023 PCP: Merri Brunette, MD    Brief Narrative:   Nicole Campbell is a 77 y.o. female with medical history  of CLL, hypertension, hypothyroidism, mitral valve prolapse, mixed hyperlipidemia, rheumatoid arthritis presented to hospital with back pain at the site of active herpes zoster on the back, currently on Valtrex and prednisone.  Patient was however noted to have hypercalcemia in the ED. patient had stable vitals.  CBC was normal.  Troponin x 2 unremarkable.  Negative fecal occult blood.  CMP showed calcium of 14.5.  The rest of the electrolytes were normal.  LFTs were unremarkable with EF section of an ALT level of 47 units/L.  Chest x-ray without any acute infiltrate.  Patient received prednisone and normal saline in the ED including 20 mg of Lasix and was admitted to the hospital for further evaluation and treatment.  Assessment and Plan:   Hypercalcemia Continue IV fluids.  Slightly improved.  Patient was seen by oncology  Dr Candise Che and received Zoledronic acid 4 mg IVPB x 1.  Plans to continue prednisone 60 mg p.o. daily for 5 days.  PTH related peptide, calcitriol, PTH pending.  Vitamin D level of 36.  Multiple myeloma panel pending.  FOBT negative.  Calcium has trended down to 12.8 from 14.4 on presentation.  Will continue to monitor.  Mild hypokalemia.  Potassium 3.3 today.  Will continue with D5 the water with KCl.  Check levels in AM.     CLL (chronic lymphocytic leukemia) Following with Dr. Candise Che.  Plan to continue prednisone.  Was on Imbruvica as outpatient.  Oncology to follow.  No leukocytosis.     Hypothyroidism, acquired Continue Synthroid     Mixed hyperlipidemia Not on medications     Herpes zoster infection Continue valacyclovir, prednisone.  Continue analgesics antiemetics      DVT prophylaxis: enoxaparin (LOVENOX) injection 40 mg Start: 03/16/23 2200   Code Status:     Code  Status: Full Code  Disposition: Home likely in 1 to 2 days Status is: Inpatient Remains inpatient appropriate because: Hypercalcemia, IV fluids,    Family Communication: None at bedside  Consultants:  Oncology  Procedures:  None  Antimicrobials:  Valacyclovir  Anti-infectives (From admission, onward)    Start     Dose/Rate Route Frequency Ordered Stop   03/16/23 1600  valACYclovir (VALTREX) tablet 1,000 mg        1,000 mg Oral 3 times daily 03/16/23 1335          Subjective: Today, patient was seen and examined at bedside.  Patient states that her pain on the back is better.  Has mild nausea but appetite slightly getting better.  Has had a bowel movement yesterday.  Denies any vomiting, dizziness, lightheadedness.  Feels slightly weak  Objective: Vitals:   03/16/23 1330 03/16/23 2128 03/17/23 0128 03/17/23 0613  BP: (!) 169/83 132/76 134/66 133/70  Pulse: (!) 102 93 88 98  Resp: 18 20 18 18   Temp: 97.6 F (36.4 C) 98.5 F (36.9 C) 98.5 F (36.9 C) 98.4 F (36.9 C)  TempSrc: Oral Oral Oral Oral  SpO2: 99% 100% 99% 100%  Weight:      Height:        Intake/Output Summary (Last 24 hours) at 03/17/2023 0929 Last data filed at 03/17/2023 0815 Gross per 24 hour  Intake 2222.38 ml  Output --  Net 2222.38 ml   Filed Weights   03/16/23  0404 03/16/23 1328  Weight: 73.5 kg 72.5 kg    Physical Examination: Body mass index is 25.8 kg/m.  General:  Average built, not in obvious distress, Communicative, elderly female, HENT:   No scleral pallor or icterus noted. Oral mucosa is moist.  Chest:  Clear breath sounds.  Diminished breath sounds bilaterally. No crackles or wheezes.  CVS: S1 &S2 heard. No murmur.  Regular rate and rhythm. Abdomen: Soft, nontender, nondistended.  Bowel sounds are heard.   Extremities: No cyanosis, clubbing or edema.  Peripheral pulses are palpable. Psych: Alert, awake and oriented, normal mood CNS:  No cranial nerve deficits.  Power equal  in all extremities.   Skin: Warm and dry.  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 with erythema and drying blisters  Data Reviewed:   CBC: Recent Labs  Lab 03/12/23 1213 03/16/23 0435 03/17/23 0437  WBC 5.3 6.5 6.9  NEUTROABS 3.0 2.8  --   HGB 13.0 14.0 12.9  HCT 38.6 42.0 39.2  MCV 87.7 86.6 89.5  PLT 196 256 234    Basic Metabolic Panel: Recent Labs  Lab 03/12/23 1213 03/12/23 1555 03/16/23 0435 03/16/23 1354 03/17/23 0437  NA 135  --  136 138 131*  K 4.0  --  3.8 3.7 3.3*  CL 97*  --  96* 100 98  CO2 34*  --  32 28 27  GLUCOSE 127*  --  121* 174* 134*  BUN 21  --  17 17 21   CREATININE 1.10*  --  1.10* 0.88 0.86  CALCIUM 12.6* 12.4* 14.5* 14.4* 12.8*  MG  --   --   --  1.9  --   PHOS  --   --   --  4.2  --     Liver Function Tests: Recent Labs  Lab 03/12/23 1213 03/16/23 0435 03/17/23 0437  AST 26 37 18  ALT 46* 47* 32  ALKPHOS 100 107 77  BILITOT 0.5 0.6 0.6  PROT 5.9* 6.6 5.0*  ALBUMIN 3.6 3.8 3.0*     Radiology Studies: DG Bone Survey Met  Result Date: 03/16/2023 CLINICAL DATA:  Low back pain, shooting levels, history of CLL EXAM: METASTATIC BONE SURVEY COMPARISON:  02/28/2023 FINDINGS: Standard metastatic bone survey was performed. Lateral skull: No acute or destructive bony abnormalities. Upper extremities: Frontal views of the upper extremities are obtained from the shoulders through the wrists. Punctate lucency within the proximal right humeral metadiaphyseal junction. No other acute or destructive bony abnormalities. Spine: Frontal and lateral views are obtained. There are no acute or destructive bony abnormalities. Mild diffuse spondylosis greatest in the cervical spine and lumbosacral junction. Chest: Frontal view of the chest demonstrates an unremarkable cardiac silhouette. Bilateral pleural thickening and trace right effusion again noted. No airspace disease or pneumothorax. No acute or destructive rib abnormality. Pelvis: Frontal view of the pelvis  includes both hips. No acute or destructive bony abnormalities. Symmetrical bilateral hip osteoarthritis. Lower extremities: Frontal views are obtained from the hips through the ankles bilaterally. There are no acute or destructive bony abnormalities. IMPRESSION: 1. Punctate lucency within the proximal right humeral metadiaphyseal junction, nonspecific the could be related to patient's known history of CLL. 2. No acute bony abnormalities. 3. Mild spondylosis throughout the spine, most pronounced at the lumbosacral junction. This could explain the patient's low back pain. No evidence of acute spine fracture Electronically Signed   By: Sharlet Salina M.D.   On: 03/16/2023 20:51   DG Abd 2 Views  Result Date: 03/16/2023  CLINICAL DATA:  Constipation EXAM: ABDOMEN - 2 VIEW COMPARISON:  CT of the abdomen 02/28/2023 FINDINGS: Formed stool throughout much of the colon but no rectal impaction or signs of obstruction. No over distension of the colon. Phleboliths are present. No concerning mass effect or gas collection. IMPRESSION: Moderate stool without obstructive pattern or rectal impaction. Electronically Signed   By: Tiburcio Pea M.D.   On: 03/16/2023 07:39   DG Chest 2 View  Result Date: 03/16/2023 CLINICAL DATA:  Chest pain.  Shingles for a week EXAM: CHEST - 2 VIEW COMPARISON:  06/28/2021 FINDINGS: Chronic hyperinflation and costophrenic sulcus blunting from scarring by CT. There is no edema, consolidation, effusion, or pneumothorax. Normal heart size and aortic contours. IMPRESSION: No acute finding when compared to prior. Electronically Signed   By: Tiburcio Pea M.D.   On: 03/16/2023 07:38      LOS: 1 day    Joycelyn Das, MD [facility] Available via Epic secure chat 7am-7pm After these hours, please refer to coverage provider listed on [email or web address] 03/17/2023, 9:29 AM

## 2023-03-17 NOTE — Hospital Course (Signed)
Nicole Campbell is a 77 y.o. female with medical history  of CLL, hypertension, hypothyroidism, mitral valve prolapse, mixed hyperlipidemia, rheumatoid arthritis presented to hospital with back pain at the site of active herpes zoster on the back, currently on Valtrex and prednisone.  Patient was however noted to have hypercalcemia in the ED. patient had stable vitals.  CBC was normal.  Troponin x 2 unremarkable.  Negative fecal occult blood.  CMP showed calcium of 14.5.  The rest of the electrolytes were normal.  LFTs were unremarkable with EF section of an ALT level of 47 units/L.  Chest x-ray without any acute infiltrate.  Patient received prednisone and normal saline in the ED including 20 mg of Lasix and was admitted to hospital for further evaluation and treatment.  Assessment and Plan:   Hypercalcemia Continue IV fluids.  Slightly improved.  Patient was seen by oncology  Dr Candise Che and received Zoledronic acid 4 mg IVPB x 1.  Plans to continue prednisone 60 mg p.o. daily for 5 days.  PTH related peptide, calcitriol, PTH pending.  Vitamin D level of 36.  Multiple myeloma panel pending.  FOBT negative.  Calcium has trended down to 12.8 from 14.4 on presentation.  Will continue to monitor.  Mild hypokalemia.  Potassium 3.3 today.  Will continue with D5 the water with KCl.  Check levels in AM.     CLL (chronic lymphocytic leukemia) Following with Dr. Candise Che.  Plan to continue prednisone.  Was on Imbruvica as outpatient.  Oncology to follow.  No leukocytosis.     Hypothyroidism, acquired Continue Synthroid     Mixed hyperlipidemia Not on medications     Herpes zoster infection Continue valacyclovir, prednisone.  Continue analgesics antiemetics

## 2023-03-18 DIAGNOSIS — E782 Mixed hyperlipidemia: Secondary | ICD-10-CM

## 2023-03-18 DIAGNOSIS — C9110 Chronic lymphocytic leukemia of B-cell type not having achieved remission: Secondary | ICD-10-CM | POA: Diagnosis not present

## 2023-03-18 DIAGNOSIS — B029 Zoster without complications: Secondary | ICD-10-CM | POA: Diagnosis not present

## 2023-03-18 DIAGNOSIS — E039 Hypothyroidism, unspecified: Secondary | ICD-10-CM | POA: Diagnosis not present

## 2023-03-18 LAB — BASIC METABOLIC PANEL
Anion gap: 6 (ref 5–15)
BUN: 28 mg/dL — ABNORMAL HIGH (ref 8–23)
CO2: 26 mmol/L (ref 22–32)
Calcium: 10.7 mg/dL — ABNORMAL HIGH (ref 8.9–10.3)
Chloride: 106 mmol/L (ref 98–111)
Creatinine, Ser: 0.96 mg/dL (ref 0.44–1.00)
GFR, Estimated: >60 mL/min (ref >60)
Glucose, Bld: 141 mg/dL — ABNORMAL HIGH (ref 70–99)
Potassium: 4.1 mmol/L (ref 3.5–5.1)
Sodium: 138 mmol/L (ref 135–145)

## 2023-03-18 LAB — CBC
HCT: 38.3 % (ref 36.0–46.0)
Hemoglobin: 12.4 g/dL (ref 12.0–15.0)
MCH: 29.8 pg (ref 26.0–34.0)
MCHC: 32.4 g/dL (ref 30.0–36.0)
MCV: 92.1 fL (ref 80.0–100.0)
Platelets: 196 10*3/uL (ref 150–400)
RBC: 4.16 MIL/uL (ref 3.87–5.11)
RDW: 13.3 % (ref 11.5–15.5)
WBC: 7.5 10*3/uL (ref 4.0–10.5)
nRBC: 0 % (ref 0.0–0.2)

## 2023-03-18 LAB — CALCIUM, IONIZED: Calcium, Ionized, Serum: 7.8 mg/dL — ABNORMAL HIGH (ref 4.5–5.6)

## 2023-03-18 LAB — MAGNESIUM: Magnesium: 2.2 mg/dL (ref 1.7–2.4)

## 2023-03-18 MED ORDER — PREDNISONE 20 MG PO TABS: 60.0000 mg | ORAL_TABLET | Freq: Every day | ORAL | 0 refills | Status: AC

## 2023-03-18 NOTE — Plan of Care (Signed)
  Problem: Clinical Measurements: Goal: Will remain free from infection Outcome: Progressing Goal: Diagnostic test results will improve Outcome: Progressing   Problem: Nutrition: Goal: Adequate nutrition will be maintained Outcome: Progressing   Problem: Safety: Goal: Ability to remain free from injury will improve Outcome: Progressing   

## 2023-03-18 NOTE — Discharge Summary (Signed)
Physician Discharge Summary  Nicole Campbell ZOX:[id number] DOB: 1946/01/20 DOA: 03/16/2023  PCP: Merri Brunette, MD  Admit date: 03/16/2023 Discharge date: 03/18/2023  Admitted From: Home  Discharge disposition: Home   Recommendations for Outpatient Follow-Up:   Follow up with your primary care provider in one week.  Check CBC, BMP, magnesium in the next visit Follow-up hypercalcemia workup labs  sent from the hospital as outpatient.  Encouraged oral hydration. Follow-up with oncology and discussed about further care, follow hypercalcemia labs, decision on initiating Imbruvica as outpatient.   Discharge Diagnosis:   Principal Problem:   Hypercalcemia Active Problems:   CLL (chronic lymphocytic leukemia) (HCC)   Hypothyroidism, acquired   Mixed hyperlipidemia   Herpes zoster infection    Discharge Condition: Improved.  Diet recommendation: Low sodium, heart healthy.    Wound care: None.  Code status: Full.   History of Present Illness:   Nicole Campbell is a 77 y.o. female with medical history  of CLL, hypertension, hypothyroidism, mitral valve prolapse, mixed hyperlipidemia, rheumatoid arthritis presented to hospital with back pain at the site of active herpes zoster on the back, currently on Valtrex and prednisone.  Patient was however noted to have hypercalcemia in the ED. she also endorsed nausea abdominal pain and decreased appetite at home.  Patient had stable vitals.  CBC was normal.  Troponin x 2 unremarkable.  Negative fecal occult blood.  CMP showed calcium of 14.5.  The rest of the electrolytes were normal.  LFTs were unremarkable with EF section of an ALT level of 47 units/L.  Chest x-ray without any acute infiltrate.  Patient received prednisone and normal saline in the ED including 20 mg of Lasix and was admitted to the hospital for further evaluation and treatment.    Hospital Course:   Following conditions were addressed during  hospitalization as listed below,  Hypercalcemia Received IV fluids, Zometa during hospitalization with improvement in calcium levels from initial 14.4 to 10.7.  Patient was seen by oncology  Dr Candise Che. Plans to continue prednisone 60 mg p.o. daily for 5 days.  PTH related peptide, calcitriol, PTH pending.  Vitamin D level of 36.  Multiple myeloma panel pending.  FOBT negative.    Mild hypokalemia.  Improved after replacement.    CLL (chronic lymphocytic leukemia) Following with Dr. Candise Che.  Plan to continue prednisone.  Was on Imbruvica as outpatient.  Oncology to follow and decide on restarting as outpatient..  No leukocytosis.     Hypothyroidism, acquired Continue Synthroid     Mixed hyperlipidemia Not on medications     Herpes zoster infection Continue valacyclovir, prednisone.  No active blistering disease.  Disposition.  At this time, patient is stable for disposition home with outpatient PCP, oncology follow-up  Medical Consultants:   Oncology  Procedures:    None Subjective:   Today, patient was seen and examined at bedside.  Patient states that she feels much better appetite has improved no nausea vomiting or abdominal pain.  Wishes to go home.  Discharge Exam:   Vitals:   03/18/23 0436 03/18/23 0700  BP: (!) 126/56   Pulse: 86   Resp: (!) 21 18  Temp: 98.5 F (36.9 C)   SpO2: 97%    Vitals:   03/17/23 1316 03/17/23 2006 03/18/23 0436 03/18/23 0700  BP: 127/65 112/65 (!) 126/56   Pulse: 95 91 86   Resp:  18 (!) 21 18  Temp: 97.9 F (36.6 C) 98.3 F (36.8 C) 98.5 F (36.9 C)  TempSrc: Oral Oral Oral   SpO2: 97% 96% 97%   Weight:      Height:       Body mass index is 25.8 kg/m.  General: Alert awake, not in obvious distress, Communicative, HENT: pupils equally reacting to light,  No scleral pallor or icterus noted. Oral mucosa is moist.  Chest:  Clear breath sounds.  . No crackles or wheezes.  CVS: S1 &S2 heard. No murmur.  Regular rate and  rhythm. Abdomen: Soft, nontender, nondistended.  Bowel sounds are heard.   Extremities: No cyanosis, clubbing or edema.  Peripheral pulses are palpable. Psych: Alert, awake and oriented, normal mood CNS:  No cranial nerve deficits.  Power equal in all extremities.   Skin: Warm and dry.  Zoster rash noted on the back.  The results of significant diagnostics from this hospitalization (including imaging, microbiology, ancillary and laboratory) are listed below for reference.     Diagnostic Studies:   DG Bone Survey Met  Result Date: 03/16/2023 CLINICAL DATA:  Low back pain, shooting levels, history of CLL EXAM: METASTATIC BONE SURVEY COMPARISON:  02/28/2023 FINDINGS: Standard metastatic bone survey was performed. Lateral skull: No acute or destructive bony abnormalities. Upper extremities: Frontal views of the upper extremities are obtained from the shoulders through the wrists. Punctate lucency within the proximal right humeral metadiaphyseal junction. No other acute or destructive bony abnormalities. Spine: Frontal and lateral views are obtained. There are no acute or destructive bony abnormalities. Mild diffuse spondylosis greatest in the cervical spine and lumbosacral junction. Chest: Frontal view of the chest demonstrates an unremarkable cardiac silhouette. Bilateral pleural thickening and trace right effusion again noted. No airspace disease or pneumothorax. No acute or destructive rib abnormality. Pelvis: Frontal view of the pelvis includes both hips. No acute or destructive bony abnormalities. Symmetrical bilateral hip osteoarthritis. Lower extremities: Frontal views are obtained from the hips through the ankles bilaterally. There are no acute or destructive bony abnormalities. IMPRESSION: 1. Punctate lucency within the proximal right humeral metadiaphyseal junction, nonspecific the could be related to patient's known history of CLL. 2. No acute bony abnormalities. 3. Mild spondylosis throughout  the spine, most pronounced at the lumbosacral junction. This could explain the patient's low back pain. No evidence of acute spine fracture Electronically Signed   By: Sharlet Salina M.D.   On: 03/16/2023 20:51   DG Abd 2 Views  Result Date: 03/16/2023 CLINICAL DATA:  Constipation EXAM: ABDOMEN - 2 VIEW COMPARISON:  CT of the abdomen 02/28/2023 FINDINGS: Formed stool throughout much of the colon but no rectal impaction or signs of obstruction. No over distension of the colon. Phleboliths are present. No concerning mass effect or gas collection. IMPRESSION: Moderate stool without obstructive pattern or rectal impaction. Electronically Signed   By: Tiburcio Pea M.D.   On: 03/16/2023 07:39   DG Chest 2 View  Result Date: 03/16/2023 CLINICAL DATA:  Chest pain.  Shingles for a week EXAM: CHEST - 2 VIEW COMPARISON:  06/28/2021 FINDINGS: Chronic hyperinflation and costophrenic sulcus blunting from scarring by CT. There is no edema, consolidation, effusion, or pneumothorax. Normal heart size and aortic contours. IMPRESSION: No acute finding when compared to prior. Electronically Signed   By: Tiburcio Pea M.D.   On: 03/16/2023 07:38     Labs:   Basic Metabolic Panel: Recent Labs  Lab 03/12/23 1213 03/12/23 1555 03/16/23 0435 03/16/23 1354 03/17/23 0437 03/18/23 0450  NA 135  --  136 138 131* 138  K 4.0  --  3.8 3.7 3.3* 4.1  CL 97*  --  96* 100 98 106  CO2 34*  --  32 28 27 26   GLUCOSE 127*  --  121* 174* 134* 141*  BUN 21  --  17 17 21  28*  CREATININE 1.10*  --  1.10* 0.88 0.86 0.96  CALCIUM 12.6* 12.4* 14.5* 14.4* 12.8* 10.7*  MG  --   --   --  1.9  --  2.2  PHOS  --   --   --  4.2  --   --    GFR Estimated Creatinine Clearance: 50.8 mL/min (by C-G formula based on SCr of 0.96 mg/dL). Liver Function Tests: Recent Labs  Lab 03/12/23 1213 03/16/23 0435 03/17/23 0437  AST 26 37 18  ALT 46* 47* 32  ALKPHOS 100 107 77  BILITOT 0.5 0.6 0.6  PROT 5.9* 6.6 5.0*  ALBUMIN 3.6  3.8 3.0*   No results for input(s): "LIPASE", "AMYLASE" in the last 168 hours. No results for input(s): "AMMONIA" in the last 168 hours. Coagulation profile No results for input(s): "INR", "PROTIME" in the last 168 hours.  CBC: Recent Labs  Lab 03/12/23 1213 03/16/23 0435 03/17/23 0437 03/18/23 0450  WBC 5.3 6.5 6.9 7.5  NEUTROABS 3.0 2.8  --   --   HGB 13.0 14.0 12.9 12.4  HCT 38.6 42.0 39.2 38.3  MCV 87.7 86.6 89.5 92.1  PLT 196 256 234 196   Cardiac Enzymes: No results for input(s): "CKTOTAL", "CKMB", "CKMBINDEX", "TROPONINI" in the last 168 hours. BNP: Invalid input(s): "POCBNP" CBG: No results for input(s): "GLUCAP" in the last 168 hours. D-Dimer No results for input(s): "DDIMER" in the last 72 hours. Hgb A1c No results for input(s): "HGBA1C" in the last 72 hours. Lipid Profile No results for input(s): "CHOL", "HDL", "LDLCALC", "TRIG", "CHOLHDL", "LDLDIRECT" in the last 72 hours. Thyroid function studies No results for input(s): "TSH", "T4TOTAL", "T3FREE", "THYROIDAB" in the last 72 hours.  Invalid input(s): "FREET3" Anemia work up No results for input(s): "VITAMINB12", "FOLATE", "FERRITIN", "TIBC", "IRON", "RETICCTPCT" in the last 72 hours. Microbiology No results found for this or any previous visit (from the past 240 hour(s)).   Discharge Instructions:   Discharge Instructions     Call MD for:  persistant nausea and vomiting   Complete by: As directed    Call MD for:  severe uncontrolled pain   Complete by: As directed    Diet general   Complete by: As directed    Discharge instructions   Complete by: As directed    Follow-up with your primary care provider in 1 week.  Check blood work at that time.  Follow-up with Dr. Candise Che in the clinic in 1 to 2 days to discuss about calcium labs and imbruvica.  Seek medical attention for worsening symptoms.  Increase your fluid intake at home.  Do not take calcium supplements.  Seek medical attention for worsening  symptoms   Increase activity slowly   Complete by: As directed       Allergies as of 03/18/2023       Reactions   Methotrexate Other (See Comments)   horrible lower back pain   Erythromycin Palpitations        Medication List     TAKE these medications    acetaminophen 650 MG CR tablet Commonly known as: TYLENOL Take 1,300 mg by mouth every 8 (eight) hours as needed for pain.   escitalopram 5 MG tablet Commonly known as: LEXAPRO Take 5  mg by mouth daily.   levothyroxine 25 MCG tablet Commonly known as: SYNTHROID Take 25 mcg by mouth daily before breakfast.   predniSONE 20 MG tablet Commonly known as: DELTASONE Take 3 tablets (60 mg total) by mouth daily with breakfast for 3 days. Start taking on: March 19, 2023   valACYclovir 500 MG tablet Commonly known as: VALTREX Take 1,000 mg by mouth 3 (three) times daily.        Follow-up Information     Merri Brunette, MD Follow up in 1 week(s).   Specialty: Family Medicine Contact information: [phone] [place] [address] [phone]         Johney Maine, MD Follow up in 2 day(s).   Specialties: Hematology, Oncology Why: call for followup, calcium checking, med discussion Contact information: [address] [place] [address] [phone]                  Time coordinating discharge: 39 minutes  Signed:  Phinehas Grounds  [facility] 03/18/2023, 9:20 AM

## 2023-03-18 NOTE — Progress Notes (Signed)
AVS instructions reviewed with patient per orders. Care plan and education completed. PIV removed. All belongings sent home with patient. Patient was transported downstairs, with a mask, in the wheelchair by RN and NT. Patient's husband transported her home.

## 2023-03-19 ENCOUNTER — Encounter: Payer: Self-pay | Admitting: Hematology

## 2023-03-19 LAB — KAPPA/LAMBDA LIGHT CHAINS
Kappa free light chain: 6.9 mg/L (ref 3.3–19.4)
Kappa, lambda light chain ratio: 0.03 — ABNORMAL LOW (ref 0.26–1.65)
Lambda free light chains: 218.3 mg/L — ABNORMAL HIGH (ref 5.7–26.3)

## 2023-03-19 LAB — PTH, INTACT AND CALCIUM
Calcium, Total (PTH): 14.3 mg/dL (ref 8.7–10.3)
PTH: 5 pg/mL — ABNORMAL LOW (ref 15–65)

## 2023-03-19 LAB — CALCITRIOL (1,25 DI-OH VIT D): Vit D, 1,25-Dihydroxy: 8.6 pg/mL — ABNORMAL LOW (ref 24.8–81.5)

## 2023-03-20 ENCOUNTER — Other Ambulatory Visit (HOSPITAL_COMMUNITY): Payer: Self-pay

## 2023-03-20 ENCOUNTER — Telehealth: Payer: Self-pay | Admitting: Pharmacy Technician

## 2023-03-20 ENCOUNTER — Other Ambulatory Visit: Payer: Self-pay | Admitting: Hematology

## 2023-03-20 ENCOUNTER — Other Ambulatory Visit: Payer: Self-pay

## 2023-03-20 ENCOUNTER — Telehealth: Payer: Self-pay | Admitting: Pharmacist

## 2023-03-20 ENCOUNTER — Other Ambulatory Visit: Payer: Self-pay | Admitting: Pharmacy Technician

## 2023-03-20 MED ORDER — IBRUTINIB 420 MG PO TABS
420.0000 mg | ORAL_TABLET | Freq: Every day | ORAL | 5 refills | Status: DC
  Filled 2023-03-20: qty 28, 28d supply, fill #0
  Filled 2023-04-05: qty 28, 28d supply, fill #1

## 2023-03-20 NOTE — Progress Notes (Signed)
Oral Chemotherapy Pharmacist Encounter  Patient was counseled under telephone encounter from 03/20/23.  Lenord Carbo, PharmD, BCPS, BCOP Hematology/Oncology Clinical Pharmacist [facility] Oral Chemotherapy Navigation Clinics [phone] 03/20/2023 11:18 AM

## 2023-03-20 NOTE — Telephone Encounter (Signed)
Oral Oncology Pharmacist Encounter  Received new prescription for Imbruvica (ibrutinib) for the re-initiation of treatment for CLL, planned duration until disease progression or unacceptable drug toxicity.  CBC and CMP from 03/17/23 assessed, no relevant lab abnormalities requiring baseline dose adjustment required at this time. Prescription dose and frequency assessed for appropriateness.  Current medication list in Epic reviewed, DDIs with Imbruvica identified: Category C drug-drug interaction between Imbruvica and Lexapro - Lexapro can increase antiplatelet effects of Imbruvica. Recommend monitoring patient for increased s/sx of bleeding/bruising. Patient pltc on 03/17/23 234 K/uL. No changes in therapy warranted at this time.   Evaluated chart and no patient barriers to medication adherence noted.   Prescription has been e-scribed to the [facility] for benefits analysis and approval.  Oral Oncology Clinic will continue to follow for insurance authorization, copayment issues, initial counseling and start date.  Lenord Carbo, PharmD, BCPS, [facility] Hematology/Oncology Clinical Pharmacist [facility] Oral Chemotherapy Navigation Clinics [phone] 03/20/2023 10:57 AM

## 2023-03-20 NOTE — Telephone Encounter (Signed)
Oral Oncology Patient Advocate Encounter  Was successful in securing patient a $8,000 grant from [employer] to provide copayment coverage for Imbruvica.  This will keep the out of pocket expense at $0.     [employer] ID: [id number]  I have spoken with the patient.   The billing information is as follows and has been shared with [facility].    RxBin: [id number] PCN: [id number] Member ID: [id number] Group ID: [id number] Dates of Eligibility: 02/18/23 through 02/17/24  Fund:  CLL  Jinger Neighbors, CPhT-Adv Oncology Pharmacy Patient Advocate [facility] Direct Number: [phone]  Fax: [phone]

## 2023-03-20 NOTE — Telephone Encounter (Signed)
Oral Chemotherapy Pharmacist Encounter  I spoke with patient for overview of: Imbruvica (ibrutinib) for the re-initiation of treatment for CLL, planned duration until disease progression or unacceptable toxicity.   Counseled patient on administration, dosing, side effects, monitoring, drug-food interactions, safe handling, storage, and disposal.  Patient will take Imbruvica 420mg  tablet, 1 tablet (420mg  total) by mouth once daily.  Patient will take Imbruvica at approximately the same time each day with a full glass of water and maintain adequate hydration throughout the day.  Patient knows to avoid grapefruit or grapefruit juice while on therapy with Imbruvica.  Imbruvica re-start date: 03/22/23  Adverse effects include but are not limited to: bruising, decreased blood counts, N/V, diarrhea, musculoskeletal pain, arthralgias, peripheral edema, and hemorrhage.   Diarrhea: Patient will obtain anti diarrheal and alert the office of 4 or more loose stools above baseline.  Reviewed with patient importance of keeping a medication schedule and plan for any missed doses. No barriers to medication adherence identified.  Medication reconciliation performed and medication/allergy list updated.  All questions answered.  Ms. Sayarath voiced understanding and appreciation.   Medication education handout placed in mail for patient. Patient knows to call the office with questions or concerns. Oral Chemotherapy Clinic phone number provided to patient.   Lenord Carbo, PharmD, BCPS, BCOP Hematology/Oncology Clinical Pharmacist [facility] Oral Chemotherapy Navigation Clinics [phone] 03/20/2023 11:12 AM

## 2023-03-20 NOTE — Progress Notes (Signed)
Specialty Pharmacy Initial Fill Coordination Note  Nicole Campbell is a 77 y.o. female contacted today regarding refills of specialty medication(s) Ibrutinib .  Patient requested Delivery  on 03/21/23  to verified address [address]   [place] [address]-   Medication will be filled on 03/20/23.   Patient is aware of $0 copayment.

## 2023-03-21 ENCOUNTER — Ambulatory Visit: Payer: PPO

## 2023-03-23 ENCOUNTER — Encounter: Payer: Self-pay | Admitting: Hematology

## 2023-03-23 LAB — MULTIPLE MYELOMA PANEL, SERUM
Albumin SerPl Elph-Mcnc: 3.6 g/dL (ref 2.9–4.4)
Albumin/Glob SerPl: 1.4 (ref 0.7–1.7)
Alpha 1: 0.3 g/dL (ref 0.0–0.4)
Alpha2 Glob SerPl Elph-Mcnc: 0.9 g/dL (ref 0.4–1.0)
B-Globulin SerPl Elph-Mcnc: 1 g/dL (ref 0.7–1.3)
Gamma Glob SerPl Elph-Mcnc: 0.4 g/dL (ref 0.4–1.8)
Globulin, Total: 2.6 g/dL (ref 2.2–3.9)
IgA: 73 mg/dL (ref 64–422)
IgG (Immunoglobin G), Serum: 512 mg/dL — ABNORMAL LOW (ref 586–1602)
IgM (Immunoglobulin M), Srm: 197 mg/dL (ref 26–217)
Total Protein ELP: 6.2 g/dL (ref 6.0–8.5)

## 2023-03-26 LAB — PTH-RELATED PEPTIDE: PTH-related peptide: <2 pmol/L

## 2023-03-27 DIAGNOSIS — R768 Other specified abnormal immunological findings in serum: Secondary | ICD-10-CM | POA: Diagnosis not present

## 2023-03-27 DIAGNOSIS — C9110 Chronic lymphocytic leukemia of B-cell type not having achieved remission: Secondary | ICD-10-CM | POA: Diagnosis not present

## 2023-03-27 DIAGNOSIS — B029 Zoster without complications: Secondary | ICD-10-CM | POA: Diagnosis not present

## 2023-03-27 DIAGNOSIS — E039 Hypothyroidism, unspecified: Secondary | ICD-10-CM | POA: Diagnosis not present

## 2023-03-27 DIAGNOSIS — Z09 Encounter for follow-up examination after completed treatment for conditions other than malignant neoplasm: Secondary | ICD-10-CM | POA: Diagnosis not present

## 2023-04-05 ENCOUNTER — Other Ambulatory Visit: Payer: Self-pay

## 2023-04-05 ENCOUNTER — Other Ambulatory Visit (HOSPITAL_COMMUNITY): Payer: Self-pay

## 2023-04-05 NOTE — Progress Notes (Signed)
Specialty Pharmacy Ongoing Clinical Assessment Note  Nicole Campbell is a 77 y.o. female who is being followed by the specialty pharmacy service for RxSp Oncology   Patient's specialty medication(s) reviewed today: Ibrutinib   Missed doses in the last 4 weeks: 0   Patient/Caregiver did not have any additional questions or concerns.   Therapeutic benefit summary: Unable to assess   Adverse events/side effects summary: Unable to assess   Patient's therapy is appropriate to: Continue    Goals Addressed             This Visit's Progress    Slow Disease Progression       Patient is unable to be assessed as therapy was recently initiated. Patient will maintain adherence          Follow up:  6 months  Otto Herb Specialty Pharmacist

## 2023-04-05 NOTE — Progress Notes (Signed)
Specialty Pharmacy Refill Coordination Note  Nicole Campbell is a 77 y.o. female contacted today regarding refills of specialty medication(s) Ibrutinib   Patient requested Delivery   Delivery date: 04/17/23   Verified address: [address]   [place] [address]-   Medication will be filled on 04/16/23.

## 2023-04-07 ENCOUNTER — Other Ambulatory Visit: Payer: Self-pay

## 2023-04-07 DIAGNOSIS — C9110 Chronic lymphocytic leukemia of B-cell type not having achieved remission: Secondary | ICD-10-CM

## 2023-04-09 ENCOUNTER — Other Ambulatory Visit: Payer: Self-pay

## 2023-04-09 ENCOUNTER — Inpatient Hospital Stay: Payer: PPO | Attending: Oncology

## 2023-04-09 ENCOUNTER — Inpatient Hospital Stay: Payer: PPO | Admitting: Hematology

## 2023-04-09 VITALS — BP 145/63 | HR 76 | Temp 97.3°F | Resp 20 | Wt 165.5 lb

## 2023-04-09 DIAGNOSIS — M069 Rheumatoid arthritis, unspecified: Secondary | ICD-10-CM | POA: Diagnosis not present

## 2023-04-09 DIAGNOSIS — B0229 Other postherpetic nervous system involvement: Secondary | ICD-10-CM | POA: Diagnosis not present

## 2023-04-09 DIAGNOSIS — C9110 Chronic lymphocytic leukemia of B-cell type not having achieved remission: Secondary | ICD-10-CM

## 2023-04-09 DIAGNOSIS — L309 Dermatitis, unspecified: Secondary | ICD-10-CM | POA: Diagnosis not present

## 2023-04-09 DIAGNOSIS — Z79899 Other long term (current) drug therapy: Secondary | ICD-10-CM | POA: Insufficient documentation

## 2023-04-09 LAB — CBC WITH DIFFERENTIAL (CANCER CENTER ONLY)
Abs Immature Granulocytes: 0.01 10*3/uL (ref 0.00–0.07)
Basophils Absolute: 0.1 10*3/uL (ref 0.0–0.1)
Basophils Relative: 1 %
Eosinophils Absolute: 0.2 10*3/uL (ref 0.0–0.5)
Eosinophils Relative: 4 %
HCT: 34.5 % — ABNORMAL LOW (ref 36.0–46.0)
Hemoglobin: 11.4 g/dL — ABNORMAL LOW (ref 12.0–15.0)
Immature Granulocytes: 0 %
Lymphocytes Relative: 62 %
Lymphs Abs: 3.4 10*3/uL (ref 0.7–4.0)
MCH: 31.2 pg (ref 26.0–34.0)
MCHC: 33 g/dL (ref 30.0–36.0)
MCV: 94.5 fL (ref 80.0–100.0)
Monocytes Absolute: 0.2 10*3/uL (ref 0.1–1.0)
Monocytes Relative: 3 %
Neutro Abs: 1.6 10*3/uL — ABNORMAL LOW (ref 1.7–7.7)
Neutrophils Relative %: 30 %
Platelet Count: 243 10*3/uL (ref 150–400)
RBC: 3.65 MIL/uL — ABNORMAL LOW (ref 3.87–5.11)
RDW: 16.4 % — ABNORMAL HIGH (ref 11.5–15.5)
WBC Count: 5.5 10*3/uL (ref 4.0–10.5)
nRBC: 0 % (ref 0.0–0.2)

## 2023-04-09 LAB — CMP (CANCER CENTER ONLY)
ALT: 13 U/L (ref 0–44)
AST: 13 U/L — ABNORMAL LOW (ref 15–41)
Albumin: 3.9 g/dL (ref 3.5–5.0)
Alkaline Phosphatase: 194 U/L — ABNORMAL HIGH (ref 38–126)
Anion gap: 6 (ref 5–15)
BUN: 14 mg/dL (ref 8–23)
CO2: 25 mmol/L (ref 22–32)
Calcium: 8.7 mg/dL — ABNORMAL LOW (ref 8.9–10.3)
Chloride: 108 mmol/L (ref 98–111)
Creatinine: 0.78 mg/dL (ref 0.44–1.00)
GFR, Estimated: >60 mL/min (ref >60)
Glucose, Bld: 78 mg/dL (ref 70–99)
Potassium: 3.9 mmol/L (ref 3.5–5.1)
Sodium: 139 mmol/L (ref 135–145)
Total Bilirubin: 0.8 mg/dL (ref <1.2)
Total Protein: 6 g/dL — ABNORMAL LOW (ref 6.5–8.1)

## 2023-04-09 LAB — LACTATE DEHYDROGENASE: LDH: 205 U/L — ABNORMAL HIGH (ref 98–192)

## 2023-04-09 NOTE — Progress Notes (Signed)
HEMATOLOGY/ONCOLOGY CLINIC NOTE  Date of Service: 04/09/2023  Patient Care Team: Merri Brunette, MD as PCP - General (Family Medicine) Serena Colonel, MD as Consulting Physician (Otolaryngology)  CHIEF COMPLAINTS/PURPOSE OF CONSULTATION:  CLL (chronic lymphocytic leukemia) Nicole Campbell)  Prior therapy:    She is status post rituximab weekly for 4 weeks completed on August 21, 2018. She is status post thoracentesis completed on August 12, 2019 with positive cytology. Rituximab 375 mg per metered square weekly started on October 13 of 2020 and completed on 03/04/2019.  This will be repeated every 3 months last treatment completed in March 2021. Bendamustine and rituximab started on Sep 03, 2019.  She is here for cycle 3 of therapy.  HISTORY OF PRESENTING ILLNESS:   Nicole Campbell is a wonderful 77 y.o. female who is here for continued evaluation and management of CLL. Patient was following up with Dr. Clelia Croft and has transferried her care to me. She was first diagnosed with CLL in 2019 where she presented with leukocytosis with p53 deletion.   Patient's current treatment is Ibrutinib 420 mg every other day. She was first started on Ibrutinib daily in July 2020, but was switched to Ibrutinib every other day in September 2023 due to presumed dermatological toxicity.  Patient notes that she was getting insect bites which was causing her bruising and scaring in September 2023, which is the reason Dr. Clelia Croft changed her treatment to Ibrutinib to every other day.   INTERVAL HISTORY:  Nicole Campbell is a 77 y.o. female here for continued evaluation and management of chronic lymphocytic leukemia.   Patient was last seen by me on 03/12/2023 and she complained of shingles below her chest area and throughout her back, fatigue, lethargy, appetite loss, body weakness, and left-sided headaches. She was transferred to the ED since labs showed hypercalcemia. 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 She was hypercalcemic at the ED. Patient received prednisone and normal saline in the ED including 20 mg of Lasix and was admitted to the hospital for further evaluation and treatment. She received Zometa infusion, which improved calcium levels.   Patient is accompanied by her husband during this visit. Patient complains of skin rashes on her left and right legs, which she first noticed around 1-2 weeks ago. She has been applying eczema ointment, which has been helping her rashes.   She also complains of itchiness near her back and abdominal at the site of her shingles. She was on Gabapentin 300 mg.  She denies any new infection issus, fever, chills, night sweats, unexpected weight loss, abdominal pain, chest pain, back pain, or leg swelling.   She denies taking Vitamin-D supplement.   Patient currently takes Ibrutinib 420 mg.   MEDICAL HISTORY:  Past Medical History:  Diagnosis Date   CLL (chronic lymphocytic leukemia) (HCC)    Hypertension    Hypothyroid    MVP (mitral valve prolapse)    Rheumatoid arthritis (HCC)     SURGICAL HISTORY: Past Surgical History:  Procedure Laterality Date   ABDOMINAL HYSTERECTOMY     IR THORACENTESIS ASP PLEURAL SPACE W/IMG GUIDE  11/21/2019   IR THORACENTESIS ASP PLEURAL SPACE W/IMG GUIDE  07/05/2020   TONSILLECTOMY      SOCIAL HISTORY: Social History   Socioeconomic History   Marital status: Married    Spouse name: Not on file   Number of children: 3   Years of education: Not on file  Highest education level: Not on file  Occupational History   Not on file  Tobacco Use   Smoking status: Never   Smokeless tobacco: Never  Vaping Use   Vaping status: Never Used  Substance and Sexual Activity   Alcohol use: Yes    Comment: Rare   Drug use: Never   Sexual activity: Not on file  Other Topics Concern   Not on file  Social History Narrative   Not on file   Social Determinants of Health    Financial Resource Strain: Not on file  Food Insecurity: No Food Insecurity (03/16/2023)   Hunger Vital Sign    Worried About Running Out of Food in the Last Year: Never true    Ran Out of Food in the Last Year: Never true  Transportation Needs: No Transportation Needs (03/16/2023)   PRAPARE - Administrator, Civil Service (Medical): No    Lack of Transportation (Non-Medical): No  Physical Activity: Not on file  Stress: Not on file  Social Connections: Not on file  Intimate Partner Violence: Not At Risk (03/16/2023)   Humiliation, Afraid, Rape, and Kick questionnaire    Fear of Current or Ex-Partner: No    Emotionally Abused: No    Physically Abused: No    Sexually Abused: No    FAMILY HISTORY: Family History  Problem Relation Age of Onset   Congestive Heart Failure Father     ALLERGIES:  is allergic to methotrexate and erythromycin.  MEDICATIONS:  Current Outpatient Medications  Medication Sig Dispense Refill   acetaminophen (TYLENOL) 650 MG CR tablet Take 1,300 mg by mouth every 8 (eight) hours as needed for pain.     escitalopram (LEXAPRO) 5 MG tablet Take 5 mg by mouth daily.     ibrutinib (IMBRUVICA) 420 MG tablet Take 1 tablet (420 mg total) by mouth daily. Take with a glass of water. 28 tablet 5   levothyroxine (SYNTHROID, LEVOTHROID) 25 MCG tablet Take 25 mcg by mouth daily before breakfast.      valACYclovir (VALTREX) 500 MG tablet Take 1,000 mg by mouth 3 (three) times daily.     No current facility-administered medications for this visit.    REVIEW OF SYSTEMS:    10 Point review of Systems was done is negative except as noted above.   PHYSICAL EXAMINATION: ECOG PERFORMANCE STATUS: 1 - Symptomatic but completely ambulatory  Vitals:   04/09/23 1450  BP: (!) 145/63  Pulse: 76  Resp: 20  Temp: (!) 97.3 F (36.3 C)  SpO2: 100%   Filed Weights   04/09/23 1450  Weight: 165 lb 8 oz (75.1 kg)   .Body mass index is 26.71  kg/m.   GENERAL:alert, in no acute distress and comfortable SKIN: no acute rashes, no significant lesions EYES: conjunctiva are pink and non-injected, sclera anicteric OROPHARYNX: MMM, no exudates, no oropharyngeal erythema or ulceration NECK: supple, no JVD LYMPH:  no palpable lymphadenopathy in the cervical, axillary or inguinal regions LUNGS: clear to auscultation b/l with normal respiratory effort HEART: regular rate & rhythm ABDOMEN:  normoactive bowel sounds , non tender, not distended. Extremity: no pedal edema PSYCH: alert & oriented x 3 with fluent speech NEURO: no focal motor/sensory deficits   LABORATORY DATA:  I have reviewed the data as listed Component     Latest Ref Rng 03/12/2023  WBC     4.0 - 10.5 K/uL 5.3   RBC     3.87 - 5.11 MIL/uL 4.40   Hemoglobin  12.0 - 15.0 g/dL 09.3   HCT     23.5 - 57.3 % 38.6   MCV     80.0 - 100.0 fL 87.7   MCH     26.0 - 34.0 pg 29.5   MCHC     30.0 - 36.0 g/dL 22.0   RDW     25.4 - 27.0 % 13.0   Platelets     150 - 400 K/uL 196   nRBC     0.0 - 0.2 % 0.0   Neutrophils     % 55   NEUT#     1.7 - 7.7 K/uL 3.0   Lymphocytes     % 35   Lymphs Abs     0.7 - 4.0 K/uL 1.8   Monocytes Relative     % 3   Monocyte #     0.1 - 1.0 K/uL 0.2   Eosinophil     % 6   Eosinophils Absolute     0.0 - 0.5 K/uL 0.3   Basophil     % 0   Basophils Absolute     0.0 - 0.1 K/uL 0.0   Immature Granulocytes     % 1   Abs Immature Granulocytes     0.00 - 0.07 K/uL 0.04   Sodium     135 - 145 mmol/L 135   Potassium     3.5 - 5.1 mmol/L 4.0   Chloride     98 - 111 mmol/L 97 (L)   CO2     22 - 32 mmol/L 34 (H)   Glucose     70 - 99 mg/dL 623 (H)   BUN     8 - 23 mg/dL 21   Creatinine     7.62 - 1.00 mg/dL 8.31 (H)   Calcium     8.9 - 10.3 mg/dL 51.7 (H)   Total Protein     6.5 - 8.1 g/dL 5.9 (L)   Albumin     3.5 - 5.0 g/dL 3.6   AST     15 - 41 U/L 26   ALT     0 - 44 U/L 46 (H)   Alkaline Phosphatase      38 - 126 U/L 100   Total Bilirubin     <1.2 mg/dL 0.5   GFR, Est Non African American     >60 mL/min 52 (L)   Anion gap     5 - 15  4 (L)   Magnesium     1.7 - 2.4 mg/dL     Legend: (L) Low (H) High RADIOGRAPHIC STUDIES: I have personally reviewed the radiological images as listed and agreed with the findings in the report. DG Bone Survey Met  Result Date: 03/16/2023 CLINICAL DATA:  Low back pain, shooting levels, history of CLL EXAM: METASTATIC BONE SURVEY COMPARISON:  02/28/2023 FINDINGS: Standard metastatic bone survey was performed. Lateral skull: No acute or destructive bony abnormalities. Upper extremities: Frontal views of the upper extremities are obtained from the shoulders through the wrists. Punctate lucency within the proximal right humeral metadiaphyseal junction. No other acute or destructive bony abnormalities. Spine: Frontal and lateral views are obtained. There are no acute or destructive bony abnormalities. Mild diffuse spondylosis greatest in the cervical spine and lumbosacral junction. Chest: Frontal view of the chest demonstrates an unremarkable cardiac silhouette. Bilateral pleural thickening and trace right effusion again noted. No airspace disease or pneumothorax. No acute or destructive rib abnormality. Pelvis: Frontal  view of the pelvis includes both hips. No acute or destructive bony abnormalities. Symmetrical bilateral hip osteoarthritis. Lower extremities: Frontal views are obtained from the hips through the ankles bilaterally. There are no acute or destructive bony abnormalities. IMPRESSION: 1. Punctate lucency within the proximal right humeral metadiaphyseal junction, nonspecific the could be related to patient's known history of CLL. 2. No acute bony abnormalities. 3. Mild spondylosis throughout the spine, most pronounced at the lumbosacral junction. This could explain the patient's low back pain. No evidence of acute spine fracture Electronically Signed   By:  Sharlet Salina M.D.   On: 03/16/2023 20:51   DG Abd 2 Views  Result Date: 03/16/2023 CLINICAL DATA:  Constipation EXAM: ABDOMEN - 2 VIEW COMPARISON:  CT of the abdomen 02/28/2023 FINDINGS: Formed stool throughout much of the colon but no rectal impaction or signs of obstruction. No over distension of the colon. Phleboliths are present. No concerning mass effect or gas collection. IMPRESSION: Moderate stool without obstructive pattern or rectal impaction. Electronically Signed   By: Tiburcio Pea M.D.   On: 03/16/2023 07:39   DG Chest 2 View  Result Date: 03/16/2023 CLINICAL DATA:  Chest pain.  Shingles for a week EXAM: CHEST - 2 VIEW COMPARISON:  06/28/2021 FINDINGS: Chronic hyperinflation and costophrenic sulcus blunting from scarring by CT. There is no edema, consolidation, effusion, or pneumothorax. Normal heart size and aortic contours. IMPRESSION: No acute finding when compared to prior. Electronically Signed   By: Tiburcio Pea M.D.   On: 03/16/2023 07:38    ASSESSMENT & PLAN:   77 year old woman with:   1.  CLL presented with lymphocytosis and adenopathy diagnosed in 2019. 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  Rheumatoid arthritis: She remains without any recent exacerbation.  3.  Bullous lesions on her upper and lower extremities: Possibly related to skin reaction associated with Irbutinib or the patient autoimmune disorder (RA)  PLAN: -Discussed lab results from today, 04/09/2023, in detail with the patient. CBC shows slightly low hemoglobin of 11.4 g/dL with hematocrit of 95.6%. CMP shows stable calcium level of 8.7 and elevated Alkaline phosphate level of 194.  -Discussed with the patient that skin rashes looks like eczema. Discussed the option of steroid ointment to see if skin rashes improve. Pt agrees. -Will prescribe steroid ointment.   -Patient has been tolerating her current dose of Ibrutinib 420 mg.  -Take current Ibrutinib 420 mg every other day.  -We will  reduce the Ibrutinib dose from 420 mg to 280 mg.  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 -Recommend to start vitamin-D supplement.   FOLLOW-UP: RTC with Dr Candise Che with labs in 6 weeks  The total time spent in the appointment was 30 minutes* .  All of the patient's questions were answered with apparent satisfaction. The patient knows to call the clinic with any problems, questions or concerns.   Wyvonnia Lora MD MS AAHIVMS [facility] Hematology/Oncology Physician [facility]  .*Total Encounter Time as defined by the Centers for Medicare and Medicaid Services includes, in addition to the face-to-face time of a patient visit (documented in the note above) non-face-to-face time: obtaining and reviewing outside history, ordering and reviewing medications, tests or procedures, care coordination (communications with other health care professionals or caregivers) and documentation in the medical record.   I,Param Shah,acting as a Neurosurgeon for Wyvonnia Lora, MD.,have documented all relevant documentation on the behalf of  Wyvonnia Lora, MD,as directed by  Wyvonnia Lora, MD while in the presence of Wyvonnia Lora, MD.   .I have reviewed the above documentation for accuracy and completeness, and I agree with the above. Johney Maine MD

## 2023-04-11 ENCOUNTER — Telehealth: Payer: Self-pay | Admitting: Hematology

## 2023-04-11 ENCOUNTER — Other Ambulatory Visit (HOSPITAL_COMMUNITY): Payer: Self-pay

## 2023-04-11 ENCOUNTER — Other Ambulatory Visit: Payer: Self-pay

## 2023-04-11 DIAGNOSIS — C9110 Chronic lymphocytic leukemia of B-cell type not having achieved remission: Secondary | ICD-10-CM

## 2023-04-11 MED ORDER — TRIAMCINOLONE ACETONIDE 0.025 % EX OINT: 1.0000 | TOPICAL_OINTMENT | Freq: Two times a day (BID) | CUTANEOUS | 1 refills | Status: AC

## 2023-04-11 MED ORDER — IBRUTINIB 140 MG PO TABS
280.0000 mg | ORAL_TABLET | Freq: Every day | ORAL | 5 refills | Status: DC
  Filled 2023-04-11: qty 56, 28d supply, fill #0
  Filled 2023-05-08: qty 56, 28d supply, fill #1
  Filled 2023-06-01: qty 56, 28d supply, fill #2
  Filled 2023-06-27: qty 56, 28d supply, fill #3
  Filled 2023-07-24: qty 56, 28d supply, fill #4
  Filled 2023-08-23: qty 56, 28d supply, fill #5

## 2023-04-11 MED ORDER — GABAPENTIN 300 MG PO CAPS: 300.0000 mg | ORAL_CAPSULE | Freq: Two times a day (BID) | ORAL | 1 refills | Status: DC

## 2023-04-11 MED ORDER — IBRUTINIB 140 MG PO TABS
280.0000 mg | ORAL_TABLET | Freq: Every day | ORAL | 5 refills | Status: DC
  Filled 2023-04-11: qty 30, 15d supply, fill #0

## 2023-04-11 NOTE — Telephone Encounter (Signed)
Spoke with patient confirming upcoming appointment  

## 2023-04-11 NOTE — Progress Notes (Signed)
Spoke with patient & aware we have to order & Ship 12/12 or 12/13;  There has been a dose decreased. Message has been sent to [facility] to order.

## 2023-04-27 ENCOUNTER — Other Ambulatory Visit: Payer: Self-pay

## 2023-04-27 ENCOUNTER — Encounter (HOSPITAL_BASED_OUTPATIENT_CLINIC_OR_DEPARTMENT_OTHER): Payer: Self-pay | Admitting: Emergency Medicine

## 2023-04-27 ENCOUNTER — Emergency Department (HOSPITAL_BASED_OUTPATIENT_CLINIC_OR_DEPARTMENT_OTHER)
Admission: EM | Admit: 2023-04-27 | Discharge: 2023-04-27 | Disposition: A | Discharge status: Home / Self Care | Payer: PPO | Attending: Emergency Medicine | Admitting: Emergency Medicine

## 2023-04-27 ENCOUNTER — Emergency Department (HOSPITAL_BASED_OUTPATIENT_CLINIC_OR_DEPARTMENT_OTHER): Payer: PPO

## 2023-04-27 DIAGNOSIS — C9110 Chronic lymphocytic leukemia of B-cell type not having achieved remission: Secondary | ICD-10-CM | POA: Diagnosis not present

## 2023-04-27 DIAGNOSIS — R7309 Other abnormal glucose: Secondary | ICD-10-CM | POA: Insufficient documentation

## 2023-04-27 DIAGNOSIS — I10 Essential (primary) hypertension: Secondary | ICD-10-CM | POA: Insufficient documentation

## 2023-04-27 DIAGNOSIS — K8500 Idiopathic acute pancreatitis without necrosis or infection: Secondary | ICD-10-CM | POA: Diagnosis not present

## 2023-04-27 DIAGNOSIS — R739 Hyperglycemia, unspecified: Secondary | ICD-10-CM

## 2023-04-27 DIAGNOSIS — R1013 Epigastric pain: Secondary | ICD-10-CM | POA: Diagnosis not present

## 2023-04-27 DIAGNOSIS — N289 Disorder of kidney and ureter, unspecified: Secondary | ICD-10-CM | POA: Diagnosis not present

## 2023-04-27 DIAGNOSIS — K828 Other specified diseases of gallbladder: Secondary | ICD-10-CM | POA: Diagnosis not present

## 2023-04-27 DIAGNOSIS — R590 Localized enlarged lymph nodes: Secondary | ICD-10-CM | POA: Diagnosis not present

## 2023-04-27 HISTORY — DX: Hypercalcemia: E83.52

## 2023-04-27 HISTORY — DX: Zoster without complications: B02.9

## 2023-04-27 LAB — COMPREHENSIVE METABOLIC PANEL
ALT: 24 U/L (ref 0–44)
AST: 38 U/L (ref 15–41)
Albumin: 3.8 g/dL (ref 3.5–5.0)
Alkaline Phosphatase: 117 U/L (ref 38–126)
Anion gap: 6 (ref 5–15)
BUN: 18 mg/dL (ref 8–23)
CO2: 26 mmol/L (ref 22–32)
Calcium: 8.9 mg/dL (ref 8.9–10.3)
Chloride: 105 mmol/L (ref 98–111)
Creatinine, Ser: 0.88 mg/dL (ref 0.44–1.00)
GFR, Estimated: >60 mL/min (ref >60)
Glucose, Bld: 109 mg/dL — ABNORMAL HIGH (ref 70–99)
Potassium: 3.8 mmol/L (ref 3.5–5.1)
Sodium: 137 mmol/L (ref 135–145)
Total Bilirubin: 0.5 mg/dL (ref <1.2)
Total Protein: 5.9 g/dL — ABNORMAL LOW (ref 6.5–8.1)

## 2023-04-27 LAB — CBC
HCT: 37 % (ref 36.0–46.0)
Hemoglobin: 12.2 g/dL (ref 12.0–15.0)
MCH: 31.1 pg (ref 26.0–34.0)
MCHC: 33 g/dL (ref 30.0–36.0)
MCV: 94.4 fL (ref 80.0–100.0)
Platelets: 166 10*3/uL (ref 150–400)
RBC: 3.92 MIL/uL (ref 3.87–5.11)
RDW: 15.6 % — ABNORMAL HIGH (ref 11.5–15.5)
WBC: 6.6 10*3/uL (ref 4.0–10.5)
nRBC: 0 % (ref 0.0–0.2)

## 2023-04-27 LAB — URINALYSIS, ROUTINE W REFLEX MICROSCOPIC
Bilirubin Urine: NEGATIVE
Glucose, UA: NEGATIVE mg/dL
Ketones, ur: NEGATIVE mg/dL
Nitrite: NEGATIVE
Protein, ur: NEGATIVE mg/dL
Specific Gravity, Urine: 1.015 (ref 1.005–1.030)
pH: 7 (ref 5.0–8.0)

## 2023-04-27 LAB — URINALYSIS, MICROSCOPIC (REFLEX)

## 2023-04-27 LAB — LIPASE, BLOOD: Lipase: 1475 U/L — ABNORMAL HIGH (ref 11–51)

## 2023-04-27 MED ORDER — ONDANSETRON 4 MG PO TBDP
4.0000 mg | ORAL_TABLET | Freq: Once | ORAL | Status: AC | PRN
  Administered 2023-04-27: 4 mg via ORAL
  Filled 2023-04-27: qty 1

## 2023-04-27 MED ORDER — OXYCODONE-ACETAMINOPHEN 5-325 MG PO TABS: 1.0000 | ORAL_TABLET | ORAL | 0 refills | Status: DC | PRN

## 2023-04-27 MED ORDER — IOHEXOL 300 MG/ML  SOLN
100.0000 mL | Freq: Once | INTRAMUSCULAR | Status: AC | PRN
  Administered 2023-04-27: 100 mL via INTRAVENOUS

## 2023-04-27 MED ORDER — ONDANSETRON HCL 4 MG/2ML IJ SOLN
4.0000 mg | Freq: Once | INTRAMUSCULAR | Status: AC
  Administered 2023-04-27: 4 mg via INTRAVENOUS
  Filled 2023-04-27: qty 2

## 2023-04-27 MED ORDER — MORPHINE SULFATE (PF) 4 MG/ML IV SOLN
4.0000 mg | Freq: Once | INTRAVENOUS | Status: AC
  Administered 2023-04-27: 4 mg via INTRAVENOUS
  Filled 2023-04-27: qty 1

## 2023-04-27 MED ORDER — ONDANSETRON 4 MG PO TBDP: 4.0000 mg | ORAL_TABLET | Freq: Three times a day (TID) | ORAL | 0 refills | Status: AC | PRN

## 2023-04-27 NOTE — ED Notes (Signed)
RN provided AVS using Teachback Method. Patient verbalizes understanding of Discharge Instructions. Opportunity for Questioning and Answers were provided by RN. Patient Discharged from ED ambulatory to home with family. ? ?

## 2023-04-27 NOTE — ED Provider Notes (Signed)
[facility] Provider Note   CSN: [id number] Arrival date & time: 04/27/23  0017     History  Chief Complaint  Patient presents with   Abdominal Pain    Nicole Campbell is a 77 y.o. female.  The history is provided by the patient.  Abdominal Pain She has history of hypertension, hyperlipidemia, rheumatoid arthritis, chronic lymphocytic leukemia, hypercalcemia and comes in complaining of pain in her epigastric area and right upper quadrant which started this evening.  Pain does radiate to the back.  There is associated nausea but no vomiting.  She has never had pain like this before.  She denies significant ethanol consumption.  She has been taking gabapentin for shingles.  She denies history of gallstones.   Home Medications Prior to Admission medications   Medication Sig Start Date End Date Taking? Authorizing Provider  acetaminophen (TYLENOL) 650 MG CR tablet Take 1,300 mg by mouth every 8 (eight) hours as needed for pain.    [provider]  escitalopram (LEXAPRO) 5 MG tablet Take 5 mg by mouth daily. 12/15/20   [provider]  gabapentin (NEURONTIN) 300 MG capsule Take 1 capsule (300 mg total) by mouth 2 (two) times daily. Do not drive or operate heavy machinery till you know how this affects you. Can cause some sedation. 04/11/23   Johney Maine, MD  ibrutinib (IMBRUVICA) 140 MG tablet Take 2 tablets (280 mg total) by mouth daily. Take with a glass of water. 04/11/23   Johney Maine, MD  levothyroxine (SYNTHROID, LEVOTHROID) 25 MCG tablet Take 25 mcg by mouth daily before breakfast.  04/30/17   [provider]  triamcinolone (KENALOG) 0.025 % ointment Apply 1 Application topically 2 (two) times daily. Apply to eczematoid rash on lower extremities 04/11/23   Johney Maine, MD  valACYclovir (VALTREX) 500 MG tablet Take 1,000 mg by mouth 3 (three) times daily. 03/07/23   [provider]      Allergies    Methotrexate and Erythromycin    Review of Systems   Review of Systems  Gastrointestinal:  Positive for abdominal pain.  All other systems reviewed and are negative.   Physical Exam Updated Vital Signs BP (!) 172/75 (BP Location: Left Arm)   Pulse 79   Temp 97.6 F (36.4 C)   Resp 16   Ht 5' 6.5" (1.689 m)   Wt 74.8 kg   SpO2 100%   BMI 26.23 kg/m  Physical Exam Vitals and nursing note reviewed.   77 year old female, resting comfortably and in no acute distress. Vital signs are significant for elevated blood pressure. Oxygen saturation is 100%, which is normal. Head is normocephalic and atraumatic. PERRLA, EOMI. Oropharynx is clear. Neck is nontender and supple without adenopathy or JVD. Back is nontender and there is no CVA tenderness. Lungs are clear without rales, wheezes, or rhonchi. Chest is nontender. Heart has regular rate and rhythm without murmur. Abdomen is soft, flat, with moderate epigastric tenderness.  There is no rebound or guarding. Extremities have no cyanosis or edema, full range of motion is present. Skin is warm and dry without rash. Neurologic: Mental status is normal, moves all extremities equally.  ED Results / Procedures / Treatments   Labs (all labs ordered are listed, but only abnormal results are displayed) Labs Reviewed  LIPASE, BLOOD - Abnormal; Notable for the following components:      Result Value   Lipase 1,475 (*)  All other components within normal limits  COMPREHENSIVE METABOLIC PANEL - Abnormal; Notable for the following components:   Glucose, Bld 109 (*)    Total Protein 5.9 (*)    All other components within normal limits  CBC - Abnormal; Notable for the following components:   RDW 15.6 (*)    All other components within normal limits  URINALYSIS, ROUTINE W REFLEX MICROSCOPIC - Abnormal; Notable for the following components:   Hgb urine dipstick SMALL (*)    Leukocytes,Ua TRACE (*)     All other components within normal limits  URINALYSIS, MICROSCOPIC (REFLEX) - Abnormal; Notable for the following components:   Bacteria, UA RARE (*)    All other components within normal limits    EKG EKG Interpretation Date/Time:  Friday April 27 2023 00:39:42 EST Ventricular Rate:  71 PR Interval:  145 QRS Duration:  95 QT Interval:  387 QTC Calculation: 421 R Axis:   62  Text Interpretation: Sinus rhythm Normal ECG When compared with ECG of 03/16/2023, No significant change was found Confirmed by Dione Booze ([id number]) on 04/27/2023 12:44:32 AM  Radiology CT ABDOMEN PELVIS W CONTRAST Result Date: 04/27/2023 CLINICAL DATA:  77 year old female with right upper abdominal pain. Suspected pancreatitis. History of CLL. * Tracking Code: BO * EXAM: CT ABDOMEN AND PELVIS WITH CONTRAST TECHNIQUE: Multidetector CT imaging of the abdomen and pelvis was performed using the standard protocol following bolus administration of intravenous contrast. RADIATION DOSE REDUCTION: This exam was performed according to the departmental dose-optimization program which includes automated exposure control, adjustment of the mA and/or kV according to patient size and/or use of iterative reconstruction technique. CONTRAST:  OMNIPAQUE IOHEXOL 300 MG/ML  SOLN COMPARISON:  CT of the abdomen and pelvis 02/28/2023. FINDINGS: Lower chest: Mild chronic pleural thickening in the visualized lower thorax (left-greater-than-right), unchanged. Hepatobiliary: No suspicious cystic or solid hepatic lesions. No intra or extrahepatic biliary ductal dilatation. Gallbladder is moderately distended. Gallbladder wall appears mildly thickened and edematous. No frank pericholecystic fluid or inflammatory changes are confidently identified. Pancreas: No pancreatic mass. No pancreatic ductal dilatation. Peripancreatic fluid and inflammatory changes. No well organized peripancreatic fluid collection to suggest definite pseudocyst.  Pancreatic parenchyma appears to enhance normally. Spleen: Unremarkable. Adrenals/Urinary Tract: Low-attenuation lesions in both kidneys, compatible with simple cysts, largest of which is in the upper pole of the right kidney measuring 2 cm (no imaging follow-up recommended for these lesions). No aggressive appearing renal lesions. No hydroureteronephrosis. Urinary bladder is unremarkable in appearance. Bilateral adrenal glands are normal in appearance. Stomach/Bowel: The appearance of the stomach is normal. No pathologic dilatation of small bowel or colon. Normal appendix. Vascular/Lymphatic: Aortic atherosclerosis, without evidence of aneurysm or dissection in the abdominal or pelvic vasculature. Multiple prominent but nonenlarged retroperitoneal lymph nodes are noted, decreased in size compared to the prior study, largest of which measures 9 mm in short axis in the left para-aortic nodal station (axial image 24 of series 301). Reproductive: Status post hysterectomy. Ovaries are not confidently identified may be surgically absent or atrophic. Other: No significant volume of ascites.  No pneumoperitoneum. Musculoskeletal: There are no aggressive appearing lytic or blastic lesions noted in the visualized portions of the skeleton. IMPRESSION: 1. Inflammatory changes surrounding the pancreas compatible with reported clinical history of acute pancreatitis. No well organized pancreatic pseudocysts, and no findings of pancreatic necrosis identified at this time. 2. Gallbladder is moderately distended with mild gallbladder wall thickening and edema. No calcified gallstones are noted. Findings are favored to be reactive  in the setting of acute pancreatitis. However, if there is any clinical concern for acute cholecystitis, further evaluation with right upper quadrant abdominal ultrasound could be considered. 3. Decreased size of numerous borderline enlarged retroperitoneal lymph nodes when compared to the recent prior  examination, indicative of a positive response to therapy in this patient with history of CLL. 4. Aortic atherosclerosis. 5. Additional incidental findings, similar to prior study, as above. Electronically Signed   By: Trudie Reed M.D.   On: 04/27/2023 05:11    Procedures Procedures    Medications Ordered in ED Medications  morphine (PF) 4 MG/ML injection 4 mg (has no administration in time range)  ondansetron (ZOFRAN) injection 4 mg (has no administration in time range)  ondansetron (ZOFRAN-ODT) disintegrating tablet 4 mg (4 mg Oral Given 04/27/23 0051)    ED Course/ Medical Decision Making/ A&P                                 Medical Decision Making Amount and/or Complexity of Data Reviewed Labs: ordered. Radiology: ordered.  Risk Prescription drug management.   Epigastric pain radiating to the back.  Differential diagnosis includes, but is not limited to, gastritis, peptic ulcer disease, cholecystitis, pancreatitis, diverticulitis, abdominal aneurysm.  This is a differential which includes conditions with significant risk for rapidity and complications.  I have reviewed her laboratory tests and my interpretation is mild elevation of random glucose which will need to be followed as an outpatient, normal calcium level, markedly elevated lipase consistent with pancreatitis, normal transaminases, alkaline phosphatase and bilirubin, normal CBC.  At this point her pain is consistent with acute pancreatitis, I have ordered a CT of abdomen and pelvis.  She is not as significant consumer of ethanol and has no history of gallstones.  On review of past records, CT of chest abdomen and pelvis on 02/28/2023 showed no evidence of gallstones or any other hepatic abnormality or pancreas abnormality.  I have ordered morphine for pain, ondansetron for nausea.  Of note, gabapentin has been started recently and has rarely been associated with pancreatitis.  CT scan shows changes of pancreatitis,  thickened wall of the gallbladder felt to be reactive without any evidence of cholelithiasis.  She had good relief of pain with morphine.  I am discharging her with prescription for oxycodone-acetaminophen and ondansetron oral dissolving tablet.  Advised to return if pain is not being adequately controlled at home.  Final Clinical Impression(s) / ED Diagnoses Final diagnoses:  Idiopathic acute pancreatitis without infection or necrosis  Elevated random blood glucose level    Rx / DC Orders ED Discharge Orders          Ordered    ondansetron (ZOFRAN-ODT) 4 MG disintegrating tablet  Every 8 hours PRN        04/27/23 0611    oxyCODONE-acetaminophen (PERCOCET) 5-325 MG tablet  Every 4 hours PRN        04/27/23 0611              Dione Booze, MD 04/27/23 [id number]

## 2023-04-27 NOTE — ED Notes (Signed)
MD at the bedside  

## 2023-04-27 NOTE — ED Triage Notes (Signed)
Upper right abdominal pain since 10 pm, is getting over shingles and may be related.

## 2023-04-27 NOTE — Discharge Instructions (Addendum)
Return if pain is not being adequately controlled at home. ?

## 2023-05-08 ENCOUNTER — Other Ambulatory Visit: Payer: Self-pay

## 2023-05-08 NOTE — Progress Notes (Signed)
 Specialty Pharmacy Refill Coordination Note  Nicole Campbell is a 78 y.o. female contacted today regarding refills of specialty medication(s) Ibrutinib  (IMBRUVICA )   Patient requested Delivery   Delivery date: 05/10/23   Verified address: [address]   [place] [address]   Medication will be filled on 05/09/23.

## 2023-05-09 ENCOUNTER — Other Ambulatory Visit: Payer: Self-pay

## 2023-05-18 ENCOUNTER — Other Ambulatory Visit (HOSPITAL_COMMUNITY): Payer: Self-pay

## 2023-05-18 ENCOUNTER — Other Ambulatory Visit: Payer: Self-pay

## 2023-05-18 ENCOUNTER — Telehealth: Payer: Self-pay | Admitting: Pharmacy Technician

## 2023-05-18 NOTE — Telephone Encounter (Signed)
Oral Oncology Patient Advocate Encounter  Received message from [facility] team that patient contacted them regarding a letter she received from [employer] about her Imbruvica.  I called and followed up with the patient. Based on the information the patient provided it sounds like Imbruvica may no longer be the preferred medication for the patient's health plan. I will need to review the letter directly in order to confirm if this is the case and if any action is necessary.  The patient agreed to bring the letter with her to the next appointment she has with Dr. Candise Che on 05/23/23.  Once I have a chance to review the letter I will decide next step to ensure patient has access to her medication.  Jinger Neighbors, CPhT-Adv Oncology Pharmacy Patient Advocate [facility] Direct Number: [phone]  Fax: [phone]'

## 2023-05-23 ENCOUNTER — Inpatient Hospital Stay: Payer: PPO | Attending: Oncology

## 2023-05-23 ENCOUNTER — Inpatient Hospital Stay: Payer: PPO | Admitting: Hematology

## 2023-05-23 VITALS — BP 145/71 | HR 77 | Temp 97.8°F | Resp 18 | Wt 172.8 lb

## 2023-05-23 DIAGNOSIS — B0229 Other postherpetic nervous system involvement: Secondary | ICD-10-CM | POA: Diagnosis not present

## 2023-05-23 DIAGNOSIS — C9110 Chronic lymphocytic leukemia of B-cell type not having achieved remission: Secondary | ICD-10-CM

## 2023-05-23 DIAGNOSIS — L309 Dermatitis, unspecified: Secondary | ICD-10-CM | POA: Diagnosis not present

## 2023-05-23 DIAGNOSIS — Z79899 Other long term (current) drug therapy: Secondary | ICD-10-CM | POA: Insufficient documentation

## 2023-05-23 LAB — CBC WITH DIFFERENTIAL (CANCER CENTER ONLY)
Abs Immature Granulocytes: 0.03 10*3/uL (ref 0.00–0.07)
Basophils Absolute: 0.1 10*3/uL (ref 0.0–0.1)
Basophils Relative: 1 %
Eosinophils Absolute: 0.2 10*3/uL (ref 0.0–0.5)
Eosinophils Relative: 3 %
HCT: 39.6 % (ref 36.0–46.0)
Hemoglobin: 12.9 g/dL (ref 12.0–15.0)
Immature Granulocytes: 0 %
Lymphocytes Relative: 58 %
Lymphs Abs: 3.9 10*3/uL (ref 0.7–4.0)
MCH: 31.4 pg (ref 26.0–34.0)
MCHC: 32.6 g/dL (ref 30.0–36.0)
MCV: 96.4 fL (ref 80.0–100.0)
Monocytes Absolute: 0.3 10*3/uL (ref 0.1–1.0)
Monocytes Relative: 5 %
Neutro Abs: 2.2 10*3/uL (ref 1.7–7.7)
Neutrophils Relative %: 33 %
Platelet Count: 173 10*3/uL (ref 150–400)
RBC: 4.11 MIL/uL (ref 3.87–5.11)
RDW: 14.3 % (ref 11.5–15.5)
WBC Count: 6.7 10*3/uL (ref 4.0–10.5)
nRBC: 0 % (ref 0.0–0.2)

## 2023-05-23 NOTE — Progress Notes (Signed)
HEMATOLOGY/ONCOLOGY CLINIC NOTE  Date of Service: 05/23/2023  Patient Care Team: Merri Brunette, MD as PCP - General (Family Medicine) Serena Colonel, MD as Consulting Physician (Otolaryngology)  CHIEF COMPLAINTS/PURPOSE OF CONSULTATION:  CLL (chronic lymphocytic leukemia) [facility])  Prior therapy:    She is status post rituximab weekly for 4 weeks completed on August 21, 2018. She is status post thoracentesis completed on August 12, 2019 with positive cytology. Rituximab 375 mg per metered square weekly started on October 13 of 2020 and completed on 03/04/2019.  This will be repeated every 3 months last treatment completed in March 2021. Bendamustine and rituximab started on Sep 03, 2019.  She is here for cycle 3 of therapy.  HISTORY OF PRESENTING ILLNESS:   Nicole Campbell is a wonderful 78 y.o. female who is here for continued evaluation and management of CLL. Patient was following up with Dr. Clelia Croft and has transferried her care to me. She was first diagnosed with CLL in 2019 where she presented with leukocytosis with p53 deletion.   Patient's current treatment is Ibrutinib 420 mg every other day. She was first started on Ibrutinib daily in July 2020, but was switched to Ibrutinib every other day in September 2023 due to presumed dermatological toxicity.  Patient notes that she was getting insect bites which was causing her bruising and scaring in September 2023, which is the reason Dr. Clelia Croft changed her treatment to Ibrutinib to every other day.   INTERVAL HISTORY:  Nicole Campbell is a 78 y.o. female here for continued evaluation and management of chronic lymphocytic leukemia.   Patient was last seen by me on 04/09/2023 and complained of skin rashes on her bilateral legs, itchiness near her back and abdomen at site of shingles.   She presented to the ED on 04/27/2023 due to pain in epigastric area and right upper quadrant radiating to the back with associated nausea. CT  abdomen/pelvis scan showed changes of pancreatitis, thickened wall of the gallbladder, which was felt to be reactive without any evidence of cholelithiasis.   Today, she is accompanied by her husband. She reports that her recent pancreatitis infection lasted one night. Patient was prescribed oxycodone-acetaminophen and ondansetron oral dissolving tablet. She has not experienced  any pain or other symptoms since. Patient denies any alcohol use triggering pancreatitis.   She denies any previous history of pancreatitis, though she notes that she did have a similar condition previously.   Patient notes some mild discomfort from her rash at this time, but denies any pain otherwise.  Patient reports that she continues to endorse residual pain from her shingles infection. Patient is currently taking 300 MG Gabapentin twice a day. She reports that her Gabapentin is only very recently starting to improve her symptoms. Patient notes that Gabapentin does improve her sleeping habits.   Patient notes having worsened symptoms 1 week ago involving relatively more severe sensitivity to cloth material in the area of her shingles. Her symptoms have improved over the last couple of days and her discomfort is manageable at this time. She denies any other signs of shingles.   Patient reports that her lower-dose Ibrutinib is more manageable and she denies any toxicity issues. Patient denies starting any other new medications.  She reports mild leg swelling in the right leg.   Patient reports that her rheumatoid rash is well-managed with steroids. She reports that her rash tends to be triggered by seasonal changes.   Patient denies receiving any vaccines recently.  MEDICAL HISTORY:  Past Medical History:  Diagnosis Date   CLL (chronic lymphocytic leukemia) (HCC)    Hypercalcemia    Hypertension    Hypothyroid    MVP (mitral valve prolapse)    Rheumatoid arthritis (HCC)    Shingles     SURGICAL  HISTORY: Past Surgical History:  Procedure Laterality Date   ABDOMINAL HYSTERECTOMY     IR THORACENTESIS ASP PLEURAL SPACE W/IMG GUIDE  11/21/2019   IR THORACENTESIS ASP PLEURAL SPACE W/IMG GUIDE  07/05/2020   TONSILLECTOMY      SOCIAL HISTORY: Social History   Socioeconomic History   Marital status: Married    Spouse name: Not on file   Number of children: 3   Years of education: Not on file   Highest education level: Not on file  Occupational History   Not on file  Tobacco Use   Smoking status: Never   Smokeless tobacco: Never  Vaping Use   Vaping status: Never Used  Substance and Sexual Activity   Alcohol use: Yes    Comment: Rare   Drug use: Never   Sexual activity: Not on file  Other Topics Concern   Not on file  Social History Narrative   Not on file   Social Drivers of Health   Financial Resource Strain: Not on file  Food Insecurity: No Food Insecurity (03/16/2023)   Hunger Vital Sign    Worried About Running Out of Food in the Last Year: Never true    Ran Out of Food in the Last Year: Never true  Transportation Needs: No Transportation Needs (03/16/2023)   PRAPARE - Administrator, Civil Service (Medical): No    Lack of Transportation (Non-Medical): No  Physical Activity: Not on file  Stress: Not on file  Social Connections: Not on file  Intimate Partner Violence: Not At Risk (03/16/2023)   Humiliation, Afraid, Rape, and Kick questionnaire    Fear of Current or Ex-Partner: No    Emotionally Abused: No    Physically Abused: No    Sexually Abused: No    FAMILY HISTORY: Family History  Problem Relation Age of Onset   Congestive Heart Failure Father     ALLERGIES:  is allergic to methotrexate and erythromycin.  MEDICATIONS:  Current Outpatient Medications  Medication Sig Dispense Refill   acetaminophen (TYLENOL) 650 MG CR tablet Take 1,300 mg by mouth every 8 (eight) hours as needed for pain.     escitalopram (LEXAPRO) 5 MG tablet Take  5 mg by mouth daily.     gabapentin (NEURONTIN) 300 MG capsule Take 1 capsule (300 mg total) by mouth 2 (two) times daily. Do not drive or operate heavy machinery till you know how this affects you. Can cause some sedation. 60 capsule 1   ibrutinib (IMBRUVICA) 140 MG tablet Take 2 tablets (280 mg total) by mouth daily. Take with a glass of water. 60 tablet 5   levothyroxine (SYNTHROID, LEVOTHROID) 25 MCG tablet Take 25 mcg by mouth daily before breakfast.      ondansetron (ZOFRAN-ODT) 4 MG disintegrating tablet Take 1 tablet (4 mg total) by mouth every 8 (eight) hours as needed for nausea or vomiting. 20 tablet 0   oxyCODONE-acetaminophen (PERCOCET) 5-325 MG tablet Take 1 tablet by mouth every 4 (four) hours as needed for moderate pain (pain score 4-6). 20 tablet 0   triamcinolone (KENALOG) 0.025 % ointment Apply 1 Application topically 2 (two) times daily. Apply to eczematoid rash on lower extremities 30 g  1   valACYclovir (VALTREX) 500 MG tablet Take 1,000 mg by mouth 3 (three) times daily.     No current facility-administered medications for this visit.    REVIEW OF SYSTEMS:    10 Point review of Systems was done is negative except as noted above.   PHYSICAL EXAMINATION: ECOG PERFORMANCE STATUS: 1 - Symptomatic but completely ambulatory  Vitals:   05/23/23 1217  BP: (!) 145/71  Pulse: 77  Resp: 18  Temp: 97.8 F (36.6 C)  SpO2: 100%    Filed Weights   05/23/23 1217  Weight: 172 lb 12.8 oz (78.4 kg)   .Body mass index is 27.47 kg/m. GENERAL:alert, in no acute distress and comfortable SKIN: no acute rashes, no significant lesions EYES: conjunctiva are pink and non-injected, sclera anicteric OROPHARYNX: MMM, no exudates, no oropharyngeal erythema or ulceration NECK: supple, no JVD LYMPH:  no palpable lymphadenopathy in the cervical, axillary or inguinal regions LUNGS: clear to auscultation b/l with normal respiratory effort HEART: regular rate & rhythm ABDOMEN:  normoactive  bowel sounds , non tender, not distended. Extremity: no pedal edema PSYCH: alert & oriented x 3 with fluent speech NEURO: no focal motor/sensory deficits   LABORATORY DATA:  I have reviewed the data as listed  .    Latest Ref Rng & Units 05/23/2023   11:24 AM 04/27/2023   12:28 AM 04/09/2023    1:49 PM  CBC  WBC 4.0 - 10.5 K/uL 6.7  6.6  5.5   Hemoglobin 12.0 - 15.0 g/dL 95.6  21.3  08.6   Hematocrit 36.0 - 46.0 % 39.6  37.0  34.5   Platelets 150 - 400 K/uL 173  166  243    .    Latest Ref Rng & Units 04/27/2023   12:28 AM 04/09/2023    1:49 PM 03/18/2023    4:50 AM  CMP  Glucose 70 - 99 mg/dL 578  78  469   BUN 8 - 23 mg/dL 18  14  28    Creatinine 0.44 - 1.00 mg/dL 6.29  5.28  4.13   Sodium 135 - 145 mmol/L 137  139  138   Potassium 3.5 - 5.1 mmol/L 3.8  3.9  4.1   Chloride 98 - 111 mmol/L 105  108  106   CO2 22 - 32 mmol/L 26  25  26    Calcium 8.9 - 10.3 mg/dL 8.9  8.7  24.4   Total Protein 6.5 - 8.1 g/dL 5.9  6.0    Total Bilirubin <1.2 mg/dL 0.5  0.8    Alkaline Phos 38 - 126 U/L 117  194    AST 15 - 41 U/L 38  13    ALT 0 - 44 U/L 24  13     . Lab Results  Component Value Date   LDH 205 (H) 04/09/2023    Legend: (L) Low (H) High RADIOGRAPHIC STUDIES: I have personally reviewed the radiological images as listed and agreed with the findings in the report. CT ABDOMEN PELVIS W CONTRAST Result Date: 04/27/2023 CLINICAL DATA:  78 year old female with right upper abdominal pain. Suspected pancreatitis. History of CLL. * Tracking Code: BO * EXAM: CT ABDOMEN AND PELVIS WITH CONTRAST TECHNIQUE: Multidetector CT imaging of the abdomen and pelvis was performed using the standard protocol following bolus administration of intravenous contrast. RADIATION DOSE REDUCTION: This exam was performed according to the departmental dose-optimization program which includes automated exposure control, adjustment of the mA and/or kV according to patient size and/or  use of iterative  reconstruction technique. CONTRAST:  OMNIPAQUE IOHEXOL 300 MG/ML  SOLN COMPARISON:  CT of the abdomen and pelvis 02/28/2023. FINDINGS: Lower chest: Mild chronic pleural thickening in the visualized lower thorax (left-greater-than-right), unchanged. Hepatobiliary: No suspicious cystic or solid hepatic lesions. No intra or extrahepatic biliary ductal dilatation. Gallbladder is moderately distended. Gallbladder wall appears mildly thickened and edematous. No frank pericholecystic fluid or inflammatory changes are confidently identified. Pancreas: No pancreatic mass. No pancreatic ductal dilatation. Peripancreatic fluid and inflammatory changes. No well organized peripancreatic fluid collection to suggest definite pseudocyst. Pancreatic parenchyma appears to enhance normally. Spleen: Unremarkable. Adrenals/Urinary Tract: Low-attenuation lesions in both kidneys, compatible with simple cysts, largest of which is in the upper pole of the right kidney measuring 2 cm (no imaging follow-up recommended for these lesions). No aggressive appearing renal lesions. No hydroureteronephrosis. Urinary bladder is unremarkable in appearance. Bilateral adrenal glands are normal in appearance. Stomach/Bowel: The appearance of the stomach is normal. No pathologic dilatation of small bowel or colon. Normal appendix. Vascular/Lymphatic: Aortic atherosclerosis, without evidence of aneurysm or dissection in the abdominal or pelvic vasculature. Multiple prominent but nonenlarged retroperitoneal lymph nodes are noted, decreased in size compared to the prior study, largest of which measures 9 mm in short axis in the left para-aortic nodal station (axial image 24 of series 301). Reproductive: Status post hysterectomy. Ovaries are not confidently identified may be surgically absent or atrophic. Other: No significant volume of ascites.  No pneumoperitoneum. Musculoskeletal: There are no aggressive appearing lytic or blastic lesions noted in  the visualized portions of the skeleton. IMPRESSION: 1. Inflammatory changes surrounding the pancreas compatible with reported clinical history of acute pancreatitis. No well organized pancreatic pseudocysts, and no findings of pancreatic necrosis identified at this time. 2. Gallbladder is moderately distended with mild gallbladder wall thickening and edema. No calcified gallstones are noted. Findings are favored to be reactive in the setting of acute pancreatitis. However, if there is any clinical concern for acute cholecystitis, further evaluation with right upper quadrant abdominal ultrasound could be considered. 3. Decreased size of numerous borderline enlarged retroperitoneal lymph nodes when compared to the recent prior examination, indicative of a positive response to therapy in this patient with history of CLL. 4. Aortic atherosclerosis. 5. Additional incidental findings, similar to prior study, as above. Electronically Signed   By: Trudie Reed M.D.   On: 04/27/2023 05:11    ASSESSMENT & PLAN:   78 year old woman with:   1.  CLL presented with lymphocytosis and adenopathy diagnosed in 2019. 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  Rheumatoid arthritis: She remains without any recent exacerbation.  3.  Bullous lesions on her upper and lower extremities: Possibly related to skin reaction associated with Irbutinib or the patient autoimmune disorder (RA)- much improved with topical steroids.  PLAN:  -Discussed lab results on 05/23/23 in detail with patient. CBC normal, showed WBC of 6.7K, hemoglobin of 12.9, and platelets of 173K. -hgb improved to 12.9 currently from 12.2 a month ago -CMP shows calcium normalized to 8.9 -Patient has been tolerating her current dose of Ibrutinib 280 mg.  -continue 280 MG Ibrutinib at this time -continue 300 MG gabapentin twice daily for residual pain from shingles  -discussed that after 3 months, there may be a role to adjust dose gabapentin dose if  needed.  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 She shall continue steroids for continued management.  -educated patient that the two most common causes of  pancreatitis are alcohol use and gallstones -educated patient of potential other causes of pancreatitis, including sludge/stone in the past, passing viral infections, triglyceride levels that are too high, and certain autoimmune conditions, including rheumatoid arthritis. -discussed option of his PCP considering a fasting lipid profile with triglycerides to evaluate her triglyceride levels.  -discussed that it would be okay to receive vaccines after 2-3 months -answered all of patient's questions in detail  FOLLOW-UP: RTC with Dr Candise Che with labs in 3 months  The total time spent in the appointment was 30 minutes* .  All of the patient's questions were answered with apparent satisfaction. The patient knows to call the clinic with any problems, questions or concerns.   Wyvonnia Lora MD MS AAHIVMS [facility] Hematology/Oncology Physician [facility]  .*Total Encounter Time as defined by the Centers for Medicare and Medicaid Services includes, in addition to the face-to-face time of a patient visit (documented in the note above) non-face-to-face time: obtaining and reviewing outside history, ordering and reviewing medications, tests or procedures, care coordination (communications with other health care professionals or caregivers) and documentation in the medical record.    I,Mitra Faeizi,acting as a Neurosurgeon for Wyvonnia Lora, MD.,have documented all relevant documentation on the behalf of Wyvonnia Lora, MD,as directed by  Wyvonnia Lora, MD while in the presence of Wyvonnia Lora, MD.  .I have reviewed the above documentation for accuracy and completeness, and I agree with the above. Johney Maine MD

## 2023-05-24 ENCOUNTER — Telehealth: Payer: Self-pay | Admitting: Hematology

## 2023-05-24 ENCOUNTER — Telehealth: Payer: Self-pay | Admitting: Pharmacy Technician

## 2023-05-24 ENCOUNTER — Other Ambulatory Visit (HOSPITAL_COMMUNITY): Payer: Self-pay

## 2023-05-24 NOTE — Telephone Encounter (Signed)
Oral Oncology Patient Advocate Encounter   Received notification that prior authorization for Imbruvica is required.   PA submitted on 05/24/23 [place] [id number] Status is pending     Jinger Neighbors, CPhT-Adv Oncology Pharmacy Patient Advocate [facility] Direct Number: [phone]  Fax: [phone]

## 2023-05-24 NOTE — Telephone Encounter (Signed)
Oral Oncology Patient Advocate Encounter  Letter states Nicole Campbell is no longer formulary. I reviewed the patient's formulary online with [employer] to confirm.   I will submit a formulary exception request through [facility] as patient is stable on this therapy.  Jinger Neighbors, CPhT-Adv Oncology Pharmacy Patient Advocate [facility] Direct Number: [phone]  Fax: [phone]

## 2023-05-24 NOTE — Telephone Encounter (Signed)
Spoke with patient confirming upcoming appointment  

## 2023-05-24 NOTE — Telephone Encounter (Signed)
Oral Oncology Patient Advocate Encounter  Prior Authorization for imbruvica has been approved.    PA# [id number] Effective dates: 05/24/23 through 04/30/24  Patient may continue to fill at [facility].    Jinger Neighbors, CPhT-Adv Oncology Pharmacy Patient Advocate [facility] Direct Number: [phone]  Fax: [phone]

## 2023-06-01 ENCOUNTER — Other Ambulatory Visit: Payer: Self-pay

## 2023-06-01 NOTE — Progress Notes (Signed)
Specialty Pharmacy Refill Coordination Note  Nicole Campbell is a 78 y.o. female contacted today regarding refills of specialty medication(s) Ibrutinib [name])   Patient requested Delivery   Delivery date: 06/08/23   Verified address: [address]   [place] [address]   Medication will be filled on 06/07/23.

## 2023-06-20 ENCOUNTER — Encounter: Payer: Self-pay | Admitting: Hematology

## 2023-06-20 ENCOUNTER — Other Ambulatory Visit: Payer: Self-pay

## 2023-06-20 MED ORDER — GABAPENTIN 300 MG PO CAPS: 300.0000 mg | ORAL_CAPSULE | Freq: Two times a day (BID) | ORAL | 1 refills | Status: DC

## 2023-06-27 ENCOUNTER — Other Ambulatory Visit: Payer: Self-pay

## 2023-06-27 NOTE — Progress Notes (Signed)
 Specialty Pharmacy Ongoing Clinical Assessment Note  Nicole Campbell is a 79 y.o. female who is being followed by the specialty pharmacy service for RxSp Oncology   Patient's specialty medication(s) reviewed today: Ibrutinib (IMBRUVICA)   Missed doses in the last 4 weeks: 0   Patient/Caregiver did not have any additional questions or concerns.   Therapeutic benefit summary: Unable to assess   Adverse events/side effects summary: No adverse events/side effects   Patient's therapy is appropriate to: Continue    Goals Addressed             This Visit's Progress    Stabilization of disease       Patient is on track. Patient will maintain adherence         Follow up:  6 months  Bobette Mo Specialty Pharmacist

## 2023-06-27 NOTE — Progress Notes (Signed)
 Specialty Pharmacy Refill Coordination Note  Nicole Campbell is a 78 y.o. female contacted today regarding refills of specialty medication(s) Ibrutinib [name])   Patient requested Delivery   Delivery date: 07/04/23   Verified address: [address]   [name]   Medication will be filled on 07/03/23.

## 2023-07-03 ENCOUNTER — Other Ambulatory Visit: Payer: Self-pay

## 2023-07-24 ENCOUNTER — Other Ambulatory Visit: Payer: Self-pay

## 2023-07-24 NOTE — Progress Notes (Signed)
 Specialty Pharmacy Refill Coordination Note  Nicole Campbell is a 78 y.o. female contacted today regarding refills of specialty medication(s) Ibrutinib [facility])   Patient requested (Patient-Rptd) Delivery   Delivery date: (Patient-Rptd) 08/01/23   Verified address: (Patient-Rptd) [address] [place], [place] [address]   Medication will be filled on 07/31/23.

## 2023-08-01 ENCOUNTER — Encounter: Payer: Self-pay | Admitting: Hematology

## 2023-08-08 DIAGNOSIS — H0288B Meibomian gland dysfunction left eye, upper and lower eyelids: Secondary | ICD-10-CM | POA: Diagnosis not present

## 2023-08-08 DIAGNOSIS — H04123 Dry eye syndrome of bilateral lacrimal glands: Secondary | ICD-10-CM | POA: Diagnosis not present

## 2023-08-08 DIAGNOSIS — H43813 Vitreous degeneration, bilateral: Secondary | ICD-10-CM | POA: Diagnosis not present

## 2023-08-08 DIAGNOSIS — D3131 Benign neoplasm of right choroid: Secondary | ICD-10-CM | POA: Diagnosis not present

## 2023-08-08 DIAGNOSIS — H35371 Puckering of macula, right eye: Secondary | ICD-10-CM | POA: Diagnosis not present

## 2023-08-08 DIAGNOSIS — H0288A Meibomian gland dysfunction right eye, upper and lower eyelids: Secondary | ICD-10-CM | POA: Diagnosis not present

## 2023-08-08 DIAGNOSIS — Z961 Presence of intraocular lens: Secondary | ICD-10-CM | POA: Diagnosis not present

## 2023-08-21 ENCOUNTER — Other Ambulatory Visit: Payer: Self-pay

## 2023-08-21 DIAGNOSIS — C9110 Chronic lymphocytic leukemia of B-cell type not having achieved remission: Secondary | ICD-10-CM

## 2023-08-22 ENCOUNTER — Inpatient Hospital Stay: Payer: PPO | Admitting: Hematology

## 2023-08-22 ENCOUNTER — Inpatient Hospital Stay: Payer: PPO | Attending: Oncology

## 2023-08-22 VITALS — BP 139/64 | HR 71 | Temp 97.6°F | Resp 19 | Wt 172.4 lb

## 2023-08-22 DIAGNOSIS — C9110 Chronic lymphocytic leukemia of B-cell type not having achieved remission: Secondary | ICD-10-CM

## 2023-08-22 DIAGNOSIS — Z79899 Other long term (current) drug therapy: Secondary | ICD-10-CM | POA: Insufficient documentation

## 2023-08-22 DIAGNOSIS — M069 Rheumatoid arthritis, unspecified: Secondary | ICD-10-CM | POA: Insufficient documentation

## 2023-08-22 LAB — CBC WITH DIFFERENTIAL (CANCER CENTER ONLY)
Abs Immature Granulocytes: 0.07 10*3/uL (ref 0.00–0.07)
Basophils Absolute: 0.1 10*3/uL (ref 0.0–0.1)
Basophils Relative: 0 %
Eosinophils Absolute: 0.2 10*3/uL (ref 0.0–0.5)
Eosinophils Relative: 1 %
HCT: 35.7 % — ABNORMAL LOW (ref 36.0–46.0)
Hemoglobin: 12 g/dL (ref 12.0–15.0)
Immature Granulocytes: 1 %
Lymphocytes Relative: 85 %
Lymphs Abs: 13 10*3/uL — ABNORMAL HIGH (ref 0.7–4.0)
MCH: 31.7 pg (ref 26.0–34.0)
MCHC: 33.6 g/dL (ref 30.0–36.0)
MCV: 94.4 fL (ref 80.0–100.0)
Monocytes Absolute: 0.2 10*3/uL (ref 0.1–1.0)
Monocytes Relative: 1 %
Neutro Abs: 1.9 10*3/uL (ref 1.7–7.7)
Neutrophils Relative %: 12 %
Platelet Count: 193 10*3/uL (ref 150–400)
RBC: 3.78 MIL/uL — ABNORMAL LOW (ref 3.87–5.11)
RDW: 13.4 % (ref 11.5–15.5)
Smear Review: NORMAL
WBC Count: 15.4 10*3/uL — ABNORMAL HIGH (ref 4.0–10.5)
nRBC: 0 % (ref 0.0–0.2)

## 2023-08-22 LAB — CMP (CANCER CENTER ONLY)
ALT: 10 U/L (ref 0–44)
AST: 13 U/L — ABNORMAL LOW (ref 15–41)
Albumin: 4.1 g/dL (ref 3.5–5.0)
Alkaline Phosphatase: 60 U/L (ref 38–126)
Anion gap: 4 — ABNORMAL LOW (ref 5–15)
BUN: 16 mg/dL (ref 8–23)
CO2: 30 mmol/L (ref 22–32)
Calcium: 9.2 mg/dL (ref 8.9–10.3)
Chloride: 107 mmol/L (ref 98–111)
Creatinine: 1.04 mg/dL — ABNORMAL HIGH (ref 0.44–1.00)
GFR, Estimated: 56 mL/min — ABNORMAL LOW (ref >60)
Glucose, Bld: 90 mg/dL (ref 70–99)
Potassium: 4.3 mmol/L (ref 3.5–5.1)
Sodium: 141 mmol/L (ref 135–145)
Total Bilirubin: 0.6 mg/dL (ref 0.0–1.2)
Total Protein: 6 g/dL — ABNORMAL LOW (ref 6.5–8.1)

## 2023-08-22 LAB — LACTATE DEHYDROGENASE: LDH: 165 U/L (ref 98–192)

## 2023-08-22 NOTE — Progress Notes (Signed)
 HEMATOLOGY/ONCOLOGY CLINIC NOTE  Date of Service: 08/22/2023  Patient Care Team: Faustina Hood, MD as PCP - General (Family Medicine) Janita Mellow, MD as Consulting Physician (Otolaryngology)  CHIEF COMPLAINTS/PURPOSE OF CONSULTATION:  CLL (chronic lymphocytic leukemia) [facility])  Prior therapy:    She is status post rituximab  weekly for 4 weeks completed on August 21, 2018. She is status post thoracentesis completed on August 12, 2019 with positive cytology. Rituximab  375 mg per metered square weekly started on October 13 of 2020 and completed on 03/04/2019.  This will be repeated every 3 months last treatment completed in March 2021. Bendamustine  and rituximab  started on Sep 03, 2019.  She is here for cycle 3 of therapy.  HISTORY OF PRESENTING ILLNESS:   Nicole Campbell is a wonderful 78 y.o. female who is here for continued evaluation and management of CLL. Patient was following up with Dr. Dirk Fredericks and has transferried her care to me. She was first diagnosed with CLL in 2019 where she presented with leukocytosis with p53 deletion.   Patient's current treatment is Ibrutinib  420 mg every other day. She was first started on Ibrutinib  daily in July 2020, but was switched to Ibrutinib  every other day in September 2023 due to presumed dermatological toxicity.  Patient notes that she was getting insect bites which was causing her bruising and scaring in September 2023, which is the reason Dr. Dirk Fredericks changed her treatment to Ibrutinib  to every other day.   INTERVAL HISTORY:  Nicole Campbell is a 78 y.o. female here for continued evaluation and management of chronic lymphocytic leukemia.   Patient was last seen by me on 05/23/2023 and reported having a pancreatitis infection lasting one night, residual pain from her shingles infection, mild discomfort from her rheumatoid rash, and mild leg swelling in the right leg.   Today, she is accompanied by her husband. Patient reports that her  Postherpetic neuralgia continues to be present. Her discomfort is manageable at this time. Her discomfort is mostly due to sensitivity and she denies pain. Patient gradually weaned off of gabapentin  due to adverse effect of tiredness.   She reports that she endorsed random sharp pain 1 week ago. She notes that she was off of gabapentin  at that time. Patient took Acetaminophen  which resolved her sharp pain and she denies any recurrence.   Patient denies any recurrence of shingles since November 2024. She is not needing Valtrex  at this time. Patient denies any skin rashes or blisters.   She does generally wear sun protection while spending time out on her porch.   She tolerating 280 MG Ibrutinib  with no new or major toxicity issues.  Her rheumatoid arthritis has been generally controlled since being on Imbruvica .   She denies any recent infections and notes that she has not been on steroids recently.   Patient was admitted on 04/27/2023 for idiopathic acute pancreatitis. She denies having any related abdominal pain at this time. She denies any new lumps/bumps, back pan, or abdominal pain.   Patient reports that she generally did not endorse seasonal allergies previously, but does report having mild allergy symptoms this year.   Patient reports occasional episodes of increased heart rate. She reports that there is a "domino effect" in which her skipped heart beats cause her to become nervous and further increases her heart rate. She reports an incident of irregular heart beat on April 13 with fluctuating pulse 87-110 bpm. Patient does manage these symptoms and she denies having any episodes since being  on lexapro . Her BP in clinic today is 139/64 with pulse of 71 bpm.   Patient plans to travel to [facility] in mid-May.   MEDICAL HISTORY:  Past Medical History:  Diagnosis Date   CLL (chronic lymphocytic leukemia) (HCC)    Hypercalcemia    Hypertension    Hypothyroid    MVP (mitral valve  prolapse)    Rheumatoid arthritis (HCC)    Shingles     SURGICAL HISTORY: Past Surgical History:  Procedure Laterality Date   ABDOMINAL HYSTERECTOMY     IR THORACENTESIS ASP PLEURAL SPACE W/IMG GUIDE  11/21/2019   IR THORACENTESIS ASP PLEURAL SPACE W/IMG GUIDE  07/05/2020   TONSILLECTOMY      SOCIAL HISTORY: Social History   Socioeconomic History   Marital status: Married    Spouse name: Not on file   Number of children: 3   Years of education: Not on file   Highest education level: Not on file  Occupational History   Not on file  Tobacco Use   Smoking status: Never   Smokeless tobacco: Never  Vaping Use   Vaping status: Never Used  Substance and Sexual Activity   Alcohol use: Yes    Comment: Rare   Drug use: Never   Sexual activity: Not on file  Other Topics Concern   Not on file  Social History Narrative   Not on file   Social Drivers of Health   Financial Resource Strain: Not on file  Food Insecurity: No Food Insecurity (03/16/2023)   Hunger Vital Sign    Worried About Running Out of Food in the Last Year: Never true    Ran Out of Food in the Last Year: Never true  Transportation Needs: No Transportation Needs (03/16/2023)   PRAPARE - Administrator, Civil Service (Medical): No    Lack of Transportation (Non-Medical): No  Physical Activity: Not on file  Stress: Not on file  Social Connections: Not on file  Intimate Partner Violence: Not At Risk (03/16/2023)   Humiliation, Afraid, Rape, and Kick questionnaire    Fear of Current or Ex-Partner: No    Emotionally Abused: No    Physically Abused: No    Sexually Abused: No    FAMILY HISTORY: Family History  Problem Relation Age of Onset   Congestive Heart Failure Father     ALLERGIES:  is allergic to methotrexate and erythromycin.  MEDICATIONS:  Current Outpatient Medications  Medication Sig Dispense Refill   acetaminophen  (TYLENOL ) 650 MG CR tablet Take 1,300 mg by mouth every 8 (eight)  hours as needed for pain.     escitalopram  (LEXAPRO ) 5 MG tablet Take 5 mg by mouth daily.     gabapentin  (NEURONTIN ) 300 MG capsule Take 1 capsule (300 mg total) by mouth 2 (two) times daily. Do not drive or operate heavy machinery till you know how this affects you. Can cause some sedation. 60 capsule 1   ibrutinib  (IMBRUVICA ) 140 MG tablet Take 2 tablets (280 mg total) by mouth daily. Take with a glass of water. 60 tablet 5   levothyroxine  (SYNTHROID , LEVOTHROID) 25 MCG tablet Take 25 mcg by mouth daily before breakfast.      ondansetron  (ZOFRAN -ODT) 4 MG disintegrating tablet Take 1 tablet (4 mg total) by mouth every 8 (eight) hours as needed for nausea or vomiting. 20 tablet 0   oxyCODONE -acetaminophen  (PERCOCET) 5-325 MG tablet Take 1 tablet by mouth every 4 (four) hours as needed for moderate pain (pain score 4-6).  20 tablet 0   triamcinolone  (KENALOG ) 0.025 % ointment Apply 1 Application topically 2 (two) times daily. Apply to eczematoid rash on lower extremities 30 g 1   valACYclovir  (VALTREX ) 500 MG tablet Take 1,000 mg by mouth 3 (three) times daily.     No current facility-administered medications for this visit.    REVIEW OF SYSTEMS:    10 Point review of Systems was done is negative except as noted above.   PHYSICAL EXAMINATION: ECOG PERFORMANCE STATUS: 1 - Symptomatic but completely ambulatory  Vitals:   08/22/23 1032  BP: 139/64  Pulse: 71  Resp: 19  Temp: 97.6 F (36.4 C)  SpO2: 98%   Filed Weights   08/22/23 1032  Weight: 172 lb 6.4 oz (78.2 kg)   .Body mass index is 27.41 kg/m. GENERAL:alert, in no acute distress and comfortable SKIN: no acute rashes, no significant lesions EYES: conjunctiva are pink and non-injected, sclera anicteric OROPHARYNX: MMM, no exudates, no oropharyngeal erythema or ulceration NECK: supple, no JVD LYMPH:  no palpable lymphadenopathy in the cervical, axillary or inguinal regions LUNGS: clear to auscultation b/l with normal  respiratory effort HEART: regular rate & rhythm ABDOMEN:  normoactive bowel sounds , non tender, not distended. Extremity: no pedal edema PSYCH: alert & oriented x 3 with fluent speech NEURO: no focal motor/sensory deficits   LABORATORY DATA:  I have reviewed the data as listed  .    Latest Ref Rng & Units 08/22/2023   10:18 AM 05/23/2023   11:24 AM 04/27/2023   12:28 AM  CBC  WBC 4.0 - 10.5 K/uL 15.4  6.7  6.6   Hemoglobin 12.0 - 15.0 g/dL 84.1  32.4  40.1   Hematocrit 36.0 - 46.0 % 35.7  39.6  37.0   Platelets 150 - 400 K/uL 193  173  166    .    Latest Ref Rng & Units 08/22/2023   10:18 AM 04/27/2023   12:28 AM 04/09/2023    1:49 PM  CMP  Glucose 70 - 99 mg/dL 90  027  78   BUN 8 - 23 mg/dL 16  18  14    Creatinine 0.44 - 1.00 mg/dL 2.53  6.64  4.03   Sodium 135 - 145 mmol/L 141  137  139   Potassium 3.5 - 5.1 mmol/L 4.3  3.8  3.9   Chloride 98 - 111 mmol/L 107  105  108   CO2 22 - 32 mmol/L 30  26  25    Calcium 8.9 - 10.3 mg/dL 9.2  8.9  8.7   Total Protein 6.5 - 8.1 g/dL 6.0  5.9  6.0   Total Bilirubin 0.0 - 1.2 mg/dL 0.6  0.5  0.8   Alkaline Phos 38 - 126 U/L 60  117  194   AST 15 - 41 U/L 13  38  13   ALT 0 - 44 U/L 10  24  13     . Lab Results  Component Value Date   LDH 165 08/22/2023    Legend: (L) Low (H) High RADIOGRAPHIC STUDIES: I have personally reviewed the radiological images as listed and agreed with the findings in the report. No results found.   ASSESSMENT & PLAN:   78 year old woman with:   1.  CLL presented with lymphocytosis and adenopathy diagnosed in 2019. 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  Rheumatoid arthritis: She remains without any recent exacerbation.  3.  Bullous lesions on her upper and lower extremities: Possibly  related to skin reaction associated with Irbutinib or the patient autoimmune disorder (RA)- much improved with topical steroids.  PLAN:  -Discussed lab results on 08/22/23 in detail with patient. CBC  showed WBC of 15.4K, hemoglobin of 12.0, and platelets of 193K. -differentials off CBC are pending at time of clinical visit -will continiue to montior elevated WBCs. Discussed that if her elevated WBCs are persistent or higher, there would be a role to discuss consideration of full-dose ibrutinib  if needed -did not feel any enlarged lymph nodes or enlarged spleen during physical examination -Patient has been tolerating her current dose of Ibrutinib  280 mg.  -continue 280 MG Ibrutinib  at this time -patient has weaned herself off of gabapentin  for postherpetic neuralgia -discussed that if there is no sharp or worsened pain while off of gabapentin , there may not be a role for additional treatment -educated patient that generally, if pain is persistent after at least 3 months of gabapentin  for chronic postherpetic neuralgia, there may be a role for nerve ablation  -okay to take OTC Acetaminophen  -it is okay to receive the shingles vaccine -educated patient that multiple factors, including CLL, rheumatoid arthritis,and certain medications can cause increased sun sensitivity -educated patient that even in the shade, there may be reflective sunlight, and sun protection would be recommended -answered all of patient's questions in detail  FOLLOW-UP: RTC with Dr Salomon Cree with labs in 2 months  The total time spent in the appointment was 30 minutes* .  All of the patient's questions were answered with apparent satisfaction. The patient knows to call the clinic with any problems, questions or concerns.   Jacquelyn Matt MD MS AAHIVMS [facility] Hematology/Oncology Physician [facility]  .*Total Encounter Time as defined by the Centers for Medicare and Medicaid Services includes, in addition to the face-to-face time of a patient visit (documented in the note above) non-face-to-face time: obtaining and reviewing outside history, ordering and reviewing medications, tests or procedures, care  coordination (communications with other health care professionals or caregivers) and documentation in the medical record.    I,Mitra Faeizi,acting as a Neurosurgeon for Jacquelyn Matt, MD.,have documented all relevant documentation on the behalf of Jacquelyn Matt, MD,as directed by  Jacquelyn Matt, MD while in the presence of Jacquelyn Matt, MD.  .I have reviewed the above documentation for accuracy and completeness, and I agree with the above. .Crystale Giannattasio Kishore Dhruva Orndoff MD

## 2023-08-23 ENCOUNTER — Other Ambulatory Visit: Payer: Self-pay | Admitting: Pharmacy Technician

## 2023-08-23 ENCOUNTER — Other Ambulatory Visit: Payer: Self-pay

## 2023-08-23 NOTE — Progress Notes (Signed)
 Specialty Pharmacy Refill Coordination Note  Nicole Campbell is a 78 y.o. female contacted today regarding refills of specialty medication(s) Ibrutinib  (IMBRUVICA )   Patient requested (Patient-Rptd) Delivery   Delivery date: (Patient-Rptd) 08/31/23   Verified address: (Patient-Rptd) [address] [place], [place] [address]   Medication will be filled on 08/30/23.

## 2023-08-30 ENCOUNTER — Other Ambulatory Visit: Payer: Self-pay

## 2023-09-04 ENCOUNTER — Telehealth: Payer: Self-pay | Admitting: Hematology

## 2023-09-04 NOTE — Telephone Encounter (Signed)
 Spoke with patient confirming upcoming appointment

## 2023-09-19 ENCOUNTER — Other Ambulatory Visit: Payer: Self-pay

## 2023-09-20 ENCOUNTER — Other Ambulatory Visit: Payer: Self-pay | Admitting: Hematology

## 2023-09-20 ENCOUNTER — Other Ambulatory Visit: Payer: Self-pay

## 2023-09-20 DIAGNOSIS — C9110 Chronic lymphocytic leukemia of B-cell type not having achieved remission: Secondary | ICD-10-CM

## 2023-09-20 MED ORDER — IBRUTINIB 140 MG PO TABS
280.0000 mg | ORAL_TABLET | Freq: Every day | ORAL | 5 refills | Status: DC
Start: 2023-09-20 — End: 2024-01-08
  Filled 2023-09-20: qty 56, 28d supply, fill #0
  Filled 2023-10-18: qty 56, 28d supply, fill #1
  Filled 2023-11-12: qty 56, 28d supply, fill #2
  Filled 2023-12-05: qty 56, 28d supply, fill #3

## 2023-09-20 NOTE — Progress Notes (Signed)
 Specialty Pharmacy Refill Coordination Note  Nicole Campbell is a 78 y.o. female contacted today regarding refills of specialty medication(s) Ibrutinib  (IMBRUVICA )   Patient requested Delivery   Delivery date: 09/27/23   Verified address: [address], [place], [place] [address]   Medication will be filled on 09/26/23. This fill date is pending response to refill request from provider. Patient is aware and if they have not received fill by intended date they must follow up with pharmacy.

## 2023-09-20 NOTE — Progress Notes (Signed)
 Specialty Pharmacy Ongoing Clinical Assessment Note  Nicole Campbell is a 78 y.o. female who is being followed by the specialty pharmacy service for RxSp Oncology   Patient's specialty medication(s) reviewed today: Ibrutinib  (IMBRUVICA )   Missed doses in the last 4 weeks: 0   Patient/Caregiver did not have any additional questions or concerns.   Therapeutic benefit summary: Patient is achieving benefit   Adverse events/side effects summary: No adverse events/side effects   Patient's therapy is appropriate to: Continue    Goals Addressed             This Visit's Progress    Slow Disease Progression   No change    Patient is not on track and no change. Patient will maintain adherence and be monitored by provider to determine if a change in treatment plan is warranted. Per office visit on 08/22/23, Dr. Salomon Cree is monitoring and noticed increased in WBCs. Pt is on subclinical dosing currently, Dr. Salomon Cree will re-evaluate and decide if a dose adjustment is warranted.          Follow up: 3 months  [facility]

## 2023-09-25 ENCOUNTER — Other Ambulatory Visit: Payer: Self-pay | Admitting: Family Medicine

## 2023-09-25 DIAGNOSIS — R1900 Intra-abdominal and pelvic swelling, mass and lump, unspecified site: Secondary | ICD-10-CM | POA: Diagnosis not present

## 2023-09-28 ENCOUNTER — Ambulatory Visit
Admission: RE | Admit: 2023-09-28 | Discharge: 2023-09-28 | Disposition: A | Discharge status: Home / Self Care | Source: Ambulatory Visit | Attending: Family Medicine | Admitting: Family Medicine

## 2023-09-28 DIAGNOSIS — R1900 Intra-abdominal and pelvic swelling, mass and lump, unspecified site: Secondary | ICD-10-CM

## 2023-09-28 DIAGNOSIS — R2241 Localized swelling, mass and lump, right lower limb: Secondary | ICD-10-CM | POA: Diagnosis not present

## 2023-09-28 DIAGNOSIS — Z856 Personal history of leukemia: Secondary | ICD-10-CM | POA: Diagnosis not present

## 2023-10-03 ENCOUNTER — Encounter: Payer: Self-pay | Admitting: Family Medicine

## 2023-10-04 ENCOUNTER — Other Ambulatory Visit: Payer: Self-pay | Admitting: Family Medicine

## 2023-10-04 DIAGNOSIS — R1900 Intra-abdominal and pelvic swelling, mass and lump, unspecified site: Secondary | ICD-10-CM

## 2023-10-15 ENCOUNTER — Ambulatory Visit
Admission: RE | Admit: 2023-10-15 | Discharge: 2023-10-15 | Disposition: A | Discharge status: Home / Self Care | Source: Ambulatory Visit | Attending: Family Medicine | Admitting: Family Medicine

## 2023-10-15 DIAGNOSIS — R590 Localized enlarged lymph nodes: Secondary | ICD-10-CM | POA: Diagnosis not present

## 2023-10-15 DIAGNOSIS — M7989 Other specified soft tissue disorders: Secondary | ICD-10-CM | POA: Diagnosis not present

## 2023-10-15 DIAGNOSIS — R1900 Intra-abdominal and pelvic swelling, mass and lump, unspecified site: Secondary | ICD-10-CM

## 2023-10-15 MED ORDER — IOPAMIDOL (ISOVUE-300) INJECTION 61%
75.0000 mL | Freq: Once | INTRAVENOUS | Status: AC | PRN
  Administered 2023-10-15: 75 mL via INTRAVENOUS

## 2023-10-18 ENCOUNTER — Encounter (INDEPENDENT_AMBULATORY_CARE_PROVIDER_SITE_OTHER): Payer: Self-pay

## 2023-10-18 ENCOUNTER — Other Ambulatory Visit: Payer: Self-pay

## 2023-10-18 NOTE — Progress Notes (Signed)
 Specialty Pharmacy Refill Coordination Note  Nicole Campbell is a 78 y.o. female contacted today regarding refills of specialty medication(s) Ibrutinib  (IMBRUVICA )   Patient requested (Patient-Rptd) Delivery   Delivery date: 10/22/23   Verified address: (Patient-Rptd) [address] [place], [place] [address]   Medication will be filled on 10/19/23.

## 2023-10-19 ENCOUNTER — Other Ambulatory Visit (HOSPITAL_COMMUNITY): Payer: Self-pay

## 2023-10-23 ENCOUNTER — Encounter: Payer: Self-pay | Admitting: Hematology

## 2023-10-25 ENCOUNTER — Telehealth: Payer: Self-pay

## 2023-10-25 NOTE — Telephone Encounter (Signed)
 Contacted pt regarding MyChart message. Per Dr Onesimo he will discuss scan with pt at her upcoming appointment. Pt acknowledged information and verbalized understanding.

## 2023-11-09 ENCOUNTER — Encounter (INDEPENDENT_AMBULATORY_CARE_PROVIDER_SITE_OTHER): Payer: Self-pay

## 2023-11-09 ENCOUNTER — Other Ambulatory Visit: Payer: Self-pay

## 2023-11-12 ENCOUNTER — Other Ambulatory Visit: Payer: Self-pay

## 2023-11-12 NOTE — Progress Notes (Signed)
 Specialty Pharmacy Refill Coordination Note  Nicole Campbell is a 78 y.o. female contacted today regarding refills of specialty medication(s) Ibrutinib  (IMBRUVICA )   Patient requested Delivery   Delivery date: 11/14/23   Verified address: [address] [place], [place] [address]   Medication will be filled on 11/13/23.

## 2023-11-13 ENCOUNTER — Other Ambulatory Visit (HOSPITAL_COMMUNITY): Payer: Self-pay

## 2023-11-19 ENCOUNTER — Other Ambulatory Visit: Payer: Self-pay

## 2023-11-19 DIAGNOSIS — C9110 Chronic lymphocytic leukemia of B-cell type not having achieved remission: Secondary | ICD-10-CM

## 2023-11-19 NOTE — Progress Notes (Signed)
 HEMATOLOGY/ONCOLOGY CLINIC NOTE  Date of Service: 11/20/2023  Patient Care Team: Claudene Pellet, MD as PCP - General (Family Medicine) Jesus Oliphant, MD as Consulting Physician (Otolaryngology)  CHIEF COMPLAINTS/PURPOSE OF CONSULTATION:  CLL (chronic lymphocytic leukemia) [facility])  Prior therapy:    She is status post rituximab  weekly for 4 weeks completed on August 21, 2018. She is status post thoracentesis completed on August 12, 2019 with positive cytology. Rituximab  375 mg per metered square weekly started on October 13 of 2020 and completed on 03/04/2019.  This will be repeated every 3 months last treatment completed in March 2021. Bendamustine  and rituximab  started on Sep 03, 2019.  She is here for cycle 3 of therapy.  HISTORY OF PRESENTING ILLNESS:   Nicole Campbell is a wonderful 78 y.o. female who is here for continued evaluation and management of CLL. Patient was following up with Dr. Amadeo and has transferried her care to me. She was first diagnosed with CLL in 2019 where she presented with leukocytosis with p53 deletion.   Patient's current treatment is Ibrutinib  420 mg every other day. She was first started on Ibrutinib  daily in July 2020, but was switched to Ibrutinib  every other day in September 2023 due to presumed dermatological toxicity.  Patient notes that she was getting insect bites which was causing her bruising and scaring in September 2023, which is the reason Dr. Amadeo changed her treatment to Ibrutinib  to every other day.   INTERVAL HISTORY:  Nicole Campbell is a 78 y.o. female here for continued evaluation and management of chronic lymphocytic leukemia.   Patient was last seen by me on 08/22/2023 and reported manageable sensitivity symptoms from Postherpetic neuralgia and noted an incident of random sharp pain. Patient also complained of mild allergy symptoms and occasional episodes of increased heart rate with nervousness.  Patient is accompanied by her  husband during today's visit. She reports that she has been doing fairly well since her last clinical visit.   Patient reports having a small bulla in her right arm as well as new bullae in the bilateral lower extremities. She denies any bulla inside the mouth. Patient denies any bulla outside of her legs and right arm. She notes that her bullae are only present in the summer and never in the winter.   She notes that she had two biopsies. Patient reports being diagnosed with two different diagnoses, one of which being Spongiform dermatitis and the other being Bullous pemphigoid. 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   She complains of cramping in her fingers sometimes. Patient notes that if she grips too tight, her hands lock up periodically.   Patient noted to have right abdm soft tissue mass which has grown in size to 8 cm in the area of her right flank. She notes that her mass has been present for a while previously and has grown fairly quickly. Patient reports that she initially noticed her mass was larger than normal in [place] while [place] the beach where she turned on her right side and felt pain. She was then seen by her PCP, Claudene Pellet, MD, who ordered US  and CT scan. She reports that she received CT abdomen/pelvis scan ordered by Dr. Claudene. Findings showed new 8 cm right lateral abdominal wall soft tissue mass. Her mass was not really read on previous CT scan from December 2024. She denies any other enlarged lymph nodes.  Patient reports sense of fullness with her right flank mass and notes that her mass twinges occasionally. She generally endorses an unusual sensation with her abdominal mass. Patient denies any pain necessarily from her mass, but sometimes has discomfort when lying down on her right side.   Her RA is stable at this time. Patient reports that she is not on any medications managed by  her rheumatologist at this time. She notes that her rheumatologisit is with [facility] but is unsure of the doctor she is seeing. Patient notes that she was previously seen by Rocky Pereyra PA-C.   Patient is noted to be on Ibuprofen, Lexapro  and thyroid  medication.   She denies any unexplained fevers, chills, night sweats, abdominal pain, leg swelling, diarrhea, or issues passing urine.   Patient reports that she has been trying to increase her water intake recently. She notes that increased hydration did improve her leg swelling.   MEDICAL HISTORY:  Past Medical History:  Diagnosis Date   CLL (chronic lymphocytic leukemia) (HCC)    Hypercalcemia    Hypertension    Hypothyroid    MVP (mitral valve prolapse)    Rheumatoid arthritis (HCC)    Shingles     SURGICAL HISTORY: Past Surgical History:  Procedure Laterality Date   ABDOMINAL HYSTERECTOMY     IR THORACENTESIS ASP PLEURAL SPACE W/IMG GUIDE  11/21/2019   IR THORACENTESIS ASP PLEURAL SPACE W/IMG GUIDE  07/05/2020   TONSILLECTOMY      SOCIAL HISTORY: Social History   Socioeconomic History   Marital status: Married    Spouse name: Not on file   Number of children: 3   Years of education: Not on file   Highest education level: Not on file  Occupational History   Not on file  Tobacco Use   Smoking status: Never   Smokeless tobacco: Never  Vaping Use   Vaping status: Never Used  Substance and Sexual Activity   Alcohol use: Yes    Comment: Rare   Drug use: Never   Sexual activity: Not on file  Other Topics Concern   Not on file  Social History Narrative   Not on file   Social Drivers of Health   Financial Resource Strain: Not on file  Food Insecurity: No Food Insecurity (03/16/2023)   Hunger Vital Sign    Worried About Running Out of Food in the Last Year: Never true    Ran Out of Food in the Last Year: Never true  Transportation Needs: No Transportation Needs (03/16/2023)   PRAPARE - Therapist, art (Medical): No    Lack of Transportation (Non-Medical): No  Physical Activity: Not on file  Stress: Not on file  Social Connections: Not on file  Intimate Partner Violence: Not At Risk (03/16/2023)   Humiliation, Afraid, Rape, and Kick questionnaire    Fear of Current or Ex-Partner: No    Emotionally Abused: No    Physically Abused: No    Sexually Abused: No    FAMILY HISTORY: Family History  Problem Relation Age of Onset   Congestive Heart Failure Father     ALLERGIES:  is allergic to methotrexate and erythromycin.  MEDICATIONS:  Current Outpatient Medications  Medication Sig Dispense Refill   acetaminophen  (TYLENOL ) 650 MG CR tablet Take 1,300 mg by mouth every 8 (eight) hours as needed for pain.     escitalopram  (LEXAPRO ) 5 MG tablet Take 5 mg by mouth daily.     gabapentin  (NEURONTIN ) 300  MG capsule Take 1 capsule (300 mg total) by mouth 2 (two) times daily. Do not drive or operate heavy machinery till you know how this affects you. Can cause some sedation. 60 capsule 1   ibrutinib  (IMBRUVICA ) 140 MG tablet Take 2 tablets (280 mg total) by mouth daily. Take with a glass of water. 60 tablet 5   levothyroxine  (SYNTHROID , LEVOTHROID) 25 MCG tablet Take 25 mcg by mouth daily before breakfast.      ondansetron  (ZOFRAN -ODT) 4 MG disintegrating tablet Take 1 tablet (4 mg total) by mouth every 8 (eight) hours as needed for nausea or vomiting. 20 tablet 0   oxyCODONE -acetaminophen  (PERCOCET) 5-325 MG tablet Take 1 tablet by mouth every 4 (four) hours as needed for moderate pain (pain score 4-6). 20 tablet 0   triamcinolone  (KENALOG ) 0.025 % ointment Apply 1 Application topically 2 (two) times daily. Apply to eczematoid rash on lower extremities 30 g 1   valACYclovir  (VALTREX ) 500 MG tablet Take 1,000 mg by mouth 3 (three) times daily.     No current facility-administered medications for this visit.    REVIEW OF SYSTEMS:    10 Point review of Systems was  done is negative except as noted above.   PHYSICAL EXAMINATION: ECOG PERFORMANCE STATUS: 1 - Symptomatic but completely ambulatory  Vitals:   11/20/23 1253  BP: 132/63  Pulse: 75  Resp: 18  Temp: (!) 97.5 F (36.4 C)  SpO2: 99%    Filed Weights   11/20/23 1253  Weight: 176 lb 9.6 oz (80.1 kg)    .Body mass index is 28.08 kg/m.  GENERAL:alert, in no acute distress and comfortable SKIN: no acute rashes, no significant lesions EYES: conjunctiva are pink and non-injected, sclera anicteric OROPHARYNX: MMM, no exudates, no oropharyngeal erythema or ulceration NECK: supple, no JVD LYMPH:  no palpable lymphadenopathy in the cervical, axillary or inguinal regions LUNGS: clear to auscultation b/l with normal respiratory effort HEART: regular rate & rhythm ABDOMEN:  normoactive bowel sounds , non tender, not distended. Extremity: no pedal edema PSYCH: alert & oriented x 3 with fluent speech NEURO: no focal motor/sensory deficits   LABORATORY DATA:  I have reviewed the data as listed  .    Latest Ref Rng & Units 11/20/2023   11:59 AM 08/22/2023   10:18 AM 05/23/2023   11:24 AM  CBC  WBC 4.0 - 10.5 K/uL 38.7  15.4  6.7   Hemoglobin 12.0 - 15.0 g/dL 88.8  87.9  87.0   Hematocrit 36.0 - 46.0 % 33.2  35.7  39.6   Platelets 150 - 400 K/uL 190  193  173    .    Latest Ref Rng & Units 11/20/2023   11:59 AM 08/22/2023   10:18 AM 04/27/2023   12:28 AM  CMP  Glucose 70 - 99 mg/dL 95  90  890   BUN 8 - 23 mg/dL 20  16  18    Creatinine 0.44 - 1.00 mg/dL 8.87  8.95  9.11   Sodium 135 - 145 mmol/L 140  141  137   Potassium 3.5 - 5.1 mmol/L 4.3  4.3  3.8   Chloride 98 - 111 mmol/L 108  107  105   CO2 22 - 32 mmol/L 26  30  26    Calcium 8.9 - 10.3 mg/dL 8.7  9.2  8.9   Total Protein 6.5 - 8.1 g/dL 5.9  6.0  5.9   Total Bilirubin 0.0 - 1.2 mg/dL 0.5  0.6  0.5  Alkaline Phos 38 - 126 U/L 62  60  117   AST 15 - 41 U/L 17  13  38   ALT 0 - 44 U/L 13  10  24     . Lab Results   Component Value Date   LDH 223 (H) 11/20/2023    Legend: (L) Low (H) High RADIOGRAPHIC STUDIES: I have personally reviewed the radiological images as listed and agreed with the findings in the report. No results found.   ASSESSMENT & PLAN:   78 year old woman with:   1.  CLL presented with lymphocytosis and adenopathy diagnosed in 2019. 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  Rheumatoid arthritis: She remains without any recent exacerbation.  3.  Bullous lesions on her upper and lower extremities: Possibly related to skin reaction associated with Irbutinib or the patient autoimmune disorder (RA)- much improved with topical steroids.  PLAN:  -Discussed lab results on 11/20/2023 in detail with patient. CBC showed WBC of 38.7K, hemoglobin of 11.1, and platelets of 190K. -her lymphocytes were noted to be previously mildly elevated at 13K three months ago, but have since increased further to 36K -CMP shows creatinine fairly ok at 1.12 and calcium mildly low at 8.7 mg/dL -3/83/7974 CT abdomen/pelvis scan shows  fairly sizable new 8 cm right lateral abdominal wall soft tissue mass  -discussed that a right abdm soft tissue mass 8-9 cm in size is fairly sizable and there is a need to determine whether the mass is related to CLL or not with a biopsy. We discussed that especially since her mass is changing more quickly, there is a need for further evaluation. Discussed that we may need to adjust CLL treatment if her biopsy shows that the mass is related to only CLL.  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 -discussed that CLL can also have associations with autoimmune conditions -we discussed that  since her RA is stable and there is not role for RA medications, and since there is possible bullous related to CLL, and CLL is progressing, we will need to treat her CLL more definitively -I would recommend treating her CLL which might also help control the antibodies responsible for bullous pemphigoid -continue to follow-up with dermatology for evaluation and management of bullous pemphigoid -recommend US  guided core needle biopsy of 8 cm rt abdominal wall mass in 1 week -discussed that there is a role to ensure that there is no concern for subcutanous inflammatory nodules related to RA -there is also a role to rule out possible lipoma, which might require surgical intervention -discussed that sometimes, CLL can transform to lymphoma -assuming biopsy shows that her abdominal mass is related to only CLL, I would recommend switching her Ibrutinib  to next line treatment with Gazyva + Venetoclax combination treatment regimen, which is a fixed duration treatment regimen.  -discussed that Gazyva would be once a week, then once a month, for a total of 6 months. Discussed that after the first month of Gazyva, we will plan to start Venetoclax, adjusting the dose if needed, which would be continued for up to 1 year. Discussed that if she responds to treatment well and is in remission down the line, we will plan to  stop after 1 year and only monitor her from a CLL standpoint.  -recommend wearing loose clothing -recommend staying well-hydrated with at least 64 ounces of water daily -answered all of patient's questions in detail  FOLLOW-UP: US  guided core needle biopsy of rt abdominal wall mass in 1 weeks RTC with Dr Onesimo in 2 weeks Plan to start Gazyva/Venetoclax in 4 weeks  The total time spent in the appointment was 40 minutes* .  All of the patient's questions were answered with apparent satisfaction. The patient knows to call the clinic with any problems, questions or concerns.   Emaline Onesimo MD MS  AAHIVMS [facility] Hematology/Oncology Physician [facility]  .*Total Encounter Time as defined by the Centers for Medicare and Medicaid Services includes, in addition to the face-to-face time of a patient visit (documented in the note above) non-face-to-face time: obtaining and reviewing outside history, ordering and reviewing medications, tests or procedures, care coordination (communications with other health care professionals or caregivers) and documentation in the medical record.    I,Mitra Faeizi,acting as a Neurosurgeon for Emaline Onesimo, MD.,have documented all relevant documentation on the behalf of Emaline Onesimo, MD,as directed by  Emaline Onesimo, MD while in the presence of Emaline Onesimo, MD.  .I have reviewed the above documentation for accuracy and completeness, and I agree with the above. .Keaun Schnabel Kishore Shi Blankenship MD

## 2023-11-20 ENCOUNTER — Inpatient Hospital Stay: Admitting: Hematology

## 2023-11-20 ENCOUNTER — Inpatient Hospital Stay: Attending: Oncology

## 2023-11-20 VITALS — BP 132/63 | HR 75 | Temp 97.5°F | Resp 18 | Wt 176.6 lb

## 2023-11-20 DIAGNOSIS — M069 Rheumatoid arthritis, unspecified: Secondary | ICD-10-CM | POA: Insufficient documentation

## 2023-11-20 DIAGNOSIS — R222 Localized swelling, mass and lump, trunk: Secondary | ICD-10-CM

## 2023-11-20 DIAGNOSIS — C9110 Chronic lymphocytic leukemia of B-cell type not having achieved remission: Secondary | ICD-10-CM | POA: Insufficient documentation

## 2023-11-20 DIAGNOSIS — M799 Soft tissue disorder, unspecified: Secondary | ICD-10-CM | POA: Insufficient documentation

## 2023-11-20 DIAGNOSIS — Z79899 Other long term (current) drug therapy: Secondary | ICD-10-CM | POA: Insufficient documentation

## 2023-11-20 DIAGNOSIS — L139 Bullous disorder, unspecified: Secondary | ICD-10-CM | POA: Insufficient documentation

## 2023-11-20 LAB — CMP (CANCER CENTER ONLY)
ALT: 13 U/L (ref 0–44)
AST: 17 U/L (ref 15–41)
Albumin: 3.9 g/dL (ref 3.5–5.0)
Alkaline Phosphatase: 62 U/L (ref 38–126)
Anion gap: 6 (ref 5–15)
BUN: 20 mg/dL (ref 8–23)
CO2: 26 mmol/L (ref 22–32)
Calcium: 8.7 mg/dL — ABNORMAL LOW (ref 8.9–10.3)
Chloride: 108 mmol/L (ref 98–111)
Creatinine: 1.12 mg/dL — ABNORMAL HIGH (ref 0.44–1.00)
GFR, Estimated: 51 mL/min — ABNORMAL LOW (ref >60)
Glucose, Bld: 95 mg/dL (ref 70–99)
Potassium: 4.3 mmol/L (ref 3.5–5.1)
Sodium: 140 mmol/L (ref 135–145)
Total Bilirubin: 0.5 mg/dL (ref 0.0–1.2)
Total Protein: 5.9 g/dL — ABNORMAL LOW (ref 6.5–8.1)

## 2023-11-20 LAB — CBC WITH DIFFERENTIAL (CANCER CENTER ONLY)
Abs Immature Granulocytes: 0.14 K/uL — ABNORMAL HIGH (ref 0.00–0.07)
Basophils Absolute: 0.1 K/uL (ref 0.0–0.1)
Basophils Relative: 0 %
Eosinophils Absolute: 0.1 K/uL (ref 0.0–0.5)
Eosinophils Relative: 0 %
HCT: 33.2 % — ABNORMAL LOW (ref 36.0–46.0)
Hemoglobin: 11.1 g/dL — ABNORMAL LOW (ref 12.0–15.0)
Immature Granulocytes: 0 %
Lymphocytes Relative: 94 %
Lymphs Abs: 36 K/uL — ABNORMAL HIGH (ref 0.7–4.0)
MCH: 33 pg (ref 26.0–34.0)
MCHC: 33.4 g/dL (ref 30.0–36.0)
MCV: 98.8 fL (ref 80.0–100.0)
Monocytes Absolute: 0.3 K/uL (ref 0.1–1.0)
Monocytes Relative: 1 %
Neutro Abs: 2.1 K/uL (ref 1.7–7.7)
Neutrophils Relative %: 5 %
Platelet Count: 190 K/uL (ref 150–400)
RBC: 3.36 MIL/uL — ABNORMAL LOW (ref 3.87–5.11)
RDW: 14.7 % (ref 11.5–15.5)
Smear Review: NORMAL
WBC Count: 38.7 K/uL — ABNORMAL HIGH (ref 4.0–10.5)
nRBC: 0.1 % (ref 0.0–0.2)

## 2023-11-20 LAB — LACTATE DEHYDROGENASE: LDH: 223 U/L — ABNORMAL HIGH (ref 98–192)

## 2023-11-27 ENCOUNTER — Encounter: Payer: Self-pay | Admitting: Hematology

## 2023-11-27 ENCOUNTER — Encounter (HOSPITAL_COMMUNITY): Payer: Self-pay

## 2023-11-27 NOTE — Progress Notes (Signed)
 Nicole Sieving, MD  Nicole Campbell PROCEDURE / BIOPSY REVIEW Date: 11/27/23  Requested Biopsy site: R body wall ST mass Reason for request: r/o CLL Imaging review: Best seen on CT 10/15/23  Decision: Approved Imaging modality to perform: Ultrasound Schedule with: No sedation / Local anesthetic Schedule for: Any VIR  Additional comments: @VIR : in saline  Please contact me with questions, concerns, or if issue pertaining to this request arise.  Nicole Campbell [name], MD Vascular and Interventional Radiology Specialists [facility] Radiology  '       Previous Messages    ----- Message ----- From: Nicole Campbell Sent: 11/27/2023   7:50 AM EDT To: Nicole Campbell; Ir Procedure Requests Subject: US  core biopsy ( soft tissue )                Procedure : US  core Biopsy ( Soft tissue )  Reason: core needle biopsy of rt abdominal wall mass ? SLL Dx: Abdominal wall mass [R22.2 (ICD-10-CM)]    History : CT abd pelvis w/ contrast , US  pelvis limited  Provider : Onesimo Emaline Brink, MD  Contact : [phone]

## 2023-12-04 ENCOUNTER — Encounter (INDEPENDENT_AMBULATORY_CARE_PROVIDER_SITE_OTHER): Payer: Self-pay

## 2023-12-04 ENCOUNTER — Ambulatory Visit: Admitting: Hematology

## 2023-12-05 ENCOUNTER — Other Ambulatory Visit: Payer: Self-pay | Admitting: Pharmacy Technician

## 2023-12-05 ENCOUNTER — Other Ambulatory Visit: Payer: Self-pay

## 2023-12-05 NOTE — Progress Notes (Signed)
 Specialty Pharmacy Refill Coordination Note  Nicole Campbell is a 78 y.o. female contacted today regarding refills of specialty medication(s) Ibrutinib  (IMBRUVICA )   Patient requested (Patient-Rptd) Delivery   Delivery date: 12/12/23 Verified address: (Patient-Rptd) [address] [place], [place] [address]   Medication will be filled on 12/11/23.

## 2023-12-06 ENCOUNTER — Other Ambulatory Visit: Payer: Self-pay

## 2023-12-12 ENCOUNTER — Other Ambulatory Visit: Payer: Self-pay

## 2023-12-12 ENCOUNTER — Ambulatory Visit (HOSPITAL_COMMUNITY)
Admission: RE | Admit: 2023-12-12 | Discharge: 2023-12-12 | Disposition: A | Discharge status: Home / Self Care | Source: Ambulatory Visit | Attending: Hematology | Admitting: Hematology

## 2023-12-12 DIAGNOSIS — R222 Localized swelling, mass and lump, trunk: Secondary | ICD-10-CM | POA: Diagnosis not present

## 2023-12-12 DIAGNOSIS — C9110 Chronic lymphocytic leukemia of B-cell type not having achieved remission: Secondary | ICD-10-CM | POA: Insufficient documentation

## 2023-12-12 DIAGNOSIS — R1900 Intra-abdominal and pelvic swelling, mass and lump, unspecified site: Secondary | ICD-10-CM | POA: Diagnosis not present

## 2023-12-12 MED ORDER — LIDOCAINE HCL 1 % IJ SOLN
INTRAMUSCULAR | Status: AC
  Filled 2023-12-12: qty 20

## 2023-12-12 NOTE — Procedures (Signed)
 Vascular and Interventional Radiology Procedure Note  Patient: Nicole Campbell DOB: Dec 11, 1945 Medical Record Number: [id number] Note Date/Time: 12/12/23 1:06 PM   Performing Physician: Thom Hall, MD Assistant(s): None  Diagnosis: R flank mass. Q CLL   Procedure: RIGHT SUBCUTANEOUS FLANK MASS BIOPSY  Anesthesia: Local Anesthetic Complications: None Estimated Blood Loss: Minimal Specimens: Sent for Pathology  Findings:  Successful Ultrasound-guided biopsy of a R flank mass. A total of 3 samples were obtained. Hemostasis of the tract was achieved using Manual Pressure.  Plan: Bed rest for 0 hours.  See detailed procedure note with images in PACS. The patient tolerated the procedure well without incident or complication and was returned to Recovery in stable condition.    Thom Hall, MD Vascular and Interventional Radiology Specialists [facility] Radiology   Pager. [phone] Clinic. [phone]

## 2023-12-14 LAB — SURGICAL PATHOLOGY

## 2023-12-17 DIAGNOSIS — E7800 Pure hypercholesterolemia, unspecified: Secondary | ICD-10-CM | POA: Diagnosis not present

## 2023-12-17 DIAGNOSIS — C9110 Chronic lymphocytic leukemia of B-cell type not having achieved remission: Secondary | ICD-10-CM | POA: Diagnosis not present

## 2023-12-17 DIAGNOSIS — Z0000 Encounter for general adult medical examination without abnormal findings: Secondary | ICD-10-CM | POA: Diagnosis not present

## 2023-12-17 DIAGNOSIS — R519 Headache, unspecified: Secondary | ICD-10-CM | POA: Diagnosis not present

## 2023-12-17 DIAGNOSIS — Z23 Encounter for immunization: Secondary | ICD-10-CM | POA: Diagnosis not present

## 2023-12-17 DIAGNOSIS — Z1331 Encounter for screening for depression: Secondary | ICD-10-CM | POA: Diagnosis not present

## 2023-12-17 DIAGNOSIS — M069 Rheumatoid arthritis, unspecified: Secondary | ICD-10-CM | POA: Diagnosis not present

## 2023-12-17 DIAGNOSIS — E039 Hypothyroidism, unspecified: Secondary | ICD-10-CM | POA: Diagnosis not present

## 2023-12-17 DIAGNOSIS — Z13820 Encounter for screening for osteoporosis: Secondary | ICD-10-CM | POA: Diagnosis not present

## 2023-12-18 ENCOUNTER — Encounter (HOSPITAL_BASED_OUTPATIENT_CLINIC_OR_DEPARTMENT_OTHER): Payer: Self-pay

## 2023-12-18 ENCOUNTER — Other Ambulatory Visit (HOSPITAL_BASED_OUTPATIENT_CLINIC_OR_DEPARTMENT_OTHER): Payer: Self-pay | Admitting: Family Medicine

## 2023-12-18 DIAGNOSIS — E2839 Other primary ovarian failure: Secondary | ICD-10-CM

## 2023-12-26 ENCOUNTER — Other Ambulatory Visit: Payer: Self-pay

## 2023-12-26 ENCOUNTER — Inpatient Hospital Stay: Attending: Oncology | Admitting: Hematology

## 2023-12-26 VITALS — BP 154/97 | HR 87 | Temp 97.3°F | Resp 20 | Wt 178.2 lb

## 2023-12-26 DIAGNOSIS — Z79899 Other long term (current) drug therapy: Secondary | ICD-10-CM | POA: Diagnosis not present

## 2023-12-26 DIAGNOSIS — L120 Bullous pemphigoid: Secondary | ICD-10-CM | POA: Insufficient documentation

## 2023-12-26 DIAGNOSIS — C9110 Chronic lymphocytic leukemia of B-cell type not having achieved remission: Secondary | ICD-10-CM | POA: Diagnosis not present

## 2023-12-26 DIAGNOSIS — M069 Rheumatoid arthritis, unspecified: Secondary | ICD-10-CM | POA: Diagnosis not present

## 2023-12-27 ENCOUNTER — Other Ambulatory Visit: Payer: Self-pay

## 2024-01-01 ENCOUNTER — Other Ambulatory Visit: Payer: Self-pay

## 2024-01-01 NOTE — Progress Notes (Signed)
 Specialty Pharmacy Ongoing Clinical Assessment Note  Nicole Campbell is a 78 y.o. female who is being followed by the specialty pharmacy service for RxSp Oncology   Patient's specialty medication(s) reviewed today: No data recorded  Missed doses in the last 4 weeks: 0   Patient/Caregiver did not have any additional questions or concerns.   Therapeutic benefit summary: Patient is achieving benefit   Adverse events/side effects summary: No adverse events/side effects   Patient's therapy is appropriate to: Continue    Goals Addressed             This Visit's Progress    Slow Disease Progression   No change    Patient is not on track and no change. Patient will maintain adherence and be monitored by provider to determine if a change in treatment plan is warranted. Per office visit on 08/22/23, Dr. Onesimo is monitoring and noticed increased in WBCs. Pt is on subclinical dosing currently, Dr. Onesimo will re-evaluate and decide if a dose adjustment is warranted.          Follow up: 6 months  Silvano LOISE Dolly Specialty Pharmacist

## 2024-01-02 ENCOUNTER — Telehealth: Payer: Self-pay

## 2024-01-02 ENCOUNTER — Other Ambulatory Visit: Payer: Self-pay

## 2024-01-02 ENCOUNTER — Other Ambulatory Visit (HOSPITAL_COMMUNITY): Payer: Self-pay

## 2024-01-02 ENCOUNTER — Encounter: Payer: Self-pay | Admitting: Hematology

## 2024-01-02 MED ORDER — PROCHLORPERAZINE MALEATE 10 MG PO TABS
10.0000 mg | ORAL_TABLET | Freq: Four times a day (QID) | ORAL | 1 refills | Status: AC | PRN
Start: 2024-01-02 — End: ?
  Filled 2024-01-02: qty 30, 8d supply, fill #0

## 2024-01-02 MED ORDER — ALLOPURINOL 100 MG PO TABS
100.0000 mg | ORAL_TABLET | Freq: Every day | ORAL | 2 refills | Status: AC
  Filled 2024-01-02: qty 60, 60d supply, fill #0
  Filled 2024-02-27: qty 60, 60d supply, fill #1
  Filled 2024-04-27: qty 60, 60d supply, fill #2

## 2024-01-02 MED ORDER — VENETOCLAX 10 & 50 & 100 MG PO TBPK
ORAL_TABLET | ORAL | 0 refills | Status: DC
  Filled 2024-01-02: qty 42, fill #0
  Filled 2024-04-28: qty 42, 28d supply, fill #0

## 2024-01-02 MED ORDER — ONDANSETRON HCL 8 MG PO TABS
8.0000 mg | ORAL_TABLET | Freq: Three times a day (TID) | ORAL | 1 refills | Status: AC | PRN
  Filled 2024-01-02: qty 30, 10d supply, fill #0

## 2024-01-02 MED ORDER — ACYCLOVIR 400 MG PO TABS
400.0000 mg | ORAL_TABLET | Freq: Every day | ORAL | 5 refills | Status: AC
  Filled 2024-01-02: qty 30, 30d supply, fill #0
  Filled 2024-01-28: qty 30, 30d supply, fill #1
  Filled 2024-02-27: qty 30, 30d supply, fill #2
  Filled 2024-03-28: qty 30, 30d supply, fill #3
  Filled 2024-04-23: qty 30, 30d supply, fill #4
  Filled 2024-05-23: qty 30, 30d supply, fill #5

## 2024-01-02 NOTE — Telephone Encounter (Signed)
 Oral Oncology Patient Advocate Encounter   Received notification that prior authorization for Venclexta  is required.   PA submitted on 01/02/24 Key [id number] Status is pending    Lucie Lamer, CPhT [place]  [facility] Pharmacy Technician Patient Advocate Specialist II [place] Phone: [phone]  Fax: [phone] [email or web address]

## 2024-01-02 NOTE — Progress Notes (Signed)
 Error

## 2024-01-02 NOTE — Progress Notes (Signed)
 HEMATOLOGY/ONCOLOGY CLINIC NOTE  Date of Service: .12/26/2023  Patient Care Team: Claudene Pellet, MD as PCP - General (Family Medicine) Jesus Oliphant, MD as Consulting Physician (Otolaryngology)  CHIEF COMPLAINTS/PURPOSE OF CONSULTATION:  Follow-up for continued valuation and management of CLL and follow-up for biopsy of right flank mass  Prior therapy:    She is status post rituximab  weekly for 4 weeks completed on August 21, 2018. She is status post thoracentesis completed on August 12, 2019 with positive cytology. Rituximab  375 mg per metered square weekly started on October 13 of 2020 and completed on 03/04/2019.  This will be repeated every 3 months last treatment completed in March 2021. Bendamustine  and rituximab  started on Sep 03, 2019.  She is here for cycle 3 of therapy.  HISTORY OF PRESENTING ILLNESS:   Nicole Campbell is a wonderful 78 y.o. female who is here for continued evaluation and management of CLL. Patient was following up with Dr. Amadeo and has transferried her care to me. She was first diagnosed with CLL in 2019 where she presented with leukocytosis with p53 deletion.   Patient's current treatment is Ibrutinib  420 mg every other day. She was first started on Ibrutinib  daily in July 2020, but was switched to Ibrutinib  every other day in September 2023 due to presumed dermatological toxicity.  Patient notes that she was getting insect bites which was causing her bruising and scaring in September 2023, which is the reason Dr. Amadeo changed her treatment to Ibrutinib  to every other day.   INTERVAL HISTORY:  Nicole Campbell is a 78 y.o. female who is here for continued evaluation and management of her CLL and follow-up of the biopsy results from biopsy of her right flank mass. The biopsy was consistent with a mass related to CLL. Patient notes no new fevers chills or night sweats. No bleeding on ibrutinib . Still having intermittent bullous skin lesions but  none that are active at this time.  MEDICAL HISTORY:  Past Medical History:  Diagnosis Date   CLL (chronic lymphocytic leukemia) (HCC)    Hypercalcemia    Hypertension    Hypothyroid    MVP (mitral valve prolapse)    Rheumatoid arthritis (HCC)    Shingles     SURGICAL HISTORY: Past Surgical History:  Procedure Laterality Date   ABDOMINAL HYSTERECTOMY     IR THORACENTESIS ASP PLEURAL SPACE W/IMG GUIDE  11/21/2019   IR THORACENTESIS ASP PLEURAL SPACE W/IMG GUIDE  07/05/2020   TONSILLECTOMY      SOCIAL HISTORY: Social History   Socioeconomic History   Marital status: Married    Spouse name: Not on file   Number of children: 3   Years of education: Not on file   Highest education level: Not on file  Occupational History   Not on file  Tobacco Use   Smoking status: Never   Smokeless tobacco: Never  Vaping Use   Vaping status: Never Used  Substance and Sexual Activity   Alcohol use: Yes    Comment: Rare   Drug use: Never   Sexual activity: Not on file  Other Topics Concern   Not on file  Social History Narrative   Not on file   Social Drivers of Health   Financial Resource Strain: Not on file  Food Insecurity: No Food Insecurity (03/16/2023)   Hunger Vital Sign    Worried About Running Out of Food in the Last Year: Never true    Ran Out of Food in the Last  Year: Never true  Transportation Needs: No Transportation Needs (03/16/2023)   PRAPARE - Administrator, Civil Service (Medical): No    Lack of Transportation (Non-Medical): No  Physical Activity: Not on file  Stress: Not on file  Social Connections: Not on file  Intimate Partner Violence: Not At Risk (03/16/2023)   Humiliation, Afraid, Rape, and Kick questionnaire    Fear of Current or Ex-Partner: No    Emotionally Abused: No    Physically Abused: No    Sexually Abused: No    FAMILY HISTORY: Family History  Problem Relation Age of Onset   Congestive Heart Failure Father     ALLERGIES:   is allergic to methotrexate and erythromycin.  MEDICATIONS:  Current Outpatient Medications  Medication Sig Dispense Refill   acetaminophen  (TYLENOL ) 650 MG CR tablet Take 1,300 mg by mouth every 8 (eight) hours as needed for pain.     escitalopram  (LEXAPRO ) 5 MG tablet Take 5 mg by mouth daily.     gabapentin  (NEURONTIN ) 300 MG capsule Take 1 capsule (300 mg total) by mouth 2 (two) times daily. Do not drive or operate heavy machinery till you know how this affects you. Can cause some sedation. 60 capsule 1   ibrutinib  (IMBRUVICA ) 140 MG tablet Take 2 tablets (280 mg total) by mouth daily. Take with a glass of water. 60 tablet 5   levothyroxine  (SYNTHROID , LEVOTHROID) 25 MCG tablet Take 25 mcg by mouth daily before breakfast.      ondansetron  (ZOFRAN -ODT) 4 MG disintegrating tablet Take 1 tablet (4 mg total) by mouth every 8 (eight) hours as needed for nausea or vomiting. 20 tablet 0   oxyCODONE -acetaminophen  (PERCOCET) 5-325 MG tablet Take 1 tablet by mouth every 4 (four) hours as needed for moderate pain (pain score 4-6). 20 tablet 0   triamcinolone  (KENALOG ) 0.025 % ointment Apply 1 Application topically 2 (two) times daily. Apply to eczematoid rash on lower extremities 30 g 1   valACYclovir  (VALTREX ) 500 MG tablet Take 1,000 mg by mouth 3 (three) times daily.     No current facility-administered medications for this visit.    REVIEW OF SYSTEMS:   .10 Point review of Systems was done is negative except as noted above.  PHYSICAL EXAMINATION: ECOG PERFORMANCE STATUS: 1 - Symptomatic but completely ambulatory  Vitals:   12/26/23 1328  BP: (!) 154/97  Pulse: 87  Resp: 20  Temp: (!) 97.3 F (36.3 C)  SpO2: 98%    Filed Weights   12/26/23 1328  Weight: 178 lb 3.2 oz (80.8 kg)    .Body mass index is 28.33 kg/m. Nicole Campbell GENERAL:alert, in no acute distress and comfortable SKIN: no acute rashes, no significant lesions EYES: conjunctiva are pink and non-injected, sclera  anicteric OROPHARYNX: MMM, no exudates, no oropharyngeal erythema or ulceration NECK: supple, no JVD LYMPH:  no palpable lymphadenopathy in the cervical, axillary or inguinal regions LUNGS: clear to auscultation b/l with normal respiratory effort HEART: regular rate & rhythm ABDOMEN:  normoactive bowel sounds , non tender, not distended. Extremity: no pedal edema PSYCH: alert & oriented x 3 with fluent speech NEURO: no focal motor/sensory deficits   LABORATORY DATA:  I have reviewed the data as listed  .    Latest Ref Rng & Units 11/20/2023   11:59 AM 08/22/2023   10:18 AM 05/23/2023   11:24 AM  CBC  WBC 4.0 - 10.5 K/uL 38.7  15.4  6.7   Hemoglobin 12.0 - 15.0 g/dL 88.8  12.0  12.9   Hematocrit 36.0 - 46.0 % 33.2  35.7  39.6   Platelets 150 - 400 K/uL 190  193  173    .    Latest Ref Rng & Units 11/20/2023   11:59 AM 08/22/2023   10:18 AM 04/27/2023   12:28 AM  CMP  Glucose 70 - 99 mg/dL 95  90  890   BUN 8 - 23 mg/dL 20  16  18    Creatinine 0.44 - 1.00 mg/dL 8.87  8.95  9.11   Sodium 135 - 145 mmol/L 140  141  137   Potassium 3.5 - 5.1 mmol/L 4.3  4.3  3.8   Chloride 98 - 111 mmol/L 108  107  105   CO2 22 - 32 mmol/L 26  30  26    Calcium 8.9 - 10.3 mg/dL 8.7  9.2  8.9   Total Protein 6.5 - 8.1 g/dL 5.9  6.0  5.9   Total Bilirubin 0.0 - 1.2 mg/dL 0.5  0.6  0.5   Alkaline Phos 38 - 126 U/L 62  60  117   AST 15 - 41 U/L 17  13  38   ALT 0 - 44 U/L 13  10  24     . Lab Results  Component Value Date   LDH 223 (H) 11/20/2023   SURGICAL PATHOLOGY CASE: [id number] PATIENT: Nicole Campbell Surgical Pathology Report     Clinical History: left flank mass, Q CLL (tb)     FINAL MICROSCOPIC DIAGNOSIS:  A. RIGHT FLANK MASS NEEDLE CORE: - Involved by the patients known chronic lymphocytic leukemia/small lymphocytic lymphoma (see Comment)  COMMENT: Sections from the flank mass show an atypical lymphoid infiltrate comprised of small to medium sized cells, and focal  areas with larger cells. Immunohistochemical stains reveal the neoplastic cells are positive for CD20, CD5 and CD23. The larger cells show positivity for cyclin D1. The ki-67 proliferation index is overall low with increase in focal areas, likely consistent with proliferation centers. No sheets of large cells are noted and transformation to a diffuse large B cell lymphoma is considered much less likely. However, this evaluation is based on the needle core biopsy and the possibility of transformation in other areas cannot be completely excluded. Clinical and imaging correlation is recommended.  Legend: (L) Low (H) High RADIOGRAPHIC STUDIES: I have personally reviewed the radiological images as listed and agreed with the findings in the report. US  CORE BIOPSY (SOFT TISSUE) Result Date: 12/12/2023 INDICATION: core needle biopsy of rt abdominal wall mass ? SLL EXAM: ULTRASOUND-GUIDED RIGHT FLANK SUBCUTANEOUS MASS BIOPSY COMPARISON:  None Available. MEDICATIONS: None ANESTHESIA/SEDATION: Local anesthetic was administered. COMPLICATIONS: None immediate. TECHNIQUE: Informed written consent was obtained from the patient after a discussion of the risks, benefits and alternatives to treatment. Questions regarding the procedure were encouraged and answered. Initial ultrasound scanning demonstrated a heterogeneously-echogenic moderately vascular RIGHT flank mass. An ultrasound image was saved for documentation purposes. The procedure was planned. A timeout was performed prior to the initiation of the procedure. The operative was prepped and draped in the usual sterile fashion, and a sterile drape was applied covering the operative field. A timeout was performed prior to the initiation of the procedure. Local anesthesia was provided with 1% lidocaine  with epinephrine. Under direct ultrasound guidance, an 18 gauge core needle device was utilized to obtain to obtain 4 core needle biopsies of the RIGHT flank mass.  The samples were placed in saline and submitted to pathology. The needle was removed  and superficial hemostasis was achieved with manual compression. Post procedure scan was negative for significant hematoma. A dressing was applied. The patient tolerated the procedure well without immediate postprocedural complication. IMPRESSION: Successful ultrasound guided core biopsy of a RIGHT flank subcutaneous mass. Thom Hall, MD [facility] Radiology Electronically Signed   By: Thom Hall M.D.   On: 12/12/2023 16:25     ASSESSMENT & PLAN:   78 year old woman with:   1.  CLL presented with lymphocytosis and adenopathy diagnosed in 2019. 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  Rheumatoid arthritis: She remains without any recent exacerbation.  3.  Bullous lesions on her upper and lower extremities: Possibly related to skin reaction associated with Irbutinib or the patient autoimmune disorder (RA)- much improved with topical steroids.  PLAN: - Results from right flank mass biopsy from 12/12/2023 which showed CLL.  No evidence of large cell transformation. Patient's ibrutinib  is not controlling her CLL with progressive increase in blood lymphocyte counts -Given increasing size of right flank mass and increasing lymphocyte counts patient clearly appears to have progression of her CLL on ibrutinib . - Also there is concern for autoimmune  phenomenon related to CLL that could be contributing to her bullous pemphigoid like lesions. We discussed that-treating her CLL optimally might help with controlling the right flank mass as well as her bullous skin lesions and controlling her CLL better especially since she has high risk CLL with 17P and 11 q. Deletions. - We recommended and patient is agreeable to proceeding with Gazyva/venetoclax  as second line treatment.  We discussed that the antibody may also help control some of the symptoms of her rheumatoid  arthritis. - Informed consent obtained by the patient.  Will plan to set her up to start treatment in the next 1 to 2 weeks -Will plan to hold venetoclax  from cycle 2 - She will hold ibrutinib  when she starts the new treatment - Allopurinol  100 mg p.o. twice daily for TLS prophylaxis - Zofran  as needed for nausea  FOLLOW-UP: Chemo counseling for Gazyva venetoclax  Plan to start Gazyva in 1 to 2 weeks MD visit with cycle 1 day 2 of treatment for toxicity check   .The total time spent in the appointment was 30 minutes* .  All of the patient's questions were answered with apparent satisfaction. The patient knows to call the clinic with any problems, questions or concerns.   Nicole Saran MD MS AAHIVMS [facility] Hematology/Oncology Physician [facility]  .*Total Encounter Time as defined by the Centers for Medicare and Medicaid Services includes, in addition to the face-to-face time of a patient visit (documented in the note above) non-face-to-face time: obtaining and reviewing outside history, ordering and reviewing medications, tests or procedures, care coordination (communications with other health care professionals or caregivers) and documentation in the medical record.

## 2024-01-02 NOTE — Progress Notes (Signed)
 DISCONTINUE OFF PATHWAY REGIMEN - Lymphoma and CLL   [id number]:Bendamustine  + Rituximab  (90/375) q28 Days:   A cycle is every 28 days:     Bendamustine       Rituximab -xxxx   **Always confirm dose/schedule in your pharmacy ordering system**  PRIOR TREATMENT: Off Pathway: Bendamustine  + Rituximab  (90/375) q28 Days  START ON PATHWAY REGIMEN - Lymphoma and CLL     Cycle 1: A cycle is 28 days:     Venetoclax       Obinutuzumab      Obinutuzumab      Obinutuzumab    Cycle 2: A cycle is 28 days:     Venetoclax       Venetoclax       Venetoclax       Venetoclax       Obinutuzumab    Cycles 3 through 6: A cycle is 28 days:     Venetoclax       Obinutuzumab    Cycles 7 through 12: A cycle is 28 days:     Venetoclax    **Always confirm dose/schedule in your pharmacy ordering system**  Patient Characteristics: Chronic Lymphocytic Leukemia (CLL), Treatment Indicated, First Line Disease Type: Chronic Lymphocytic Leukemia (CLL) Disease Type: Not Applicable Disease Type: Not Applicable Treatment Indicated<= Treatment Indicated Line of Therapy: First Line Intent of Therapy: Non-Curative / Palliative Intent, Discussed with Patient

## 2024-01-02 NOTE — Telephone Encounter (Signed)
 Oral Oncology Patient Advocate Encounter  Prior Authorization for Venclexta  has been approved.    PA# [id number] Effective dates: 01/02/24 through 01/01/25  Patients co-pay is $0.00.    Lucie Lamer, CPhT   [facility] Pharmacy Technician Patient Advocate Specialist [facility] [place] Phone: [phone]  Fax: [phone] [email or web address]

## 2024-01-03 ENCOUNTER — Telehealth: Payer: Self-pay | Admitting: Hematology

## 2024-01-03 ENCOUNTER — Other Ambulatory Visit: Payer: Self-pay

## 2024-01-03 ENCOUNTER — Encounter: Payer: Self-pay | Admitting: Hematology

## 2024-01-03 ENCOUNTER — Other Ambulatory Visit (HOSPITAL_COMMUNITY): Payer: Self-pay

## 2024-01-03 ENCOUNTER — Encounter: Payer: Self-pay | Admitting: Pharmacist

## 2024-01-03 NOTE — Telephone Encounter (Addendum)
 PGY2 Oncology Pharmacy Resident Encounter - Oral Chemo Assessment  Received new prescription for Venclexta  (venetoclax ) for the treatment of Chronic Lymphocytic Leukemia (CLL) in conjunction with Obinutuzumab  for x 6 cycles and then continue as monotherapy for a total of 12 cycles, or until unacceptable toxicity or disease progression, which ever comes first.  Plan to start Venclexta  (venetoclax ) beginning of cycle 2.   Oral Chemo Regimen  Medication/dosing schedule: Venclexta  (venetoclax )  Starter Pack for Ramp Up Dosing  Take 20 mg daily x 7 days, then 50 mg daily x 7 days, then 100 mg daily x 7 days, then 200 mg daily x 7 days.  After ramp up, plan for 400 mg daily if tolerated  REMs program: No, not applicable for this medication   Prescription dose and frequency assessed for appropriateness: Ramp up dosing is appropriate, need to assess labs before starting for renal, hepatic function and TLS risk.   Labs/Baseline Monitoring   Required baseline labs: CBC, CMP, uric acid, phos  Labs ordered on 01/07/2024 Renal/hepatic function: SCr up-trending (51 mL/min), LFTs WNL Updated Hepatitis B Panel: Negative TLS risk: K 3.9, uric acid 6.0, Ca 9.2, phos 71 Counts: ANC 1.5, Plt 179, WBC 60, Hgb 9.8 Plan: No dose adjustment required based on current labs  Current Medications  Current medication list reviewed in Epic No relevant DDIs associated with Venclexta  (venetoclax ) identified Actions required: None at this time  Supportive Care Needs Yes: Nausea/Vomiting Compazine  10 mg every 6 hours PRN Yes: TLS Allopurinol  100 mg daily  Infection/VTE ppx Needs Yes: HSV ppx Acyclovir  400 mg BID  No: VTE ppx:  Not applicable for this medication regimen  Pending Assessment: Hep B ppx Assessment for need pending labs   Logistics and Coordination  Evaluated chart and no patient barriers to medication adherence identified.  Prescription has been e-scribed to the [facility] for benefits analysis and approval. Oral Oncology Clinic will continue to follow for insurance authorization, copayment issues, initial counseling and start date.  Thank you for allowing pharmacy to participate in this patient's care.  Nicole Campbell, PharmD Pharmacy Resident  01/03/2024 10:26 AM

## 2024-01-03 NOTE — Progress Notes (Signed)
 Contacted pt regarding schedule and medication questions. Oral medication was approved by insurance and Pharmacy to contact pt with instructions. Pt currently in the process of being scheduled. Pt acknowledged information and verbalized understanding.

## 2024-01-04 ENCOUNTER — Inpatient Hospital Stay: Attending: Oncology

## 2024-01-04 ENCOUNTER — Other Ambulatory Visit: Payer: Self-pay

## 2024-01-04 DIAGNOSIS — C9110 Chronic lymphocytic leukemia of B-cell type not having achieved remission: Secondary | ICD-10-CM | POA: Insufficient documentation

## 2024-01-04 DIAGNOSIS — R6883 Chills (without fever): Secondary | ICD-10-CM | POA: Insufficient documentation

## 2024-01-04 DIAGNOSIS — Z5112 Encounter for antineoplastic immunotherapy: Secondary | ICD-10-CM | POA: Insufficient documentation

## 2024-01-04 DIAGNOSIS — R6889 Other general symptoms and signs: Secondary | ICD-10-CM | POA: Insufficient documentation

## 2024-01-04 DIAGNOSIS — Z79899 Other long term (current) drug therapy: Secondary | ICD-10-CM | POA: Insufficient documentation

## 2024-01-04 MED FILL — Dexamethasone Sodium Phosphate Inj 100 MG/10ML: INTRAMUSCULAR | Qty: 2 | Status: AC

## 2024-01-07 ENCOUNTER — Inpatient Hospital Stay

## 2024-01-07 ENCOUNTER — Inpatient Hospital Stay (HOSPITAL_BASED_OUTPATIENT_CLINIC_OR_DEPARTMENT_OTHER): Admitting: Physician Assistant

## 2024-01-07 VITALS — BP 123/60 | HR 101 | Temp 98.2°F | Resp 20 | Wt 178.0 lb

## 2024-01-07 VITALS — BP 184/83 | HR 87 | Resp 18

## 2024-01-07 DIAGNOSIS — Z79899 Other long term (current) drug therapy: Secondary | ICD-10-CM | POA: Diagnosis not present

## 2024-01-07 DIAGNOSIS — C9110 Chronic lymphocytic leukemia of B-cell type not having achieved remission: Secondary | ICD-10-CM

## 2024-01-07 DIAGNOSIS — T50905A Adverse effect of unspecified drugs, medicaments and biological substances, initial encounter: Secondary | ICD-10-CM | POA: Diagnosis not present

## 2024-01-07 DIAGNOSIS — Z5112 Encounter for antineoplastic immunotherapy: Secondary | ICD-10-CM | POA: Diagnosis not present

## 2024-01-07 DIAGNOSIS — R6889 Other general symptoms and signs: Secondary | ICD-10-CM | POA: Diagnosis not present

## 2024-01-07 DIAGNOSIS — R6883 Chills (without fever): Secondary | ICD-10-CM | POA: Diagnosis not present

## 2024-01-07 LAB — CMP (CANCER CENTER ONLY)
ALT: 12 U/L (ref 0–44)
AST: 15 U/L (ref 15–41)
Albumin: 4 g/dL (ref 3.5–5.0)
Alkaline Phosphatase: 71 U/L (ref 38–126)
Anion gap: 7 (ref 5–15)
BUN: 21 mg/dL (ref 8–23)
CO2: 27 mmol/L (ref 22–32)
Calcium: 9.2 mg/dL (ref 8.9–10.3)
Chloride: 106 mmol/L (ref 98–111)
Creatinine: 1.2 mg/dL — ABNORMAL HIGH (ref 0.44–1.00)
GFR, Estimated: 47 mL/min — ABNORMAL LOW (ref >60)
Glucose, Bld: 149 mg/dL — ABNORMAL HIGH (ref 70–99)
Potassium: 3.9 mmol/L (ref 3.5–5.1)
Sodium: 140 mmol/L (ref 135–145)
Total Bilirubin: 0.6 mg/dL (ref 0.0–1.2)
Total Protein: 6 g/dL — ABNORMAL LOW (ref 6.5–8.1)

## 2024-01-07 LAB — CBC WITH DIFFERENTIAL (CANCER CENTER ONLY)
Abs Immature Granulocytes: 0.12 K/uL — ABNORMAL HIGH (ref 0.00–0.07)
Basophils Absolute: 0 K/uL (ref 0.0–0.1)
Basophils Relative: 0 %
Eosinophils Absolute: 0.1 K/uL (ref 0.0–0.5)
Eosinophils Relative: 0 %
HCT: 29.5 % — ABNORMAL LOW (ref 36.0–46.0)
Hemoglobin: 9.8 g/dL — ABNORMAL LOW (ref 12.0–15.0)
Immature Granulocytes: 0 %
Lymphocytes Relative: 95 %
Lymphs Abs: 57.2 K/uL — ABNORMAL HIGH (ref 0.7–4.0)
MCH: 33.4 pg (ref 26.0–34.0)
MCHC: 33.2 g/dL (ref 30.0–36.0)
MCV: 100.7 fL — ABNORMAL HIGH (ref 80.0–100.0)
Monocytes Absolute: 1 K/uL (ref 0.1–1.0)
Monocytes Relative: 2 %
Neutro Abs: 1.5 K/uL — ABNORMAL LOW (ref 1.7–7.7)
Neutrophils Relative %: 3 %
Platelet Count: 179 K/uL (ref 150–400)
RBC: 2.93 MIL/uL — ABNORMAL LOW (ref 3.87–5.11)
RDW: 15 % (ref 11.5–15.5)
Smear Review: NORMAL
WBC Count: 60 K/uL (ref 4.0–10.5)
nRBC: 0 % (ref 0.0–0.2)

## 2024-01-07 LAB — LACTATE DEHYDROGENASE: LDH: 267 U/L — ABNORMAL HIGH (ref 98–192)

## 2024-01-07 LAB — URIC ACID: Uric Acid, Serum: 6 mg/dL (ref 2.5–7.1)

## 2024-01-07 LAB — HEPATITIS B SURFACE ANTIGEN: Hepatitis B Surface Ag: NONREACTIVE

## 2024-01-07 MED ORDER — SODIUM CHLORIDE 0.9 % IV SOLN
100.0000 mg | Freq: Once | INTRAVENOUS | Status: AC
  Administered 2024-01-07: 100 mg via INTRAVENOUS
  Filled 2024-01-07: qty 4

## 2024-01-07 MED ORDER — MONTELUKAST SODIUM 10 MG PO TABS
10.0000 mg | ORAL_TABLET | Freq: Once | ORAL | Status: AC
  Administered 2024-01-07: 10 mg via ORAL
  Filled 2024-01-07: qty 1

## 2024-01-07 MED ORDER — FAMOTIDINE IN NACL 20-0.9 MG/50ML-% IV SOLN
20.0000 mg | Freq: Once | INTRAVENOUS | Status: AC | PRN
  Administered 2024-01-07: 20 mg via INTRAVENOUS

## 2024-01-07 MED ORDER — ACETAMINOPHEN 325 MG PO TABS
650.0000 mg | ORAL_TABLET | Freq: Once | ORAL | Status: AC
  Administered 2024-01-07: 650 mg via ORAL
  Filled 2024-01-07: qty 2

## 2024-01-07 MED ORDER — SODIUM CHLORIDE 0.9 % IV SOLN: Freq: Once | INTRAVENOUS | Status: DC | PRN

## 2024-01-07 MED ORDER — SODIUM CHLORIDE 0.9 % IV SOLN
20.0000 mg | Freq: Once | INTRAVENOUS | Status: AC
  Administered 2024-01-07: 20 mg via INTRAVENOUS
  Filled 2024-01-07: qty 20

## 2024-01-07 MED ORDER — SODIUM CHLORIDE 0.9 % IV SOLN: INTRAVENOUS | Status: DC

## 2024-01-07 MED ORDER — MORPHINE SULFATE (PF) 2 MG/ML IV SOLN
1.0000 mg | Freq: Once | INTRAVENOUS | Status: AC
  Administered 2024-01-07: 1 mg via INTRAVENOUS
  Filled 2024-01-07: qty 1

## 2024-01-07 MED ORDER — FAMOTIDINE IN NACL 20-0.9 MG/50ML-% IV SOLN
20.0000 mg | Freq: Once | INTRAVENOUS | Status: AC
  Administered 2024-01-07: 20 mg via INTRAVENOUS
  Filled 2024-01-07: qty 50

## 2024-01-07 MED ORDER — DIPHENHYDRAMINE HCL 50 MG/ML IJ SOLN
50.0000 mg | Freq: Once | INTRAMUSCULAR | Status: AC
  Administered 2024-01-07: 50 mg via INTRAVENOUS
  Filled 2024-01-07: qty 1

## 2024-01-07 MED ORDER — METHYLPREDNISOLONE SODIUM SUCC 125 MG IJ SOLR
125.0000 mg | Freq: Once | INTRAMUSCULAR | Status: AC | PRN
  Administered 2024-01-07: 125 mg via INTRAVENOUS

## 2024-01-07 MED FILL — Dexamethasone Sodium Phosphate Inj 100 MG/10ML: INTRAMUSCULAR | Qty: 2 | Status: AC

## 2024-01-07 NOTE — Patient Instructions (Signed)
 [facility] - A DEPT OF [facility]  Discharge Instructions: Thank you for choosing [facility] to provide your oncology and hematology care.   If you have a lab appointment with the Cancer Center, please go directly to the Cancer Center and check in at the registration area.   Wear comfortable clothing and clothing appropriate for easy access to any Portacath or PICC line.   We strive to give you quality time with your provider. You may need to reschedule your appointment if you arrive late (15 or more minutes).  Arriving late affects you and other patients whose appointments are after yours.  Also, if you miss three or more appointments without notifying the office, you may be dismissed from the clinic at the provider's discretion.      For prescription refill requests, have your pharmacy contact our office and allow 72 hours for refills to be completed.    Today you received the following chemotherapy and/or immunotherapy agents gazyva       To help prevent nausea and vomiting after your treatment, we encourage you to take your nausea medication as directed.  BELOW ARE SYMPTOMS THAT SHOULD BE REPORTED IMMEDIATELY: *FEVER GREATER THAN 100.4 F (38 C) OR HIGHER *CHILLS OR SWEATING *NAUSEA AND VOMITING THAT IS NOT CONTROLLED WITH YOUR NAUSEA MEDICATION *UNUSUAL SHORTNESS OF BREATH *UNUSUAL BRUISING OR BLEEDING *URINARY PROBLEMS (pain or burning when urinating, or frequent urination) *BOWEL PROBLEMS (unusual diarrhea, constipation, pain near the anus) TENDERNESS IN MOUTH AND THROAT WITH OR WITHOUT PRESENCE OF ULCERS (sore throat, sores in mouth, or a toothache) UNUSUAL RASH, SWELLING OR PAIN  UNUSUAL VAGINAL DISCHARGE OR ITCHING   Items with * indicate a potential emergency and should be followed up as soon as possible or go to the Emergency Department if any problems should occur.  Please show the CHEMOTHERAPY ALERT CARD or IMMUNOTHERAPY  ALERT CARD at check-in to the Emergency Department and triage nurse.  Should you have questions after your visit or need to cancel or reschedule your appointment, please contact [facility] - A DEPT OF [facility]  Dept: [phone]  and follow the prompts.  Office hours are 8:00 a.m. to 4:30 p.m. Monday - Friday. Please note that voicemails left after 4:00 p.m. may not be returned until the following business day.  We are closed weekends and major holidays. You have access to a nurse at all times for urgent questions. Please call the main number to the clinic Dept: [phone] and follow the prompts.   For any non-urgent questions, you may also contact your provider using MyChart. We now offer e-Visits for anyone 57 and older to request care online for non-urgent symptoms. For details visit [email or web address].   Also download the MyChart app! Go to the app store, search "MyChart", open the app, select [facility], and log in with your MyChart username and password.

## 2024-01-07 NOTE — Progress Notes (Signed)
 Pt's WBC: 60. Dr Onesimo aware Pt okay for tx today.

## 2024-01-07 NOTE — Progress Notes (Signed)
 Hypersensitivity Reaction Note  Date of event: 01/07/24  Time of event: 1240   Type of event: Grade 2 (Moderate reaction; Requires therapy or infusion interruption, but responds promptly to interventions; prophylactic medications indicated for <24 hours)    Generic name of drug involved: Obinutuzumab  (Gazyva )  Initial Presentation and Response:  The patient reported and showed signs of rigors/chills during the infusion.   Provider notified of the hypersensitivity reaction: Mallie Combes, PA-C and Dr. Onesimo  Time of provider notification: 1240  Initial Interventions Implemented:   Infusion stopped: Yes  Medications administered - see MAR for sequence and times of administration: Normal Saline 1 L IV, Famotidine  (Pepcid ) 20 mg IV, Methylprednisolone  (Solu-Medrol ) 120 mg IV, and Other (morphine  1mg )  Additional interventions:   Patient response to treatment: Symptoms resolved following interventions.  Remaining Course of Treatment:    The patient was able to restart the medication at half the normal rate for 15 minutes and then was able to complete infusion at full rate.  Was agent that likely caused hypersensitivity reaction added to Allergies List within EMR? Yes   Additional Information Regarding the Chain of Events (including reaction signs/symptoms, treatment administered, and outcome):

## 2024-01-07 NOTE — Progress Notes (Signed)
    DATE:  01/07/24                                        X CHEMO/IMMUNOTHERAPY REACTION             MD: Onesimo   AGENT/BLOOD PRODUCT RECEIVING TODAY:              Gazyva    AGENT/BLOOD PRODUCT RECEIVING IMMEDIATELY PRIOR TO REACTION:          Gazyva     Vitals:   01/07/24 1244  BP: (!) 177/78  Pulse: 83  Resp: 18  SpO2: 98%      REACTION(S):           rigors   PREMEDS:     Pepcid  20 mg IV, Decadron  20 mg IV, Benadryl  50 mg IV, Cetrizine 10 mg IV   INTERVENTION: Pepcid  20 mg IV, solu-medrol  125 mg IV, morphine  1 mg IV   Review of Systems  Review of Systems  Constitutional:  Positive for chills.  All other systems reviewed and are negative.    Physical Exam  Physical Exam Vitals and nursing note reviewed.  Constitutional:      Appearance: She is not ill-appearing or toxic-appearing.     Comments: rigors  HENT:     Head: Normocephalic.  Eyes:     Conjunctiva/sclera: Conjunctivae normal.  Cardiovascular:     Rate and Rhythm: Normal rate and regular rhythm.     Pulses: Normal pulses.     Heart sounds: Normal heart sounds.  Pulmonary:     Effort: Pulmonary effort is normal.     Breath sounds: Normal breath sounds.  Abdominal:     General: There is no distension.  Musculoskeletal:     Cervical back: Normal range of motion.  Skin:    General: Skin is warm and dry.  Neurological:     Mental Status: She is alert.     OUTCOME:                  Patient became symptomatic 1 hour into first time Gazyva  infusion. Emergency medications were administered as documented above. Patient returned to baseline. Oncologist notified and agrees to resume treatment. Patient tolerated remainder of treatment.  I have spent a total of 20 minutes minutes of face-to-face and non-face-to-face time preparing to see the patient, performing a medically appropriate examination, counseling and educating the patient, ordering /medications, documenting clinical information in the electronic  health record, and care coordination.

## 2024-01-08 ENCOUNTER — Other Ambulatory Visit: Payer: Self-pay | Admitting: Hematology

## 2024-01-08 ENCOUNTER — Inpatient Hospital Stay

## 2024-01-08 VITALS — BP 145/59 | HR 81 | Temp 98.3°F | Resp 18 | Ht 66.5 in | Wt 179.2 lb

## 2024-01-08 DIAGNOSIS — Z5112 Encounter for antineoplastic immunotherapy: Secondary | ICD-10-CM | POA: Diagnosis not present

## 2024-01-08 DIAGNOSIS — C9110 Chronic lymphocytic leukemia of B-cell type not having achieved remission: Secondary | ICD-10-CM

## 2024-01-08 LAB — HEPATITIS B CORE ANTIBODY, TOTAL: HEP B CORE AB: NEGATIVE

## 2024-01-08 MED ORDER — ACETAMINOPHEN 325 MG PO TABS
650.0000 mg | ORAL_TABLET | Freq: Once | ORAL | Status: AC
  Administered 2024-01-08: 650 mg via ORAL
  Filled 2024-01-08: qty 2

## 2024-01-08 MED ORDER — FAMOTIDINE IN NACL 20-0.9 MG/50ML-% IV SOLN
20.0000 mg | Freq: Once | INTRAVENOUS | Status: AC
  Administered 2024-01-08: 20 mg via INTRAVENOUS
  Filled 2024-01-08: qty 50

## 2024-01-08 MED ORDER — MORPHINE SULFATE (PF) 2 MG/ML IV SOLN
2.0000 mg | Freq: Once | INTRAVENOUS | Status: AC
  Administered 2024-01-08: 2 mg via INTRAVENOUS
  Filled 2024-01-08: qty 1

## 2024-01-08 MED ORDER — SODIUM CHLORIDE 0.9 % IV SOLN: INTRAVENOUS | Status: DC

## 2024-01-08 MED ORDER — DIPHENHYDRAMINE HCL 50 MG/ML IJ SOLN
50.0000 mg | Freq: Once | INTRAMUSCULAR | Status: AC
  Administered 2024-01-08: 50 mg via INTRAVENOUS
  Filled 2024-01-08: qty 1

## 2024-01-08 MED ORDER — MONTELUKAST SODIUM 10 MG PO TABS
10.0000 mg | ORAL_TABLET | Freq: Once | ORAL | Status: AC
  Administered 2024-01-08: 10 mg via ORAL
  Filled 2024-01-08: qty 1

## 2024-01-08 MED ORDER — SODIUM CHLORIDE 0.9 % IV SOLN
900.0000 mg | Freq: Once | INTRAVENOUS | Status: AC
  Administered 2024-01-08: 900 mg via INTRAVENOUS
  Filled 2024-01-08: qty 36

## 2024-01-08 MED ORDER — SODIUM CHLORIDE 0.9 % IV SOLN
20.0000 mg | Freq: Once | INTRAVENOUS | Status: AC
  Administered 2024-01-08: 20 mg via INTRAVENOUS
  Filled 2024-01-08: qty 20

## 2024-01-08 NOTE — Patient Instructions (Signed)
 [facility] - A DEPT OF [facility]  Discharge Instructions: Thank you for choosing [facility] to provide your oncology and hematology care.   If you have a lab appointment with the Cancer Center, please go directly to the Cancer Center and check in at the registration area.   Wear comfortable clothing and clothing appropriate for easy access to any Portacath or PICC line.   We strive to give you quality time with your provider. You may need to reschedule your appointment if you arrive late (15 or more minutes).  Arriving late affects you and other patients whose appointments are after yours.  Also, if you miss three or more appointments without notifying the office, you may be dismissed from the clinic at the provider's discretion.      For prescription refill requests, have your pharmacy contact our office and allow 72 hours for refills to be completed.    Today you received the following chemotherapy and/or immunotherapy agents gazyva       To help prevent nausea and vomiting after your treatment, we encourage you to take your nausea medication as directed.  BELOW ARE SYMPTOMS THAT SHOULD BE REPORTED IMMEDIATELY: *FEVER GREATER THAN 100.4 F (38 C) OR HIGHER *CHILLS OR SWEATING *NAUSEA AND VOMITING THAT IS NOT CONTROLLED WITH YOUR NAUSEA MEDICATION *UNUSUAL SHORTNESS OF BREATH *UNUSUAL BRUISING OR BLEEDING *URINARY PROBLEMS (pain or burning when urinating, or frequent urination) *BOWEL PROBLEMS (unusual diarrhea, constipation, pain near the anus) TENDERNESS IN MOUTH AND THROAT WITH OR WITHOUT PRESENCE OF ULCERS (sore throat, sores in mouth, or a toothache) UNUSUAL RASH, SWELLING OR PAIN  UNUSUAL VAGINAL DISCHARGE OR ITCHING   Items with * indicate a potential emergency and should be followed up as soon as possible or go to the Emergency Department if any problems should occur.  Please show the CHEMOTHERAPY ALERT CARD or IMMUNOTHERAPY  ALERT CARD at check-in to the Emergency Department and triage nurse.  Should you have questions after your visit or need to cancel or reschedule your appointment, please contact [facility] - A DEPT OF [facility]  Dept: [phone]  and follow the prompts.  Office hours are 8:00 a.m. to 4:30 p.m. Monday - Friday. Please note that voicemails left after 4:00 p.m. may not be returned until the following business day.  We are closed weekends and major holidays. You have access to a nurse at all times for urgent questions. Please call the main number to the clinic Dept: [phone] and follow the prompts.   For any non-urgent questions, you may also contact your provider using MyChart. We now offer e-Visits for anyone 57 and older to request care online for non-urgent symptoms. For details visit [email or web address].   Also download the MyChart app! Go to the app store, search "MyChart", open the app, select [facility], and log in with your MyChart username and password.

## 2024-01-14 MED FILL — Dexamethasone Sodium Phosphate Inj 100 MG/10ML: INTRAMUSCULAR | Qty: 2 | Status: AC

## 2024-01-15 ENCOUNTER — Encounter: Payer: Self-pay | Admitting: Hematology

## 2024-01-15 ENCOUNTER — Inpatient Hospital Stay

## 2024-01-15 ENCOUNTER — Inpatient Hospital Stay (HOSPITAL_BASED_OUTPATIENT_CLINIC_OR_DEPARTMENT_OTHER): Admitting: Physician Assistant

## 2024-01-15 VITALS — BP 122/66 | HR 90 | Temp 98.0°F | Resp 18 | Wt 174.4 lb

## 2024-01-15 DIAGNOSIS — C9110 Chronic lymphocytic leukemia of B-cell type not having achieved remission: Secondary | ICD-10-CM | POA: Diagnosis not present

## 2024-01-15 DIAGNOSIS — Z5112 Encounter for antineoplastic immunotherapy: Secondary | ICD-10-CM | POA: Diagnosis not present

## 2024-01-15 DIAGNOSIS — R0781 Pleurodynia: Secondary | ICD-10-CM | POA: Diagnosis not present

## 2024-01-15 LAB — CMP (CANCER CENTER ONLY)
ALT: 55 U/L — ABNORMAL HIGH (ref 0–44)
AST: 28 U/L (ref 15–41)
Albumin: 3.4 g/dL — ABNORMAL LOW (ref 3.5–5.0)
Alkaline Phosphatase: 81 U/L (ref 38–126)
Anion gap: 5 (ref 5–15)
BUN: 18 mg/dL (ref 8–23)
CO2: 27 mmol/L (ref 22–32)
Calcium: 8.7 mg/dL — ABNORMAL LOW (ref 8.9–10.3)
Chloride: 107 mmol/L (ref 98–111)
Creatinine: 0.93 mg/dL (ref 0.44–1.00)
GFR, Estimated: >60 mL/min (ref >60)
Glucose, Bld: 127 mg/dL — ABNORMAL HIGH (ref 70–99)
Potassium: 4 mmol/L (ref 3.5–5.1)
Sodium: 139 mmol/L (ref 135–145)
Total Bilirubin: 0.6 mg/dL (ref 0.0–1.2)
Total Protein: 5.5 g/dL — ABNORMAL LOW (ref 6.5–8.1)

## 2024-01-15 LAB — CBC WITH DIFFERENTIAL (CANCER CENTER ONLY)
Abs Immature Granulocytes: 0.01 K/uL (ref 0.00–0.07)
Basophils Absolute: 0 K/uL (ref 0.0–0.1)
Basophils Relative: 1 %
Eosinophils Absolute: 0 K/uL (ref 0.0–0.5)
Eosinophils Relative: 1 %
HCT: 25.9 % — ABNORMAL LOW (ref 36.0–46.0)
Hemoglobin: 8.6 g/dL — ABNORMAL LOW (ref 12.0–15.0)
Immature Granulocytes: 1 %
Lymphocytes Relative: 41 %
Lymphs Abs: 0.5 K/uL — ABNORMAL LOW (ref 0.7–4.0)
MCH: 33.1 pg (ref 26.0–34.0)
MCHC: 33.2 g/dL (ref 30.0–36.0)
MCV: 99.6 fL (ref 80.0–100.0)
Monocytes Absolute: 0.1 K/uL (ref 0.1–1.0)
Monocytes Relative: 4 %
Neutro Abs: 0.7 K/uL — ABNORMAL LOW (ref 1.7–7.7)
Neutrophils Relative %: 52 %
Platelet Count: 172 K/uL (ref 150–400)
RBC: 2.6 MIL/uL — ABNORMAL LOW (ref 3.87–5.11)
RDW: 14.9 % (ref 11.5–15.5)
WBC Count: 1.3 K/uL — ABNORMAL LOW (ref 4.0–10.5)
nRBC: 0 % (ref 0.0–0.2)

## 2024-01-15 LAB — URIC ACID: Uric Acid, Serum: 4.9 mg/dL (ref 2.5–7.1)

## 2024-01-15 LAB — LACTATE DEHYDROGENASE: LDH: 287 U/L — ABNORMAL HIGH (ref 98–192)

## 2024-01-15 MED ORDER — SODIUM CHLORIDE 0.9 % IV SOLN
20.0000 mg | Freq: Once | INTRAVENOUS | Status: AC
  Administered 2024-01-15: 20 mg via INTRAVENOUS
  Filled 2024-01-15: qty 20

## 2024-01-15 MED ORDER — MONTELUKAST SODIUM 10 MG PO TABS
10.0000 mg | ORAL_TABLET | Freq: Once | ORAL | Status: AC
  Administered 2024-01-15: 10 mg via ORAL
  Filled 2024-01-15: qty 1

## 2024-01-15 MED ORDER — FAMOTIDINE IN NACL 20-0.9 MG/50ML-% IV SOLN
20.0000 mg | Freq: Once | INTRAVENOUS | Status: AC
  Administered 2024-01-15: 20 mg via INTRAVENOUS
  Filled 2024-01-15: qty 50

## 2024-01-15 MED ORDER — DIPHENHYDRAMINE HCL 50 MG/ML IJ SOLN
50.0000 mg | Freq: Once | INTRAMUSCULAR | Status: AC
  Administered 2024-01-15: 50 mg via INTRAVENOUS
  Filled 2024-01-15: qty 1

## 2024-01-15 MED ORDER — ACETAMINOPHEN 325 MG PO TABS
650.0000 mg | ORAL_TABLET | Freq: Once | ORAL | Status: AC
  Administered 2024-01-15: 650 mg via ORAL
  Filled 2024-01-15: qty 2

## 2024-01-15 MED ORDER — SODIUM CHLORIDE 0.9 % IV SOLN: INTRAVENOUS | Status: DC

## 2024-01-15 MED ORDER — SODIUM CHLORIDE 0.9 % IV SOLN
1000.0000 mg | Freq: Once | INTRAVENOUS | Status: AC
  Administered 2024-01-15: 1000 mg via INTRAVENOUS
  Filled 2024-01-15: qty 40

## 2024-01-15 MED ORDER — MORPHINE SULFATE (PF) 2 MG/ML IV SOLN
2.0000 mg | Freq: Once | INTRAVENOUS | Status: AC
  Administered 2024-01-15: 2 mg via INTRAVENOUS
  Filled 2024-01-15: qty 1

## 2024-01-15 NOTE — Telephone Encounter (Signed)
 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 Venclexta  dose will be titrated up per manufacturer recommendations. Daily dosing of Venclexta  will be continued based on disease response and toleration of therapy. Patient is having a good response from Gazyva , we will be see if patient will need to add the venetoclax  at the second cycle with a discussion with Dr. Onesimo.   Treatment goal: Control  Counseled patient on administration, dosing, side effects, monitoring, drug-food interactions, safe handling, storage, and disposal.  Patient will take Venclexta  10mg  tablets, 2 tablets (20 mg) by mouth once daily with food and water for 7 days. Patient will increase per starter pack each week to 50mg , 100mg , and 200mg . Patient is already on allopurinol  and will continue taking the medication. Patient is staying hydrated.  Baseline labs will be assessed and the 1st dose will be administered.  Venclexta  start date: pending start date  Adverse effects include but are not limited to: TLS, decreased blood counts, electrolyte abnormalities, diarrhea, nausea, fatigue, arthralgias/myalgias, and upper respiratory tract infection.    Patient will obtain anti diarrheal and alert the office of 4 or more loose stools above baseline. Patient already has nausea medications and will take as needed if she develops nausea.   Reviewed with patient importance of keeping a medication schedule and plan for any missed doses. No barriers to medication adherence identified.  Medication reconciliation performed and medication/allergy list updated.  Distress thermometer not completed during telephone call as patient has been on previous lines of therapy.   Communication and Learning Assessment Primary learner: Patient and patients husband Barriers to  learning: No barriers Preferred language: English Learning preferences: Listening Reading  All questions answered. Patient and patients husband voiced understanding and appreciation.   Medication education handout given to patient in the infusion room. Patient knows to call the office with questions or concerns. Oral Chemotherapy Clinic phone number provided to patient.   Charli Liberatore, PharmD Hematology/Oncology Clinical Pharmacist [place] [facility] [phone] 01/15/2024   11:31 AM

## 2024-01-15 NOTE — Telephone Encounter (Signed)
 Oral Oncology Patient Advocate Encounter  Nicole Campbell will call when she would like to fill it - she had great response from the gazyva  so may not be needed, she will find out on 10/8    Nicole Campbell, CPhT [place]  [facility] Pharmacy Technician Patient Advocate Specialist II Nicole Campbell Phone: [phone]  Fax: [phone] [email or web address]

## 2024-01-15 NOTE — Patient Instructions (Signed)
 [facility] - A DEPT OF [facility]  Discharge Instructions: Thank you for choosing [facility] to provide your oncology and hematology care.   If you have a lab appointment with the Cancer Center, please go directly to the Cancer Center and check in at the registration area.   Wear comfortable clothing and clothing appropriate for easy access to any Portacath or PICC line.   We strive to give you quality time with your provider. You may need to reschedule your appointment if you arrive late (15 or more minutes).  Arriving late affects you and other patients whose appointments are after yours.  Also, if you miss three or more appointments without notifying the office, you may be dismissed from the clinic at the provider's discretion.      For prescription refill requests, have your pharmacy contact our office and allow 72 hours for refills to be completed.    Today you received the following chemotherapy and/or immunotherapy agents gazyva       To help prevent nausea and vomiting after your treatment, we encourage you to take your nausea medication as directed.  BELOW ARE SYMPTOMS THAT SHOULD BE REPORTED IMMEDIATELY: *FEVER GREATER THAN 100.4 F (38 C) OR HIGHER *CHILLS OR SWEATING *NAUSEA AND VOMITING THAT IS NOT CONTROLLED WITH YOUR NAUSEA MEDICATION *UNUSUAL SHORTNESS OF BREATH *UNUSUAL BRUISING OR BLEEDING *URINARY PROBLEMS (pain or burning when urinating, or frequent urination) *BOWEL PROBLEMS (unusual diarrhea, constipation, pain near the anus) TENDERNESS IN MOUTH AND THROAT WITH OR WITHOUT PRESENCE OF ULCERS (sore throat, sores in mouth, or a toothache) UNUSUAL RASH, SWELLING OR PAIN  UNUSUAL VAGINAL DISCHARGE OR ITCHING   Items with * indicate a potential emergency and should be followed up as soon as possible or go to the Emergency Department if any problems should occur.  Please show the CHEMOTHERAPY ALERT CARD or IMMUNOTHERAPY  ALERT CARD at check-in to the Emergency Department and triage nurse.  Should you have questions after your visit or need to cancel or reschedule your appointment, please contact [facility] - A DEPT OF [facility]  Dept: [phone]  and follow the prompts.  Office hours are 8:00 a.m. to 4:30 p.m. Monday - Friday. Please note that voicemails left after 4:00 p.m. may not be returned until the following business day.  We are closed weekends and major holidays. You have access to a nurse at all times for urgent questions. Please call the main number to the clinic Dept: [phone] and follow the prompts.   For any non-urgent questions, you may also contact your provider using MyChart. We now offer e-Visits for anyone 57 and older to request care online for non-urgent symptoms. For details visit [email or web address].   Also download the MyChart app! Go to the app store, search "MyChart", open the app, select [facility], and log in with your MyChart username and password.

## 2024-01-16 ENCOUNTER — Ambulatory Visit (HOSPITAL_COMMUNITY)
Admission: RE | Admit: 2024-01-16 | Discharge: 2024-01-16 | Disposition: A | Discharge status: Home / Self Care | Source: Ambulatory Visit | Attending: Physician Assistant | Admitting: Physician Assistant

## 2024-01-16 DIAGNOSIS — R0781 Pleurodynia: Secondary | ICD-10-CM | POA: Diagnosis not present

## 2024-01-16 MED ORDER — IOHEXOL 300 MG/ML  SOLN
75.0000 mL | Freq: Once | INTRAMUSCULAR | Status: AC | PRN
  Administered 2024-01-16: 75 mL via INTRAVENOUS

## 2024-01-17 ENCOUNTER — Other Ambulatory Visit: Payer: Self-pay

## 2024-01-19 ENCOUNTER — Encounter: Payer: Self-pay | Admitting: Hematology

## 2024-01-19 NOTE — Progress Notes (Signed)
 HEMATOLOGY/ONCOLOGY CLINIC NOTE  Date of Service: 01/15/2024  Patient Care Team: Nicole Pellet, MD as PCP - General (Family Medicine) Nicole Oliphant, MD as Consulting Physician (Otolaryngology)  CHIEF COMPLAINTS/PURPOSE OF CONSULTATION:  Follow-up for continued valuation and management of CLL and follow-up for biopsy of right flank mass  Prior therapy:    She is status post rituximab  weekly for 4 weeks completed on August 21, 2018. She is status post thoracentesis completed on August 12, 2019 with positive cytology. Rituximab  375 mg per metered square weekly started on October 13 of 2020 and completed on 03/04/2019.  This will be repeated every 3 months last treatment completed in March 2021. Bendamustine  and rituximab  started on Sep 03, 2019.  She is here for cycle 3 of therapy.   INTERVAL HISTORY:  Nicole Campbell is a 78 y.o. female who is here for continued evaluation and management of her CLL.  Nicole Campbell reports her energy and appetite are overall stable. She does have new onset anterior rib pain on the left side. She reports the pain has been present for the last few days. She rates the pain as 5 out of 10 on a pain scale. She denies nausea, vomiting or bowel habit changes. She denies easy bruising or signs of bleeding. She denies fevers, chills, sweats, shortness of breath ,chest pain or cough. She has no other complaints.  MEDICAL HISTORY:  Past Medical History:  Diagnosis Date   CLL (chronic lymphocytic leukemia) (HCC)    Hypercalcemia    Hypertension    Hypothyroid    MVP (mitral valve prolapse)    Rheumatoid arthritis (HCC)    Shingles     SURGICAL HISTORY: Past Surgical History:  Procedure Laterality Date   ABDOMINAL HYSTERECTOMY     IR THORACENTESIS ASP PLEURAL SPACE W/IMG GUIDE  11/21/2019   IR THORACENTESIS ASP PLEURAL SPACE W/IMG GUIDE  07/05/2020   TONSILLECTOMY      SOCIAL HISTORY: Social History   Socioeconomic History   Marital status:  Married    Spouse name: Not on file   Number of children: 3   Years of education: Not on file   Highest education level: Not on file  Occupational History   Not on file  Tobacco Use   Smoking status: Never   Smokeless tobacco: Never  Vaping Use   Vaping status: Never Used  Substance and Sexual Activity   Alcohol use: Yes    Comment: Rare   Drug use: Never   Sexual activity: Not on file  Other Topics Concern   Not on file  Social History Narrative   Not on file   Social Drivers of Health   Financial Resource Strain: Not on file  Food Insecurity: No Food Insecurity (03/16/2023)   Hunger Vital Sign    Worried About Running Out of Food in the Last Year: Never true    Ran Out of Food in the Last Year: Never true  Transportation Needs: No Transportation Needs (03/16/2023)   PRAPARE - Administrator, Civil Service (Medical): No    Lack of Transportation (Non-Medical): No  Physical Activity: Not on file  Stress: Not on file  Social Connections: Not on file  Intimate Partner Violence: Not At Risk (03/16/2023)   Humiliation, Afraid, Rape, and Kick questionnaire    Fear of Current or Ex-Partner: No    Emotionally Abused: No    Physically Abused: No    Sexually Abused: No    FAMILY HISTORY: Family  History  Problem Relation Age of Onset   Congestive Heart Failure Father     ALLERGIES:  is allergic to gazyva  [obinutuzumab ], methotrexate, and erythromycin.  MEDICATIONS:  Current Outpatient Medications  Medication Sig Dispense Refill   acetaminophen  (TYLENOL ) 650 MG CR tablet Take 1,300 mg by mouth every 8 (eight) hours as needed for pain.     acyclovir  (ZOVIRAX ) 400 MG tablet Take 1 tablet (400 mg total) by mouth daily. 30 tablet 5   allopurinol  (ZYLOPRIM ) 100 MG tablet Take 1 tablet (100 mg total) by mouth daily. 60 tablet 2   escitalopram  (LEXAPRO ) 5 MG tablet Take 5 mg by mouth daily.     gabapentin  (NEURONTIN ) 300 MG capsule Take 1 capsule (300 mg total) by  mouth 2 (two) times daily. Do not drive or operate heavy machinery till you know how this affects you. Can cause some sedation. 60 capsule 1   levothyroxine  (SYNTHROID , LEVOTHROID) 25 MCG tablet Take 25 mcg by mouth daily before breakfast.      ondansetron  (ZOFRAN ) 8 MG tablet Take 1 tablet (8 mg total) by mouth every 8 (eight) hours as needed for nausea or vomiting. 30 tablet 1   ondansetron  (ZOFRAN -ODT) 4 MG disintegrating tablet Take 1 tablet (4 mg total) by mouth every 8 (eight) hours as needed for nausea or vomiting. 20 tablet 0   oxyCODONE -acetaminophen  (PERCOCET) 5-325 MG tablet Take 1 tablet by mouth every 4 (four) hours as needed for moderate pain (pain score 4-6). 20 tablet 0   prochlorperazine  (COMPAZINE ) 10 MG tablet Take 1 tablet (10 mg total) by mouth every 6 (six) hours as needed for nausea or vomiting. 30 tablet 1   triamcinolone  (KENALOG ) 0.025 % ointment Apply 1 Application topically 2 (two) times daily. Apply to eczematoid rash on lower extremities 30 g 1   valACYclovir  (VALTREX ) 500 MG tablet Take 1,000 mg by mouth 3 (three) times daily.     venetoclax  (VENCLEXTA ) 10 & 50 & 100 MG Starter Pack Take by mouth daily. Take 20 mg for 7 days, then 50 mg daily x 7d, then 100 mg daily x 7d, then 200 mg daily x 7d. Take with food & water. 42 tablet 0   No current facility-administered medications for this visit.    REVIEW OF SYSTEMS:   .10 Point review of Systems was done is negative except as noted above.  PHYSICAL EXAMINATION: ECOG PERFORMANCE STATUS: 1 - Symptomatic but completely ambulatory   Day 8, Cycle 1 01/15/24  Weight 174 lb 6.4 oz (79.1 kg)  Temp 98 F (36.7 C)  Temp src Oral  Pulse 90  Resp 18  BP 122/66   GENERAL:alert, in no acute distress and comfortable SKIN: no acute rashes, no significant lesions EYES: conjunctiva are pink and non-injected, sclera anicteric LUNGS: clear to auscultation b/l with normal respiratory effort HEART: regular rate &  rhythm Extremity: no pedal edema MSK: Tenderness to palpation in left, anterior rib pain (6th-7th) PSYCH: alert & oriented x 3 with fluent speech NEURO: no focal motor/sensory deficits   LABORATORY DATA:  I have reviewed the data as listed  .    Latest Ref Rng & Units 01/15/2024    8:55 AM 01/07/2024    8:24 AM 11/20/2023   11:59 AM  CBC  WBC 4.0 - 10.5 K/uL 1.3  60.0  38.7   Hemoglobin 12.0 - 15.0 g/dL 8.6  9.8  88.8   Hematocrit 36.0 - 46.0 % 25.9  29.5  33.2   Platelets 150 -  400 K/uL 172  179  190    .    Latest Ref Rng & Units 01/15/2024    8:55 AM 01/07/2024    8:24 AM 11/20/2023   11:59 AM  CMP  Glucose 70 - 99 mg/dL 872  850  95   BUN 8 - 23 mg/dL 18  21  20    Creatinine 0.44 - 1.00 mg/dL 9.06  8.79  8.87   Sodium 135 - 145 mmol/L 139  140  140   Potassium 3.5 - 5.1 mmol/L 4.0  3.9  4.3   Chloride 98 - 111 mmol/L 107  106  108   CO2 22 - 32 mmol/L 27  27  26    Calcium 8.9 - 10.3 mg/dL 8.7  9.2  8.7   Total Protein 6.5 - 8.1 g/dL 5.5  6.0  5.9   Total Bilirubin 0.0 - 1.2 mg/dL 0.6  0.6  0.5   Alkaline Phos 38 - 126 U/L 81  71  62   AST 15 - 41 U/L 28  15  17    ALT 0 - 44 U/L 55  12  13    . Lab Results  Component Value Date   LDH 287 (H) 01/15/2024   SURGICAL PATHOLOGY CASE: [id number] PATIENT: Nicole Campbell Surgical Pathology Report     Clinical History: left flank mass, Q CLL (tb)     FINAL MICROSCOPIC DIAGNOSIS:  A. 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 Immunohistochemical stains reveal the neoplastic cells are positive for CD20, CD5 and CD23. The larger cells show positivity for cyclin D1. The ki-67 proliferation index is overall low with increase in focal areas, likely consistent with  proliferation centers. No sheets of large cells are noted and transformation to a diffuse large B cell lymphoma is considered much less likely. However, this evaluation is based on the needle core biopsy and the possibility of transformation in other areas cannot be completely excluded. Clinical and imaging correlation is recommended.  Legend: (L) Low (H) High RADIOGRAPHIC STUDIES: I have personally reviewed the radiological images as listed and agreed with the findings in the report. CT Chest W Contrast Result Date: 01/16/2024 CLINICAL DATA:  Chronic lymphocytic leukemia. LEFT anterior rib pain. Concern for malignancy. * Tracking Code: BO * EXAM: CT CHEST WITH CONTRAST TECHNIQUE: Multidetector CT imaging of the chest was performed during intravenous contrast administration. RADIATION DOSE REDUCTION: This exam was performed according to the departmental dose-optimization program which includes automated exposure control, adjustment of the mA and/or kV according to patient size and/or use of iterative reconstruction technique. CONTRAST:  75mL OMNIPAQUE  IOHEXOL  300 MG/ML  SOLN COMPARISON:  None Available. FINDINGS: Cardiovascular: No significant vascular findings. Normal heart size. No pericardial effusion. Mediastinum/Nodes: No axillary or supraclavicular adenopathy. No mediastinal or hilar adenopathy. No pericardial fluid. Esophagus normal. Lungs/Pleura: Benign appearing pleuroparenchymal thickening of the LEFT and RIGHT lung apex. No suspicious pulmonary nodules. Limited view of the liver, kidneys, pancreas are unremarkable. Normal adrenal glands. Upper Abdomen: Limited view of the liver, kidneys, pancreas are unremarkable. Normal adrenal glands. Musculoskeletal: No aggressive lesion identified in LEFT chest wall. No fracture or rib destruction. No soft tissue abnormality identified. IMPRESSION: 1. No significant LEFT chest wall abnormality identified. 2. No suspicious pulmonary nodules. 3. No  mediastinal lymphadenopathy. Electronically  Signed   By: Jackquline Boxer M.D.   On: 01/16/2024 17:26     ASSESSMENT & PLAN:  Nicole Campbell is a 78 y.o. female who presents for continued management of CLL.   1.  CLL presented with lymphocytosis and adenopathy diagnosed in 2019. 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  Rheumatoid arthritis: She remains without any recent exacerbation.  3.  Bullous lesions on her upper and lower extremities: Possibly related to skin reaction associated with Irbutinib or the patient autoimmune disorder (RA)- much improved with topical steroids.  PLAN: --Due for cycle 1, Day 8 of Gazyva  today --Labs from today were reviewed with patient. WBC 1.3, Hgb 8.6, Plt 172, creatinine normal, mild elevation of ALT 55.  --Discussed with Dr. Federico due to neutropenia, who recommends to continue with treatment today without any dose modifications.  --Will obtain CT chest to evaluate palpable anterior left rib pain.  --Continue Allopurinol  100 mg p.o. twice daily for TLS prophylaxis   FOLLOW-UP: RTC next week for labs and follow up prior to Cycle 1, Day 15 of Gazyva .   All of the patient's questions were answered with apparent satisfaction. The patient knows to call the clinic with any problems, questions or concerns.   I have spent a total of 30 minutes minutes of face-to-face and non-face-to-face time, preparing to see the patient,performing a medically appropriate examination, counseling and educating the patient, ordering medications/tests/procedures, documenting clinical information in the electronic health record, independently interpreting results and communicating results to the patient, and care coordination.   [place] Police PA-C Dept of Hematology and Oncology [facility] Phone: [phone]

## 2024-01-21 ENCOUNTER — Ambulatory Visit: Payer: Self-pay

## 2024-01-21 NOTE — Telephone Encounter (Signed)
-----   Message from [place] Police sent at 01/20/2024 12:32 PM EDT ----- Please notify patient that CT chest was negative for any abnormality. Please follow up on her pain involving her left lower rib area.   ----- Message ----- From: Interface, Rad Results In Sent: 01/16/2024   5:28 PM EDT To: [place] Police, PA-C

## 2024-01-21 NOTE — Telephone Encounter (Signed)
 Pt advised of CT results and her pain has resolved.

## 2024-01-22 MED FILL — Dexamethasone Sodium Phosphate Inj 100 MG/10ML: INTRAMUSCULAR | Qty: 2 | Status: AC

## 2024-01-23 ENCOUNTER — Inpatient Hospital Stay

## 2024-01-23 ENCOUNTER — Inpatient Hospital Stay (HOSPITAL_BASED_OUTPATIENT_CLINIC_OR_DEPARTMENT_OTHER): Admitting: Hematology

## 2024-01-23 VITALS — BP 132/59 | HR 97 | Temp 97.0°F | Resp 20 | Wt 173.0 lb

## 2024-01-23 DIAGNOSIS — D61818 Other pancytopenia: Secondary | ICD-10-CM

## 2024-01-23 DIAGNOSIS — C9110 Chronic lymphocytic leukemia of B-cell type not having achieved remission: Secondary | ICD-10-CM

## 2024-01-23 DIAGNOSIS — Z5112 Encounter for antineoplastic immunotherapy: Secondary | ICD-10-CM | POA: Diagnosis not present

## 2024-01-23 LAB — CBC WITH DIFFERENTIAL (CANCER CENTER ONLY)
Abs Immature Granulocytes: 0.01 K/uL (ref 0.00–0.07)
Basophils Absolute: 0 K/uL (ref 0.0–0.1)
Basophils Relative: 1 %
Eosinophils Absolute: 0 K/uL (ref 0.0–0.5)
Eosinophils Relative: 2 %
HCT: 24.4 % — ABNORMAL LOW (ref 36.0–46.0)
Hemoglobin: 8.2 g/dL — ABNORMAL LOW (ref 12.0–15.0)
Immature Granulocytes: 1 %
Lymphocytes Relative: 61 %
Lymphs Abs: 1 K/uL (ref 0.7–4.0)
MCH: 34 pg (ref 26.0–34.0)
MCHC: 33.6 g/dL (ref 30.0–36.0)
MCV: 101.2 fL — ABNORMAL HIGH (ref 80.0–100.0)
Monocytes Absolute: 0 K/uL — ABNORMAL LOW (ref 0.1–1.0)
Monocytes Relative: 1 %
Neutro Abs: 0.6 K/uL — ABNORMAL LOW (ref 1.7–7.7)
Neutrophils Relative %: 34 %
Platelet Count: 243 K/uL (ref 150–400)
RBC: 2.41 MIL/uL — ABNORMAL LOW (ref 3.87–5.11)
RDW: 14.8 % (ref 11.5–15.5)
Smear Review: NORMAL
WBC Count: 1.7 K/uL — ABNORMAL LOW (ref 4.0–10.5)
nRBC: 0 % (ref 0.0–0.2)

## 2024-01-23 LAB — URIC ACID: Uric Acid, Serum: 4.4 mg/dL (ref 2.5–7.1)

## 2024-01-23 LAB — CMP (CANCER CENTER ONLY)
ALT: 16 U/L (ref 0–44)
AST: 14 U/L — ABNORMAL LOW (ref 15–41)
Albumin: 3.4 g/dL — ABNORMAL LOW (ref 3.5–5.0)
Alkaline Phosphatase: 68 U/L (ref 38–126)
Anion gap: 5 (ref 5–15)
BUN: 14 mg/dL (ref 8–23)
CO2: 26 mmol/L (ref 22–32)
Calcium: 8.5 mg/dL — ABNORMAL LOW (ref 8.9–10.3)
Chloride: 108 mmol/L (ref 98–111)
Creatinine: 0.98 mg/dL (ref 0.44–1.00)
GFR, Estimated: 59 mL/min — ABNORMAL LOW (ref >60)
Glucose, Bld: 132 mg/dL — ABNORMAL HIGH (ref 70–99)
Potassium: 4 mmol/L (ref 3.5–5.1)
Sodium: 139 mmol/L (ref 135–145)
Total Bilirubin: 0.7 mg/dL (ref 0.0–1.2)
Total Protein: 5.3 g/dL — ABNORMAL LOW (ref 6.5–8.1)

## 2024-01-23 LAB — LACTATE DEHYDROGENASE: LDH: 167 U/L (ref 98–192)

## 2024-01-23 NOTE — Progress Notes (Signed)
 HEMATOLOGY ONCOLOGY PROGRESS NOTE  Date of service: 01/23/2024  Patient Care Team: Claudene Pellet, MD as PCP - General (Family Medicine) Jesus Oliphant, MD as Consulting Physician (Otolaryngology)  CHIEF COMPLAINTS/PURPOSE OF CONSULTATION:  Follow-up for continued valuation and management of CLL  Prior therapy:    She is status post rituximab  weekly for 4 weeks completed on August 21, 2018. She is status post thoracentesis completed on August 12, 2019 with positive cytology. Rituximab  375 mg per metered square weekly started on October 13 of 2020 and completed on 03/04/2019.  This will be repeated every 3 months last treatment completed in March 2021. Bendamustine  and rituximab  started on Sep 03, 2019.  She is here for cycle 3 of therapy.   HISTORY OF PRESENTING ILLNESS:  Nicole Campbell is a wonderful 78 y.o. female who is here for continued evaluation and management of CLL. Patient was following up with Dr. Amadeo and has transferried her care to me. She was first diagnosed with CLL in 2019 where she presented with leukocytosis with p53 deletion.    Patient's current treatment is Ibrutinib  420 mg every other day. She was first started on Ibrutinib  daily in July 2020, but was switched to Ibrutinib  every other day in September 2023 due to presumed dermatological toxicity.  Patient notes that she was getting insect bites which was causing her bruising and scaring in September 2023, which is the reason Dr. Amadeo changed her treatment to Ibrutinib  to every other day.   SUMMARY OF ONCOLOGIC HISTORY: Oncology History  CLL (chronic lymphocytic leukemia) (HCC)  07/10/2018 Initial Diagnosis   CLL (chronic lymphocytic leukemia) (HCC)   07/31/2018 - 07/09/2019 Chemotherapy   The patient had riTUXimab  (RITUXAN ) 700 mg in sodium chloride  0.9 % 250 mL (2.1875 mg/mL) infusion, 375 mg/m2 = 700 mg, Intravenous,  Once, 12 of 16 cycles Administration: 700 mg (07/31/2018), 700 mg (08/07/2018), 700 mg  (08/14/2018), 700 mg (08/21/2018), 700 mg (02/11/2019), 700 mg (02/18/2019), 700 mg (02/25/2019), 700 mg (03/04/2019), 700 mg (06/18/2019), 700 mg (06/25/2019), 700 mg (07/02/2019), 700 mg (07/09/2019)  for chemotherapy treatment.    09/03/2019 - 11/25/2019 Chemotherapy   Patient is on Treatment Plan : NON-HODGKINS LYMPHOMA Rituximab  D1 / Bendamustine  D1,2 q28d     01/07/2024 -  Chemotherapy   Patient is on Treatment Plan : LYMPHOMA CLL/SLL Venetoclax  + Obinutuzumab  q28d      INTERVAL HISTORY:  Nicole Campbell is a 78 y.o. female who is here today for continued evaluation and management of CLL.   She was last seen by me on 12/26/2023 and at that time noted she was still experiencing intermittent bullous skin lesions.  Today, she says that she is feeling good overall, is mildly fatigued, but no pain. She is on Cycle 1 / day 15 of her  Obinutuzumab  q28d with her next infusion cycle 2-day 1 being 10/09 . She notes the lump in her right flank has been decreasing in size. Denies new lesions/sores, changes in bowel habits, or sore throat. One thing she has noticed is early satiety, but is eating a variety of foods.   Vaccine Counseling: PNA updated; still needing Flu, Covid-19, Shingrix  REVIEW OF SYSTEMS:    10 Point review of systems of done and is negative except as noted above.  . Past Medical History:  Diagnosis Date   CLL (chronic lymphocytic leukemia) (HCC)    Hypercalcemia    Hypertension    Hypothyroid    MVP (mitral valve prolapse)    Rheumatoid arthritis (  HCC)    Shingles     . Past Surgical History:  Procedure Laterality Date   ABDOMINAL HYSTERECTOMY     IR THORACENTESIS ASP PLEURAL SPACE W/IMG GUIDE  11/21/2019   IR THORACENTESIS ASP PLEURAL SPACE W/IMG GUIDE  07/05/2020   TONSILLECTOMY      . Social History   Tobacco Use   Smoking status: Never   Smokeless tobacco: Never  Vaping Use   Vaping status: Never Used  Substance Use Topics   Alcohol use: Yes    Comment: Rare    Drug use: Never    ALLERGIES:  is allergic to gazyva  [obinutuzumab ], methotrexate, and erythromycin.  MEDICATIONS:  Current Outpatient Medications  Medication Sig Dispense Refill   acetaminophen  (TYLENOL ) 650 MG CR tablet Take 1,300 mg by mouth every 8 (eight) hours as needed for pain.     acyclovir  (ZOVIRAX ) 400 MG tablet Take 1 tablet (400 mg total) by mouth daily. 30 tablet 5   allopurinol  (ZYLOPRIM ) 100 MG tablet Take 1 tablet (100 mg total) by mouth daily. 60 tablet 2   escitalopram  (LEXAPRO ) 5 MG tablet Take 5 mg by mouth daily.     levothyroxine  (SYNTHROID , LEVOTHROID) 25 MCG tablet Take 25 mcg by mouth daily before breakfast.      ondansetron  (ZOFRAN ) 8 MG tablet Take 1 tablet (8 mg total) by mouth every 8 (eight) hours as needed for nausea or vomiting. 30 tablet 1   ondansetron  (ZOFRAN -ODT) 4 MG disintegrating tablet Take 1 tablet (4 mg total) by mouth every 8 (eight) hours as needed for nausea or vomiting. 20 tablet 0   prochlorperazine  (COMPAZINE ) 10 MG tablet Take 1 tablet (10 mg total) by mouth every 6 (six) hours as needed for nausea or vomiting. 30 tablet 1   triamcinolone  (KENALOG ) 0.025 % ointment Apply 1 Application topically 2 (two) times daily. Apply to eczematoid rash on lower extremities 30 g 1   venetoclax  (VENCLEXTA ) 10 & 50 & 100 MG Starter Pack Take by mouth daily. Take 20 mg for 7 days, then 50 mg daily x 7d, then 100 mg daily x 7d, then 200 mg daily x 7d. Take with food & water. 42 tablet 0   No current facility-administered medications for this visit.    PHYSICAL EXAMINATION: ECOG PERFORMANCE STATUS: 1 - Symptomatic but completely ambulatory . Vitals:   01/23/24 0930  BP: (!) 132/59  Pulse: 97  Resp: 20  Temp: (!) 97 F (36.1 C)  SpO2: 98%    Filed Weights   01/23/24 0930  Weight: 173 lb (78.5 kg)   .Body mass index is 27.5 kg/m.  GENERAL:alert, in no acute distress and comfortable SKIN: no acute rashes, no significant lesions EYES:  conjunctiva are pink and non-injected, sclera anicteric OROPHARYNX: MMM, no exudates, no oropharyngeal erythema or ulceration NECK: supple, no JVD LYMPH:  no palpable lymphadenopathy in the cervical, axillary or inguinal regions LUNGS: clear to auscultation b/l with normal respiratory effort HEART: regular rate & rhythm ABDOMEN:  normoactive bowel sounds , non tender, not distended. Extremity: no pedal edema PSYCH: alert & oriented x 3 with fluent speech NEURO: no focal motor/sensory deficits  LABORATORY DATA:   I have reviewed the data as listed  .    Latest Ref Rng & Units 01/23/2024    8:36 AM 01/15/2024    8:55 AM 01/07/2024    8:24 AM  CBC  WBC 4.0 - 10.5 K/uL 1.7  1.3  60.0   Hemoglobin 12.0 - 15.0 g/dL 8.2  8.6  9.8   Hematocrit 36.0 - 46.0 % 24.4  25.9  29.5   Platelets 150 - 400 K/uL 243  172  179    .    Latest Ref Rng & Units 01/23/2024    8:36 AM 01/15/2024    8:55 AM 01/07/2024    8:24 AM  CMP  Glucose 70 - 99 mg/dL 867  872  850   BUN 8 - 23 mg/dL 14  18  21    Creatinine 0.44 - 1.00 mg/dL 9.01  9.06  8.79   Sodium 135 - 145 mmol/L 139  139  140   Potassium 3.5 - 5.1 mmol/L 4.0  4.0  3.9   Chloride 98 - 111 mmol/L 108  107  106   CO2 22 - 32 mmol/L 26  27  27    Calcium 8.9 - 10.3 mg/dL 8.5  8.7  9.2   Total Protein 6.5 - 8.1 g/dL 5.3  5.5  6.0   Total Bilirubin 0.0 - 1.2 mg/dL 0.7  0.6  0.6   Alkaline Phos 38 - 126 U/L 68  81  71   AST 15 - 41 U/L 14  28  15    ALT 0 - 44 U/L 16  55  12    . Lab Results  Component Value Date   LDH 167 01/23/2024     RADIOGRAPHIC STUDIES: I have personally reviewed the radiological images as listed and agreed with the findings in the report. CT Chest W Contrast Result Date: 01/16/2024 CLINICAL DATA:  Chronic lymphocytic leukemia. LEFT anterior rib pain. Concern for malignancy. * Tracking Code: BO * EXAM: CT CHEST WITH CONTRAST TECHNIQUE: Multidetector CT imaging of the chest was performed during intravenous contrast  administration. RADIATION DOSE REDUCTION: This exam was performed according to the departmental dose-optimization program which includes automated exposure control, adjustment of the mA and/or kV according to patient size and/or use of iterative reconstruction technique. CONTRAST:  75mL OMNIPAQUE  IOHEXOL  300 MG/ML  SOLN COMPARISON:  None Available. FINDINGS: Cardiovascular: No significant vascular findings. Normal heart size. No pericardial effusion. Mediastinum/Nodes: No axillary or supraclavicular adenopathy. No mediastinal or hilar adenopathy. No pericardial fluid. Esophagus normal. Lungs/Pleura: Benign appearing pleuroparenchymal thickening of the LEFT and RIGHT lung apex. No suspicious pulmonary nodules. Limited view of the liver, kidneys, pancreas are unremarkable. Normal adrenal glands. Upper Abdomen: Limited view of the liver, kidneys, pancreas are unremarkable. Normal adrenal glands. Musculoskeletal: No aggressive lesion identified in LEFT chest wall. No fracture or rib destruction. No soft tissue abnormality identified. IMPRESSION: 1. No significant LEFT chest wall abnormality identified. 2. No suspicious pulmonary nodules. 3. No mediastinal lymphadenopathy. Electronically Signed   By: Jackquline Boxer M.D.   On: 01/16/2024 17:26    ASSESSMENT & PLAN:  78 year old woman with:   1.  CLL presented with lymphocytosis and adenopathy diagnosed in 2019. (High risk with 17p and 11q deletions) Tremendous response since starting treatment, only experiencing mild fatigue and early satiety.    2.  Rheumatoid arthritis: She remains without any recent exacerbation.   3.  Bullous lesions on her upper and lower extremities: Possibly related to skin reaction associated with Irbutinib or the patient autoimmune disorder (RA)- much improved with topical steroids. No recent lesions noted.    PLAN: - Reviewed labs with patient: WBC low has dropped a little bit to 1.7k with an ANC of 600, Hgb 8.6 > 8.2. PLTs  stable at 243k -Discussed that the cytopenias are likely related to her Gazyva  and  that we shall hold her cycle 1 day 15 dose. - Discussed with patient importance of neutropenic precautions and to seek immediate attention if she developed neutropenic fevers. - Mild anemia likely drug induced and partly related to her CLL. -- Also reviewed Chest CT, which was done for left chest wall pain.  No significant left chest wall abnormality noted.  No suspicious pulmonary nodules or mediastinal lymphadenopathy.. - Hold on infusion today.  Patient will follow-up for her cycle 2-day 1 Gazyva  as scheduled. - Vaccine Counseling provided. - Depending on her cytopenias we will start the venetoclax  with cycle 2 of her cycle 3. - No other acute new toxicities from her Gazyva . - Counseled to drink at least 2 L of water daily.  The total time spent in the appointment was 30 minutes*.  All of the patient's questions were answered with apparent satisfaction. The patient knows to call the clinic with any problems, questions or concerns.   Emaline Saran MD MS AAHIVMS [facility] Hematology/Oncology Physician [facility]  .*Total Encounter Time as defined by the Centers for Medicare and Medicaid Services includes, in addition to the face-to-face time of a patient visit (documented in the note above) non-face-to-face time: obtaining and reviewing outside history, ordering and reviewing medications, tests or procedures, care coordination (communications with other health care professionals or caregivers) and documentation in the medical record.    I,Emily Lagle,acting as a Neurosurgeon for Emaline Saran, MD.,have documented all relevant documentation on the behalf of Emaline Saran, MD,as directed by  Emaline Saran, MD while in the presence of Emaline Saran, MD.  .I have reviewed the above documentation for accuracy and completeness, and I agree with the above. .Joelynn Dust Kishore Burnie Therien MD

## 2024-01-24 ENCOUNTER — Encounter: Payer: Self-pay | Admitting: Hematology

## 2024-01-28 ENCOUNTER — Other Ambulatory Visit: Payer: Self-pay

## 2024-01-29 ENCOUNTER — Encounter: Payer: Self-pay | Admitting: Hematology

## 2024-02-06 ENCOUNTER — Telehealth: Payer: Self-pay | Admitting: Hematology

## 2024-02-06 ENCOUNTER — Ambulatory Visit: Admitting: Physician Assistant

## 2024-02-06 ENCOUNTER — Inpatient Hospital Stay: Attending: Oncology

## 2024-02-06 ENCOUNTER — Inpatient Hospital Stay (HOSPITAL_BASED_OUTPATIENT_CLINIC_OR_DEPARTMENT_OTHER): Admitting: Hematology

## 2024-02-06 VITALS — BP 136/57 | HR 78 | Temp 97.7°F | Resp 20 | Wt 175.7 lb

## 2024-02-06 DIAGNOSIS — Z79899 Other long term (current) drug therapy: Secondary | ICD-10-CM | POA: Insufficient documentation

## 2024-02-06 DIAGNOSIS — Z5112 Encounter for antineoplastic immunotherapy: Secondary | ICD-10-CM | POA: Insufficient documentation

## 2024-02-06 DIAGNOSIS — C9110 Chronic lymphocytic leukemia of B-cell type not having achieved remission: Secondary | ICD-10-CM

## 2024-02-06 LAB — CBC WITH DIFFERENTIAL (CANCER CENTER ONLY)
Abs Immature Granulocytes: 0.02 K/uL (ref 0.00–0.07)
Basophils Absolute: 0 K/uL (ref 0.0–0.1)
Basophils Relative: 2 %
Eosinophils Absolute: 0.1 K/uL (ref 0.0–0.5)
Eosinophils Relative: 4 %
HCT: 26.5 % — ABNORMAL LOW (ref 36.0–46.0)
Hemoglobin: 8.7 g/dL — ABNORMAL LOW (ref 12.0–15.0)
Immature Granulocytes: 1 %
Lymphocytes Relative: 33 %
Lymphs Abs: 0.8 K/uL (ref 0.7–4.0)
MCH: 34.1 pg — ABNORMAL HIGH (ref 26.0–34.0)
MCHC: 32.8 g/dL (ref 30.0–36.0)
MCV: 103.9 fL — ABNORMAL HIGH (ref 80.0–100.0)
Monocytes Absolute: 0.1 K/uL (ref 0.1–1.0)
Monocytes Relative: 3 %
Neutro Abs: 1.4 K/uL — ABNORMAL LOW (ref 1.7–7.7)
Neutrophils Relative %: 57 %
Platelet Count: 233 K/uL (ref 150–400)
RBC: 2.55 MIL/uL — ABNORMAL LOW (ref 3.87–5.11)
RDW: 15.5 % (ref 11.5–15.5)
WBC Count: 2.4 K/uL — ABNORMAL LOW (ref 4.0–10.5)
nRBC: 0 % (ref 0.0–0.2)

## 2024-02-06 LAB — CMP (CANCER CENTER ONLY)
ALT: 11 U/L (ref 0–44)
AST: 13 U/L — ABNORMAL LOW (ref 15–41)
Albumin: 3.5 g/dL (ref 3.5–5.0)
Alkaline Phosphatase: 77 U/L (ref 38–126)
Anion gap: 4 — ABNORMAL LOW (ref 5–15)
BUN: 13 mg/dL (ref 8–23)
CO2: 29 mmol/L (ref 22–32)
Calcium: 9.5 mg/dL (ref 8.9–10.3)
Chloride: 108 mmol/L (ref 98–111)
Creatinine: 0.9 mg/dL (ref 0.44–1.00)
GFR, Estimated: >60 mL/min (ref >60)
Glucose, Bld: 108 mg/dL — ABNORMAL HIGH (ref 70–99)
Potassium: 4.1 mmol/L (ref 3.5–5.1)
Sodium: 141 mmol/L (ref 135–145)
Total Bilirubin: 0.6 mg/dL (ref 0.0–1.2)
Total Protein: 5.6 g/dL — ABNORMAL LOW (ref 6.5–8.1)

## 2024-02-06 MED FILL — Dexamethasone Sodium Phosphate Inj 100 MG/10ML: INTRAMUSCULAR | Qty: 2 | Status: AC

## 2024-02-06 NOTE — Telephone Encounter (Signed)
 Emilya has been scheduled and made aware of her 11/5 appointments.

## 2024-02-06 NOTE — Progress Notes (Signed)
 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 female who is here for continued evaluation and management of CLL. Patient was following up with Dr. Amadeo and has transferried her care to me. She was first diagnosed with CLL in 2019 where she presented with leukocytosis with p53 deletion.    Patient's current treatment is Ibrutinib  420 mg every other day. She was first started on Ibrutinib  daily in July 2020, but was switched to Ibrutinib  every other day in September 2023 due to presumed dermatological toxicity.  Patient notes that she was getting insect bites which was causing her bruising and scaring in September 2023, which is the reason Dr. Amadeo changed her treatment to Ibrutinib  to every other day.    SUMMARY OF ONCOLOGIC HISTORY: Oncology History  CLL (chronic lymphocytic leukemia) (HCC)  07/10/2018 Initial Diagnosis   CLL (chronic lymphocytic leukemia) (HCC)   07/31/2018 - 07/09/2019 Chemotherapy   The patient had riTUXimab  (RITUXAN ) 700 mg in sodium chloride  0.9 % 250 mL (2.1875 mg/mL) infusion, 375 mg/m2 = 700 mg, Intravenous,  Once, 12 of 16 cycles Administration: 700 mg (07/31/2018), 700 mg (08/07/2018), 700 mg (08/14/2018), 700 mg (08/21/2018), 700 mg (02/11/2019), 700 mg (02/18/2019), 700 mg (02/25/2019), 700 mg (03/04/2019), 700 mg (06/18/2019), 700 mg (06/25/2019), 700 mg (07/02/2019), 700 mg (07/09/2019)  for chemotherapy treatment.    09/03/2019 - 11/25/2019 Chemotherapy   Patient is on Treatment Plan : NON-HODGKINS LYMPHOMA Rituximab  D1 / Bendamustine  D1,2 q28d     01/07/2024 -  Chemotherapy   Patient is on Treatment Plan : LYMPHOMA CLL/SLL Venetoclax  + Obinutuzumab   q28d     Prior Therapy:    She is status post rituximab  weekly for 4 weeks completed on August 21, 2018. She is status post thoracentesis completed on August 12, 2019 with positive cytology. Rituximab  375 mg per metered square weekly started on October 13 of 2020 and completed on 03/04/2019.  This will be repeated every 3 months last treatment completed in March 2021. Bendamustine  and rituximab  started on Sep 03, 2019.  She is here for cycle 3 of therapy.  INTERVAL HISTORY: Nicole Campbell is a 78 y.o. female being who is today for continued evaluation and management of CLL. Accompanied by her husband today.   she was last seen by me on 01/23/2024; at the time she mentioned experiencing mild fatigue and early satiety, but was otherwise well.  Today, she says that she has been doing well overall, noting some recent swelling in her right leg following a busy weekend where she was on her feet for extended periods. This has been occurring intermittently for the past year, mainly in her right leg, but occasionally in her left as well w/o any associated pain. In 2021 she did undergo a venous US  to evaluate edema, which was normal. Denies hx of surgeries or clots in right leg that would cause asymmetric swelling. Has been on diuretics in the past (Lasix  20 mg, HCTZ 12.5 & 25 mg, and Aldactazide 25-25 mg).  Reports that she has started taking Iron PO on an empty stomach, with a glass of orange juice. She also endorses eating well.  Tomorrow she will start Cycle 2 / Day 1 of Obinutuzumab  q28d.    States  she that is UTD with her Prevnar-20.   Denies symptoms related to Obinutuzumab : rashes - did suffer some insect bites, but no infection.   REVIEW OF SYSTEMS:    10 Point review of systems of done and is negative except as noted above.  MEDICAL HISTORY Past Medical History:  Diagnosis Date   CLL (chronic lymphocytic leukemia) (HCC)    Hypercalcemia    Hypertension    Hypothyroid    MVP (mitral  valve prolapse)    Rheumatoid arthritis (HCC)    Shingles     SURGICAL HISTORY Past Surgical History:  Procedure Laterality Date   ABDOMINAL HYSTERECTOMY     IR THORACENTESIS ASP PLEURAL SPACE W/IMG GUIDE  11/21/2019   IR THORACENTESIS ASP PLEURAL SPACE W/IMG GUIDE  07/05/2020   TONSILLECTOMY      SOCIAL HISTORY Social History   Tobacco Use   Smoking status: Never   Smokeless tobacco: Never  Vaping Use   Vaping status: Never Used  Substance Use Topics   Alcohol use: Yes    Comment: Rare   Drug use: Never    Social History   Social History Narrative   Not on file    SOCIAL DRIVERS OF HEALTH SDOH Screenings   Food Insecurity: No Food Insecurity (03/16/2023)  Housing: Low Risk  (03/16/2023)  Transportation Needs: No Transportation Needs (03/16/2023)  Utilities: Not At Risk (03/16/2023)  Depression (PHQ2-9): Low Risk  (02/06/2024)  Tobacco Use: Low Risk  (04/27/2023)     FAMILY HISTORY Family History  Problem Relation Age of Onset   Congestive Heart Failure Father      ALLERGIES: is allergic to gazyva  [obinutuzumab ], methotrexate, and erythromycin.  MEDICATIONS  Current Outpatient Medications  Medication Sig Dispense Refill   acetaminophen  (TYLENOL ) 650 MG CR tablet Take 1,300 mg by mouth every 8 (eight) hours as needed for pain.     acyclovir  (ZOVIRAX ) 400 MG tablet Take 1 tablet (400 mg total) by mouth daily. 30 tablet 5   allopurinol  (ZYLOPRIM ) 100 MG tablet Take 1 tablet (100 mg total) by mouth daily. 60 tablet 2   escitalopram  (LEXAPRO ) 5 MG tablet Take 5 mg by mouth daily.     levothyroxine  (SYNTHROID , LEVOTHROID) 25 MCG tablet Take 25 mcg by mouth daily before breakfast.      ondansetron  (ZOFRAN ) 8 MG tablet Take 1 tablet (8 mg total) by mouth every 8 (eight) hours as needed for nausea or vomiting. 30 tablet 1   ondansetron  (ZOFRAN -ODT) 4 MG disintegrating tablet Take 1 tablet (4 mg total) by mouth every 8 (eight) hours as needed for nausea or vomiting.  20 tablet 0   prochlorperazine  (COMPAZINE ) 10 MG tablet Take 1 tablet (10 mg total) by mouth every 6 (six) hours as needed for nausea or vomiting. 30 tablet 1   triamcinolone  (KENALOG ) 0.025 % ointment Apply 1 Application topically 2 (two) times daily. Apply to eczematoid rash on lower extremities 30 g 1   venetoclax  (VENCLEXTA ) 10 & 50 & 100 MG Starter Pack Take by mouth daily. Take 20 mg for 7 days, then 50 mg daily x 7d, then 100 mg daily x 7d, then 200 mg daily x 7d. Take with food & water. 42 tablet 0   No current facility-administered medications for this visit.    VITALS: Vitals:   02/06/24 0938  BP: (!) 136/57  Pulse: 78  Resp: 20  Temp: 97.7 F (36.5 C)  SpO2: 100%   Filed Weights   02/06/24 0938  Weight: 175 lb  11.2 oz (79.7 kg)   Body mass index is 27.93 kg/m.  PHYSICAL EXAMINATION: ECOG PERFORMANCE STATUS: 1 - Symptomatic but completely ambulatory .BP (!) 136/57   Pulse 78   Temp 97.7 F (36.5 C)   Resp 20   Wt 175 lb 11.2 oz (79.7 kg)   SpO2 100%   BMI 27.93 kg/m   GENERAL: alert, in no acute distress and comfortable SKIN: no acute rashes, no significant lesions EYES: conjunctiva are pink and non-injected, sclera anicteric OROPHARYNX: MMM, no exudates, no oropharyngeal erythema or ulceration NECK: supple, no JVD LYMPH:  no palpable lymphadenopathy in the cervical, axillary or inguinal regions LUNGS: clear to auscultation b/l with normal respiratory effort HEART: regular rate & rhythm ABDOMEN:  normoactive bowel sounds , non tender, not distended. Extremity: (+) Right LE edema, no left pedal edema PSYCH: alert & oriented x 3 with fluent speech NEURO: no focal motor/sensory deficits  LABORATORY DATA:   I have reviewed the data as listed     Latest Ref Rng & Units 02/06/2024    9:08 AM 01/23/2024    8:36 AM 01/15/2024    8:55 AM  CBC EXTENDED  WBC 4.0 - 10.5 K/uL 2.4  1.7  1.3   RBC 3.87 - 5.11 MIL/uL 2.55  2.41  2.60   Hemoglobin 12.0 - 15.0 g/dL  8.7  8.2  8.6   HCT 63.9 - 46.0 % 26.5  24.4  25.9   Platelets 150 - 400 K/uL 233  243  172   NEUT# 1.7 - 7.7 K/uL 1.4  0.6  0.7   Lymph# 0.7 - 4.0 K/uL 0.8  1.0  0.5       Latest Ref Rng & Units 02/06/2024    9:08 AM 01/23/2024    8:36 AM 01/15/2024    8:55 AM  CMP  Glucose 70 - 99 mg/dL 891  867  872   BUN 8 - 23 mg/dL 13  14  18    Creatinine 0.44 - 1.00 mg/dL 9.09  9.01  9.06   Sodium 135 - 145 mmol/L 141  139  139   Potassium 3.5 - 5.1 mmol/L 4.1  4.0  4.0   Chloride 98 - 111 mmol/L 108  108  107   CO2 22 - 32 mmol/L 29  26  27    Calcium 8.9 - 10.3 mg/dL 9.5  8.5  8.7   Total Protein 6.5 - 8.1 g/dL 5.6  5.3  5.5   Total Bilirubin 0.0 - 1.2 mg/dL 0.6  0.7  0.6   Alkaline Phos 38 - 126 U/L 77  68  81   AST 15 - 41 U/L 13  14  28    ALT 0 - 44 U/L 11  16  55    RADIOGRAPHIC STUDIES: I have personally reviewed the radiological images as listed and agreed with the findings in the report. CT Chest W Contrast Result Date: 01/16/2024 CLINICAL DATA:  Chronic lymphocytic leukemia. LEFT anterior rib pain. Concern for malignancy. * Tracking Code: BO * EXAM: CT CHEST WITH CONTRAST TECHNIQUE: Multidetector CT imaging of the chest was performed during intravenous contrast administration. RADIATION DOSE REDUCTION: This exam was performed according to the departmental dose-optimization program which includes automated exposure control, adjustment of the mA and/or kV according to patient size and/or use of iterative reconstruction technique. CONTRAST:  75mL OMNIPAQUE  IOHEXOL  300 MG/ML  SOLN COMPARISON:  None Available. FINDINGS: Cardiovascular: No significant vascular findings. Normal heart size. No pericardial effusion. Mediastinum/Nodes: No axillary or  supraclavicular adenopathy. No mediastinal or hilar adenopathy. No pericardial fluid. Esophagus normal. Lungs/Pleura: Benign appearing pleuroparenchymal thickening of the LEFT and RIGHT lung apex. No suspicious pulmonary nodules. Limited view of the  liver, kidneys, pancreas are unremarkable. Normal adrenal glands. Upper Abdomen: Limited view of the liver, kidneys, pancreas are unremarkable. Normal adrenal glands. Musculoskeletal: No aggressive lesion identified in LEFT chest wall. No fracture or rib destruction. No soft tissue abnormality identified. IMPRESSION: 1. No significant LEFT chest wall abnormality identified. 2. No suspicious pulmonary nodules. 3. No mediastinal lymphadenopathy. Electronically Signed   By: Jackquline Boxer M.D.   On: 01/16/2024 17:26    ASSESSMENT & PLAN:  78 y.o. female with  1.  CLL presented with lymphocytosis and adenopathy diagnosed in 2019. (High risk with 17p and 11q deletions) Tremendous response since starting treatment, only experiencing mild fatigue and early satiety.    2.  Rheumatoid arthritis: She remains without any recent exacerbation.   3.  Bullous lesions on her upper and lower extremities: Possibly related to skin reaction associated with Irbutinib or the patient autoimmune disorder (RA)- much improved with topical steroids. No recent lesions noted.     PLAN: -Discussed lab results on 02/06/2024 in detail with patient.  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   Explained exacerbating factors (high sodium intake or extensive periods of time standing/walking/sitting with legs down) and risk of possible venous ulcer formation  Recommend compression socks, low salt, and keeping legs elevated as management  Offered US  to evaluate for clots or venous competence  Can avoid diuretic currently, but this may be required if edema persists   - Vaccine counseling provided: Flu and Covid-19  FOLLOW-UP  - Continue infusion tomorrow (10/09) - Plan to start  Venetoclax  with Cycle 3 - Plz schedule next 2 cycles of Gazyva  per integrated scheduling. - US  venous lower extremities in 1 week  .The total time spent in the appointment was 30 minutes* .  All of the patient's questions were answered with apparent satisfaction. The patient knows to call the clinic with any problems, questions or concerns.   Nicole Saran MD MS AAHIVMS [facility] Hematology/Oncology Physician [facility]  .*Total Encounter Time as defined by the Centers for Medicare and Medicaid Services includes, in addition to the face-to-face time of a patient visit (documented in the note above) non-face-to-face time: obtaining and reviewing outside history, ordering and reviewing medications, tests or procedures, care coordination (communications with other health care professionals or caregivers) and documentation in the medical record.  I,Emily Lagle,acting as a Neurosurgeon for Nicole Saran, MD.,have documented all relevant documentation on the behalf of Nicole Saran, MD,as directed by  Nicole Saran, MD while in the presence of Nicole Saran, MD.  I have reviewed the above documentation for accuracy and completeness, and I agree with the above.  Kato Wieczorek, MD

## 2024-02-07 ENCOUNTER — Inpatient Hospital Stay

## 2024-02-07 ENCOUNTER — Other Ambulatory Visit: Payer: Self-pay

## 2024-02-07 VITALS — BP 130/68 | HR 81 | Temp 97.9°F | Resp 18

## 2024-02-07 DIAGNOSIS — Z5112 Encounter for antineoplastic immunotherapy: Secondary | ICD-10-CM | POA: Diagnosis not present

## 2024-02-07 DIAGNOSIS — C9110 Chronic lymphocytic leukemia of B-cell type not having achieved remission: Secondary | ICD-10-CM

## 2024-02-07 MED ORDER — ACETAMINOPHEN 325 MG PO TABS
650.0000 mg | ORAL_TABLET | Freq: Once | ORAL | Status: AC
  Administered 2024-02-07: 650 mg via ORAL
  Filled 2024-02-07: qty 2

## 2024-02-07 MED ORDER — MONTELUKAST SODIUM 10 MG PO TABS
10.0000 mg | ORAL_TABLET | Freq: Once | ORAL | Status: AC
  Administered 2024-02-07: 10 mg via ORAL
  Filled 2024-02-07: qty 1

## 2024-02-07 MED ORDER — SODIUM CHLORIDE 0.9 % IV SOLN
1000.0000 mg | Freq: Once | INTRAVENOUS | Status: AC
  Administered 2024-02-07: 1000 mg via INTRAVENOUS
  Filled 2024-02-07: qty 40

## 2024-02-07 MED ORDER — DIPHENHYDRAMINE HCL 50 MG/ML IJ SOLN
50.0000 mg | Freq: Once | INTRAMUSCULAR | Status: AC
  Administered 2024-02-07: 50 mg via INTRAVENOUS
  Filled 2024-02-07: qty 1

## 2024-02-07 MED ORDER — SODIUM CHLORIDE 0.9 % IV SOLN
20.0000 mg | Freq: Once | INTRAVENOUS | Status: AC
  Administered 2024-02-07: 20 mg via INTRAVENOUS
  Filled 2024-02-07: qty 2

## 2024-02-07 MED ORDER — FAMOTIDINE IN NACL 20-0.9 MG/50ML-% IV SOLN
20.0000 mg | Freq: Once | INTRAVENOUS | Status: AC
  Administered 2024-02-07: 20 mg via INTRAVENOUS
  Filled 2024-02-07: qty 50

## 2024-02-07 MED ORDER — MORPHINE SULFATE (PF) 2 MG/ML IV SOLN
2.0000 mg | Freq: Once | INTRAVENOUS | Status: AC
  Administered 2024-02-07: 2 mg via INTRAVENOUS
  Filled 2024-02-07: qty 1

## 2024-02-07 MED ORDER — SODIUM CHLORIDE 0.9 % IV SOLN: INTRAVENOUS | Status: DC

## 2024-02-07 NOTE — Patient Instructions (Signed)
 [facility] - A DEPT OF [facility]  Discharge Instructions: Thank you for choosing [facility] to provide your oncology and hematology care.   If you have a lab appointment with the Cancer Center, please go directly to the Cancer Center and check in at the registration area.   Wear comfortable clothing and clothing appropriate for easy access to any Portacath or PICC line.   We strive to give you quality time with your provider. You may need to reschedule your appointment if you arrive late (15 or more minutes).  Arriving late affects you and other patients whose appointments are after yours.  Also, if you miss three or more appointments without notifying the office, you may be dismissed from the clinic at the provider's discretion.      For prescription refill requests, have your pharmacy contact our office and allow 72 hours for refills to be completed.    Today you received the following chemotherapy and/or immunotherapy agents Gazyva   To help prevent nausea and vomiting after your treatment, we encourage you to take your nausea medication as directed.  BELOW ARE SYMPTOMS THAT SHOULD BE REPORTED IMMEDIATELY: *FEVER GREATER THAN 100.4 F (38 C) OR HIGHER *CHILLS OR SWEATING *NAUSEA AND VOMITING THAT IS NOT CONTROLLED WITH YOUR NAUSEA MEDICATION *UNUSUAL SHORTNESS OF BREATH *UNUSUAL BRUISING OR BLEEDING *URINARY PROBLEMS (pain or burning when urinating, or frequent urination) *BOWEL PROBLEMS (unusual diarrhea, constipation, pain near the anus) TENDERNESS IN MOUTH AND THROAT WITH OR WITHOUT PRESENCE OF ULCERS (sore throat, sores in mouth, or a toothache) UNUSUAL RASH, SWELLING OR PAIN  UNUSUAL VAGINAL DISCHARGE OR ITCHING   Items with * indicate a potential emergency and should be followed up as soon as possible or go to the Emergency Department if any problems should occur.  Please show the CHEMOTHERAPY ALERT CARD or IMMUNOTHERAPY ALERT  CARD at check-in to the Emergency Department and triage nurse.  Should you have questions after your visit or need to cancel or reschedule your appointment, please contact [facility] - A DEPT [facility]  Dept: [phone]  and follow the prompts.  Office hours are 8:00 a.m. to 4:30 p.m. Monday - Friday. Please note that voicemails left after 4:00 p.m. may not be returned until the following business day.  We are closed weekends and major holidays. You have access to a nurse at all times for urgent questions. Please call the main number to the clinic Dept: [phone] and follow the prompts.   For any non-urgent questions, you may also contact your provider using MyChart. We now offer e-Visits for anyone 76 and older to request care online for non-urgent symptoms. For details visit [email or web address].   Also download the MyChart app! Go to the app store, search "MyChart", open the app, select [place], and log in with your MyChart username and password.

## 2024-02-08 ENCOUNTER — Telehealth: Payer: Self-pay

## 2024-02-08 NOTE — Telephone Encounter (Signed)
 Oral Oncology Patient Advocate Encounter  Dr. Onesimo wants to postpone Venclexta  until Cycle 3 due to labs, waiting until appt on 03/05/24.  Lucie Lamer, CPhT [place]  [facility] Oncology Pharmacy Patient Advocate Specialist [address] [facility] [place] Phone: [phone]  Fax: [phone] [email or web address]

## 2024-02-13 ENCOUNTER — Encounter: Payer: Self-pay | Admitting: Hematology

## 2024-02-14 ENCOUNTER — Other Ambulatory Visit: Payer: Self-pay

## 2024-02-15 ENCOUNTER — Other Ambulatory Visit: Payer: Self-pay

## 2024-02-26 ENCOUNTER — Other Ambulatory Visit (HOSPITAL_COMMUNITY): Payer: Self-pay

## 2024-02-26 ENCOUNTER — Encounter: Payer: Self-pay | Admitting: Hematology

## 2024-02-28 ENCOUNTER — Other Ambulatory Visit: Payer: Self-pay

## 2024-03-03 ENCOUNTER — Other Ambulatory Visit: Payer: Self-pay

## 2024-03-03 NOTE — Progress Notes (Signed)
 Per Dr. Maryla note on 01/23/24, patient was to hold C1D15 on 01/23/24 and resume treatment with C2D1 as scheduled on 10/9. However, when the patient came for treatment on 10/9, the orders were released under C1D15 instead of C2D1. Treatment plan dates have been adjusted accordingly but should be noted that patient is technically scheduled to receive C3D1 on 11/5 per Dr. Maryla plan.  Harlene Nasuti, PharmD Oncology Infusion Pharmacist 03/03/2024 4:14 PM

## 2024-03-04 MED FILL — Dexamethasone Sodium Phosphate Inj 100 MG/10ML: INTRAMUSCULAR | Qty: 2 | Status: AC

## 2024-03-04 NOTE — Progress Notes (Unsigned)
 HEMATOLOGY/ONCOLOGY CLINIC NOTE  Date of Service: 03/05/2024  Patient Care Team: Claudene Pellet, MD as PCP - General (Family Medicine) Jesus Oliphant, MD as Consulting Physician (Otolaryngology)  CHIEF COMPLAINTS/PURPOSE OF CONSULTATION:  Follow-up for continued valuation and management of CLL and follow-up for biopsy of right flank mass  Prior therapy:    She is status post rituximab  weekly for 4 weeks completed on August 21, 2018. She is status post thoracentesis completed on August 12, 2019 with positive cytology. Rituximab  375 mg per metered square weekly started on October 13 of 2020 and completed on 03/04/2019.  This will be repeated every 3 months last treatment completed in March 2021. Bendamustine  and rituximab  started on Sep 03, 2019.  She is here for cycle 3 of therapy.   INTERVAL HISTORY:  Nicole Campbell is a 78 y.o. female who is here for continued evaluation and management of her CLL.  Nicole Campbell reports her energy levels are fairly stable.  She is able to complete all her daily activities on her own.  Her appetite and weight are unchanged.  She denies nausea, vomiting or any bowel habit changes.  She does bruise easily but denies overt signs of bleeding including hematochezia or melena.  She has noticed mild swelling in her left lower leg more than her right lower leg.  She denies any pain in her leg or difficulty with ambulation.  She denies fevers, chills, night sweats, shortness of breath, chest pain or cough.  She has no other complaints.  Rest of the 10 point ROS as below.  MEDICAL HISTORY:  Past Medical History:  Diagnosis Date   CLL (chronic lymphocytic leukemia) (HCC)    Hypercalcemia    Hypertension    Hypothyroid    MVP (mitral valve prolapse)    Rheumatoid arthritis (HCC)    Shingles     SURGICAL HISTORY: Past Surgical History:  Procedure Laterality Date   ABDOMINAL HYSTERECTOMY     IR THORACENTESIS ASP PLEURAL SPACE W/IMG GUIDE  11/21/2019    IR THORACENTESIS ASP PLEURAL SPACE W/IMG GUIDE  07/05/2020   TONSILLECTOMY      SOCIAL HISTORY: Social History   Socioeconomic History   Marital status: Married    Spouse name: Not on file   Number of children: 3   Years of education: Not on file   Highest education level: Not on file  Occupational History   Not on file  Tobacco Use   Smoking status: Never   Smokeless tobacco: Never  Vaping Use   Vaping status: Never Used  Substance and Sexual Activity   Alcohol use: Yes    Comment: Rare   Drug use: Never   Sexual activity: Not on file  Other Topics Concern   Not on file  Social History Narrative   Not on file   Social Drivers of Health   Financial Resource Strain: Not on file  Food Insecurity: No Food Insecurity (03/16/2023)   Hunger Vital Sign    Worried About Running Out of Food in the Last Year: Never true    Ran Out of Food in the Last Year: Never true  Transportation Needs: No Transportation Needs (03/16/2023)   PRAPARE - Administrator, Civil Service (Medical): No    Lack of Transportation (Non-Medical): No  Physical Activity: Not on file  Stress: Not on file  Social Connections: Not on file  Intimate Partner Violence: Not At Risk (03/16/2023)   Humiliation, Afraid, Rape, and Kick questionnaire  Fear of Current or Ex-Partner: No    Emotionally Abused: No    Physically Abused: No    Sexually Abused: No    FAMILY HISTORY: Family History  Problem Relation Age of Onset   Congestive Heart Failure Father     ALLERGIES:  is allergic to gazyva  [obinutuzumab ], methotrexate, and erythromycin.  MEDICATIONS:  Current Outpatient Medications  Medication Sig Dispense Refill   acetaminophen  (TYLENOL ) 650 MG CR tablet Take 1,300 mg by mouth every 8 (eight) hours as needed for pain.     acyclovir  (ZOVIRAX ) 400 MG tablet Take 1 tablet (400 mg total) by mouth daily. 30 tablet 5   allopurinol  (ZYLOPRIM ) 100 MG tablet Take 1 tablet (100 mg total) by mouth  daily. 60 tablet 2   escitalopram  (LEXAPRO ) 5 MG tablet Take 5 mg by mouth daily.     levothyroxine  (SYNTHROID , LEVOTHROID) 25 MCG tablet Take 25 mcg by mouth daily before breakfast.      ondansetron  (ZOFRAN ) 8 MG tablet Take 1 tablet (8 mg total) by mouth every 8 (eight) hours as needed for nausea or vomiting. 30 tablet 1   ondansetron  (ZOFRAN -ODT) 4 MG disintegrating tablet Take 1 tablet (4 mg total) by mouth every 8 (eight) hours as needed for nausea or vomiting. 20 tablet 0   prochlorperazine  (COMPAZINE ) 10 MG tablet Take 1 tablet (10 mg total) by mouth every 6 (six) hours as needed for nausea or vomiting. 30 tablet 1   triamcinolone  (KENALOG ) 0.025 % ointment Apply 1 Application topically 2 (two) times daily. Apply to eczematoid rash on lower extremities 30 g 1   venetoclax  (VENCLEXTA ) 10 & 50 & 100 MG Starter Pack Take by mouth daily. Take 20 mg for 7 days, then 50 mg daily x 7d, then 100 mg daily x 7d, then 200 mg daily x 7d. Take with food & water. 42 tablet 0   No current facility-administered medications for this visit.    REVIEW OF SYSTEMS:   10 Point review of Systems was done is negative except as noted above.  PHYSICAL EXAMINATION: ECOG PERFORMANCE STATUS: 1 - Symptomatic but completely ambulatory  GENERAL:alert, in no acute distress and comfortable SKIN: no acute rashes, no significant lesions EYES: conjunctiva are pink and non-injected, sclera anicteric LUNGS: clear to auscultation b/l with normal respiratory effort HEART: regular rate & rhythm Extremity: Trace left lower leg edema, non tender.  PSYCH: alert & oriented x 3 with fluent speech NEURO: no focal motor/sensory deficits   LABORATORY DATA:  I have reviewed the data as listed  .    Latest Ref Rng & Units 02/06/2024    9:08 AM 01/23/2024    8:36 AM 01/15/2024    8:55 AM  CBC  WBC 4.0 - 10.5 K/uL 2.4  1.7  1.3   Hemoglobin 12.0 - 15.0 g/dL 8.7  8.2  8.6   Hematocrit 36.0 - 46.0 % 26.5  24.4  25.9    Platelets 150 - 400 K/uL 233  243  172    .    Latest Ref Rng & Units 02/06/2024    9:08 AM 01/23/2024    8:36 AM 01/15/2024    8:55 AM  CMP  Glucose 70 - 99 mg/dL 891  867  872   BUN 8 - 23 mg/dL 13  14  18    Creatinine 0.44 - 1.00 mg/dL 9.09  9.01  9.06   Sodium 135 - 145 mmol/L 141  139  139   Potassium 3.5 - 5.1 mmol/L  4.1  4.0  4.0   Chloride 98 - 111 mmol/L 108  108  107   CO2 22 - 32 mmol/L 29  26  27    Calcium 8.9 - 10.3 mg/dL 9.5  8.5  8.7   Total Protein 6.5 - 8.1 g/dL 5.6  5.3  5.5   Total Bilirubin 0.0 - 1.2 mg/dL 0.6  0.7  0.6   Alkaline Phos 38 - 126 U/L 77  68  81   AST 15 - 41 U/L 13  14  28    ALT 0 - 44 U/L 11  16  55    . Lab Results  Component Value Date   LDH 167 01/23/2024   SURGICAL PATHOLOGY CASE: [id number] PATIENT: Nicole Campbell Surgical Pathology Report   Clinical History: left flank mass, Q CLL (tb)   FINAL MICROSCOPIC DIAGNOSIS:  A. 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 Immunohistochemical stains reveal the neoplastic cells are positive for CD20, CD5 and CD23. The larger cells show positivity for cyclin D1. The ki-67 proliferation index is overall low with increase in focal areas, likely consistent with proliferation centers. No sheets of large cells are noted and transformation to a diffuse large B cell lymphoma is considered much less likely. However, this evaluation is based on the needle core biopsy and the possibility of transformation in other areas cannot be completely excluded. Clinical and imaging correlation is recommended.  Legend: (L) Low (H) High RADIOGRAPHIC STUDIES: I have personally reviewed the radiological images as listed and agreed with the findings in the  report. No results found.    ASSESSMENT & PLAN:  Nicole Campbell is a 78 y.o. female who presents for continued management of CLL.   1.  CLL presented with lymphocytosis and adenopathy diagnosed in 2019. 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  Rheumatoid arthritis: She remains without any recent exacerbation.  3.  Bullous lesions on her upper and lower extremities: Possibly related to skin reaction associated with Irbutinib or the patient autoimmune disorder (RA)- much improved with topical steroids.  PLAN: --Due for cycle 2, Day 1 of Gazyva  today --Labs today were reviewed and adequate for treatment. WBC 3.4, Hgb 10.8, MCV 100.3, Plt 245, ANC 1.7, creatinine 1.04, LFTs normal.  --No prohibitive toxicities so continue with Gazyva  as planned.  --Continue Allopurinol  100 mg p.o. twice daily for TLS prophylaxis --Encouraged patient to drink 1-2 L of water per day --Elevated legs and wear compression stockings. Minimize high salt foods. Advised to follow up with clinic before end of the week if swelling worsens --Tentative plan to add venetoclax  with Cycle 3.    FOLLOW-UP: RTC in 4 weeks for labs and follow up prior to Cycle 3, Day 1 of Gazyva .   All of the patient's questions were answered with apparent satisfaction. The patient knows to call the clinic with any problems, questions or concerns.   I have spent a total of 30 minutes minutes of face-to-face and non-face-to-face time, preparing to see the patient,performing a medically appropriate examination, counseling and educating the patient, documenting clinical information in the electronic health record, independently interpreting results and communicating results to the patient, and care coordination.   Johnston Police PA-C Dept of Hematology and Oncology [facility]  Phone: [phone]

## 2024-03-05 ENCOUNTER — Other Ambulatory Visit: Payer: Self-pay

## 2024-03-05 ENCOUNTER — Inpatient Hospital Stay: Admitting: Physician Assistant

## 2024-03-05 ENCOUNTER — Inpatient Hospital Stay

## 2024-03-05 ENCOUNTER — Inpatient Hospital Stay: Attending: Oncology

## 2024-03-05 VITALS — BP 144/56 | HR 70 | Temp 97.2°F | Resp 16 | Ht 66.5 in | Wt 175.0 lb

## 2024-03-05 VITALS — BP 157/76 | HR 85 | Temp 97.6°F | Resp 18

## 2024-03-05 DIAGNOSIS — C9110 Chronic lymphocytic leukemia of B-cell type not having achieved remission: Secondary | ICD-10-CM

## 2024-03-05 DIAGNOSIS — Z5112 Encounter for antineoplastic immunotherapy: Secondary | ICD-10-CM | POA: Insufficient documentation

## 2024-03-05 DIAGNOSIS — Z79899 Other long term (current) drug therapy: Secondary | ICD-10-CM | POA: Insufficient documentation

## 2024-03-05 LAB — CMP (CANCER CENTER ONLY)
ALT: 24 U/L (ref 0–44)
AST: 24 U/L (ref 15–41)
Albumin: 3.7 g/dL (ref 3.5–5.0)
Alkaline Phosphatase: 82 U/L (ref 38–126)
Anion gap: 4 — ABNORMAL LOW (ref 5–15)
BUN: 16 mg/dL (ref 8–23)
CO2: 30 mmol/L (ref 22–32)
Calcium: 9.3 mg/dL (ref 8.9–10.3)
Chloride: 106 mmol/L (ref 98–111)
Creatinine: 1.04 mg/dL — ABNORMAL HIGH (ref 0.44–1.00)
GFR, Estimated: 55 mL/min — ABNORMAL LOW (ref >60)
Glucose, Bld: 117 mg/dL — ABNORMAL HIGH (ref 70–99)
Potassium: 4.1 mmol/L (ref 3.5–5.1)
Sodium: 140 mmol/L (ref 135–145)
Total Bilirubin: 0.4 mg/dL (ref 0.0–1.2)
Total Protein: 5.7 g/dL — ABNORMAL LOW (ref 6.5–8.1)

## 2024-03-05 LAB — CBC WITH DIFFERENTIAL (CANCER CENTER ONLY)
Abs Immature Granulocytes: 0.02 K/uL (ref 0.00–0.07)
Basophils Absolute: 0 K/uL (ref 0.0–0.1)
Basophils Relative: 1 %
Eosinophils Absolute: 0.3 K/uL (ref 0.0–0.5)
Eosinophils Relative: 10 %
HCT: 32.5 % — ABNORMAL LOW (ref 36.0–46.0)
Hemoglobin: 10.8 g/dL — ABNORMAL LOW (ref 12.0–15.0)
Immature Granulocytes: 1 %
Lymphocytes Relative: 34 %
Lymphs Abs: 1.2 K/uL (ref 0.7–4.0)
MCH: 33.3 pg (ref 26.0–34.0)
MCHC: 33.2 g/dL (ref 30.0–36.0)
MCV: 100.3 fL — ABNORMAL HIGH (ref 80.0–100.0)
Monocytes Absolute: 0.1 K/uL (ref 0.1–1.0)
Monocytes Relative: 4 %
Neutro Abs: 1.7 K/uL (ref 1.7–7.7)
Neutrophils Relative %: 50 %
Platelet Count: 245 K/uL (ref 150–400)
RBC: 3.24 MIL/uL — ABNORMAL LOW (ref 3.87–5.11)
RDW: 13.4 % (ref 11.5–15.5)
Smear Review: NORMAL
WBC Count: 3.4 K/uL — ABNORMAL LOW (ref 4.0–10.5)
nRBC: 0 % (ref 0.0–0.2)

## 2024-03-05 MED ORDER — ACETAMINOPHEN 325 MG PO TABS
650.0000 mg | ORAL_TABLET | Freq: Once | ORAL | Status: AC
  Administered 2024-03-05: 650 mg via ORAL
  Filled 2024-03-05: qty 2

## 2024-03-05 MED ORDER — MONTELUKAST SODIUM 10 MG PO TABS
10.0000 mg | ORAL_TABLET | Freq: Once | ORAL | Status: AC
  Administered 2024-03-05: 10 mg via ORAL
  Filled 2024-03-05: qty 1

## 2024-03-05 MED ORDER — SODIUM CHLORIDE 0.9 % IV SOLN
1000.0000 mg | Freq: Once | INTRAVENOUS | Status: AC
  Administered 2024-03-05: 1000 mg via INTRAVENOUS
  Filled 2024-03-05: qty 40

## 2024-03-05 MED ORDER — SODIUM CHLORIDE 0.9 % IV SOLN
20.0000 mg | Freq: Once | INTRAVENOUS | Status: AC
  Administered 2024-03-05: 20 mg via INTRAVENOUS
  Filled 2024-03-05: qty 20
  Filled 2024-03-05: qty 2

## 2024-03-05 MED ORDER — FAMOTIDINE IN NACL 20-0.9 MG/50ML-% IV SOLN
20.0000 mg | Freq: Once | INTRAVENOUS | Status: AC
  Administered 2024-03-05: 20 mg via INTRAVENOUS
  Filled 2024-03-05: qty 50

## 2024-03-05 MED ORDER — MORPHINE SULFATE (PF) 2 MG/ML IV SOLN
2.0000 mg | Freq: Once | INTRAVENOUS | Status: AC
  Administered 2024-03-05: 2 mg via INTRAVENOUS
  Filled 2024-03-05: qty 1

## 2024-03-05 MED ORDER — SODIUM CHLORIDE 0.9 % IV SOLN: INTRAVENOUS | Status: DC

## 2024-03-05 MED ORDER — DIPHENHYDRAMINE HCL 50 MG/ML IJ SOLN
50.0000 mg | Freq: Once | INTRAMUSCULAR | Status: AC
  Administered 2024-03-05: 50 mg via INTRAVENOUS
  Filled 2024-03-05: qty 1

## 2024-03-05 NOTE — Patient Instructions (Signed)
 [facility] - A DEPT OF [facility]  Discharge Instructions: Thank you for choosing [facility] to provide your oncology and hematology care.   If you have a lab appointment with the Cancer Center, please go directly to the Cancer Center and check in at the registration area.   Wear comfortable clothing and clothing appropriate for easy access to any Portacath or PICC line.   We strive to give you quality time with your provider. You may need to reschedule your appointment if you arrive late (15 or more minutes).  Arriving late affects you and other patients whose appointments are after yours.  Also, if you miss three or more appointments without notifying the office, you may be dismissed from the clinic at the provider's discretion.      For prescription refill requests, have your pharmacy contact our office and allow 72 hours for refills to be completed.    Today you received the following chemotherapy and/or immunotherapy agents Gazyva   To help prevent nausea and vomiting after your treatment, we encourage you to take your nausea medication as directed.  BELOW ARE SYMPTOMS THAT SHOULD BE REPORTED IMMEDIATELY: *FEVER GREATER THAN 100.4 F (38 C) OR HIGHER *CHILLS OR SWEATING *NAUSEA AND VOMITING THAT IS NOT CONTROLLED WITH YOUR NAUSEA MEDICATION *UNUSUAL SHORTNESS OF BREATH *UNUSUAL BRUISING OR BLEEDING *URINARY PROBLEMS (pain or burning when urinating, or frequent urination) *BOWEL PROBLEMS (unusual diarrhea, constipation, pain near the anus) TENDERNESS IN MOUTH AND THROAT WITH OR WITHOUT PRESENCE OF ULCERS (sore throat, sores in mouth, or a toothache) UNUSUAL RASH, SWELLING OR PAIN  UNUSUAL VAGINAL DISCHARGE OR ITCHING   Items with * indicate a potential emergency and should be followed up as soon as possible or go to the Emergency Department if any problems should occur.  Please show the CHEMOTHERAPY ALERT CARD or IMMUNOTHERAPY ALERT  CARD at check-in to the Emergency Department and triage nurse.  Should you have questions after your visit or need to cancel or reschedule your appointment, please contact [facility] - A DEPT [facility]  Dept: [phone]  and follow the prompts.  Office hours are 8:00 a.m. to 4:30 p.m. Monday - Friday. Please note that voicemails left after 4:00 p.m. may not be returned until the following business day.  We are closed weekends and major holidays. You have access to a nurse at all times for urgent questions. Please call the main number to the clinic Dept: [phone] and follow the prompts.   For any non-urgent questions, you may also contact your provider using MyChart. We now offer e-Visits for anyone 76 and older to request care online for non-urgent symptoms. For details visit [email or web address].   Also download the MyChart app! Go to the app store, search "MyChart", open the app, select [place], and log in with your MyChart username and password.

## 2024-03-06 ENCOUNTER — Other Ambulatory Visit: Payer: Self-pay

## 2024-03-28 ENCOUNTER — Other Ambulatory Visit (HOSPITAL_COMMUNITY): Payer: Self-pay

## 2024-03-31 NOTE — Progress Notes (Signed)
 HEMATOLOGY ONCOLOGY PROGRESS NOTE  Date of service: 04/02/2024  Patient Care Team: Claudene Pellet, MD as PCP - General (Family Medicine) Jesus Oliphant, MD as Consulting Physician (Otolaryngology)  CHIEF COMPLAINT/PURPOSE OF CONSULTATION: Follow-up for continued evaluation and management of CLL   HISTORY OF PRESENTING ILLNESS: Nicole Campbell is a wonderful 78 y.o. female who is here for continued evaluation and management of CLL. Patient was following up with Dr. Amadeo and has transferried her care to me. She was first diagnosed with CLL in 2019 where she presented with leukocytosis with p53 deletion.    Patient's current treatment is Ibrutinib  420 mg every other day. She was first started on Ibrutinib  daily in July 2020, but was switched to Ibrutinib  every other day in September 2023 due to presumed dermatological toxicity.  Patient notes that she was getting insect bites which was causing her bruising and scaring in September 2023, which is the reason Dr. Amadeo changed her treatment to Ibrutinib  to every other day.    SUMMARY OF ONCOLOGIC HISTORY: Prior Therapy:    She is status post rituximab  weekly for 4 weeks completed on August 21, 2018. She is status post thoracentesis completed on August 12, 2019 with positive cytology. Rituximab  375 mg per metered square weekly started on October 13 of 2020 and completed on 03/04/2019.  This will be repeated every 3 months last treatment completed in March 2021. Bendamustine  and rituximab  started on Sep 03, 2019.  She is here for cycle 3 of therapy. Oncology History  CLL (chronic lymphocytic leukemia) (HCC)  07/10/2018 Initial Diagnosis   CLL (chronic lymphocytic leukemia) (HCC)   07/31/2018 - 07/09/2019 Chemotherapy   The patient had riTUXimab  (RITUXAN ) 700 mg in sodium chloride  0.9 % 250 mL (2.1875 mg/mL) infusion, 375 mg/m2 = 700 mg, Intravenous,  Once, 12 of 16 cycles Administration: 700 mg (07/31/2018), 700 mg (08/07/2018), 700 mg (08/14/2018),  700 mg (08/21/2018), 700 mg (02/11/2019), 700 mg (02/18/2019), 700 mg (02/25/2019), 700 mg (03/04/2019), 700 mg (06/18/2019), 700 mg (06/25/2019), 700 mg (07/02/2019), 700 mg (07/09/2019)  for chemotherapy treatment.    09/03/2019 - 11/25/2019 Chemotherapy   Patient is on Treatment Plan : NON-HODGKINS LYMPHOMA Rituximab  D1 / Bendamustine  D1,2 q28d     01/07/2024 -  Chemotherapy   Patient is on Treatment Plan : LYMPHOMA CLL/SLL Venetoclax  + Obinutuzumab  q28d      CURRENT THERAPY: Cycle 3 of Obinutuzumab  + (starting cycle 4) Venetoclax    INTERVAL HISTORY: Nicole Campbell is a 78 y.o. female who is here today for continued evaluation and management of CLL.   she was last seen by me on 02/06/2024; at the time she did not have any concerns and was doing well.    Seen by Neomi Johnston DASEN, PA-C on 03/05/2024 and noted some increased swelling in her left leg vs right leg, but was otherwise doing well.   Today, she notes that the mass on her flank has been painful, but additionally notes that she has been quite active recently.  Denies any skin reactions,  []  new infection issues,  [x]  fevers/chills,  [x]  drenching night sweats,  []  unexpected weight change,  []  back pain,  []  chest pain,  []  abdominal pain,  []  new bone pains,  [x]  new lumps/bumps,  []  leg swelling,  []  bleeding issues (nose bleeds, gum bleeds, abnormal/spontaneous bruising),  []  nausea/vomiting,  []  diarrhea,  []  bowel/urinary changes, []  SOB,  []  change in breathing  She reports that she has updated her flu and covid-19  vaccinations - took them on same day, same arm and did develop a mild fever and some achiness.   States that she has started taking a liquid iron supplement and continues to take Vitamin-D and Vitamin B-complex.   REVIEW OF SYSTEMS:   10 Point review of systems of done and is negative except as noted above.  MEDICAL HISTORY Past Medical History:  Diagnosis Date   CLL (chronic lymphocytic leukemia)  (HCC)    Hypercalcemia    Hypertension    Hypothyroid    MVP (mitral valve prolapse)    Rheumatoid arthritis (HCC)    Shingles     SURGICAL HISTORY Past Surgical History:  Procedure Laterality Date   ABDOMINAL HYSTERECTOMY     IR THORACENTESIS ASP PLEURAL SPACE W/IMG GUIDE  11/21/2019   IR THORACENTESIS ASP PLEURAL SPACE W/IMG GUIDE  07/05/2020   TONSILLECTOMY      SOCIAL HISTORY Social History   Tobacco Use   Smoking status: Never   Smokeless tobacco: Never  Vaping Use   Vaping status: Never Used  Substance Use Topics   Alcohol use: Yes    Comment: Rare   Drug use: Never    Social History   Social History Narrative   Not on file    SOCIAL DRIVERS OF HEALTH SDOH Screenings   Food Insecurity: No Food Insecurity (03/16/2023)  Housing: Low Risk  (03/16/2023)  Transportation Needs: No Transportation Needs (03/16/2023)  Utilities: Not At Risk (03/16/2023)  Depression (PHQ2-9): Low Risk  (02/06/2024)  Tobacco Use: Low Risk  (04/27/2023)     FAMILY HISTORY Family History  Problem Relation Age of Onset   Congestive Heart Failure Father      ALLERGIES: is allergic to gazyva  [obinutuzumab ], methotrexate, and erythromycin.  MEDICATIONS  Current Outpatient Medications  Medication Sig Dispense Refill   acetaminophen  (TYLENOL ) 650 MG CR tablet Take 1,300 mg by mouth every 8 (eight) hours as needed for pain.     acyclovir  (ZOVIRAX ) 400 MG tablet Take 1 tablet (400 mg total) by mouth daily. 30 tablet 5   allopurinol  (ZYLOPRIM ) 100 MG tablet Take 1 tablet (100 mg total) by mouth daily. 60 tablet 2   escitalopram  (LEXAPRO ) 5 MG tablet Take 5 mg by mouth daily.     levothyroxine  (SYNTHROID , LEVOTHROID) 25 MCG tablet Take 25 mcg by mouth daily before breakfast.      ondansetron  (ZOFRAN ) 8 MG tablet Take 1 tablet (8 mg total) by mouth every 8 (eight) hours as needed for nausea or vomiting. 30 tablet 1   ondansetron  (ZOFRAN -ODT) 4 MG disintegrating tablet Take 1 tablet (4 mg  total) by mouth every 8 (eight) hours as needed for nausea or vomiting. 20 tablet 0   prochlorperazine  (COMPAZINE ) 10 MG tablet Take 1 tablet (10 mg total) by mouth every 6 (six) hours as needed for nausea or vomiting. 30 tablet 1   triamcinolone  (KENALOG ) 0.025 % ointment Apply 1 Application topically 2 (two) times daily. Apply to eczematoid rash on lower extremities 30 g 1   venetoclax  (VENCLEXTA ) 10 & 50 & 100 MG Starter Pack Take by mouth daily. Take 20 mg for 7 days, then 50 mg daily x 7d, then 100 mg daily x 7d, then 200 mg daily x 7d. Take with food & water. 42 tablet 0   No current facility-administered medications for this visit.    PHYSICAL EXAMINATION: ECOG PERFORMANCE STATUS: 1 - Symptomatic but completely ambulatory VITALS: Vitals:   04/02/24 0921  BP: 139/72  Pulse: 72  Resp: 20  Temp: (!) 97.3 F (36.3 C)  SpO2: 98%   Filed Weights   04/02/24 0921  Weight: 174 lb 9.6 oz (79.2 kg)   Body mass index is 27.76 kg/m.  GENERAL: alert, in no acute distress and comfortable SKIN: no acute rashes, no significant lesions EYES: conjunctiva are pink and non-injected, sclera anicteric OROPHARYNX: MMM, no exudates, no oropharyngeal erythema or ulceration NECK: supple, no JVD LYMPH:  no palpable lymphadenopathy in the cervical, axillary or inguinal regions LUNGS: clear to auscultation b/l with normal respiratory effort HEART: regular rate & rhythm ABDOMEN:  normoactive bowel sounds , non tender, not distended, no hepatosplenomegaly, (-) improvement in mass on right flank Extremity: no pedal edema PSYCH: alert & oriented x 3 with fluent speech NEURO: no focal motor/sensory deficits  LABORATORY DATA:   I have reviewed the data as listed     Latest Ref Rng & Units 04/02/2024    8:08 AM 03/05/2024    9:02 AM 02/06/2024    9:08 AM  CBC EXTENDED  WBC 4.0 - 10.5 K/uL 3.8  3.4  2.4   RBC 3.87 - 5.11 MIL/uL 3.82  3.24  2.55   Hemoglobin 12.0 - 15.0 g/dL 87.8  89.1  8.7   HCT  36.0 - 46.0 % 36.2  32.5  26.5   Platelets 150 - 400 K/uL 227  245  233   NEUT# 1.7 - 7.7 K/uL 1.6  1.7  1.4   Lymph# 0.7 - 4.0 K/uL 1.7  1.2  0.8       Latest Ref Rng & Units 04/02/2024    8:08 AM 03/05/2024    9:02 AM 02/06/2024    9:08 AM  CMP  Glucose 70 - 99 mg/dL 895  882  891   BUN 8 - 23 mg/dL 20  16  13    Creatinine 0.44 - 1.00 mg/dL 8.90  8.95  9.09   Sodium 135 - 145 mmol/L 140  140  141   Potassium 3.5 - 5.1 mmol/L 4.3  4.1  4.1   Chloride 98 - 111 mmol/L 103  106  108   CO2 22 - 32 mmol/L 28  30  29    Calcium 8.9 - 10.3 mg/dL 9.9  9.3  9.5   Total Protein 6.5 - 8.1 g/dL 6.1  5.7  5.6   Total Bilirubin 0.0 - 1.2 mg/dL 0.4  0.4  0.6   Alkaline Phos 38 - 126 U/L 89  82  77   AST 15 - 41 U/L 21  24  13    ALT 0 - 44 U/L 17  24  11       RADIOGRAPHIC STUDIES: I have personally reviewed the radiological images as listed and agreed with the findings in the report. CT Chest W Contrast Result Date: 01/16/2024 CLINICAL DATA:  Chronic lymphocytic leukemia. LEFT anterior rib pain. Concern for malignancy. * Tracking Code: BO * EXAM: CT CHEST WITH CONTRAST TECHNIQUE: Multidetector CT imaging of the chest was performed during intravenous contrast administration. RADIATION DOSE REDUCTION: This exam was performed according to the departmental dose-optimization program which includes automated exposure control, adjustment of the mA and/or kV according to patient size and/or use of iterative reconstruction technique. CONTRAST:  75mL OMNIPAQUE  IOHEXOL  300 MG/ML  SOLN COMPARISON:  None Available. FINDINGS: Cardiovascular: No significant vascular findings. Normal heart size. No pericardial effusion. Mediastinum/Nodes: No axillary or supraclavicular adenopathy. No mediastinal or hilar adenopathy. No pericardial fluid. Esophagus normal. Lungs/Pleura: Benign appearing pleuroparenchymal thickening of the  LEFT and RIGHT lung apex. No suspicious pulmonary nodules. Limited view of the liver, kidneys,  pancreas are unremarkable. Normal adrenal glands. Upper Abdomen: Limited view of the liver, kidneys, pancreas are unremarkable. Normal adrenal glands. Musculoskeletal: No aggressive lesion identified in LEFT chest wall. No fracture or rib destruction. No soft tissue abnormality identified. IMPRESSION: 1. No significant LEFT chest wall abnormality identified. 2. No suspicious pulmonary nodules. 3. No mediastinal lymphadenopathy. Electronically Signed   By: Jackquline Boxer M.D.   On: 01/16/2024 17:26    ASSESSMENT & PLAN:  78 y.o. female with  1.  CLL presented with lymphocytosis and adenopathy diagnosed in 2019. (High risk with 17p and 11q deletions) Tremendous response since starting treatment, only experiencing mild fatigue and early satiety.    2.  Rheumatoid arthritis: She remains without any recent exacerbation.   3.  Bullous lesions on her upper and lower extremities: Possibly related to skin reaction associated with Irbutinib or the patient autoimmune disorder (RA)- much improved with topical steroids. No recent lesions noted.   PLAN: - Discussed lab results on 04/02/2024 in detail with patient: CBC showed WBC of 3.8K increased from 3.4K, Hemoglobin of 12.1 increased from 10.8, and PLTs of 227K. CMP with Creatinine 1.09 increased from 1.04 and Total Protein 6.1 increased from 5.7.  -patient notes nonotable toxicities from her monthly Gazyva  infusions. Will continue with the same supportive medications. -will schedule c4/c5 and c6 of monthly Gazyva  - Start Venetoclax  with next cycle 4 of Gazyva  treatment per patient preference. -continue Allopurinol  at this time. - no recurrence of skin bullous skin lesions  FOLLOW-UP Plz schedule next 3 cycles of Gazyva  as per integrated scheduling Starting venetoclax  with C4 of treatment. Will need weekly labs between C4 and C5 of treatment. MD/NP visit with C4 of treatment MD visit with C4D15 fot toxicity check  The total time spent in the  appointment was 32 minutes* .  All of the patient's questions were answered and the patient knows to call the clinic with any problems, questions, or concerns.  Emaline Saran MD MS AAHIVMS [facility] Hematology/Oncology Physician [facility]  *Total Encounter Time as defined by the Centers for Medicare and Medicaid Services includes, in addition to the face-to-face time of a patient visit (documented in the note above) non-face-to-face time: obtaining and reviewing outside history, ordering and reviewing medications, tests or procedures, care coordination (communications with other health care professionals or caregivers) and documentation in the medical record.  I,Emily Lagle,acting as a neurosurgeon for Emaline Saran, MD.,have documented all relevant documentation on the behalf of Emaline Saran, MD,as directed by  Emaline Saran, MD while in the presence of Emaline Saran, MD.  I have reviewed the above documentation for accuracy and completeness, and I agree with the above.  Ryman Rathgeber, MD

## 2024-04-01 MED FILL — Dexamethasone Sodium Phosphate Inj 100 MG/10ML: INTRAMUSCULAR | Qty: 2 | Status: AC

## 2024-04-02 ENCOUNTER — Inpatient Hospital Stay: Admitting: Hematology

## 2024-04-02 ENCOUNTER — Inpatient Hospital Stay: Attending: Oncology

## 2024-04-02 ENCOUNTER — Inpatient Hospital Stay

## 2024-04-02 VITALS — BP 129/59 | HR 76 | Temp 97.8°F | Resp 16

## 2024-04-02 DIAGNOSIS — C9110 Chronic lymphocytic leukemia of B-cell type not having achieved remission: Secondary | ICD-10-CM | POA: Insufficient documentation

## 2024-04-02 DIAGNOSIS — Z79899 Other long term (current) drug therapy: Secondary | ICD-10-CM | POA: Insufficient documentation

## 2024-04-02 DIAGNOSIS — R52 Pain, unspecified: Secondary | ICD-10-CM | POA: Insufficient documentation

## 2024-04-02 DIAGNOSIS — Z5112 Encounter for antineoplastic immunotherapy: Secondary | ICD-10-CM | POA: Insufficient documentation

## 2024-04-02 LAB — CBC WITH DIFFERENTIAL (CANCER CENTER ONLY)
Abs Immature Granulocytes: 0.04 K/uL (ref 0.00–0.07)
Basophils Absolute: 0.1 K/uL (ref 0.0–0.1)
Basophils Relative: 2 %
Eosinophils Absolute: 0.2 K/uL (ref 0.0–0.5)
Eosinophils Relative: 6 %
HCT: 36.2 % (ref 36.0–46.0)
Hemoglobin: 12.1 g/dL (ref 12.0–15.0)
Immature Granulocytes: 1 %
Lymphocytes Relative: 45 %
Lymphs Abs: 1.7 K/uL (ref 0.7–4.0)
MCH: 31.7 pg (ref 26.0–34.0)
MCHC: 33.4 g/dL (ref 30.0–36.0)
MCV: 94.8 fL (ref 80.0–100.0)
Monocytes Absolute: 0.2 K/uL (ref 0.1–1.0)
Monocytes Relative: 5 %
Neutro Abs: 1.6 K/uL — ABNORMAL LOW (ref 1.7–7.7)
Neutrophils Relative %: 41 %
Platelet Count: 227 K/uL (ref 150–400)
RBC: 3.82 MIL/uL — ABNORMAL LOW (ref 3.87–5.11)
RDW: 12.9 % (ref 11.5–15.5)
WBC Count: 3.8 K/uL — ABNORMAL LOW (ref 4.0–10.5)
nRBC: 0 % (ref 0.0–0.2)

## 2024-04-02 LAB — CMP (CANCER CENTER ONLY)
ALT: 17 U/L (ref 0–44)
AST: 21 U/L (ref 15–41)
Albumin: 4.1 g/dL (ref 3.5–5.0)
Alkaline Phosphatase: 89 U/L (ref 38–126)
Anion gap: 9 (ref 5–15)
BUN: 20 mg/dL (ref 8–23)
CO2: 28 mmol/L (ref 22–32)
Calcium: 9.9 mg/dL (ref 8.9–10.3)
Chloride: 103 mmol/L (ref 98–111)
Creatinine: 1.09 mg/dL — ABNORMAL HIGH (ref 0.44–1.00)
GFR, Estimated: 52 mL/min — ABNORMAL LOW (ref >60)
Glucose, Bld: 104 mg/dL — ABNORMAL HIGH (ref 70–99)
Potassium: 4.3 mmol/L (ref 3.5–5.1)
Sodium: 140 mmol/L (ref 135–145)
Total Bilirubin: 0.4 mg/dL (ref 0.0–1.2)
Total Protein: 6.1 g/dL — ABNORMAL LOW (ref 6.5–8.1)

## 2024-04-02 MED ORDER — FAMOTIDINE IN NACL 20-0.9 MG/50ML-% IV SOLN
20.0000 mg | Freq: Once | INTRAVENOUS | Status: AC
  Administered 2024-04-02: 20 mg via INTRAVENOUS
  Filled 2024-04-02: qty 50

## 2024-04-02 MED ORDER — SODIUM CHLORIDE 0.9 % IV SOLN
20.0000 mg | Freq: Once | INTRAVENOUS | Status: AC
  Administered 2024-04-02: 20 mg via INTRAVENOUS
  Filled 2024-04-02: qty 20

## 2024-04-02 MED ORDER — MORPHINE SULFATE (PF) 2 MG/ML IV SOLN
2.0000 mg | Freq: Once | INTRAVENOUS | Status: AC
  Administered 2024-04-02: 2 mg via INTRAVENOUS
  Filled 2024-04-02: qty 1

## 2024-04-02 MED ORDER — MONTELUKAST SODIUM 10 MG PO TABS
10.0000 mg | ORAL_TABLET | Freq: Once | ORAL | Status: AC
  Administered 2024-04-02: 10 mg via ORAL
  Filled 2024-04-02: qty 1

## 2024-04-02 MED ORDER — DIPHENHYDRAMINE HCL 50 MG/ML IJ SOLN
50.0000 mg | Freq: Once | INTRAMUSCULAR | Status: AC
  Administered 2024-04-02: 50 mg via INTRAVENOUS
  Filled 2024-04-02: qty 1

## 2024-04-02 MED ORDER — SODIUM CHLORIDE 0.9 % IV SOLN
1000.0000 mg | Freq: Once | INTRAVENOUS | Status: AC
  Administered 2024-04-02: 1000 mg via INTRAVENOUS
  Filled 2024-04-02: qty 40

## 2024-04-02 MED ORDER — SODIUM CHLORIDE 0.9 % IV SOLN: INTRAVENOUS | Status: DC

## 2024-04-02 MED ORDER — ACETAMINOPHEN 325 MG PO TABS
650.0000 mg | ORAL_TABLET | Freq: Once | ORAL | Status: AC
  Administered 2024-04-02: 650 mg via ORAL
  Filled 2024-04-02: qty 2

## 2024-04-02 NOTE — Patient Instructions (Signed)
 [facility] - A DEPT OF [facility]  Discharge Instructions: Thank you for choosing [facility] to provide your oncology and hematology care.   If you have a lab appointment with the Cancer Center, please go directly to the Cancer Center and check in at the registration area.   Wear comfortable clothing and clothing appropriate for easy access to any Portacath or PICC line.   We strive to give you quality time with your provider. You may need to reschedule your appointment if you arrive late (15 or more minutes).  Arriving late affects you and other patients whose appointments are after yours.  Also, if you miss three or more appointments without notifying the office, you may be dismissed from the clinic at the provider's discretion.      For prescription refill requests, have your pharmacy contact our office and allow 72 hours for refills to be completed.    Today you received the following chemotherapy and/or immunotherapy agents Gazyva   To help prevent nausea and vomiting after your treatment, we encourage you to take your nausea medication as directed.  BELOW ARE SYMPTOMS THAT SHOULD BE REPORTED IMMEDIATELY: *FEVER GREATER THAN 100.4 F (38 C) OR HIGHER *CHILLS OR SWEATING *NAUSEA AND VOMITING THAT IS NOT CONTROLLED WITH YOUR NAUSEA MEDICATION *UNUSUAL SHORTNESS OF BREATH *UNUSUAL BRUISING OR BLEEDING *URINARY PROBLEMS (pain or burning when urinating, or frequent urination) *BOWEL PROBLEMS (unusual diarrhea, constipation, pain near the anus) TENDERNESS IN MOUTH AND THROAT WITH OR WITHOUT PRESENCE OF ULCERS (sore throat, sores in mouth, or a toothache) UNUSUAL RASH, SWELLING OR PAIN  UNUSUAL VAGINAL DISCHARGE OR ITCHING   Items with * indicate a potential emergency and should be followed up as soon as possible or go to the Emergency Department if any problems should occur.  Please show the CHEMOTHERAPY ALERT CARD or IMMUNOTHERAPY ALERT  CARD at check-in to the Emergency Department and triage nurse.  Should you have questions after your visit or need to cancel or reschedule your appointment, please contact [facility] - A DEPT [facility]  Dept: [phone]  and follow the prompts.  Office hours are 8:00 a.m. to 4:30 p.m. Monday - Friday. Please note that voicemails left after 4:00 p.m. may not be returned until the following business day.  We are closed weekends and major holidays. You have access to a nurse at all times for urgent questions. Please call the main number to the clinic Dept: [phone] and follow the prompts.   For any non-urgent questions, you may also contact your provider using MyChart. We now offer e-Visits for anyone 76 and older to request care online for non-urgent symptoms. For details visit [email or web address].   Also download the MyChart app! Go to the app store, search "MyChart", open the app, select [place], and log in with your MyChart username and password.

## 2024-04-08 ENCOUNTER — Encounter: Payer: Self-pay | Admitting: Hematology

## 2024-04-23 ENCOUNTER — Other Ambulatory Visit (HOSPITAL_COMMUNITY): Payer: Self-pay

## 2024-04-27 ENCOUNTER — Other Ambulatory Visit (HOSPITAL_COMMUNITY): Payer: Self-pay

## 2024-04-28 ENCOUNTER — Other Ambulatory Visit: Payer: Self-pay | Admitting: Family Medicine

## 2024-04-28 ENCOUNTER — Other Ambulatory Visit: Payer: Self-pay

## 2024-04-28 DIAGNOSIS — Z1231 Encounter for screening mammogram for malignant neoplasm of breast: Secondary | ICD-10-CM

## 2024-04-28 NOTE — Progress Notes (Signed)
 Specialty Pharmacy Initial Fill Coordination Note  Kelcey Korus is a 77 y.o. female contacted today regarding initial fill of specialty medication(s) Venetoclax  (VENCLEXTA )   Patient requested Delivery   Delivery date: 04/30/24   Verified address: [address] [place], [place] [address]   Medication will be filled on: 04/29/24   Patient is aware of $0 copayment.

## 2024-04-29 NOTE — Progress Notes (Unsigned)
 "   HEMATOLOGY/ONCOLOGY CLINIC NOTE  Date of Service: 04/30/2024  Patient Care Team: Claudene Pellet, MD as PCP - General (Family Medicine) Jesus Oliphant, MD as Consulting Physician (Otolaryngology)  CHIEF COMPLAINTS/PURPOSE OF CONSULTATION:  Follow-up for continued valuation and management of CLL and follow-up for biopsy of right flank mass  Prior therapy:    She is status post rituximab  weekly for 4 weeks completed on August 21, 2018. She is status post thoracentesis completed on August 12, 2019 with positive cytology. Rituximab  375 mg per metered square weekly started on October 13 of 2020 and completed on 03/04/2019.  This will be repeated every 3 months last treatment completed in March 2021. Bendamustine  and rituximab  started on Sep 03, 2019.  She is here for cycle 3 of therapy.   INTERVAL HISTORY: Nicole Campbell is a 78 y.o. female who is here for continued evaluation and management of her CLL. She is due for Cycle 4, Day 1 of Gazyva  today.   Nicole Campbell reports her appetite and energy are fairly stable.  She is able to complete her ADLs on her own.  She has noticed that her abdominal wall mass is getting larger and extending to her back.  She denies nausea, vomiting or bowel habit changes.  She denies easy bruising or overt signs of bleeding.  She  reports that she is not drinking as much water as she normally does.  She has noticed left calf swelling today without pain or erythema.  She denies fevers, chills, night sweats, shortness of breath, chest pain and cough.  She has no other complaints.   MEDICAL HISTORY:  Past Medical History:  Diagnosis Date   CLL (chronic lymphocytic leukemia) (HCC)    Hypercalcemia    Hypertension    Hypothyroid    MVP (mitral valve prolapse)    Rheumatoid arthritis (HCC)    Shingles     SURGICAL HISTORY: Past Surgical History:  Procedure Laterality Date   ABDOMINAL HYSTERECTOMY     IR THORACENTESIS ASP PLEURAL SPACE W/IMG GUIDE   11/21/2019   IR THORACENTESIS ASP PLEURAL SPACE W/IMG GUIDE  07/05/2020   TONSILLECTOMY      SOCIAL HISTORY: Social History   Socioeconomic History   Marital status: Married    Spouse name: Not on file   Number of children: 3   Years of education: Not on file   Highest education level: Not on file  Occupational History   Not on file  Tobacco Use   Smoking status: Never   Smokeless tobacco: Never  Vaping Use   Vaping status: Never Used  Substance and Sexual Activity   Alcohol use: Yes    Comment: Rare   Drug use: Never   Sexual activity: Not on file  Other Topics Concern   Not on file  Social History Narrative   Not on file   Social Drivers of Health   Tobacco Use: Low Risk (04/27/2023)   Patient History    Smoking Tobacco Use: Never    Smokeless Tobacco Use: Never    Passive Exposure: Not on file  Financial Resource Strain: Not on file  Food Insecurity: No Food Insecurity (03/16/2023)   Hunger Vital Sign    Worried About Running Out of Food in the Last Year: Never true    Ran Out of Food in the Last Year: Never true  Transportation Needs: No Transportation Needs (03/16/2023)   PRAPARE - Administrator, Civil Service (Medical): No    Lack  of Transportation (Non-Medical): No  Physical Activity: Not on file  Stress: Not on file  Social Connections: Not on file  Intimate Partner Violence: Not At Risk (03/16/2023)   Humiliation, Afraid, Rape, and Kick questionnaire    Fear of Current or Ex-Partner: No    Emotionally Abused: No    Physically Abused: No    Sexually Abused: No  Depression (PHQ2-9): Low Risk (04/02/2024)   Depression (PHQ2-9)    PHQ-2 Score: 0  Alcohol Screen: Not on file  Housing: Low Risk (03/16/2023)   Housing    Last Housing Risk Score: 0  Utilities: Not At Risk (03/16/2023)   AHC Utilities    Threatened with loss of utilities: No  Health Literacy: Not on file    FAMILY HISTORY: Family History  Problem Relation Age of Onset    Congestive Heart Failure Father     ALLERGIES:  is allergic to gazyva  [obinutuzumab ], methotrexate, and erythromycin.  MEDICATIONS:  Current Outpatient Medications  Medication Sig Dispense Refill   acetaminophen  (TYLENOL ) 650 MG CR tablet Take 1,300 mg by mouth every 8 (eight) hours as needed for pain.     acyclovir  (ZOVIRAX ) 400 MG tablet Take 1 tablet (400 mg total) by mouth daily. 30 tablet 5   allopurinol  (ZYLOPRIM ) 100 MG tablet Take 1 tablet (100 mg total) by mouth daily. 60 tablet 2   escitalopram  (LEXAPRO ) 5 MG tablet Take 5 mg by mouth daily.     levothyroxine  (SYNTHROID , LEVOTHROID) 25 MCG tablet Take 25 mcg by mouth daily before breakfast.      ondansetron  (ZOFRAN ) 8 MG tablet Take 1 tablet (8 mg total) by mouth every 8 (eight) hours as needed for nausea or vomiting. 30 tablet 1   ondansetron  (ZOFRAN -ODT) 4 MG disintegrating tablet Take 1 tablet (4 mg total) by mouth every 8 (eight) hours as needed for nausea or vomiting. 20 tablet 0   prochlorperazine  (COMPAZINE ) 10 MG tablet Take 1 tablet (10 mg total) by mouth every 6 (six) hours as needed for nausea or vomiting. 30 tablet 1   triamcinolone  (KENALOG ) 0.025 % ointment Apply 1 Application topically 2 (two) times daily. Apply to eczematoid rash on lower extremities 30 g 1   venetoclax  (VENCLEXTA ) 10 & 50 & 100 MG Starter Pack Take by mouth daily. Take 20 mg for 7 days, then 50 mg daily x 7d, then 100 mg daily x 7d, then 200 mg daily x 7d. Take with food & water. 42 tablet 0   No current facility-administered medications for this visit.    REVIEW OF SYSTEMS:   10 Point review of Systems was done is negative except as noted above.  PHYSICAL EXAMINATION: ECOG PERFORMANCE STATUS: 1 - Symptomatic but completely ambulatory  GENERAL:alert, in no acute distress and comfortable SKIN: no acute rashes, no significant lesions EYES: conjunctiva are pink and non-injected, sclera anicteric LUNGS: clear to auscultation b/l with normal  respiratory effort HEART: regular rate & rhythm ABDOMEN: Palpable abdominal wall mass that extends to the right mid back.  Extremity:  Left calf swelling without tenderness or erythema.   PSYCH: alert & oriented x 3 with fluent speech NEURO: no focal motor/sensory deficits   LABORATORY DATA:  I have reviewed the data as listed  .    Latest Ref Rng & Units 04/02/2024    8:08 AM 03/05/2024    9:02 AM 02/06/2024    9:08 AM  CBC  WBC 4.0 - 10.5 K/uL 3.8  3.4  2.4   Hemoglobin 12.0 -  15.0 g/dL 87.8  89.1  8.7   Hematocrit 36.0 - 46.0 % 36.2  32.5  26.5   Platelets 150 - 400 K/uL 227  245  233    .    Latest Ref Rng & Units 04/02/2024    8:08 AM 03/05/2024    9:02 AM 02/06/2024    9:08 AM  CMP  Glucose 70 - 99 mg/dL 895  882  891   BUN 8 - 23 mg/dL 20  16  13    Creatinine 0.44 - 1.00 mg/dL 8.90  8.95  9.09   Sodium 135 - 145 mmol/L 140  140  141   Potassium 3.5 - 5.1 mmol/L 4.3  4.1  4.1   Chloride 98 - 111 mmol/L 103  106  108   CO2 22 - 32 mmol/L 28  30  29    Calcium 8.9 - 10.3 mg/dL 9.9  9.3  9.5   Total Protein 6.5 - 8.1 g/dL 6.1  5.7  5.6   Total Bilirubin 0.0 - 1.2 mg/dL 0.4  0.4  0.6   Alkaline Phos 38 - 126 U/L 89  82  77   AST 15 - 41 U/L 21  24  13    ALT 0 - 44 U/L 17  24  11     . Lab Results  Component Value Date   LDH 167 01/23/2024   SURGICAL PATHOLOGY CASE: [id number] PATIENT: Nicole Campbell Surgical Pathology Report   Clinical History: left flank mass, Q CLL (tb)   FINAL MICROSCOPIC DIAGNOSIS:  A. 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 Immunohistochemical stains reveal the neoplastic cells are positive for CD20, CD5 and CD23. The larger cells show positivity for cyclin D1. The ki-67 proliferation  index is overall low with increase in focal areas, likely consistent with proliferation centers. No sheets of large cells are noted and transformation to a diffuse large B cell lymphoma is considered much less likely. However, this evaluation is based on the needle core biopsy and the possibility of transformation in other areas cannot be completely excluded. Clinical and imaging correlation is recommended.  Legend: (L) Low (H) High RADIOGRAPHIC STUDIES: I have personally reviewed the radiological images as listed and agreed with the findings in the report. No results found.    ASSESSMENT & PLAN:  Nicole Campbell is a 78 y.o. female who presents for continued management of CLL.   1.  CLL presented with lymphocytosis and adenopathy diagnosed in 2019. 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  Rheumatoid arthritis: She remains without any recent exacerbation.  3.  Bullous lesions on her upper and lower extremities: Possibly related to skin reaction associated with Irbutinib or the patient autoimmune disorder (RA)- much improved with topical steroids.  PLAN: --Due for cycle 4, Day 1 of Gazyva  today --Labs today were reviewed and adequate for treatment. WBC 4.2, Hgb 12.4, Plt 206, creatinine increased to 1.20. LFTS are normal.  --Will arrange for 1 L of NS IV fluids today.  --No prohibitive toxicities so continue with Gazyva  as planned.  --Continue Allopurinol  100 mg p.o. twice daily for TLS prophylaxis --Encouraged patient to drink 1-2 L of water per day --Due to left calf edema, will schedule lower extremity US  to rule out DVT. If no evidence of DVT,  recommend to elevated legs and wear compression stockings. Minimize high salt foods.  --Will obtain CT CAP due to c/o enlarging abdominal wall mass to assess for progression.  --Patient plans to start venetoclax  tomorrow.    FOLLOW-UP: RTC in 4 weeks for labs and follow up prior to Cycle 5, Day 1 of Gazyva .   All of the  patient's questions were answered with apparent satisfaction. The patient knows to call the clinic with any problems, questions or concerns.   I have spent a total of 30 minutes minutes of face-to-face and non-face-to-face time, preparing to see the patient,performing a medically appropriate examination, counseling and educating the patient, documenting clinical information in the electronic health record, independently interpreting results and communicating results to the patient, and care coordination.   Johnston Police PA-C Dept of Hematology and Oncology [facility] Phone: [phone]   "

## 2024-04-30 ENCOUNTER — Inpatient Hospital Stay (HOSPITAL_BASED_OUTPATIENT_CLINIC_OR_DEPARTMENT_OTHER): Admitting: Physician Assistant

## 2024-04-30 ENCOUNTER — Inpatient Hospital Stay

## 2024-04-30 VITALS — BP 137/68 | HR 72 | Temp 97.6°F | Resp 16

## 2024-04-30 VITALS — BP 144/63 | HR 86 | Temp 97.2°F | Resp 20 | Wt 174.8 lb

## 2024-04-30 DIAGNOSIS — C9110 Chronic lymphocytic leukemia of B-cell type not having achieved remission: Secondary | ICD-10-CM | POA: Diagnosis not present

## 2024-04-30 DIAGNOSIS — R222 Localized swelling, mass and lump, trunk: Secondary | ICD-10-CM

## 2024-04-30 DIAGNOSIS — M7989 Other specified soft tissue disorders: Secondary | ICD-10-CM

## 2024-04-30 DIAGNOSIS — Z5112 Encounter for antineoplastic immunotherapy: Secondary | ICD-10-CM | POA: Diagnosis not present

## 2024-04-30 LAB — CBC WITH DIFFERENTIAL (CANCER CENTER ONLY)
Abs Immature Granulocytes: 0.04 K/uL (ref 0.00–0.07)
Basophils Absolute: 0.1 K/uL (ref 0.0–0.1)
Basophils Relative: 1 %
Eosinophils Absolute: 0.2 K/uL (ref 0.0–0.5)
Eosinophils Relative: 4 %
HCT: 36.6 % (ref 36.0–46.0)
Hemoglobin: 12.4 g/dL (ref 12.0–15.0)
Immature Granulocytes: 1 %
Lymphocytes Relative: 42 %
Lymphs Abs: 1.8 K/uL (ref 0.7–4.0)
MCH: 30.8 pg (ref 26.0–34.0)
MCHC: 33.9 g/dL (ref 30.0–36.0)
MCV: 90.8 fL (ref 80.0–100.0)
Monocytes Absolute: 0.1 K/uL (ref 0.1–1.0)
Monocytes Relative: 3 %
Neutro Abs: 2.1 K/uL (ref 1.7–7.7)
Neutrophils Relative %: 49 %
Platelet Count: 206 K/uL (ref 150–400)
RBC: 4.03 MIL/uL (ref 3.87–5.11)
RDW: 13.2 % (ref 11.5–15.5)
Smear Review: NORMAL
WBC Count: 4.2 K/uL (ref 4.0–10.5)
nRBC: 0 % (ref 0.0–0.2)

## 2024-04-30 LAB — CMP (CANCER CENTER ONLY)
ALT: 17 U/L (ref 0–44)
AST: 23 U/L (ref 15–41)
Albumin: 4.1 g/dL (ref 3.5–5.0)
Alkaline Phosphatase: 103 U/L (ref 38–126)
Anion gap: 10 (ref 5–15)
BUN: 18 mg/dL (ref 8–23)
CO2: 28 mmol/L (ref 22–32)
Calcium: 10.6 mg/dL — ABNORMAL HIGH (ref 8.9–10.3)
Chloride: 102 mmol/L (ref 98–111)
Creatinine: 1.2 mg/dL — ABNORMAL HIGH (ref 0.44–1.00)
GFR, Estimated: 46 mL/min — ABNORMAL LOW
Glucose, Bld: 113 mg/dL — ABNORMAL HIGH (ref 70–99)
Potassium: 4.4 mmol/L (ref 3.5–5.1)
Sodium: 140 mmol/L (ref 135–145)
Total Bilirubin: 0.5 mg/dL (ref 0.0–1.2)
Total Protein: 6.1 g/dL — ABNORMAL LOW (ref 6.5–8.1)

## 2024-04-30 MED ORDER — ACETAMINOPHEN 325 MG PO TABS
650.0000 mg | ORAL_TABLET | Freq: Once | ORAL | Status: AC
  Administered 2024-04-30: 650 mg via ORAL
  Filled 2024-04-30: qty 2

## 2024-04-30 MED ORDER — SODIUM CHLORIDE 0.9 % IV SOLN: Freq: Once | INTRAVENOUS | Status: AC

## 2024-04-30 MED ORDER — SODIUM CHLORIDE 0.9 % IV SOLN
20.0000 mg | Freq: Once | INTRAVENOUS | Status: AC
  Administered 2024-04-30: 20 mg via INTRAVENOUS
  Filled 2024-04-30: qty 20

## 2024-04-30 MED ORDER — MORPHINE SULFATE (PF) 2 MG/ML IV SOLN
2.0000 mg | Freq: Once | INTRAVENOUS | Status: AC
  Administered 2024-04-30: 2 mg via INTRAVENOUS
  Filled 2024-04-30: qty 1

## 2024-04-30 MED ORDER — MONTELUKAST SODIUM 10 MG PO TABS
10.0000 mg | ORAL_TABLET | Freq: Once | ORAL | Status: AC
  Administered 2024-04-30: 10 mg via ORAL
  Filled 2024-04-30: qty 1

## 2024-04-30 MED ORDER — DIPHENHYDRAMINE HCL 50 MG/ML IJ SOLN
50.0000 mg | Freq: Once | INTRAMUSCULAR | Status: AC
  Administered 2024-04-30: 50 mg via INTRAVENOUS
  Filled 2024-04-30: qty 1

## 2024-04-30 MED ORDER — SODIUM CHLORIDE 0.9 % IV SOLN
1000.0000 mg | Freq: Once | INTRAVENOUS | Status: AC
  Administered 2024-04-30: 1000 mg via INTRAVENOUS
  Filled 2024-04-30: qty 40

## 2024-04-30 MED ORDER — FAMOTIDINE IN NACL 20-0.9 MG/50ML-% IV SOLN
20.0000 mg | Freq: Once | INTRAVENOUS | Status: AC
  Administered 2024-04-30: 20 mg via INTRAVENOUS
  Filled 2024-04-30: qty 50

## 2024-04-30 MED ORDER — SODIUM CHLORIDE 0.9 % IV SOLN: INTRAVENOUS | Status: DC

## 2024-04-30 NOTE — Patient Instructions (Signed)
 [facility] - A DEPT OF [facility]   Discharge Instructions: Thank you for choosing [facility] to provide your oncology and hematology care.   If you have a lab appointment with the Cancer Center, please go directly to the Cancer Center and check in at the registration area.   Wear comfortable clothing and clothing appropriate for easy access to any Portacath or PICC line.   We strive to give you quality time with your provider. You may need to reschedule your appointment if you arrive late (15 or more minutes).  Arriving late affects you and other patients whose appointments are after yours.  Also, if you miss three or more appointments without notifying the office, you may be dismissed from the clinic at the provider's discretion.      For prescription refill requests, have your pharmacy contact our office and allow 72 hours for refills to be completed.    Today you received the following chemotherapy and/or immunotherapy agents: Obintuzumab (Gazyva )      To help prevent nausea and vomiting after your treatment, we encourage you to take your nausea medication as directed.  BELOW ARE SYMPTOMS THAT SHOULD BE REPORTED IMMEDIATELY: *FEVER GREATER THAN 100.4 F (38 C) OR HIGHER *CHILLS OR SWEATING *NAUSEA AND VOMITING THAT IS NOT CONTROLLED WITH YOUR NAUSEA MEDICATION *UNUSUAL SHORTNESS OF BREATH *UNUSUAL BRUISING OR BLEEDING *URINARY PROBLEMS (pain or burning when urinating, or frequent urination) *BOWEL PROBLEMS (unusual diarrhea, constipation, pain near the anus) TENDERNESS IN MOUTH AND THROAT WITH OR WITHOUT PRESENCE OF ULCERS (sore throat, sores in mouth, or a toothache) UNUSUAL RASH, SWELLING OR PAIN  UNUSUAL VAGINAL DISCHARGE OR ITCHING   Items with * indicate a potential emergency and should be followed up as soon as possible or go to the Emergency Department if any problems should occur.  Please show the CHEMOTHERAPY ALERT CARD or  IMMUNOTHERAPY ALERT CARD at check-in to the Emergency Department and triage nurse.  Should you have questions after your visit or need to cancel or reschedule your appointment, please contact [facility] - A DEPT OF [facility]  Dept: [phone]  and follow the prompts.  Office hours are 8:00 a.m. to 4:30 p.m. Monday - Friday. Please note that voicemails left after 4:00 p.m. may not be returned until the following business day.  We are closed weekends and major holidays. You have access to a nurse at all times for urgent questions. Please call the main number to the clinic Dept: [phone] and follow the prompts.   For any non-urgent questions, you may also contact your provider using MyChart. We now offer e-Visits for anyone 81 and older to request care online for non-urgent symptoms. For details visit [email or web address].   Also download the MyChart app! Go to the app store, search MyChart, open the app, select [facility], and log in with your MyChart username and password.

## 2024-05-01 ENCOUNTER — Encounter: Payer: Self-pay | Admitting: Hematology

## 2024-05-02 ENCOUNTER — Ambulatory Visit (HOSPITAL_COMMUNITY)
Admission: RE | Admit: 2024-05-02 | Discharge: 2024-05-02 | Disposition: A | Source: Ambulatory Visit | Attending: Physician Assistant

## 2024-05-02 ENCOUNTER — Ambulatory Visit (HOSPITAL_COMMUNITY): Admission: RE | Admit: 2024-05-02 | Source: Ambulatory Visit

## 2024-05-02 DIAGNOSIS — R222 Localized swelling, mass and lump, trunk: Secondary | ICD-10-CM | POA: Diagnosis present

## 2024-05-02 DIAGNOSIS — M7989 Other specified soft tissue disorders: Secondary | ICD-10-CM | POA: Diagnosis present

## 2024-05-02 DIAGNOSIS — C9110 Chronic lymphocytic leukemia of B-cell type not having achieved remission: Secondary | ICD-10-CM | POA: Diagnosis present

## 2024-05-02 MED ORDER — IOHEXOL 300 MG/ML  SOLN
100.0000 mL | Freq: Once | INTRAMUSCULAR | Status: AC | PRN
  Administered 2024-05-02: 100 mL via INTRAVENOUS

## 2024-05-15 ENCOUNTER — Other Ambulatory Visit: Payer: Self-pay

## 2024-05-16 NOTE — Progress Notes (Signed)
 Contacted pt per Johnston Police [facility] to : let her know the CT does show enlargement of the abdominal wall mass but Dr. Onesimo recommends to proceed with venetoclax  therapy and then re-assessing. Pt acknowledged information and verbalized understanding.

## 2024-05-21 ENCOUNTER — Ambulatory Visit
Admission: RE | Admit: 2024-05-21 | Discharge: 2024-05-21 | Disposition: A | Source: Ambulatory Visit | Attending: Family Medicine | Admitting: Family Medicine

## 2024-05-21 DIAGNOSIS — Z1231 Encounter for screening mammogram for malignant neoplasm of breast: Secondary | ICD-10-CM

## 2024-05-23 ENCOUNTER — Other Ambulatory Visit: Payer: Self-pay

## 2024-05-27 ENCOUNTER — Other Ambulatory Visit: Payer: Self-pay

## 2024-05-27 DIAGNOSIS — C9110 Chronic lymphocytic leukemia of B-cell type not having achieved remission: Secondary | ICD-10-CM

## 2024-05-27 NOTE — Progress Notes (Addendum)
 "   HEMATOLOGY/ONCOLOGY CLINIC NOTE  Date of Service: 05/28/2024  Patient Care Team: Nicole Pellet, MD as PCP - General (Family Medicine) Nicole Oliphant, MD as Consulting Physician (Otolaryngology)  CHIEF COMPLAINTS/PURPOSE OF CONSULTATION:  Follow-up for continued valuation and management of CLL  Prior therapy:    She is status post rituximab  weekly for 4 weeks completed on August 21, 2018. She is status post thoracentesis completed on August 12, 2019 with positive cytology. Rituximab  375 mg per metered square weekly started on October 13 of 2020 and completed on 03/04/2019.  This will be repeated every 3 months last treatment completed in March 2021. Bendamustine  and rituximab  started on Sep 03, 2019.  She is here for cycle 3 of therapy.   INTERVAL HISTORY: Nicole Campbell is a 79 y.o. female who is here for continued evaluation and management of her CLL. She is due for Cycle 5, Day 1 of Gazyva  today.   Nicole Campbell reports she is doing well with the Venetoclax  she started after her last visit. Her energy and appetite are overall stable. She has very mild nausea without vomiting. She reports the right abdominal mass feels softer and pain is tolerable without pain medication. She denies bowel habit changes. She denies fevers, chills, sweats, shortness of breath, chest pain or cough. She has no other complaints.  MEDICAL HISTORY:  Past Medical History:  Diagnosis Date   CLL (chronic lymphocytic leukemia) (HCC)    Hypercalcemia    Hypertension    Hypothyroid    MVP (mitral valve prolapse)    Rheumatoid arthritis (HCC)    Shingles     SURGICAL HISTORY: Past Surgical History:  Procedure Laterality Date   ABDOMINAL HYSTERECTOMY     IR THORACENTESIS RIGHT ASP PLEURAL SPACE W/IMG GUIDE  11/21/2019   IR THORACENTESIS RIGHT ASP PLEURAL SPACE W/IMG GUIDE  07/05/2020   TONSILLECTOMY      SOCIAL HISTORY: Social History   Socioeconomic History   Marital status: Married    Spouse  name: Not on file   Number of children: 3   Years of education: Not on file   Highest education level: Not on file  Occupational History   Not on file  Tobacco Use   Smoking status: Never   Smokeless tobacco: Never  Vaping Use   Vaping status: Never Used  Substance and Sexual Activity   Alcohol use: Yes    Comment: Rare   Drug use: Never   Sexual activity: Not on file  Other Topics Concern   Not on file  Social History Narrative   Not on file   Social Drivers of Health   Tobacco Use: Low Risk (05/21/2024)   Patient History    Smoking Tobacco Use: Never    Smokeless Tobacco Use: Never    Passive Exposure: Not on file  Financial Resource Strain: Not on file  Food Insecurity: No Food Insecurity (03/16/2023)   Hunger Vital Sign    Worried About Running Out of Food in the Last Year: Never true    Ran Out of Food in the Last Year: Never true  Transportation Needs: No Transportation Needs (03/16/2023)   PRAPARE - Administrator, Civil Service (Medical): No    Lack of Transportation (Non-Medical): No  Physical Activity: Not on file  Stress: Not on file  Social Connections: Not on file  Intimate Partner Violence: Not At Risk (03/16/2023)   Humiliation, Afraid, Rape, and Kick questionnaire    Fear of Current or Ex-Partner:  No    Emotionally Abused: No    Physically Abused: No    Sexually Abused: No  Depression (PHQ2-9): Low Risk (05/28/2024)   Depression (PHQ2-9)    PHQ-2 Score: 0  Alcohol Screen: Not on file  Housing: Low Risk (03/16/2023)   Housing    Last Housing Risk Score: 0  Utilities: Not At Risk (03/16/2023)   AHC Utilities    Threatened with loss of utilities: No  Health Literacy: Not on file    FAMILY HISTORY: Family History  Problem Relation Age of Onset   Congestive Heart Failure Father    Breast cancer Neg Hx     ALLERGIES:  is allergic to gazyva  [obinutuzumab ], methotrexate, and erythromycin.  MEDICATIONS:  Current Outpatient  Medications  Medication Sig Dispense Refill   acetaminophen  (TYLENOL ) 650 MG CR tablet Take 1,300 mg by mouth every 8 (eight) hours as needed for pain.     acyclovir  (ZOVIRAX ) 400 MG tablet Take 1 tablet (400 mg total) by mouth daily. 30 tablet 5   allopurinol  (ZYLOPRIM ) 100 MG tablet Take 1 tablet (100 mg total) by mouth daily. 60 tablet 2   escitalopram  (LEXAPRO ) 5 MG tablet Take 5 mg by mouth daily.     levothyroxine  (SYNTHROID , LEVOTHROID) 25 MCG tablet Take 25 mcg by mouth daily before breakfast.      ondansetron  (ZOFRAN ) 8 MG tablet Take 1 tablet (8 mg total) by mouth every 8 (eight) hours as needed for nausea or vomiting. 30 tablet 1   ondansetron  (ZOFRAN -ODT) 4 MG disintegrating tablet Take 1 tablet (4 mg total) by mouth every 8 (eight) hours as needed for nausea or vomiting. 20 tablet 0   prochlorperazine  (COMPAZINE ) 10 MG tablet Take 1 tablet (10 mg total) by mouth every 6 (six) hours as needed for nausea or vomiting. 30 tablet 1   triamcinolone  (KENALOG ) 0.025 % ointment Apply 1 Application topically 2 (two) times daily. Apply to eczematoid rash on lower extremities 30 g 1   venetoclax  (VENCLEXTA ) 100 MG tablet Take 2 tablets (200 mg total) by mouth daily. Tablets should be swallowed whole with a meal and a full glass of water. Start after completion of starter pack 60 tablet 1   No current facility-administered medications for this visit.   Facility-Administered Medications Ordered in Other Visits  Medication Dose Route Frequency Provider Last Rate Last Admin   0.9 %  sodium chloride  infusion   Intravenous Continuous Nicole Campbell, Nicole Brink, MD       acetaminophen  (TYLENOL ) tablet 650 mg  650 mg Oral Once Nicole Ledee Kishore, MD       dexamethasone  (DECADRON ) 20 mg in sodium chloride  0.9 % 50 mL IVPB  20 mg Intravenous Once Nicole Ke Kishore, MD       diphenhydrAMINE  (BENADRYL ) injection 50 mg  50 mg Intravenous Once Nicole Sample Kishore, MD       famotidine  (PEPCID ) IVPB 20 mg  premix  20 mg Intravenous Once Nicole Rohrbach Kishore, MD       montelukast  (SINGULAIR ) tablet 10 mg  10 mg Oral Once Nicole Hassan Kishore, MD       morphine  (PF) 2 MG/ML injection 2 mg  2 mg Intravenous Once Nicole Campbell Kishore, MD       obinutuzumab  (GAZYVA ) 1,000 mg in sodium chloride  0.9 % 250 mL (3.4483 mg/mL) chemo infusion  1,000 mg Intravenous Once Joachim Carton Kishore, MD        REVIEW OF SYSTEMS:   10 Point review of Systems was  done is negative except as noted above.  PHYSICAL EXAMINATION: ECOG PERFORMANCE STATUS: 1 - Symptomatic but completely ambulatory  GENERAL:alert, in no acute distress and comfortable SKIN: no acute rashes, no significant lesions EYES: conjunctiva are pink and non-injected, sclera anicteric LUNGS: clear to auscultation b/l with normal respiratory effort HEART: regular rate & rhythm ABDOMEN: Palpable abdominal wall mass that extends to the right mid back, feels softer.  Extremity:  Left calf swelling without tenderness or erythema.   PSYCH: alert & oriented x 3 with fluent speech NEURO: no focal motor/sensory deficits   LABORATORY DATA:  I have reviewed the data as listed  .    Latest Ref Rng & Units 05/28/2024    9:21 AM 04/30/2024    8:58 AM 04/02/2024    8:08 AM  CBC  WBC 4.0 - 10.5 K/uL 2.1  4.2  3.8   Hemoglobin 12.0 - 15.0 g/dL 89.4  87.5  87.8   Hematocrit 36.0 - 46.0 % 31.0  36.6  36.2   Platelets 150 - 400 K/uL 239  206  227    .    Latest Ref Rng & Units 05/28/2024    9:21 AM 04/30/2024    8:58 AM 04/02/2024    8:08 AM  CMP  Glucose 70 - 99 mg/dL 880  886  895   BUN 8 - 23 mg/dL 15  18  20    Creatinine 0.44 - 1.00 mg/dL 9.07  8.79  8.90   Sodium 135 - 145 mmol/L 140  140  140   Potassium 3.5 - 5.1 mmol/L 4.1  4.4  4.3   Chloride 98 - 111 mmol/L 104  102  103   CO2 22 - 32 mmol/L 25  28  28    Calcium 8.9 - 10.3 mg/dL 9.4  89.3  9.9   Total Protein 6.5 - 8.1 g/dL 5.9  6.1  6.1   Total Bilirubin 0.0 - 1.2 mg/dL 0.4  0.5  0.4    Alkaline Phos 38 - 126 U/L 105  103  89   AST 15 - 41 U/L 17  23  21    ALT 0 - 44 U/L 15  17  17     . Lab Results  Component Value Date   LDH 166 05/28/2024   SURGICAL PATHOLOGY CASE: [id number] PATIENT: Nicole Campbell Surgical Pathology Report   Clinical History: left flank mass, Q CLL (tb)   FINAL MICROSCOPIC DIAGNOSIS:  A. 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 Immunohistochemical stains reveal the neoplastic cells are positive for CD20, CD5 and CD23. The larger cells show positivity for cyclin D1. The ki-67 proliferation index is overall low with increase in focal areas, likely consistent with proliferation centers. No sheets of large cells are noted and transformation to a diffuse large B cell lymphoma is considered much less likely. However, this evaluation is based on the needle core biopsy and the possibility of transformation in other areas cannot be completely excluded. Clinical and imaging correlation is recommended.  Legend: (L) Low (H) High RADIOGRAPHIC STUDIES: I have personally reviewed the radiological images as listed and agreed with the findings in the report. CT CHEST ABDOMEN PELVIS W CONTRAST Result Date: 05/11/2024 CLINICAL DATA:  Enlarging right flank mass, history of CLL EXAM: CT CHEST, ABDOMEN, AND PELVIS  WITH CONTRAST TECHNIQUE: Multidetector CT imaging of the chest, abdomen and pelvis was performed following the standard protocol during bolus administration of intravenous contrast. RADIATION DOSE REDUCTION: This exam was performed according to the departmental dose-optimization program which includes automated exposure control, adjustment of the mA and/or kV according to patient size and/or use of  iterative reconstruction technique. CONTRAST:  OMNIPAQUE  IOHEXOL  300 MG/ML  SOLN COMPARISON:  01/16/2024, 10/15/2023 FINDINGS: CT CHEST FINDINGS Cardiovascular: The heart is unremarkable without pericardial effusion. No evidence of thoracic aortic aneurysm or dissection. Atherosclerosis of the aorta and coronary vasculature. Mediastinum/Nodes: No enlarged mediastinal, hilar, or axillary lymph nodes. Thyroid  gland, trachea, and esophagus demonstrate no significant findings. Lungs/Pleura: Stable areas of pleural thickening are seen bilaterally, greatest in the lower chest. No acute airspace disease, effusion, or pneumothorax. Central airways are patent. Musculoskeletal: No acute or destructive bony abnormalities. Reconstructed images demonstrate no additional findings. CT ABDOMEN PELVIS FINDINGS Hepatobiliary: No focal liver abnormality is seen. No gallstones, gallbladder wall thickening, or biliary dilatation. Pancreas: Unremarkable. No pancreatic ductal dilatation or surrounding inflammatory changes. Spleen: Normal in size without focal abnormality. Adrenals/Urinary Tract: Stable appearance of the bilateral kidneys. No urinary tract calculi or obstructive uropathy. The adrenals and bladder are unremarkable. Stomach/Bowel: No bowel obstruction or ileus. Normal appendix right lower quadrant. No bowel wall thickening or inflammatory change. Vascular/Lymphatic: Stable atherosclerosis of the aorta. Stable retroperitoneal lymphadenopathy, measuring up to 10 mm in short axis. There is increased soft tissue density adjacent to the bifurcation of the right iliac vessels, measuring up to 14 mm reference image 95/2, extending into the region of the right L5-S1 neural foramen. This may reflect lymphadenopathy and progression of CLL. Reproductive: Status post hysterectomy. No adnexal masses. Other: No free fluid or free intraperitoneal gas. No abdominal wall hernia. Musculoskeletal: Infiltrating soft tissue mass within the  right flank has increased in size, now measuring up to 16.2 x 6.8 by 13.6 cm. This mass extends into the posterolateral thoracic cage and right flank musculature. This represents progression of the patient's known CLL, with previous needle biopsy performed 12/12/2023. There are no acute or destructive bony abnormalities. Reconstructed images demonstrate no additional findings. IMPRESSION: 1. Enlarging infiltrative right flank soft tissue mass compatible with progression of patient's known CLL. 2. New soft tissue adjacent to the right common iliac bifurcation, consistent with lymphadenopathy and progression of CLL. This soft tissue extends into the right L5-S1 neural foramen, and correlation with symptoms of radiculopathy are recommended. 3. Persistent borderline enlarged retroperitoneal adenopathy and bilateral areas of pleural thickening, presumably related to patient's known CLL. 4.  Aortic Atherosclerosis (ICD10-I70.0). Electronically Signed   By: Ozell Daring M.D.   On: 05/11/2024 22:22   VAS US  LOWER EXTREMITY VENOUS (DVT) Result Date: 05/03/2024  Lower Venous DVT Study Patient Name:  Nicole Campbell  Date of Exam:   05/02/2024 Medical Rec #: [id number]            Accession #:    [id number] Date of Birth: 1945/09/06            Patient Gender: F Patient Age:   53 years Exam Location:  [address] Procedure:      VAS US  LOWER EXTREMITY VENOUS (DVT) Referring Phys: JOHNSTON POLICE --------------------------------------------------------------------------------  Indications: Swelling, and Edema.  Risk Factors: Cancer Leukemia. Comparison Study: 08/14/19 Performing Technologist: Garnette Rockers  Examination Guidelines: A complete evaluation includes B-mode imaging, spectral Doppler, color Doppler, and power Doppler as needed of all accessible portions of each vessel. Bilateral testing  is considered an integral part of a complete examination. Limited examinations for reoccurring indications may be performed as  noted. The reflux portion of the exam is performed with the patient in reverse Trendelenburg.  +-----+---------------+---------+-----------+----------+--------------+ RIGHTCompressibilityPhasicitySpontaneityPropertiesThrombus Aging +-----+---------------+---------+-----------+----------+--------------+ CFV  Full           Yes      Yes                                 +-----+---------------+---------+-----------+----------+--------------+   +---------+---------------+---------+-----------+----------+--------------+ LEFT     CompressibilityPhasicitySpontaneityPropertiesThrombus Aging +---------+---------------+---------+-----------+----------+--------------+ CFV      Full           Yes      Yes                                 +---------+---------------+---------+-----------+----------+--------------+ SFJ      Full                                                        +---------+---------------+---------+-----------+----------+--------------+ FV Prox  Full                                                        +---------+---------------+---------+-----------+----------+--------------+ FV Mid   Full           Yes      Yes                                 +---------+---------------+---------+-----------+----------+--------------+ FV DistalFull                                                        +---------+---------------+---------+-----------+----------+--------------+ PFV      Full                                                        +---------+---------------+---------+-----------+----------+--------------+ POP      Full           Yes      Yes                                 +---------+---------------+---------+-----------+----------+--------------+ PTV      Full                    Yes                                 +---------+---------------+---------+-----------+----------+--------------+ PERO     Full  Yes                                  +---------+---------------+---------+-----------+----------+--------------+     Summary: RIGHT: - No evidence of common femoral vein obstruction.   LEFT: - There is no evidence of deep vein thrombosis in the lower extremity.  - No cystic structure found in the popliteal fossa.  *See table(s) above for measurements and observations. Electronically signed by Penne Colorado MD on 05/03/2024 at 10:37:13 AM.    Final       ASSESSMENT & PLAN:  Nicole Campbell is a 79 y.o. female who presents for continued management of CLL.   1.  CLL presented with lymphocytosis and adenopathy diagnosed in 2019. 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  Rheumatoid arthritis: She remains without any recent exacerbation.  3.  Bullous lesions on her upper and lower extremities: Possibly related to skin reaction associated with Irbutinib or the patient autoimmune disorder (RA)- much improved with topical steroids.  PLAN: --Due for cycle 5, Day 1 of Gazyva  today --Labs today were reviewed and adequate for treatment. WBC 2.1, Hgb 10.5, Plt 239, creatinine and LFTs are normal.  --Reviewed CT scan from 05/02/24 that showed enlarging infiltrative right flank soft tissue mass compatible with progression of patient's known CLL.New soft tissue adjacent to the right common iliac bifurcation, consistent with lymphadenopathy and progression of CLL. This soft tissue extends into the right L5-S1 neural foramen.Persistent borderline enlarged retroperitoneal adenopathy and bilateral areas of pleural thickening, presumably related to patient's known CLL. --Although there was progression noted on CT scan, will monitor after starting Venetoclax  therapy. Proceed with treatment without any dose modifications.  --Plan to increase Venetoclax  to 200 mg daily after completion of starter pack.  --Will monitor cytopenias closely and adjust venetoclax  dosing as needed. Neutropenic precautions given to patient.   --Continue Allopurinol  100 mg p.o. twice daily for TLS prophylaxis --Encouraged patient to drink 1-2 L of water per day  FOLLOW-UP: RTC in 4 weeks for labs and follow up prior to Cycle 6, Day 1 of Gazyva .   All of the patient's questions were answered with apparent satisfaction. The patient knows to call the clinic with any problems, questions or concerns.   I have spent a total of 30 minutes minutes of face-to-face and non-face-to-face time, preparing to see the patient,performing a medically appropriate examination, counseling and educating the patient, documenting clinical information in the electronic health record, independently interpreting results and communicating results to the patient, and care coordination.   Johnston Police PA-C Dept of Hematology and Oncology [facility] Phone: [phone]   ADDENDUM  .Patient was Personally and independently interviewed, examined and relevant elements of the history of present illness were reviewed in details and an assessment and plan was created. All elements of the patient's history of present illness , assessment and plan were discussed in details with Johnston Ned. The above documentation reflects our combined findings assessment and plan.   Nicole Saran MD MS  "

## 2024-05-28 ENCOUNTER — Inpatient Hospital Stay: Admitting: Physician Assistant

## 2024-05-28 ENCOUNTER — Other Ambulatory Visit: Payer: Self-pay

## 2024-05-28 ENCOUNTER — Inpatient Hospital Stay: Attending: Oncology

## 2024-05-28 ENCOUNTER — Other Ambulatory Visit (HOSPITAL_COMMUNITY): Payer: Self-pay

## 2024-05-28 ENCOUNTER — Inpatient Hospital Stay

## 2024-05-28 VITALS — BP 138/60 | HR 94 | Temp 97.3°F | Resp 20 | Wt 173.5 lb

## 2024-05-28 VITALS — BP 123/61 | HR 79 | Temp 97.9°F | Resp 16

## 2024-05-28 DIAGNOSIS — Z5112 Encounter for antineoplastic immunotherapy: Secondary | ICD-10-CM | POA: Insufficient documentation

## 2024-05-28 DIAGNOSIS — D702 Other drug-induced agranulocytosis: Secondary | ICD-10-CM

## 2024-05-28 DIAGNOSIS — C9110 Chronic lymphocytic leukemia of B-cell type not having achieved remission: Secondary | ICD-10-CM

## 2024-05-28 DIAGNOSIS — Z79899 Other long term (current) drug therapy: Secondary | ICD-10-CM | POA: Diagnosis not present

## 2024-05-28 DIAGNOSIS — D539 Nutritional anemia, unspecified: Secondary | ICD-10-CM | POA: Diagnosis not present

## 2024-05-28 LAB — CMP (CANCER CENTER ONLY)
ALT: 15 U/L (ref 0–44)
AST: 17 U/L (ref 15–41)
Albumin: 4.1 g/dL (ref 3.5–5.0)
Alkaline Phosphatase: 105 U/L (ref 38–126)
Anion gap: 11 (ref 5–15)
BUN: 15 mg/dL (ref 8–23)
CO2: 25 mmol/L (ref 22–32)
Calcium: 9.4 mg/dL (ref 8.9–10.3)
Chloride: 104 mmol/L (ref 98–111)
Creatinine: 0.92 mg/dL (ref 0.44–1.00)
GFR, Estimated: >60 mL/min
Glucose, Bld: 119 mg/dL — ABNORMAL HIGH (ref 70–99)
Potassium: 4.1 mmol/L (ref 3.5–5.1)
Sodium: 140 mmol/L (ref 135–145)
Total Bilirubin: 0.4 mg/dL (ref 0.0–1.2)
Total Protein: 5.9 g/dL — ABNORMAL LOW (ref 6.5–8.1)

## 2024-05-28 LAB — CBC WITH DIFFERENTIAL (CANCER CENTER ONLY)
Abs Immature Granulocytes: 0 10*3/uL (ref 0.00–0.07)
Basophils Absolute: 0 10*3/uL (ref 0.0–0.1)
Basophils Relative: 1 %
Eosinophils Absolute: 0.1 10*3/uL (ref 0.0–0.5)
Eosinophils Relative: 7 %
HCT: 31 % — ABNORMAL LOW (ref 36.0–46.0)
Hemoglobin: 10.5 g/dL — ABNORMAL LOW (ref 12.0–15.0)
Immature Granulocytes: 0 %
Lymphocytes Relative: 35 %
Lymphs Abs: 0.7 10*3/uL (ref 0.7–4.0)
MCH: 30.3 pg (ref 26.0–34.0)
MCHC: 33.9 g/dL (ref 30.0–36.0)
MCV: 89.6 fL (ref 80.0–100.0)
Monocytes Absolute: 0.1 10*3/uL (ref 0.1–1.0)
Monocytes Relative: 3 %
Neutro Abs: 1.1 10*3/uL — ABNORMAL LOW (ref 1.7–7.7)
Neutrophils Relative %: 54 %
Platelet Count: 239 10*3/uL (ref 150–400)
RBC: 3.46 MIL/uL — ABNORMAL LOW (ref 3.87–5.11)
RDW: 14.8 % (ref 11.5–15.5)
Smear Review: NORMAL
WBC Count: 2.1 10*3/uL — ABNORMAL LOW (ref 4.0–10.5)
nRBC: 0 % (ref 0.0–0.2)

## 2024-05-28 LAB — LACTATE DEHYDROGENASE: LDH: 166 U/L (ref 105–235)

## 2024-05-28 LAB — URIC ACID: Uric Acid, Serum: 4.9 mg/dL (ref 2.5–7.1)

## 2024-05-28 MED ORDER — DIPHENHYDRAMINE HCL 50 MG/ML IJ SOLN
50.0000 mg | Freq: Once | INTRAMUSCULAR | Status: AC
  Administered 2024-05-28: 50 mg via INTRAVENOUS
  Filled 2024-05-28: qty 1

## 2024-05-28 MED ORDER — VENETOCLAX 100 MG PO TABS
200.0000 mg | ORAL_TABLET | Freq: Every day | ORAL | 1 refills | Status: AC
  Filled 2024-05-28: qty 120, 60d supply, fill #0
  Filled 2024-06-02: qty 56, 28d supply, fill #0
  Filled 2024-06-02: qty 120, 30d supply, fill #0

## 2024-05-28 MED ORDER — SODIUM CHLORIDE 0.9 % IV SOLN: INTRAVENOUS | Status: DC

## 2024-05-28 MED ORDER — VENETOCLAX 100 MG PO TABS: 200.0000 mg | ORAL_TABLET | Freq: Every day | ORAL | 1 refills | Status: DC

## 2024-05-28 MED ORDER — MORPHINE SULFATE (PF) 2 MG/ML IV SOLN
2.0000 mg | Freq: Once | INTRAVENOUS | Status: AC
  Administered 2024-05-28: 2 mg via INTRAVENOUS
  Filled 2024-05-28: qty 1

## 2024-05-28 MED ORDER — FAMOTIDINE IN NACL 20-0.9 MG/50ML-% IV SOLN
20.0000 mg | Freq: Once | INTRAVENOUS | Status: AC
  Administered 2024-05-28: 20 mg via INTRAVENOUS
  Filled 2024-05-28: qty 50

## 2024-05-28 MED ORDER — SODIUM CHLORIDE 0.9 % IV SOLN
20.0000 mg | Freq: Once | INTRAVENOUS | Status: AC
  Administered 2024-05-28: 20 mg via INTRAVENOUS
  Filled 2024-05-28: qty 20

## 2024-05-28 MED ORDER — ACETAMINOPHEN 325 MG PO TABS
650.0000 mg | ORAL_TABLET | Freq: Once | ORAL | Status: AC
  Administered 2024-05-28: 650 mg via ORAL
  Filled 2024-05-28: qty 2

## 2024-05-28 MED ORDER — MONTELUKAST SODIUM 10 MG PO TABS
10.0000 mg | ORAL_TABLET | Freq: Once | ORAL | Status: AC
  Administered 2024-05-28: 10 mg via ORAL
  Filled 2024-05-28: qty 1

## 2024-05-28 MED ORDER — SODIUM CHLORIDE 0.9 % IV SOLN
1000.0000 mg | Freq: Once | INTRAVENOUS | Status: AC
  Administered 2024-05-28: 1000 mg via INTRAVENOUS
  Filled 2024-05-28: qty 40

## 2024-05-28 NOTE — Progress Notes (Signed)
 Oncology Pharmacist Encounter  Venetoclax  quantity resulted in only a 15 day supply amount. Modified rx after discussion with MD to do 2 tablets (200mg ) by mouth daily. Quantity 60 with 1 refill.   Dontrail Blackwell, PharmD Hematology/Oncology Clinical Pharmacist [facility] [phone]

## 2024-05-28 NOTE — Patient Instructions (Signed)
 [facility] - A DEPT OF [facility]   Discharge Instructions: Thank you for choosing [facility] to provide your oncology and hematology care.   If you have a lab appointment with the Cancer Center, please go directly to the Cancer Center and check in at the registration area.   Wear comfortable clothing and clothing appropriate for easy access to any Portacath or PICC line.   We strive to give you quality time with your provider. You may need to reschedule your appointment if you arrive late (15 or more minutes).  Arriving late affects you and other patients whose appointments are after yours.  Also, if you miss three or more appointments without notifying the office, you may be dismissed from the clinic at the provider's discretion.      For prescription refill requests, have your pharmacy contact our office and allow 72 hours for refills to be completed.    Today you received the following chemotherapy and/or immunotherapy agents: Obintuzumab (Gazyva )      To help prevent nausea and vomiting after your treatment, we encourage you to take your nausea medication as directed.  BELOW ARE SYMPTOMS THAT SHOULD BE REPORTED IMMEDIATELY: *FEVER GREATER THAN 100.4 F (38 C) OR HIGHER *CHILLS OR SWEATING *NAUSEA AND VOMITING THAT IS NOT CONTROLLED WITH YOUR NAUSEA MEDICATION *UNUSUAL SHORTNESS OF BREATH *UNUSUAL BRUISING OR BLEEDING *URINARY PROBLEMS (pain or burning when urinating, or frequent urination) *BOWEL PROBLEMS (unusual diarrhea, constipation, pain near the anus) TENDERNESS IN MOUTH AND THROAT WITH OR WITHOUT PRESENCE OF ULCERS (sore throat, sores in mouth, or a toothache) UNUSUAL RASH, SWELLING OR PAIN  UNUSUAL VAGINAL DISCHARGE OR ITCHING   Items with * indicate a potential emergency and should be followed up as soon as possible or go to the Emergency Department if any problems should occur.  Please show the CHEMOTHERAPY ALERT CARD or  IMMUNOTHERAPY ALERT CARD at check-in to the Emergency Department and triage nurse.  Should you have questions after your visit or need to cancel or reschedule your appointment, please contact [facility] - A DEPT OF [facility]  Dept: [phone]  and follow the prompts.  Office hours are 8:00 a.m. to 4:30 p.m. Monday - Friday. Please note that voicemails left after 4:00 p.m. may not be returned until the following business day.  We are closed weekends and major holidays. You have access to a nurse at all times for urgent questions. Please call the main number to the clinic Dept: [phone] and follow the prompts.   For any non-urgent questions, you may also contact your provider using MyChart. We now offer e-Visits for anyone 81 and older to request care online for non-urgent symptoms. For details visit [email or web address].   Also download the MyChart app! Go to the app store, search MyChart, open the app, select [facility], and log in with your MyChart username and password.

## 2024-05-30 NOTE — Addendum Note (Signed)
 Addended by: ONESIMO KARST on: 05/30/2024 09:51 AM   Modules accepted: Level of Service

## 2024-06-02 ENCOUNTER — Other Ambulatory Visit: Payer: Self-pay

## 2024-06-02 ENCOUNTER — Encounter: Payer: Self-pay | Admitting: Hematology

## 2024-06-02 NOTE — Progress Notes (Signed)
 Specialty Pharmacy Refill Coordination Note  Nicole Campbell is a 79 y.o. female contacted today regarding refills of specialty medication(s) Venetoclax  (VENCLEXTA )   Patient requested Delivery   Delivery date: 06/05/24   Verified address: [address] [place], [place] [address]   Medication will be filled on: 06/04/24

## 2024-06-02 NOTE — Progress Notes (Signed)
 Specialty Pharmacy Ongoing Clinical Assessment Note  Nicole Campbell is a 79 y.o. female who is being followed by the specialty pharmacy service for RxSp Oncology   Patient's specialty medication(s) reviewed today: Venetoclax  (VENCLEXTA )   Missed doses in the last 4 weeks: 0   Patient/Caregiver did not have any additional questions or concerns.   Therapeutic benefit summary: Patient is achieving benefit   Adverse events/side effects summary: No adverse events/side effects   Patient's therapy is appropriate to: Continue    Goals Addressed             This Visit's Progress    Slow Disease Progression   No change    Patient is initiating therapy. Patient will maintain adherence and be monitored by provider to determine if a change in treatment plan is warranted.           Follow up: 3 months  Silvano LOISE Dolly Specialty Pharmacist

## 2024-06-04 ENCOUNTER — Other Ambulatory Visit: Payer: Self-pay

## 2024-06-25 ENCOUNTER — Inpatient Hospital Stay: Attending: Oncology

## 2024-06-25 ENCOUNTER — Inpatient Hospital Stay

## 2024-06-25 ENCOUNTER — Inpatient Hospital Stay: Admitting: Hematology

## 2024-09-02 ENCOUNTER — Other Ambulatory Visit (HOSPITAL_BASED_OUTPATIENT_CLINIC_OR_DEPARTMENT_OTHER)
